# Patient Record
Sex: Female | Born: 1950
Health system: Southern US, Community
[De-identification: ages and names within clinical notes are randomized; demographics above are authoritative.]

## PROBLEM LIST (undated history)

## (undated) ENCOUNTER — Ambulatory Visit (HOSPITAL_COMMUNITY): Discharge status: Against Medical Advice | Payer: PPO

## (undated) DIAGNOSIS — K219 Gastro-esophageal reflux disease without esophagitis: Secondary | ICD-10-CM

## (undated) DIAGNOSIS — I639 Cerebral infarction, unspecified: Secondary | ICD-10-CM

## (undated) DIAGNOSIS — E119 Type 2 diabetes mellitus without complications: Secondary | ICD-10-CM

## (undated) DIAGNOSIS — E039 Hypothyroidism, unspecified: Secondary | ICD-10-CM

## (undated) DIAGNOSIS — I10 Essential (primary) hypertension: Secondary | ICD-10-CM

## (undated) DIAGNOSIS — F32A Depression, unspecified: Secondary | ICD-10-CM

## (undated) DIAGNOSIS — E109 Type 1 diabetes mellitus without complications: Secondary | ICD-10-CM

## (undated) DIAGNOSIS — G4733 Obstructive sleep apnea (adult) (pediatric): Secondary | ICD-10-CM

## (undated) DIAGNOSIS — F329 Major depressive disorder, single episode, unspecified: Secondary | ICD-10-CM

## (undated) HISTORY — DX: Gastro-esophageal reflux disease without esophagitis: K21.9

## (undated) HISTORY — DX: Depression, unspecified: F32.A

## (undated) HISTORY — PX: HAND SURGERY: SHX662

## (undated) HISTORY — PX: TIBIALIS TENDON TRANSFER / REPAIR: SHX6630

## (undated) HISTORY — DX: Obstructive sleep apnea (adult) (pediatric): G47.33

## (undated) HISTORY — PX: APPENDECTOMY: SHX54

## (undated) HISTORY — DX: Cerebral infarction, unspecified: I63.9

## (undated) HISTORY — DX: Major depressive disorder, single episode, unspecified: F32.9

## (undated) HISTORY — PX: JOINT REPLACEMENT: SHX530

## (undated) HISTORY — PX: TOTAL SHOULDER REPLACEMENT: SUR1217

---

## 1998-04-04 ENCOUNTER — Inpatient Hospital Stay (HOSPITAL_COMMUNITY): Admission: AD | Admit: 1998-04-04 | Discharge: 1998-04-14 | Payer: Self-pay | Admitting: *Deleted

## 1998-11-25 ENCOUNTER — Emergency Department (HOSPITAL_COMMUNITY): Admission: EM | Admit: 1998-11-25 | Discharge: 1998-11-25 | Payer: Self-pay | Admitting: Emergency Medicine

## 1998-11-27 ENCOUNTER — Encounter (HOSPITAL_COMMUNITY): Admission: RE | Admit: 1998-11-27 | Discharge: 1999-02-25 | Payer: Self-pay | Admitting: Specialist

## 1998-11-28 ENCOUNTER — Inpatient Hospital Stay (HOSPITAL_COMMUNITY): Admission: RE | Admit: 1998-11-28 | Discharge: 1998-12-02 | Payer: Self-pay | Admitting: Specialist

## 1999-10-06 ENCOUNTER — Inpatient Hospital Stay (HOSPITAL_COMMUNITY): Admission: EM | Admit: 1999-10-06 | Discharge: 1999-10-11 | Payer: Self-pay | Admitting: *Deleted

## 2011-05-21 ENCOUNTER — Inpatient Hospital Stay (HOSPITAL_COMMUNITY)
Admission: RE | Admit: 2011-05-21 | Discharge: 2011-05-23 | DRG: 219 | Disposition: A | Discharge status: Home / Self Care | Payer: BC Managed Care – PPO | Source: Ambulatory Visit | Attending: Orthopedic Surgery | Admitting: Orthopedic Surgery

## 2011-05-21 ENCOUNTER — Ambulatory Visit (HOSPITAL_COMMUNITY): Payer: BC Managed Care – PPO

## 2011-05-21 DIAGNOSIS — F341 Dysthymic disorder: Secondary | ICD-10-CM | POA: Diagnosis present

## 2011-05-21 DIAGNOSIS — S8253XA Displaced fracture of medial malleolus of unspecified tibia, initial encounter for closed fracture: Secondary | ICD-10-CM | POA: Diagnosis present

## 2011-05-21 DIAGNOSIS — E109 Type 1 diabetes mellitus without complications: Secondary | ICD-10-CM | POA: Diagnosis present

## 2011-05-21 DIAGNOSIS — E039 Hypothyroidism, unspecified: Secondary | ICD-10-CM | POA: Diagnosis present

## 2011-05-21 DIAGNOSIS — Z96659 Presence of unspecified artificial knee joint: Secondary | ICD-10-CM

## 2011-05-21 DIAGNOSIS — Y9301 Activity, walking, marching and hiking: Secondary | ICD-10-CM

## 2011-05-21 DIAGNOSIS — Z9641 Presence of insulin pump (external) (internal): Secondary | ICD-10-CM

## 2011-05-21 DIAGNOSIS — S82209A Unspecified fracture of shaft of unspecified tibia, initial encounter for closed fracture: Principal | ICD-10-CM | POA: Diagnosis present

## 2011-05-21 DIAGNOSIS — M810 Age-related osteoporosis without current pathological fracture: Secondary | ICD-10-CM | POA: Diagnosis present

## 2011-05-21 DIAGNOSIS — I10 Essential (primary) hypertension: Secondary | ICD-10-CM | POA: Diagnosis present

## 2011-05-21 DIAGNOSIS — Z794 Long term (current) use of insulin: Secondary | ICD-10-CM

## 2011-05-21 DIAGNOSIS — X58XXXA Exposure to other specified factors, initial encounter: Secondary | ICD-10-CM

## 2011-05-21 LAB — BASIC METABOLIC PANEL
BUN: 16 mg/dL (ref 6–23)
Chloride: 91 mEq/L — ABNORMAL LOW (ref 96–112)
GFR calc Af Amer: >60 mL/min (ref >60)
GFR calc non Af Amer: >60 mL/min (ref >60)
Glucose, Bld: 317 mg/dL — ABNORMAL HIGH (ref 70–99)
Potassium: 4.5 mEq/L (ref 3.5–5.1)
Sodium: 128 mEq/L — ABNORMAL LOW (ref 135–145)

## 2011-05-21 LAB — SURGICAL PCR SCREEN
MRSA, PCR: NEGATIVE
Staphylococcus aureus: POSITIVE — AB

## 2011-05-21 LAB — GLUCOSE, CAPILLARY
Glucose-Capillary: 340 mg/dL — ABNORMAL HIGH (ref 70–99)
Glucose-Capillary: 457 mg/dL — ABNORMAL HIGH (ref 70–99)
Glucose-Capillary: 319 mg/dL — ABNORMAL HIGH (ref 70–99)

## 2011-05-21 LAB — CBC
Hemoglobin: 12.9 g/dL (ref 12.0–15.0)
MCH: 31.9 pg (ref 26.0–34.0)
MCV: 89.1 fL (ref 78.0–100.0)
RBC: 4.04 MIL/uL (ref 3.87–5.11)
WBC: 8.8 10*3/uL (ref 4.0–10.5)

## 2011-05-22 LAB — GLUCOSE, CAPILLARY
Glucose-Capillary: 116 mg/dL — ABNORMAL HIGH (ref 70–99)
Glucose-Capillary: 211 mg/dL — ABNORMAL HIGH (ref 70–99)
Glucose-Capillary: 35 mg/dL — CL (ref 70–99)
Glucose-Capillary: 405 mg/dL — ABNORMAL HIGH (ref 70–99)
Glucose-Capillary: 407 mg/dL — ABNORMAL HIGH (ref 70–99)
Glucose-Capillary: 450 mg/dL — ABNORMAL HIGH (ref 70–99)
Glucose-Capillary: 456 mg/dL — ABNORMAL HIGH (ref 70–99)
Glucose-Capillary: 487 mg/dL — ABNORMAL HIGH (ref 70–99)

## 2011-05-22 LAB — CBC
Hemoglobin: 11.1 g/dL — ABNORMAL LOW (ref 12.0–15.0)
MCH: 30.7 pg (ref 26.0–34.0)
MCHC: 34 g/dL (ref 30.0–36.0)
Platelets: 349 10*3/uL (ref 150–400)
RDW: 11.9 % (ref 11.5–15.5)

## 2011-05-22 LAB — BASIC METABOLIC PANEL
BUN: 12 mg/dL (ref 6–23)
Calcium: 8.6 mg/dL (ref 8.4–10.5)
Chloride: 102 mEq/L (ref 96–112)
Creatinine, Ser: 0.76 mg/dL (ref 0.50–1.10)
GFR calc Af Amer: >60 mL/min (ref >60)
GFR calc non Af Amer: >60 mL/min (ref >60)

## 2011-05-22 LAB — MAGNESIUM: Magnesium: 2.1 mg/dL (ref 1.5–2.5)

## 2011-05-22 LAB — HEMOGLOBIN A1C: Mean Plasma Glucose: 220 mg/dL — ABNORMAL HIGH (ref <117)

## 2011-05-23 LAB — CBC
Hemoglobin: 11.2 g/dL — ABNORMAL LOW (ref 12.0–15.0)
MCH: 31 pg (ref 26.0–34.0)
MCHC: 34.3 g/dL (ref 30.0–36.0)

## 2011-05-23 LAB — BASIC METABOLIC PANEL
BUN: 6 mg/dL (ref 6–23)
Calcium: 9.1 mg/dL (ref 8.4–10.5)
GFR calc non Af Amer: >60 mL/min (ref >60)
Glucose, Bld: 129 mg/dL — ABNORMAL HIGH (ref 70–99)
Sodium: 137 mEq/L (ref 135–145)

## 2011-05-23 LAB — GLUCOSE, CAPILLARY: Glucose-Capillary: 131 mg/dL — ABNORMAL HIGH (ref 70–99)

## 2011-05-30 NOTE — Consult Note (Signed)
NAME:  [name], FISCUS NO.:  [id number]  MEDICAL RECORD NO.:  [id number]  LOCATION:  5022                         FACILITY:  [facility]  PHYSICIAN:  Jonny Ruiz, MD    DATE OF BIRTH:  [birth year]  DATE OF CONSULTATION: DATE OF DISCHARGE:                                CONSULTATION   REFERRING PHYSICIAN:  Toni Arthurs, MD  PRIMARY CARE PHYSICIAN:  Dr.  Brent Bulla in [place], [place].  REASON FOR CONSULTATION:  Management of diabetes.  HISTORY OF PRESENT ILLNESS:  The patient is a 60 year old white female with history of diabetes mellitus type 1, managed by an insulin pump at home, with last hemoglobin A1c of 9.1 a couple of weeks ago, who was admitted to the hospital for ORIF of left distal tibia.  Preoperatively, the patient was noted to have hyperglycemia with ranging between 300 and up to 499.  The patient feels well and voices no complaints.  She states that she received an intraarticular corticosteroid injection to the right knee which has made her glucose worse.  She contacted her general practitioner who asked her to double the dose of insulin coverage.  PAST MEDICAL HISTORY: 1. Diabetes mellitus type 1, on an insulin pump for the last 50 years. 2. Hypertension. 3. Hypothyroidism. 4. Depression with anxiety.  CURRENT MEDICATIONS: 1. Amlodipine 5 mg daily. 2. Cefazolin 1 g IV q.6. 3. Colace 100 mg p.o. b.i.d. 4. Dilaudid p.r.n. 5. Insulin pump. 6. Synthroid 125 mcg a day. 7. Lisinopril 20 mg b.i.d. 8. Senna 1 tablet b.i.d. 9. Effexor 75 mg daily. 10.Acetaminophen. 11.Ativan 0.5 mg p.o. b.i.d. p.r.n. 12.Reglan 5 mg p.o. t.i.d. p.r.n. 13.Oxycodone 10 mg p.o. q.4 p.r.n. 14.Compazine 5-10 mg p.o. q.6 p.r.n.  ALLERGIES:  She has no known drug allergies.  SURGICAL HISTORY:  Appendectomy, bilateral carpal tunnel, right knee arthroscopy, right knee replacement, bilateral cataract surgery.  FAMILY HISTORY:  Noncontributory.  SOCIAL  HISTORY:  The patient is divorced and her 62 year old son lives with her.  She drinks 1 glass of wine a day.  She denies tobacco.  Her last drink was May 19, 2011.  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  REVIEW OF SYSTEMS:  CONSTITUTIONAL:  She denied fever, chills, night sweats.  CARDIOVASCULAR:  She denies chest pain, shortness of breath, or palpitations.  RESPIRATORY:  She denies cough, wheezes, hemoptysis, or trouble breathing.  GASTROINTESTINAL:  Denies abdominal pain, nausea, vomiting, diarrhea, constipation, hematochezia, or melena. GENITOURINARY:  Denies dysuria, frequency, hematuria, or vaginal discharge.  PHYSICAL EXAMINATION:  VITAL SIGNS:  Temperature 98.4, pulse 90, respirations 18, BP 130/70, pulse oximetry 100% on room air. GENERAL APPEARANCE:  The patient is a thin Caucasian female who appears in no acute distress. HEENT:  Unremarkable. NECK:  Supple without JVD or lymphadenopathy. HEART:  Regular S1 and S2 without gallops, murmurs, or rubs. LUNGS:  Clear to auscultation. ABDOMEN:  Soft and nontender without organomegaly or masses palpable. NEUROLOGIC:  Nonfocal. EXTREMITIES:  No clubbing, cyanosis, or edema.  Left lower leg is on a cast.  LABORATORY DATA:  Sodium 128, potassium 4.5, chloride 91, CO2 26, glucose 317, BUN 16, creatinine 0.8.  WBC is 8.8, hemoglobin  12.9, hematocrit  36.0, MCV 89, platelet count 382.  EKG, normal sinus rhythm at 89 beats per minute with possible  left atrial enlargement.  There is a small Q-wave in lead III and aVF, possible inferior infarct, age undetermined.  No ST or T wave abnormalities.  Poor R-wave progression.  RECOMMENDATIONS:  This pleasant 60 year old white female with diabetes type 1 on an insulin pump, is exhibiting uncontrolled hyperglycemia due to recent corticosteroid intraarticular injection and the current surgical stress.  Given her slight hypernatremia, we will recommend to hydrate the patient with  normal saline, replace potassium as needed, and continue insulin pump at the current setting.  In addition, she will be covered by insulin sliding scale per protocol.  Thanks for letting me to participate in the care of your patient.          ______________________________ Jonny Ruiz, MD     GL/MEDQ  D:  05/21/2011  T:  05/21/2011  Job:  [id number]  Electronically Signed by Jonny Ruiz MD on 05/30/2011 04:49:27 PM

## 2011-05-31 NOTE — Op Note (Signed)
NAME:  [name], ALTHOUSE NO.:  [id number]  MEDICAL RECORD NO.:  [id number]  LOCATION:  5022                         FACILITY:  [facility]  PHYSICIAN:  Toni Arthurs, MD        DATE OF BIRTH:  1951-03-06  DATE OF PROCEDURE:  05/21/2011 DATE OF DISCHARGE:                              OPERATIVE REPORT   PREOPERATIVE DIAGNOSIS:  Left tibial pilon and left fibular fractures.  POSTOPERATIVE DIAGNOSIS:  Left tibial pilon and left fibular fractures.  PROCEDURES: 1. Open reduction and internal fixation of the left tibial pilon     fracture. 2. Open reduction and internal fixation of left fibular fracture. 3. Open reduction and internal fixation of left medial malleolus     fracture. 4. Intraoperative interpretation of fluoroscopic images greater than 1     hour. 5. Intraoperative stress examination of the left ankle.  SURGEON:  Toni Arthurs, MD  ANESTHESIA:  General.  IV FLUIDS:  See anesthesia record.  ESTIMATED BLOOD LOSS:  Minimal.  TOURNIQUET TIME:  1 hour and 49 minutes at 250 mmHg.  COMPLICATIONS:  None apparent.  DISPOSITION:  Extubated, awake and stable to recovery.  INDICATIONS FOR PROCEDURE:  The patient is a 60 year old female with a past medical history significant for type 1 diabetes.  She fractured her distal tibia and fibula while hiking approximately a week ago.  She has had a CT scan which shows an intra-articular fracture of the distal tibial plafond as well as a fracture of the fibula.  She presents now for open reduction and internal fixation of this tibial pilon fracture and the adjacent fibular fracture.  The CT scan also reveals a medial malleolus fracture that is separate from the other two fractures.  She presents now for open reduction and internal fixation of these three fractures.  She understands the risks and benefits of the procedures as well as the alternative treatment options.  She specifically understands risks of bleeding,  infection, nerve damage, blood clots, need for additional surgery, amputation, and death.  She would like to proceed.  After preoperative consent was obtained and the correct operative site was identified, the patient was brought to the operating room and placed supine on the operating table.  General anesthesia was induced. Preoperative antibiotics were administered.  A surgical time-out was taken.  The left lower extremity was prepped and draped in a standard sterile fashion with a tourniquet on the thigh.  The patient was positioned in a partially lateral decubitus position with all the bony prominences well padded.  A longitudinal incision was marked at the posterolateral aspect of the ankle at the interval between the peroneals and the Achilles.  The extremity was exsanguinated and tourniquet was inflated to 250 mmHg.  Previously marked incision was made and sharp dissection was carried down through the skin.  Blunt dissection was carried down through the subcutaneous tissue taking care to protect the sural nerve.  The peroneus longus tendon was identified.  The fascia overlying the tendon was incised.  The interval between the peroneus longus and flexor hallucis longus was identified.  Blunt dissection was carried down through this interval to the posterior aspect of the tibia. The  fracture site was identified.  Periosteum was incised over the fracture site.  A Freer elevator was inserted in the fracture site and used to mobilize the fracture.  A stab incision was made at the anterior aspect of the ankle just lateral from the tibialis anterior tendon.  A hemostat was used to spread down to the level of the bone.  The Weber tenaculum was then inserted through this incision anteriorly into the posterior incision after the fracture was reduced.  The clamp was applied securing the fracture.  AP and lateral views showed that the fracture was reduced appropriately.  A 1.6-mm threaded  K-wire from the DePuy 4-mm cannulated screw set was selected and was inserted from posterior to anterior across the fracture site.  This was measured, a partially threaded 4.0 cannulated screw was then inserted and was noted to have excellent purchase.  The tenaculum was removed.  A three-hole one-third tubular plate was then placed over the apex of the fracture site.  The proximal hole was drilled and filled with a bicortical 3.5-mm fully-threaded cortical screw.  The distal hole was drilled and filled with a 3.5-mm fully-threaded cortical screw in lag fashion.  Both screws were noted to have excellent purchase.  The center hole was drilled and filled with a 3.5-mm fully-threaded cortical screw as well.  Attention was then turned to the posterior aspect of the fibula.  Just anterior to the peroneals, submuscular dissection was carried down to the level of the fibula.  A full-thickness flap was then elevated off of the fibula laterally.  The fracture was identified.  It was mobilized and cleaned of all hematoma.  The fracture was reduced and held with a lobster claw, a 3.5-mm fully-threaded screw was inserted in lag fashion from posterior to anterior perpendicular to the fracture line.  The one- third tubular plate seven holes in length was then contoured to fit the lateral aspect of the fibula.  It was applied and secured proximally with three fully-threaded bicortical screws.  Distally, it was secured with three unicortical fully-threaded 3.5-mm cortical screws.  The distal-most two screws were placed through stab incisions and the third most distal screw was placed through the primary incision.  All three were noted to have appropriate purchase.  At this point, mortise view was obtained.  The fracture was noted to be reduced appropriately.  The screws were noted to be of the appropriate length.  A mortise view was obtained, and under live fluoroscopy, dorsiflexion with  external rotation stress was applied to the ankle.  There was no displacement noted at the syndesmosis or widening at the medial clear space.  At this point, attention was then turned to the medial malleolus fracture.  A 1.6-mm threaded K-wire was inserted through the tip of the medial malleolus and across the fracture site.  A stab incision was made over this pin.  The tip of the medial malleolus was drilled with a 2.9- mm cannulated drill bit.  A 44 mm x 4-mm partially threaded cannulated screw was then inserted through the tip of the medial malleolus and advanced until it achieved adequate purchase.  The guidewire was removed.  Final AP, mortise, and lateral views of the ankle showed appropriate reduction of all three fractures as well as appropriate position and length of all hardware.  The posterolateral wound was irrigated copiously.  Inverted simple sutures of 3-0 Monocryl were used to close the subcutaneous tissue.  A running 3-0 Prolene suture was used to close the skin  incision.  The remaining incisions were all closed with horizontal mattress sutures of 3-0 Prolene after irrigating the wounds.  Sterile dressings were applied followed by a well-padded short- leg splint.  The tourniquet was released at an hour and forty-nine minutes after application of the splint.  The patient was then awakened from anesthesia and transported to the recovery room in stable condition.  FOLLOWUP PLAN:  The patient will be nonweightbearing on the left lower extremity.  She will be seen by Dr. Jordan Hawks [facility] due to her persistent hyperglycemia intraoperatively.     Toni Arthurs, MD     JH/MEDQ  D:  05/21/2011  T:  05/22/2011  Job:  [id number]  Electronically Signed by Jonny Ruiz Kathleen Tamm  on 05/31/2011 08:11:21 AM

## 2011-05-31 NOTE — Discharge Summary (Signed)
  NAMEMarland Andrews  [name], [name] NO.:  [id number]  MEDICAL RECORD NO.:  [id number]  LOCATION:  5022                         FACILITY:  [facility]  PHYSICIAN:  Toni Arthurs, MD        DATE OF BIRTH:  March 15, 1951  DATE OF ADMISSION:  05/21/2011 DATE OF DISCHARGE:  05/23/2011                              DISCHARGE SUMMARY   ADMISSION DIAGNOSES: 1. Left tibial pilon and fibula displaced fractures. 2. Type 1 diabetes. 3. Osteoporosis. 4. Hypothyroidism. 5. Hypertension. 6. Anxiety. 7. Depression.  DISCHARGE DIAGNOSES: 1. Left distal tibial pilon and fibular fracture, status post open     reduction and internal fixation. 2. Type 1 diabetes. 3. Osteoporosis. 4. Hypothyroidism. 5. Hypertension. 6. Anxiety. 7. Depression.  BRIEF ADMISSION HISTORY:  The patient is a 60 year old female with past medical history significant for type 1 diabetes and hypothyroidism who injured her left ankle hiking approximately a week prior to admission. X-rays and CT scan revealed a left tibial pilon and fibular fracture. She presents on this admission for open reduction and internal fixation of this complex fracture pattern.  HOSPITAL COURSE:  The patient was admitted to hospital on May 21, 2011. She was taken to the operating room the same day where she underwent open reduction and internal fixation of her left tibial pilon fracture as well as her left fibula fracture.  She tolerated these procedures well with the exception of persistent hyperglycemia with blood sugar in the 400s throughout the case.  As a result of this hyperglycemia, a consultation was obtained from the [employer]  She continued having relatively high blood sugars until her insulin pump cannula site was changed.  After that time, her blood sugar normalized back to her baseline.  She remained stable for duration of her hospital stay.  She was discharged to home in stable condition on May 23, 2011.  DISCHARGE CONDITION:  Stable.  WEIGHTBEARING STATUS:  Nonweightbearing on the left lower extremity.  DISCHARGE DIET:  Diabetic diet.  FOLLOWUP PLAN:  The patient will be nonweightbearing on left lower extremity.  She will follow up with me in 2 weeks for suture removal and conversion to have a cast.  She will keep her foot elevated to the toes above the nose as much as possible for the next 3 days.  DISCHARGE MEDICATIONS:  See discharge medication reconciliation.     Toni Arthurs, MD     JH/MEDQ  D:  05/23/2011  T:  05/23/2011  Job:  [id number]  Electronically Signed by Jonny Ruiz Annali Lybrand  on 05/31/2011 08:11:18 AM

## 2014-04-15 DIAGNOSIS — I10 Essential (primary) hypertension: Secondary | ICD-10-CM

## 2014-04-15 DIAGNOSIS — E1159 Type 2 diabetes mellitus with other circulatory complications: Secondary | ICD-10-CM

## 2014-04-15 DIAGNOSIS — I152 Hypertension secondary to endocrine disorders: Secondary | ICD-10-CM | POA: Insufficient documentation

## 2014-04-15 DIAGNOSIS — E119 Type 2 diabetes mellitus without complications: Secondary | ICD-10-CM | POA: Insufficient documentation

## 2014-04-15 HISTORY — DX: Type 2 diabetes mellitus with other circulatory complications: E11.59

## 2014-04-15 HISTORY — DX: Essential (primary) hypertension: I10

## 2014-04-18 DIAGNOSIS — M1710 Unilateral primary osteoarthritis, unspecified knee: Secondary | ICD-10-CM

## 2014-04-18 HISTORY — DX: Unilateral primary osteoarthritis, unspecified knee: M17.10

## 2014-04-20 DIAGNOSIS — G9340 Encephalopathy, unspecified: Secondary | ICD-10-CM | POA: Insufficient documentation

## 2014-04-20 DIAGNOSIS — J9601 Acute respiratory failure with hypoxia: Secondary | ICD-10-CM | POA: Insufficient documentation

## 2014-04-20 HISTORY — DX: Acute respiratory failure with hypoxia: J96.01

## 2015-08-14 DIAGNOSIS — J45909 Unspecified asthma, uncomplicated: Secondary | ICD-10-CM | POA: Insufficient documentation

## 2015-08-14 DIAGNOSIS — J310 Chronic rhinitis: Secondary | ICD-10-CM | POA: Insufficient documentation

## 2015-08-14 DIAGNOSIS — K219 Gastro-esophageal reflux disease without esophagitis: Secondary | ICD-10-CM | POA: Insufficient documentation

## 2015-08-14 HISTORY — DX: Unspecified asthma, uncomplicated: J45.909

## 2015-08-16 ENCOUNTER — Encounter: Payer: Self-pay | Admitting: Allergy and Immunology

## 2015-08-16 ENCOUNTER — Ambulatory Visit (INDEPENDENT_AMBULATORY_CARE_PROVIDER_SITE_OTHER): Payer: BC Managed Care – PPO | Admitting: Allergy and Immunology

## 2015-08-16 VITALS — BP 134/72 | HR 80 | Resp 14

## 2015-08-16 DIAGNOSIS — J3089 Other allergic rhinitis: Secondary | ICD-10-CM | POA: Diagnosis not present

## 2015-08-16 DIAGNOSIS — J387 Other diseases of larynx: Secondary | ICD-10-CM

## 2015-08-16 DIAGNOSIS — J4540 Moderate persistent asthma, uncomplicated: Secondary | ICD-10-CM

## 2015-08-16 DIAGNOSIS — K219 Gastro-esophageal reflux disease without esophagitis: Secondary | ICD-10-CM

## 2015-08-16 MED ORDER — MOMETASONE FUROATE 100 MCG/ACT IN AERO: 2.0000 | INHALATION_SPRAY | Freq: Two times a day (BID) | RESPIRATORY_TRACT | Status: DC

## 2015-08-16 NOTE — Progress Notes (Signed)
FOLLOW UP NOTE  RE: [name] MRN: [id number] Date of Birth: 1951/09/04 [facility] and [place] is a 64 y.o. female with a history of Asthma, LPR, and Allergic rhinitis who was doing well until the past several weeks when she developed cough, especially at night, and also has had some throat clearing and post nasal drip. She has not had any fever or significant rhinitis or ugly nasal discharge or any chest tightness or sob. She continues to drink one coffee per day and one glass of wine per night. All of thee symptoms developed while using her Omeprazole and ranitidine and nasonex. She does not use her SABA  CURRENT MEDICAL TREATMENT  Current outpatient prescriptions:  .  Albuterol Sulfate (PROAIR RESPICLICK) 865 (90 BASE) MCG/ACT AEPB, Inhale 2 puffs into the lungs as needed (for cough or wheeze)., Disp: , Rfl:  .  ALISKIREN-AMLODIPINE PO, Take by mouth., Disp: , Rfl:  .  LEVOTHYROXINE SODIUM PO, Take by mouth., Disp: , Rfl:  .  omeprazole (PRILOSEC) 40 MG capsule, Take 40 mg by mouth daily., Disp: , Rfl:  .  Probiotic Product (PROBIOTIC PO), Take by mouth., Disp: , Rfl:  .  ranitidine (ZANTAC) 300 MG tablet, Take 300 mg by mouth at bedtime., Disp: , Rfl:  .  SIMVASTATIN PO, Take by mouth., Disp: , Rfl:  .  VENLAFAXINE HCL PO, Take by mouth., Disp: , Rfl:  .  mometasone (NASONEX) 50 MCG/ACT nasal spray, Place 1 spray into the nose daily., Disp: , Rfl:  .  Mometasone Furoate (ASMANEX HFA) 100 MCG/ACT AERO, Inhale 2 puffs into the lungs 2 (two) times daily. Rinse, gargle, and spit after use, Disp: 13 g, Rfl: 5  DRUG ALLERGY: Allergies as of 08/16/2015  . (No Known Allergies)    PHYSICAL EXAM: BP 134/72 mmHg  Pulse 80  Resp 14 Physical Exam  Constitutional: She appears well-developed. No distress.  HENT:  Head: Normocephalic.  Right Ear: External ear normal.  Left Ear: External ear normal.  Nose: Nose normal.  Mouth/Throat: Oropharynx is clear and  moist. No oropharyngeal exudate.  Eyes: Conjunctivae are normal. Right eye exhibits no discharge. Left eye exhibits no discharge.  Neck: No JVD present. No tracheal deviation present. No thyromegaly present.  Cardiovascular: Normal rate, regular rhythm and normal heart sounds.  Exam reveals no gallop and no friction rub.   No murmur heard. Pulmonary/Chest: No stridor. No respiratory distress. She has no wheezes. She has no rales. She exhibits no tenderness.  Musculoskeletal: She exhibits no edema.  Lymphadenopathy:    She has no cervical adenopathy.  Skin: She is not diaphoretic.    DIAGNOSTICS: Spirometry was performed and demonstrated a FEV of 1.89 @ 81%  ASSESSMENT AND PLAN:  Asthma LPR Allergic rhinitis   1. Start Asmanex 100 HFA 2 puffs two times per day 2. Start OTC Rhinocort one spray each nostril one time per day 3. Continue Omeprazole and Ranitidine 4. Use the following if needed:  A. OTC antihistamine  B. Proair HFA Respiclick 5. Get flu vaccine 6. Return in 6 weeks or earlier if problem.  I will start Dewana on steroids for both her upper and lower respiratory tract for the next 6 weeks to see if this helps her respiratory symptoms. We will regroup at that point or earlier if there is a problem.

## 2015-08-16 NOTE — Patient Instructions (Signed)
1. Start Asmanex 100 HFA 2 puffs two times per day  2. Start OTC Rhinocort one spray each nostril one time per day  3. Continue Omeprazole and Ranitidine  4. Use the following if needed:  A. OTC antihistamine  B. Proair HFA Respiclick  5. Get flu vaccine  6. Return in 6 weeks or earlier if problem.

## 2015-09-22 ENCOUNTER — Other Ambulatory Visit: Payer: Self-pay | Admitting: *Deleted

## 2015-09-22 MED ORDER — BECLOMETHASONE DIPROPIONATE 80 MCG/ACT IN AERS: INHALATION_SPRAY | RESPIRATORY_TRACT | Status: DC

## 2015-09-22 NOTE — Telephone Encounter (Signed)
Asmanex changed to Qvar 80 2-QD per Dr. Neldon Mc.

## 2015-10-04 ENCOUNTER — Ambulatory Visit: Payer: BC Managed Care – PPO | Admitting: Allergy and Immunology

## 2015-11-06 ENCOUNTER — Other Ambulatory Visit: Payer: Self-pay

## 2015-11-06 MED ORDER — RANITIDINE HCL 300 MG PO TABS: 300.0000 mg | ORAL_TABLET | Freq: Every day | ORAL | Status: DC

## 2015-11-06 MED ORDER — OMEPRAZOLE 40 MG PO CPDR: 40.0000 mg | DELAYED_RELEASE_CAPSULE | Freq: Every day | ORAL | Status: DC

## 2016-01-17 ENCOUNTER — Other Ambulatory Visit: Payer: Self-pay | Admitting: Gastroenterology

## 2016-01-17 DIAGNOSIS — R6881 Early satiety: Secondary | ICD-10-CM

## 2016-01-25 ENCOUNTER — Ambulatory Visit
Admission: RE | Admit: 2016-01-25 | Discharge: 2016-01-25 | Disposition: A | Discharge status: Home / Self Care | Payer: BC Managed Care – PPO | Source: Ambulatory Visit | Attending: Gastroenterology | Admitting: Gastroenterology

## 2016-01-25 DIAGNOSIS — R6881 Early satiety: Secondary | ICD-10-CM | POA: Insufficient documentation

## 2016-01-25 MED ORDER — TECHNETIUM TC 99M SULFUR COLLOID
2.0000 | Freq: Once | INTRAVENOUS | Status: AC | PRN
  Administered 2016-01-25: 2.03 via INTRAVENOUS

## 2016-02-01 ENCOUNTER — Encounter: Payer: Self-pay | Admitting: *Deleted

## 2016-02-02 ENCOUNTER — Encounter
Admission: RE | Disposition: A | Discharge status: Home / Self Care | Payer: Self-pay | Source: Ambulatory Visit | Attending: Gastroenterology

## 2016-02-02 ENCOUNTER — Ambulatory Visit
Admission: RE | Admit: 2016-02-02 | Discharge: 2016-02-02 | Disposition: A | Discharge status: Home / Self Care | Payer: BC Managed Care – PPO | Source: Ambulatory Visit | Attending: Gastroenterology | Admitting: Gastroenterology

## 2016-02-02 ENCOUNTER — Encounter: Payer: Self-pay | Admitting: Anesthesiology

## 2016-02-02 ENCOUNTER — Ambulatory Visit: Payer: BC Managed Care – PPO | Admitting: Anesthesiology

## 2016-02-02 DIAGNOSIS — K2950 Unspecified chronic gastritis without bleeding: Secondary | ICD-10-CM | POA: Diagnosis not present

## 2016-02-02 DIAGNOSIS — Z800 Family history of malignant neoplasm of digestive organs: Secondary | ICD-10-CM | POA: Diagnosis not present

## 2016-02-02 DIAGNOSIS — K449 Diaphragmatic hernia without obstruction or gangrene: Secondary | ICD-10-CM | POA: Insufficient documentation

## 2016-02-02 DIAGNOSIS — I10 Essential (primary) hypertension: Secondary | ICD-10-CM | POA: Diagnosis not present

## 2016-02-02 DIAGNOSIS — E039 Hypothyroidism, unspecified: Secondary | ICD-10-CM | POA: Insufficient documentation

## 2016-02-02 DIAGNOSIS — R140 Abdominal distension (gaseous): Secondary | ICD-10-CM | POA: Diagnosis not present

## 2016-02-02 DIAGNOSIS — Z8371 Family history of colonic polyps: Secondary | ICD-10-CM | POA: Insufficient documentation

## 2016-02-02 DIAGNOSIS — E119 Type 2 diabetes mellitus without complications: Secondary | ICD-10-CM | POA: Insufficient documentation

## 2016-02-02 DIAGNOSIS — Z96659 Presence of unspecified artificial knee joint: Secondary | ICD-10-CM | POA: Diagnosis not present

## 2016-02-02 DIAGNOSIS — R6881 Early satiety: Secondary | ICD-10-CM | POA: Insufficient documentation

## 2016-02-02 HISTORY — DX: Type 2 diabetes mellitus without complications: E11.9

## 2016-02-02 HISTORY — PX: ESOPHAGOGASTRODUODENOSCOPY (EGD) WITH PROPOFOL: SHX5813

## 2016-02-02 HISTORY — DX: Hypothyroidism, unspecified: E03.9

## 2016-02-02 HISTORY — DX: Essential (primary) hypertension: I10

## 2016-02-02 LAB — GLUCOSE, CAPILLARY: Glucose-Capillary: 110 mg/dL — ABNORMAL HIGH (ref 65–99)

## 2016-02-02 SURGERY — ESOPHAGOGASTRODUODENOSCOPY (EGD) WITH PROPOFOL
Anesthesia: General

## 2016-02-02 MED ORDER — PROPOFOL 500 MG/50ML IV EMUL
INTRAVENOUS | Status: DC | PRN
  Administered 2016-02-02: 100 ug/kg/min via INTRAVENOUS

## 2016-02-02 MED ORDER — IPRATROPIUM-ALBUTEROL 0.5-2.5 (3) MG/3ML IN SOLN
3.0000 mL | Freq: Once | RESPIRATORY_TRACT | Status: AC
  Administered 2016-02-02: 3 mL via RESPIRATORY_TRACT

## 2016-02-02 MED ORDER — IPRATROPIUM-ALBUTEROL 0.5-2.5 (3) MG/3ML IN SOLN
RESPIRATORY_TRACT | Status: AC
  Filled 2016-02-02: qty 3

## 2016-02-02 MED ORDER — SODIUM CHLORIDE 0.9 % IV SOLN
INTRAVENOUS | Status: DC
  Administered 2016-02-02: 1000 mL via INTRAVENOUS

## 2016-02-02 NOTE — Anesthesia Postprocedure Evaluation (Signed)
Anesthesia Post Note  Patient: [name]  Procedure(s) Performed: Procedure(s) (LRB): ESOPHAGOGASTRODUODENOSCOPY (EGD) WITH PROPOFOL (N/A)  Patient location during evaluation: Endoscopy Anesthesia Type: General Level of consciousness: awake and alert Pain management: pain level controlled Vital Signs Assessment: post-procedure vital signs reviewed and stable Respiratory status: spontaneous breathing, nonlabored ventilation, respiratory function stable and patient connected to nasal cannula oxygen Cardiovascular status: blood pressure returned to baseline and stable Postop Assessment: no signs of nausea or vomiting Anesthetic complications: no    Last Vitals:  Filed Vitals:   02/02/16 0945 02/02/16 0950  BP: 114/102 154/85  Pulse: 81 81  Temp:    Resp: 13 14    Last Pain: There were no vitals filed for this visit.               Precious Haws [place]

## 2016-02-02 NOTE — Anesthesia Preprocedure Evaluation (Addendum)
Anesthesia Evaluation  Patient identified by MRN, date of birth, ID band Patient awake    Reviewed: Allergy & Precautions, H&P , NPO status , Patient's Chart, lab work & pertinent test results  History of Anesthesia Complications Negative for: history of anesthetic complications  Airway Mallampati: III  TM Distance: >3 FB Neck ROM: limited    Dental  (+) Poor Dentition   Pulmonary neg shortness of breath, asthma ,    Pulmonary exam normal breath sounds clear to auscultation       Cardiovascular Exercise Tolerance: Good hypertension, (-) angina(-) Past MI and (-) DOE Normal cardiovascular exam Rhythm:regular Rate:Normal     Neuro/Psych TIAnegative psych ROS   GI/Hepatic Neg liver ROS, GERD  Controlled,  Endo/Other  diabetes, Type 2Hypothyroidism   Renal/GU negative Renal ROS  negative genitourinary   Musculoskeletal   Abdominal   Peds  Hematology negative hematology ROS (+)   Anesthesia Other Findings Past Medical History:   Diabetes mellitus without complication (HCC)                 Hypertension                                                 Hypothyroidism                                              Past Surgical History:   JOINT REPLACEMENT                                               Comment:Knee    APPENDECTOMY                                                  CESAREAN SECTION                                             BMI    Body Mass Index   24.87 kg/m 2      Reproductive/Obstetrics negative OB ROS                            Anesthesia Physical Anesthesia Plan  ASA: III  Anesthesia Plan: General   Post-op Pain Management:    Induction:   Airway Management Planned:   Additional Equipment:   Intra-op Plan:   Post-operative Plan:   Informed Consent: I have reviewed the patients History and Physical, chart, labs and discussed the procedure including the risks,  benefits and alternatives for the proposed anesthesia with the patient or authorized representative who has indicated his/her understanding and acceptance.   Dental Advisory Given  Plan Discussed with: Anesthesiologist, CRNA and Surgeon  Anesthesia Plan Comments:         Anesthesia Quick Evaluation

## 2016-02-02 NOTE — Transfer of Care (Signed)
Immediate Anesthesia Transfer of Care Note  Patient: [name]  Procedure(s) Performed: Procedure(s): ESOPHAGOGASTRODUODENOSCOPY (EGD) WITH PROPOFOL (N/A)  Patient Location: PACU  Anesthesia Type:General  Level of Consciousness: awake, alert  and oriented  Airway & Oxygen Therapy: Patient Spontanous Breathing and Patient connected to nasal cannula oxygen  Post-op Assessment: Report given to RN and Post -op Vital signs reviewed and stable  Post vital signs: Reviewed and stable  Last Vitals:  Filed Vitals:   02/02/16 0819 02/02/16 0931  BP: 134/76   Pulse: 81 79  Temp: 36 C 36 C  Resp: 16 15    Complications: No apparent anesthesia complications

## 2016-02-02 NOTE — H&P (Signed)
Primary Care Physician:  Rochel Brome, MD  Pre-Procedure History & Physical: HPI:  [name] is a 65 y.o. female is here for an endoscopy.   Past Medical History  Diagnosis Date  . Diabetes mellitus without complication ([place])   . Hypertension   . Hypothyroidism     Past Surgical History  Procedure Laterality Date  . Joint replacement      Knee   . Appendectomy    . Cesarean section      Prior to Admission medications   Medication Sig Start Date End Date Taking? Authorizing Provider  Albuterol Sulfate (PROAIR RESPICLICK) [id number] (90 BASE) MCG/ACT AEPB Inhale 2 puffs into the lungs as needed (for cough or wheeze).   Yes Historical Provider, MD  ALISKIREN-AMLODIPINE PO Take by mouth.   Yes Historical Provider, MD  aspirin EC 81 MG tablet Take 81 mg by mouth daily.   Yes Historical Provider, MD  beclomethasone (QVAR) 80 MCG/ACT inhaler INHALE TWO PUFFS ONCE DAILY TO PREVENT COUGH OR WHEEZE. RINSE, GARGLE, AND SPIT AFTER USE. 09/22/15  Yes Jiles Prows, MD  insulin aspart (NOVOLOG) 100 UNIT/ML injection Inject subcutaneously. SLIDING SCALE   Yes Historical Provider, MD  LEVOTHYROXINE SODIUM PO Take 25 mcg by mouth.    Yes Historical Provider, MD  lisinopril (PRINIVIL,ZESTRIL) 5 MG tablet Take 5 mg by mouth daily.   Yes Historical Provider, MD  LORazepam (ATIVAN) 0.5 MG tablet Take 0.5 mg by mouth. As needed for anxiety   Yes Historical Provider, MD  mometasone (NASONEX) 50 MCG/ACT nasal spray Place 1 spray into the nose daily.   Yes Historical Provider, MD  Mometasone Furoate [facility] HFA) 100 MCG/ACT AERO Inhale 2 puffs into the lungs 2 (two) times daily. Rinse, gargle, and spit after use 08/16/15  Yes Jiles Prows, MD  omeprazole (PRILOSEC) 40 MG capsule Take 1 capsule (40 mg total) by mouth daily. 11/06/15  Yes Jiles Prows, MD  Probiotic Product (PROBIOTIC PO) Take by mouth.   Yes Historical Provider, MD  ranitidine (ZANTAC) 300 MG tablet Take 1 tablet (300 mg total) by mouth at  bedtime. 11/06/15  Yes Jiles Prows, MD  SIMVASTATIN PO Take 5 mg by mouth.    Yes Historical Provider, MD  tolterodine (DETROL LA) 4 MG 24 hr capsule Take 4 mg by mouth daily.   Yes Historical Provider, MD  VENLAFAXINE HCL PO Take 25 mg by mouth.    Yes Historical Provider, MD  Vitamin D, Ergocalciferol, (DRISDOL) 50000 units CAPS capsule Take 50,000 Units by mouth every 7 (seven) days.   Yes Historical Provider, MD  insulin glargine (LANTUS) 100 UNIT/ML injection Inject 15 Units into the skin at bedtime. Reported on 02/02/2016    Historical Provider, MD    Allergies as of 01/18/2016  . (No Known Allergies)    History reviewed. No pertinent family history.  Social History   Social History  . Marital Status: Single    Spouse Name: N/A  . Number of Children: N/A  . Years of Education: N/A   Occupational History  . Not on file.   Social History Main Topics  . Smoking status: Never Smoker   . Smokeless tobacco: Never Used  . Alcohol Use: Not on file  . Drug Use: No  . Sexual Activity: Not on file   Other Topics Concern  . Not on file   Social History Narrative     Physical Exam: BP 134/76 mmHg  Pulse 81  Temp(Src) 96.8 F (36 C) (  Tympanic)  Resp 16  Ht 5\' 4"  (1.626 m)  Wt 65.772 kg (145 lb)  BMI 24.88 kg/m2  SpO2 98% General:   Alert,  pleasant and cooperative in NAD Head:  Normocephalic and atraumatic. Neck:  Supple; no masses or thyromegaly. Lungs:  Clear throughout to auscultation.    Heart:  Regular rate and rhythm. Abdomen:  Soft, nontender and nondistended. Normal bowel sounds, without guarding, and without rebound.   Neurologic:  Alert and  oriented x4;  grossly normal neurologically.  Impression/Plan: [name] is here for an endoscopy to be performed for early satiety, bloating  Risks, benefits, limitations, and alternatives regarding  endoscopy have been reviewed with the patient.  Questions have been answered.  All parties agreeable.   Josefine Class, MD  02/02/2016, 9:03 AM

## 2016-02-02 NOTE — Discharge Instructions (Signed)

## 2016-02-02 NOTE — Op Note (Signed)
[facility] Gastroenterology Patient Name: [name] Procedure Date: 02/02/2016 9:04 AM MRN: [id number] Account #: [id number] Date of Birth: Oct 07, 1951 Admit Type: Outpatient Age: 65 Room: [facility] ENDO ROOM 2 Gender: Female Note Status: Finalized Procedure:            Upper GI endoscopy Indications:          Abdominal bloating, Early satiety( normal gastric                        emptying study) Patient Profile:      This is a 65 year old female. Providers:            Gerrit Heck. Rayann Heman, MD Referring MD:         Franki Monte, MD (Referring MD) Medicines:            Propofol per Anesthesia Complications:        No immediate complications. Procedure:            Pre-Anesthesia Assessment:                       - Prior to the procedure, a History and Physical was                        performed, and patient medications, allergies and                        sensitivities were reviewed. The patient's tolerance of                        previous anesthesia was reviewed.                       After obtaining informed consent, the endoscope was                        passed under direct vision. Throughout the procedure,                        the patient's blood pressure, pulse, and oxygen                        saturations were monitored continuously. The Endoscope                        was introduced through the mouth, and advanced to the                        second part of duodenum. The upper GI endoscopy was                        accomplished without difficulty. The patient tolerated                        the procedure well. Findings:      A small hiatal hernia was present.      The stomach was normal.      The examined duodenum was normal.      Four biopsies were obtained with cold forceps for histology in the       duodenal bulb and in the second portion of the duodenum.  Four biopsies were obtained with cold forceps for histology randomly in       the  gastric body and in the gastric antrum. Impression:           - Small hiatal hernia.                       - Normal stomach.                       - Normal examined duodenum.                       - Four biopsies were obtained in the duodenal bulb and                        in the second portion of the duodenum.                       - Four biopsies were obtained in the gastric body and                        in the gastric antrum. Recommendation:       - Await pathology results.                       - Nutrition consult                       - Low particle and low fodmap diet                       - probiotic                       - The findings and recommendations were discussed with                        the patient.                       - The findings and recommendations were discussed with                        the patient's family.                       - Resume regular diet.                       - Continue present medications. Procedure Code(s):    --- Professional ---                       [phone], Esophagogastroduodenoscopy, flexible, transoral;                        with biopsy, single or multiple Diagnosis Code(s):    --- Professional ---                       K44.9, Diaphragmatic hernia without obstruction or                        gangrene  R14.0, Abdominal distension (gaseous)                       R68.81, Early satiety CPT copyright 2016 American Medical Association. All rights reserved. The codes documented in this report are preliminary and upon coder review may  be revised to meet current compliance requirements. Mellody Life, MD 02/02/2016 9:29:10 AM This report has been signed electronically. Number of Addenda: 0 Note Initiated On: 02/02/2016 9:04 AM      [facility]

## 2016-02-05 LAB — SURGICAL PATHOLOGY

## 2016-02-07 ENCOUNTER — Encounter: Payer: Self-pay | Admitting: Gastroenterology

## 2016-03-05 ENCOUNTER — Ambulatory Visit: Payer: BC Managed Care – PPO | Admitting: Dietician

## 2016-06-20 ENCOUNTER — Ambulatory Visit: Payer: BC Managed Care – PPO | Admitting: Neurology

## 2016-07-03 ENCOUNTER — Ambulatory Visit (INDEPENDENT_AMBULATORY_CARE_PROVIDER_SITE_OTHER): Payer: BC Managed Care – PPO | Admitting: Neurology

## 2016-07-03 ENCOUNTER — Encounter: Payer: Self-pay | Admitting: Neurology

## 2016-07-03 VITALS — BP 112/60 | HR 88 | Temp 98.4°F | Ht 64.0 in | Wt 155.6 lb

## 2016-07-03 DIAGNOSIS — R413 Other amnesia: Secondary | ICD-10-CM

## 2016-07-03 NOTE — Patient Instructions (Addendum)
You look great. Continue with control of diabetes, blood pressure. Physical exercise and brain stimulation exercises (crossword puzzles, word search, etc) are important for brain health. Follow-up in 1 year, call for any changes

## 2016-07-03 NOTE — Progress Notes (Signed)
NEUROLOGY CONSULTATION NOTE  [name] MRN: [id number] DOB: [birth year]  Referring provider: Dr. Rochel Brome Primary care provider: Dr. Rochel Brome  Reason for consult:  Memory loss  Dear Dr Tobie Poet:  Thank you for your kind referral of [name] for consultation of the above symptoms. Although her history is well known to you, please allow me to reiterate it for the purpose of our medical record. Records and images were personally reviewed where available.  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 She started noticing changes last school year, she was told by co-workers that she was asking the same questions. Her son has anxiety and has been concerned because she comes down to the kitchen and does not see him to her side. He thinks there is something wrong. She has occasional word-finding difficulties. She denies getting lost driving, no missed bills or medications. She denies any difficulties with ADLs. She drinks 1-2 glasses of wine every night for the past 8-10 years. She denies any head injuries. She reports they were told her father has dementia, but they are unsure if true. Her sister had strokes with some cognitive changes. She has a history of anxiety and depression, reporting rare anxiety. She denies any worsening stress, but that she had to learn a lot of new things for the new school year. She denies any headaches, dizziness, diplopia, dysarthria, dysphagia, neck/back pain, focal numbness/tingling/weakness, bowel/bladder dysfunction. No anosmia, tremors, no falls.   TSH and B12 done at PCP office were normal. She had an MRI brain with and without contrast done 04/05/16 which was reported as unremarkable, mild chronic small vessel disease and atrophy, no acute changes. Images unavailable for review.  PAST MEDICAL HISTORY: Past Medical History:    Diagnosis Date  . Diabetes mellitus without complication ([place])   . Hypertension   . Hypothyroidism     PAST SURGICAL HISTORY: Past Surgical History:  Procedure Laterality Date  . APPENDECTOMY    . CESAREAN SECTION    . ESOPHAGOGASTRODUODENOSCOPY (EGD) WITH PROPOFOL N/A 02/02/2016   Procedure: ESOPHAGOGASTRODUODENOSCOPY (EGD) WITH PROPOFOL;  Surgeon: Josefine Class, MD;  Location: [facility] ENDOSCOPY;  Service: Endoscopy;  Laterality: N/A;  . JOINT REPLACEMENT     Knee     MEDICATIONS: Current Outpatient Prescriptions on File Prior to Visit  Medication Sig Dispense Refill  . ALISKIREN-AMLODIPINE PO Take by mouth.    [name] aspirin EC 81 MG tablet Take 81 mg by mouth daily.    . insulin aspart (NOVOLOG) 100 UNIT/ML injection Inject subcutaneously. SLIDING SCALE    . insulin glargine (LANTUS) 100 UNIT/ML injection Inject 15 Units into the skin at bedtime. Reported on 02/02/2016    . mometasone (NASONEX) 50 MCG/ACT nasal spray Place 1 spray into the nose daily.    [name] omeprazole (PRILOSEC) 40 MG capsule Take 1 capsule (40 mg total) by mouth daily. 30 capsule 5  . Probiotic Product (PROBIOTIC PO) Take by mouth.    . ranitidine (ZANTAC) 300 MG tablet Take 1 tablet (300 mg total) by mouth at bedtime. 30 tablet 5  . SIMVASTATIN PO Take 5 mg by mouth.     . tolterodine (DETROL LA) 4 MG 24 hr capsule Take 4 mg by mouth daily.    . VENLAFAXINE HCL PO Take 25 mg by mouth.     . Vitamin D, Ergocalciferol, (DRISDOL) 50000 units CAPS capsule Take 50,000 Units by  mouth every 7 (seven) days.     No current facility-administered medications on file prior to visit.     ALLERGIES: No Known Allergies  FAMILY HISTORY: No family history on file.  SOCIAL HISTORY: Social History   Social History  . Marital status: Single    Spouse name: N/A  . Number of children: N/A  . Years of education: N/A   Occupational History  . Not on file.   Social History Main Topics  . Smoking status: Never Smoker   . Smokeless tobacco: Never Used  . Alcohol use Not on file  . Drug use: No  . Sexual activity: Not on file   Other Topics Concern  . Not on file   Social History Narrative  . No narrative on file    REVIEW OF SYSTEMS: Constitutional: No fevers, chills, or sweats, no generalized fatigue, change in appetite Eyes: No visual changes, double vision, eye pain Ear, nose and throat: No hearing loss, ear pain, nasal congestion, sore throat Cardiovascular: No chest pain, palpitations Respiratory:  No shortness of breath at rest or with exertion, wheezes GastrointestinaI: No nausea, vomiting, diarrhea, abdominal pain, fecal incontinence Genitourinary:  No dysuria, urinary retention or frequency Musculoskeletal:  No neck pain, back pain Integumentary: No rash, pruritus, skin lesions Neurological: as above Psychiatric: No depression, insomnia, anxiety Endocrine: No palpitations, fatigue, diaphoresis, mood swings, change in appetite, change in weight, increased thirst Hematologic/Lymphatic:  No anemia, purpura, petechiae. Allergic/Immunologic: no itchy/runny eyes, nasal congestion, recent allergic reactions, rashes  PHYSICAL EXAM: Vitals:   07/03/16 1432  BP: 112/60  Pulse: 88  Temp: 98.4 F (36.9 C)   General: No acute distress Head:  Normocephalic/atraumatic Eyes: Fundoscopic exam shows bilateral sharp discs, no vessel changes, exudates, or hemorrhages Neck: supple, no paraspinal tenderness, full range of motion Back: No paraspinal tenderness Heart: regular rate and rhythm Lungs: Clear to auscultation bilaterally. Vascular: No carotid bruits. Skin/Extremities: No rash, no edema Neurological Exam: Mental status: alert and oriented to person, place, and time, no dysarthria or aphasia, Fund of knowledge is appropriate.  Recent and remote memory are intact.  Attention and concentration are normal.    Able to name objects and repeat phrases.  Montreal Cognitive Assessment  07/04/2016   Visuospatial/ Executive (0/5) 5  Naming (0/3) 3  Attention: Read list of digits (0/2) 2  Attention: Read list of letters (0/1) 1  Attention: Serial 7 subtraction starting at 100 (0/3) 3  Language: Repeat phrase (0/2) 2  Language : Fluency (0/1) 1  Abstraction (0/2) 2  Delayed Recall (0/5) 5  Orientation (0/6) 6  Total 30   Cranial nerves: CN I: not tested CN II: pupils equal, round and reactive to light, visual fields intact, fundi unremarkable. CN III, IV, VI:  full range of motion, no nystagmus, no ptosis CN V: facial sensation intact CN VII: upper and lower face symmetric CN VIII: hearing intact to finger rub CN IX, X: gag intact, uvula midline CN XI: sternocleidomastoid and trapezius muscles intact CN XII: tongue midline Bulk & Tone: normal, no fasciculations. Motor: 5/5 throughout with no pronator drift. Sensation: intact to light touch, cold, pin, vibration and joint position sense.  No extinction to double simultaneous stimulation.  Romberg test negative Deep Tendon Reflexes: +2 throughout, no ankle clonus Plantar responses: downgoing bilaterally Cerebellar: no incoordination on finger to nose, heel to shin. No dysdiadochokinesia Gait: narrow-based and steady, able to tandem walk adequately. Tremor: none  IMPRESSION: This is a 65 year old right-handed woman  with a history of hypertension, hyperlipidemia, diabetes, presenting for evaluation of worsening memory. Her neurological exam is normal, MOCA score normal. MRI brain unremarkable, TSH and B12 normal. We discussed different causes of memory changes, symptoms may relate to age-related memory changes magnified by stress of learning new material for school year. She also reports her son is very anxious about her symptoms. No indication to start cholinesterase inhibitors at this time. We discussed the importance of control of vascular risk factors, physical exercise, and brain stimulation exercises for brain health. If memory  continues to worsen, neuropsychological evaluation may be considered. She will follow-up in 1 year and knows to call for any changes.  Thank you for allowing me to participate in the care of this patient. Please do not hesitate to call for any questions or concerns.   Ellouise Newer, M.D.  CC: Dr. Tobie Poet

## 2016-07-04 ENCOUNTER — Encounter: Payer: Self-pay | Admitting: Neurology

## 2016-07-04 DIAGNOSIS — R413 Other amnesia: Secondary | ICD-10-CM | POA: Insufficient documentation

## 2016-07-04 NOTE — Progress Notes (Signed)
OV note sent to PCP

## 2017-07-04 ENCOUNTER — Ambulatory Visit: Payer: BC Managed Care – PPO | Admitting: Neurology

## 2017-11-10 ENCOUNTER — Ambulatory Visit: Payer: BC Managed Care – PPO | Admitting: Neurology

## 2017-12-23 DIAGNOSIS — L821 Other seborrheic keratosis: Secondary | ICD-10-CM | POA: Diagnosis not present

## 2017-12-23 DIAGNOSIS — L578 Other skin changes due to chronic exposure to nonionizing radiation: Secondary | ICD-10-CM | POA: Diagnosis not present

## 2017-12-23 DIAGNOSIS — L700 Acne vulgaris: Secondary | ICD-10-CM | POA: Diagnosis not present

## 2017-12-30 DIAGNOSIS — R928 Other abnormal and inconclusive findings on diagnostic imaging of breast: Secondary | ICD-10-CM | POA: Diagnosis not present

## 2017-12-30 DIAGNOSIS — I10 Essential (primary) hypertension: Secondary | ICD-10-CM | POA: Diagnosis not present

## 2017-12-30 DIAGNOSIS — E109 Type 1 diabetes mellitus without complications: Secondary | ICD-10-CM | POA: Diagnosis not present

## 2017-12-30 DIAGNOSIS — E782 Mixed hyperlipidemia: Secondary | ICD-10-CM | POA: Diagnosis not present

## 2018-01-08 DIAGNOSIS — R509 Fever, unspecified: Secondary | ICD-10-CM | POA: Diagnosis not present

## 2018-01-08 DIAGNOSIS — J00 Acute nasopharyngitis [common cold]: Secondary | ICD-10-CM | POA: Diagnosis not present

## 2018-01-15 DIAGNOSIS — E039 Hypothyroidism, unspecified: Secondary | ICD-10-CM | POA: Diagnosis not present

## 2018-01-15 DIAGNOSIS — E1065 Type 1 diabetes mellitus with hyperglycemia: Secondary | ICD-10-CM | POA: Diagnosis not present

## 2018-01-20 DIAGNOSIS — Z794 Long term (current) use of insulin: Secondary | ICD-10-CM | POA: Diagnosis not present

## 2018-01-20 DIAGNOSIS — E10319 Type 1 diabetes mellitus with unspecified diabetic retinopathy without macular edema: Secondary | ICD-10-CM | POA: Diagnosis not present

## 2018-01-20 DIAGNOSIS — E039 Hypothyroidism, unspecified: Secondary | ICD-10-CM | POA: Diagnosis not present

## 2018-01-22 DIAGNOSIS — M7752 Other enthesopathy of left foot: Secondary | ICD-10-CM | POA: Insufficient documentation

## 2018-01-22 DIAGNOSIS — E119 Type 2 diabetes mellitus without complications: Secondary | ICD-10-CM | POA: Diagnosis not present

## 2018-01-22 HISTORY — DX: Other enthesopathy of left foot and ankle: M77.52

## 2018-01-24 DIAGNOSIS — R69 Illness, unspecified: Secondary | ICD-10-CM | POA: Diagnosis not present

## 2018-01-28 DIAGNOSIS — R928 Other abnormal and inconclusive findings on diagnostic imaging of breast: Secondary | ICD-10-CM | POA: Diagnosis not present

## 2018-01-28 DIAGNOSIS — R922 Inconclusive mammogram: Secondary | ICD-10-CM | POA: Diagnosis not present

## 2018-03-05 ENCOUNTER — Telehealth: Payer: Self-pay | Admitting: Gastroenterology

## 2018-03-05 NOTE — Telephone Encounter (Signed)
Patient states she needs medication reglan refilled at [facility] in [place]. Pt states she sees Dr.Gupta and takes medication once a day.

## 2018-03-05 NOTE — Telephone Encounter (Signed)
Would you like to give her refills, if so what mg and for how long?

## 2018-03-09 MED ORDER — METOCLOPRAMIDE HCL 10 MG PO TABS: 10.0000 mg | ORAL_TABLET | Freq: Every day | ORAL | 0 refills | Status: DC

## 2018-03-09 NOTE — Telephone Encounter (Signed)
Sent medication to patients pharmacy.  

## 2018-03-09 NOTE — Telephone Encounter (Signed)
reglan 10mg  po once a day (30), no refils

## 2018-04-22 DIAGNOSIS — I10 Essential (primary) hypertension: Secondary | ICD-10-CM | POA: Diagnosis not present

## 2018-04-22 DIAGNOSIS — E782 Mixed hyperlipidemia: Secondary | ICD-10-CM | POA: Diagnosis not present

## 2018-04-22 DIAGNOSIS — H6122 Impacted cerumen, left ear: Secondary | ICD-10-CM | POA: Diagnosis not present

## 2018-04-22 DIAGNOSIS — R928 Other abnormal and inconclusive findings on diagnostic imaging of breast: Secondary | ICD-10-CM | POA: Diagnosis not present

## 2018-04-22 DIAGNOSIS — E1043 Type 1 diabetes mellitus with diabetic autonomic (poly)neuropathy: Secondary | ICD-10-CM | POA: Diagnosis not present

## 2018-05-13 DIAGNOSIS — M76822 Posterior tibial tendinitis, left leg: Secondary | ICD-10-CM | POA: Diagnosis not present

## 2018-05-13 DIAGNOSIS — M7742 Metatarsalgia, left foot: Secondary | ICD-10-CM | POA: Diagnosis not present

## 2018-06-06 DIAGNOSIS — R69 Illness, unspecified: Secondary | ICD-10-CM | POA: Diagnosis not present

## 2018-07-13 ENCOUNTER — Ambulatory Visit: Payer: BC Managed Care – PPO | Admitting: Neurology

## 2018-07-15 ENCOUNTER — Ambulatory Visit: Payer: BC Managed Care – PPO | Admitting: Neurology

## 2018-07-23 DIAGNOSIS — G2581 Restless legs syndrome: Secondary | ICD-10-CM | POA: Diagnosis not present

## 2018-07-23 DIAGNOSIS — R69 Illness, unspecified: Secondary | ICD-10-CM | POA: Diagnosis not present

## 2018-07-23 DIAGNOSIS — Z23 Encounter for immunization: Secondary | ICD-10-CM | POA: Diagnosis not present

## 2018-07-23 DIAGNOSIS — E1043 Type 1 diabetes mellitus with diabetic autonomic (poly)neuropathy: Secondary | ICD-10-CM | POA: Diagnosis not present

## 2018-07-23 DIAGNOSIS — E782 Mixed hyperlipidemia: Secondary | ICD-10-CM | POA: Diagnosis not present

## 2018-07-23 DIAGNOSIS — R5383 Other fatigue: Secondary | ICD-10-CM | POA: Diagnosis not present

## 2018-07-23 DIAGNOSIS — I10 Essential (primary) hypertension: Secondary | ICD-10-CM | POA: Diagnosis not present

## 2018-07-23 DIAGNOSIS — E109 Type 1 diabetes mellitus without complications: Secondary | ICD-10-CM | POA: Diagnosis not present

## 2018-07-29 ENCOUNTER — Encounter

## 2018-07-29 ENCOUNTER — Ambulatory Visit: Payer: BC Managed Care – PPO | Admitting: Neurology

## 2018-07-30 DIAGNOSIS — R69 Illness, unspecified: Secondary | ICD-10-CM | POA: Diagnosis not present

## 2018-08-04 DIAGNOSIS — E039 Hypothyroidism, unspecified: Secondary | ICD-10-CM | POA: Diagnosis not present

## 2018-08-04 DIAGNOSIS — E10319 Type 1 diabetes mellitus with unspecified diabetic retinopathy without macular edema: Secondary | ICD-10-CM | POA: Diagnosis not present

## 2018-08-04 DIAGNOSIS — E782 Mixed hyperlipidemia: Secondary | ICD-10-CM | POA: Diagnosis not present

## 2018-08-07 DIAGNOSIS — R69 Illness, unspecified: Secondary | ICD-10-CM | POA: Diagnosis not present

## 2018-08-12 DIAGNOSIS — Z6827 Body mass index (BMI) 27.0-27.9, adult: Secondary | ICD-10-CM | POA: Diagnosis not present

## 2018-08-12 DIAGNOSIS — R0789 Other chest pain: Secondary | ICD-10-CM | POA: Diagnosis not present

## 2018-08-12 DIAGNOSIS — E663 Overweight: Secondary | ICD-10-CM | POA: Diagnosis not present

## 2018-08-12 DIAGNOSIS — Z0001 Encounter for general adult medical examination with abnormal findings: Secondary | ICD-10-CM | POA: Diagnosis not present

## 2018-08-12 DIAGNOSIS — Z201 Contact with and (suspected) exposure to tuberculosis: Secondary | ICD-10-CM | POA: Diagnosis not present

## 2018-08-14 DIAGNOSIS — Z111 Encounter for screening for respiratory tuberculosis: Secondary | ICD-10-CM | POA: Diagnosis not present

## 2018-08-25 DIAGNOSIS — R079 Chest pain, unspecified: Secondary | ICD-10-CM | POA: Diagnosis not present

## 2018-08-25 DIAGNOSIS — R0789 Other chest pain: Secondary | ICD-10-CM | POA: Diagnosis not present

## 2018-08-25 DIAGNOSIS — E108 Type 1 diabetes mellitus with unspecified complications: Secondary | ICD-10-CM | POA: Diagnosis not present

## 2018-08-25 DIAGNOSIS — R072 Precordial pain: Secondary | ICD-10-CM | POA: Diagnosis not present

## 2018-08-25 DIAGNOSIS — I10 Essential (primary) hypertension: Secondary | ICD-10-CM | POA: Diagnosis not present

## 2018-08-25 DIAGNOSIS — E785 Hyperlipidemia, unspecified: Secondary | ICD-10-CM | POA: Diagnosis not present

## 2018-08-26 ENCOUNTER — Ambulatory Visit: Payer: BC Managed Care – PPO | Admitting: Neurology

## 2018-09-02 DIAGNOSIS — R0789 Other chest pain: Secondary | ICD-10-CM | POA: Diagnosis not present

## 2018-09-09 DIAGNOSIS — R69 Illness, unspecified: Secondary | ICD-10-CM | POA: Diagnosis not present

## 2018-09-21 DIAGNOSIS — R69 Illness, unspecified: Secondary | ICD-10-CM | POA: Diagnosis not present

## 2018-09-28 DIAGNOSIS — E103553 Type 1 diabetes mellitus with stable proliferative diabetic retinopathy, bilateral: Secondary | ICD-10-CM | POA: Diagnosis not present

## 2018-09-28 DIAGNOSIS — R0789 Other chest pain: Secondary | ICD-10-CM

## 2018-10-20 DIAGNOSIS — H26491 Other secondary cataract, right eye: Secondary | ICD-10-CM | POA: Diagnosis not present

## 2018-10-24 DIAGNOSIS — H1032 Unspecified acute conjunctivitis, left eye: Secondary | ICD-10-CM | POA: Diagnosis not present

## 2018-11-09 DIAGNOSIS — J208 Acute bronchitis due to other specified organisms: Secondary | ICD-10-CM | POA: Diagnosis not present

## 2018-11-16 DIAGNOSIS — R69 Illness, unspecified: Secondary | ICD-10-CM | POA: Diagnosis not present

## 2018-12-25 DIAGNOSIS — K580 Irritable bowel syndrome with diarrhea: Secondary | ICD-10-CM | POA: Diagnosis not present

## 2018-12-25 DIAGNOSIS — Z6826 Body mass index (BMI) 26.0-26.9, adult: Secondary | ICD-10-CM | POA: Diagnosis not present

## 2018-12-25 DIAGNOSIS — Z23 Encounter for immunization: Secondary | ICD-10-CM | POA: Diagnosis not present

## 2018-12-25 DIAGNOSIS — I10 Essential (primary) hypertension: Secondary | ICD-10-CM | POA: Diagnosis not present

## 2018-12-25 DIAGNOSIS — E782 Mixed hyperlipidemia: Secondary | ICD-10-CM | POA: Diagnosis not present

## 2018-12-25 DIAGNOSIS — E1043 Type 1 diabetes mellitus with diabetic autonomic (poly)neuropathy: Secondary | ICD-10-CM | POA: Diagnosis not present

## 2018-12-25 DIAGNOSIS — E038 Other specified hypothyroidism: Secondary | ICD-10-CM | POA: Diagnosis not present

## 2019-01-05 DIAGNOSIS — H903 Sensorineural hearing loss, bilateral: Secondary | ICD-10-CM | POA: Diagnosis not present

## 2019-01-25 DIAGNOSIS — H903 Sensorineural hearing loss, bilateral: Secondary | ICD-10-CM | POA: Diagnosis not present

## 2019-02-02 DIAGNOSIS — E039 Hypothyroidism, unspecified: Secondary | ICD-10-CM | POA: Diagnosis not present

## 2019-02-02 DIAGNOSIS — E109 Type 1 diabetes mellitus without complications: Secondary | ICD-10-CM | POA: Diagnosis not present

## 2019-04-05 DIAGNOSIS — E10319 Type 1 diabetes mellitus with unspecified diabetic retinopathy without macular edema: Secondary | ICD-10-CM | POA: Diagnosis not present

## 2019-04-05 DIAGNOSIS — Z794 Long term (current) use of insulin: Secondary | ICD-10-CM | POA: Diagnosis not present

## 2019-04-05 DIAGNOSIS — E782 Mixed hyperlipidemia: Secondary | ICD-10-CM | POA: Diagnosis not present

## 2019-04-05 DIAGNOSIS — E039 Hypothyroidism, unspecified: Secondary | ICD-10-CM | POA: Diagnosis not present

## 2019-04-08 ENCOUNTER — Other Ambulatory Visit: Payer: Self-pay

## 2019-04-09 ENCOUNTER — Telehealth (INDEPENDENT_AMBULATORY_CARE_PROVIDER_SITE_OTHER): Payer: PPO | Admitting: Neurology

## 2019-04-09 VITALS — Ht 64.0 in | Wt 150.0 lb

## 2019-04-09 DIAGNOSIS — R413 Other amnesia: Secondary | ICD-10-CM | POA: Diagnosis not present

## 2019-04-09 NOTE — Progress Notes (Signed)
Virtual Visit via Video Note The purpose of this virtual visit is to provide medical care while limiting exposure to the novel coronavirus.    Consent was obtained for video visit:  Yes.   Answered questions that patient had about telehealth interaction:  Yes.   I discussed the limitations, risks, security and privacy concerns of performing an evaluation and management service by telemedicine. I also discussed with the patient that there may be a patient responsible charge related to this service. The patient expressed understanding and agreed to proceed.  Pt location: Home Physician Location: office Name of referring provider:  Rochel Brome, MD I connected with [name] at patients initiation/request on 04/09/2019 at 10:00 AM EDT by video enabled telemedicine application and verified that I am speaking with the correct person using two identifiers. Pt MRN:  [id number] Pt DOB:  1951/07/25 Video Participants:  [name]   History of Present Illness:  The patient was last seen in August 2017 for memory changes. MOCA score in August 2017 was 30/30. MRI brain showed mild chronic microvascular disease, no acute changes. At that time, TSH and B12 were normal. Since her last visit, she feels her memory is similar, short term memory can be an issue where sometimes she has lapses and her mind has to go through a Rolodex, taking longer to pull information up. She eventually comes up with it. Long-term memory is pretty good. She lives alone and denies getting lost driving, denies missing bills or medications. She denies leaving the stove or faucet on or misplacing things. She is independent with dressing and bathing. 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 She exercises regularly. Her last HbA1c in March 2020 was 8.2, medications have been adjusted. BP and cholesterol okay per patient. She denies any headaches, dizziness, vision  changes, no falls. Her balance is not good. She had some tingling in her left hand for a time, this has not bothered her in a while.   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 She started noticing changes last school year, she was told by co-workers that she was asking the same questions. Her son has anxiety and has been concerned because she comes down to the kitchen and does not see him to her side. He thinks there is something wrong. She has occasional word-finding difficulties. She denies getting lost driving, no missed bills or medications. She denies any difficulties with ADLs. She drinks 1-2 glasses of wine every night for the past 8-10 years. She denies any head injuries. She reports they were told her father has dementia, but they are unsure if true. Her sister had strokes with some cognitive changes. She has a history of anxiety and depression, reporting rare anxiety. She denies any worsening stress, but that she had to learn a lot of new things for the new school year. She denies any headaches, dizziness, diplopia, dysarthria, dysphagia, neck/back pain, focal numbness/tingling/weakness, bowel/bladder dysfunction. No anosmia, tremors, no falls.   TSH and B12 done at PCP office were normal. She had an MRI brain with and without contrast done 04/05/16 which was reported as unremarkable, mild chronic small vessel disease and atrophy, no acute changes. Images unavailable for review.    Current Outpatient Medications on File Prior to Visit  Medication Sig Dispense Refill  . ALISKIREN-AMLODIPINE PO Take by  mouth.    . aspirin EC 81 MG tablet Take 81 mg by mouth 2 (two) times a week.     . dicyclomine (BENTYL) 20 MG tablet Take by mouth.    . insulin aspart (NOVOLOG) 100 UNIT/ML injection Inject subcutaneously. SLIDING SCALE    . insulin glargine (LANTUS) 100  UNIT/ML injection Inject 15 Units into the skin at bedtime. Reported on 02/02/2016    . levothyroxine (SYNTHROID, LEVOTHROID) 125 MCG tablet Take 125 mcg by mouth daily before breakfast.    . lisinopril-hydrochlorothiazide (PRINZIDE,ZESTORETIC) 10-12.5 MG tablet Take 1 tablet by mouth daily.    . metoCLOPramide (REGLAN) 10 MG tablet Take 1 tablet (10 mg total) by mouth daily. 30 tablet 0  . omeprazole (PRILOSEC) 40 MG capsule Take 1 capsule (40 mg total) by mouth daily. 30 capsule 5  . Probiotic Product (PROBIOTIC PO) Take by mouth.    [name] SIMVASTATIN PO Take 5 mg by mouth.     . VENLAFAXINE HCL PO Take 150 mg by mouth.     . Vitamin D, Ergocalciferol, (DRISDOL) 50000 units CAPS capsule Take 50,000 Units by mouth every 7 (seven) days.    . Ibandronate Sodium (BONIVA PO) Take by mouth.     No current facility-administered medications on file prior to visit.      Observations/Objective:   Vitals:   04/09/19 0903  Weight: 150 lb (68 kg)  Height: 5\' 4"  (1.626 m)   GEN:  The patient appears stated age and is in NAD.  Neurological examination: Patient is awake, alert, oriented x 3. No aphasia or dysarthria. Intact fluency and comprehension. Remote and recent memory intact. Able to name and repeat. Cranial nerves: Extraocular movements intact with no nystagmus. No facial asymmetry. Motor: moves all extremities symmetrically, at least anti-gravity x 4. No incoordination on finger to nose testing. Gait: narrow-based and steady, mild difficulty with tandem walk.  Montreal Cognitive Assessment  04/09/2019 (MOCA-Blind done over phone) 07/04/2016  Visuospatial/ Executive (0/5) - 5  Naming (0/3) - 3  Attention: Read list of digits (0/2) 2 2  Attention: Read list of letters (0/1) 1 1  Attention: Serial 7 subtraction starting at 100 (0/3) 3 3  Language: Repeat phrase (0/2) 2 2  Language : Fluency (0/1) 1 1  Abstraction (0/2) 2 2  Delayed Recall (0/5) 3 5  Orientation (0/6) 6 6  Total 20/22 (nl >18) 30     Assessment and Plan:   This is a 68 yo RH woman with a history of hypertension, hyperlipidemia, diabetes, who presented in 2017 for worsening memory. She returns almost 3 years later for follow-up, no significant changes per patient, no difficulties with complex tasks, although there is no family to corroborate history. Her MOCA-Blind score (done over phone) today is 20/22 (nl >18/22).  MRI brain unremarkable. We again discussed age-related memory changes, as well as memory changes that can occur with hyperglycemia, vascular risk factors. We discussed the importance of control of vascular risk factors, physical exercise, and brain stimulation exercises for brain health. If memory continues to worsen, neuropsychological evaluation may be considered. She was advised to have family present on next visit and will follow-up in 6-8 months. She knows to call for any changes.   Follow Up Instructions:   -I discussed the assessment and treatment plan with the patient. The patient was provided an opportunity to ask questions and all were answered. The patient agreed with the plan and demonstrated an understanding of the instructions.   The patient  was advised to call back or seek an in-person evaluation if the symptoms worsen or if the condition fails to improve as anticipated.     Cameron Sprang, MD

## 2019-04-15 ENCOUNTER — Ambulatory Visit: Payer: BC Managed Care – PPO | Admitting: Neurology

## 2019-07-06 DIAGNOSIS — S82232A Displaced oblique fracture of shaft of left tibia, initial encounter for closed fracture: Secondary | ICD-10-CM | POA: Diagnosis not present

## 2019-07-06 DIAGNOSIS — S82202A Unspecified fracture of shaft of left tibia, initial encounter for closed fracture: Secondary | ICD-10-CM | POA: Diagnosis not present

## 2019-07-06 DIAGNOSIS — S82302A Unspecified fracture of lower end of left tibia, initial encounter for closed fracture: Secondary | ICD-10-CM | POA: Diagnosis not present

## 2019-07-06 DIAGNOSIS — W19XXXA Unspecified fall, initial encounter: Secondary | ICD-10-CM | POA: Diagnosis not present

## 2019-07-06 DIAGNOSIS — S89192A Other physeal fracture of lower end of left tibia, initial encounter for closed fracture: Secondary | ICD-10-CM | POA: Diagnosis not present

## 2019-07-06 DIAGNOSIS — R609 Edema, unspecified: Secondary | ICD-10-CM | POA: Diagnosis not present

## 2019-07-06 DIAGNOSIS — R61 Generalized hyperhidrosis: Secondary | ICD-10-CM | POA: Diagnosis not present

## 2019-07-08 DIAGNOSIS — S82302A Unspecified fracture of lower end of left tibia, initial encounter for closed fracture: Secondary | ICD-10-CM | POA: Diagnosis not present

## 2019-07-15 DIAGNOSIS — S82302A Unspecified fracture of lower end of left tibia, initial encounter for closed fracture: Secondary | ICD-10-CM | POA: Diagnosis not present

## 2019-07-16 ENCOUNTER — Other Ambulatory Visit: Payer: Self-pay

## 2019-07-16 NOTE — Patient Outreach (Signed)
[facility]) Care Management  07/16/2019  [name] 1951-09-19 [id number]   Social work referral received from HTA for transportation assistance.  Successful outreach to patient today.  Patient reports that she recently broke her leg and is unable to drive and has difficulty getting down steps in front of her home.  She contacted [facility] about transportation but was informed that, for liability reasons, they are not able to provide hands on assistance. Patient was informed that this will be the case for any transportation service.  Discussed programs that assist with ramp construction, [facility] and [facility], however this process typically takes at least six months.  Patient was appreciative of call but stated that she has friends that she feels will be able to assist. Closing case.  Ronn Melena, BSW Social Worker [phone]

## 2019-07-21 ENCOUNTER — Other Ambulatory Visit: Payer: Self-pay | Admitting: Pharmacist

## 2019-07-21 DIAGNOSIS — S82302A Unspecified fracture of lower end of left tibia, initial encounter for closed fracture: Secondary | ICD-10-CM | POA: Diagnosis not present

## 2019-07-21 NOTE — Patient Outreach (Signed)
[facility]) Care Management  07/21/2019  [name] [birth year] [id number]  Patient's chart was opened because I received a phone call from [place], [place]. Patient appeared on the HTA adherence list for statins. After review of the patient's pharmacy benefits, it became clear the patient has two drug plans:  [employer] and [employer].  Apparently, the last couple of fills at the patient's local pharmacy have been through [employer].  The confirmation of dual insurance was shared with Tiffany.  Elayne Guerin, PharmD, [place] Clinical Pharmacist [phone]

## 2019-07-28 DIAGNOSIS — S82302D Unspecified fracture of lower end of left tibia, subsequent encounter for closed fracture with routine healing: Secondary | ICD-10-CM | POA: Diagnosis not present

## 2019-08-02 ENCOUNTER — Encounter (HOSPITAL_COMMUNITY): Payer: Self-pay | Admitting: *Deleted

## 2019-08-02 ENCOUNTER — Other Ambulatory Visit (HOSPITAL_COMMUNITY)
Admission: RE | Admit: 2019-08-02 | Discharge: 2019-08-02 | Disposition: A | Discharge status: Home / Self Care | Payer: PPO | Source: Ambulatory Visit | Attending: Orthopedic Surgery | Admitting: Orthopedic Surgery

## 2019-08-02 DIAGNOSIS — Z79890 Hormone replacement therapy (postmenopausal): Secondary | ICD-10-CM | POA: Diagnosis not present

## 2019-08-02 DIAGNOSIS — G8918 Other acute postprocedural pain: Secondary | ICD-10-CM | POA: Diagnosis not present

## 2019-08-02 DIAGNOSIS — M80062A Age-related osteoporosis with current pathological fracture, left lower leg, initial encounter for fracture: Secondary | ICD-10-CM | POA: Diagnosis present

## 2019-08-02 DIAGNOSIS — Z800 Family history of malignant neoplasm of digestive organs: Secondary | ICD-10-CM | POA: Diagnosis not present

## 2019-08-02 DIAGNOSIS — Z794 Long term (current) use of insulin: Secondary | ICD-10-CM | POA: Diagnosis not present

## 2019-08-02 DIAGNOSIS — I10 Essential (primary) hypertension: Secondary | ICD-10-CM | POA: Diagnosis present

## 2019-08-02 DIAGNOSIS — S82872A Displaced pilon fracture of left tibia, initial encounter for closed fracture: Secondary | ICD-10-CM | POA: Diagnosis present

## 2019-08-02 DIAGNOSIS — E039 Hypothyroidism, unspecified: Secondary | ICD-10-CM | POA: Diagnosis present

## 2019-08-02 DIAGNOSIS — S82892A Other fracture of left lower leg, initial encounter for closed fracture: Secondary | ICD-10-CM | POA: Diagnosis not present

## 2019-08-02 DIAGNOSIS — Z20828 Contact with and (suspected) exposure to other viral communicable diseases: Secondary | ICD-10-CM | POA: Diagnosis present

## 2019-08-02 DIAGNOSIS — K219 Gastro-esophageal reflux disease without esophagitis: Secondary | ICD-10-CM | POA: Diagnosis present

## 2019-08-02 DIAGNOSIS — D62 Acute posthemorrhagic anemia: Secondary | ICD-10-CM | POA: Diagnosis not present

## 2019-08-02 DIAGNOSIS — Z96653 Presence of artificial knee joint, bilateral: Secondary | ICD-10-CM | POA: Diagnosis present

## 2019-08-02 DIAGNOSIS — E109 Type 1 diabetes mellitus without complications: Secondary | ICD-10-CM | POA: Diagnosis present

## 2019-08-02 DIAGNOSIS — S82302D Unspecified fracture of lower end of left tibia, subsequent encounter for closed fracture with routine healing: Secondary | ICD-10-CM | POA: Diagnosis not present

## 2019-08-02 DIAGNOSIS — Z7982 Long term (current) use of aspirin: Secondary | ICD-10-CM | POA: Diagnosis not present

## 2019-08-02 DIAGNOSIS — E119 Type 2 diabetes mellitus without complications: Secondary | ICD-10-CM | POA: Diagnosis not present

## 2019-08-02 DIAGNOSIS — M898X9 Other specified disorders of bone, unspecified site: Secondary | ICD-10-CM | POA: Diagnosis present

## 2019-08-02 DIAGNOSIS — J45909 Unspecified asthma, uncomplicated: Secondary | ICD-10-CM | POA: Diagnosis not present

## 2019-08-02 LAB — SARS CORONAVIRUS 2 (TAT 6-24 HRS): SARS Coronavirus 2: NEGATIVE

## 2019-08-02 NOTE — Progress Notes (Signed)
Anesthesia Chart Review:   Case: [id number] Date/Time: 08/03/19 0745   Procedure: OPEN REDUCTION INTERNAL FIXATION (ORIF) ANKLE FRACTURE (Left Ankle)   Anesthesia type: General   Pre-op diagnosis: LEFT ANKLE FRACTURE   Location: [facility] OR ROOM 03 / [place] OR   Surgeon: Altamese Collins, MD      DISCUSSION:  - Pt is a 68 year old female with hx HTN, type 1 DM  - Pt experienced chest pain last fall, went to ED 08/25/18- was sent home after evaluation. Subsequently had negative stress echo 09/28/18.    PROVIDERS: - PCP is Cox, Kirsten, MD   LABS: Will be obtained day of surgery    IMAGES: n/a   EKG 08/25/18 [facility]):  NSR. Possible LA enlargement. Cannot rule out anterior infarct, age undetermined.    CV:  Stress echo 09/28/18 [facility]):  - Echo images negative for ischemia - Negative stress echo by EKG, pain, and imaging criteria.     Past Medical History:  Diagnosis Date  . Depression   . Diabetes mellitus without complication ([place])   . GERD (gastroesophageal reflux disease)   . Hypertension   . Hypothyroidism   . Type 1 diabetes [facility])     Past Surgical History:  Procedure Laterality Date  . APPENDECTOMY    . CESAREAN SECTION    . ESOPHAGOGASTRODUODENOSCOPY (EGD) WITH PROPOFOL N/A 02/02/2016   Procedure: ESOPHAGOGASTRODUODENOSCOPY (EGD) WITH PROPOFOL;  Surgeon: Josefine Class, MD;  Location: [facility] ENDOSCOPY;  Service: Endoscopy;  Laterality: N/A;  . HAND SURGERY    . JOINT REPLACEMENT Bilateral    Knee   . TIBIALIS TENDON TRANSFER / REPAIR      MEDICATIONS: No current facility-administered medications for this encounter.    [name] amLODipine (NORVASC) 10 MG tablet  . ibuprofen (ADVIL) 200 MG tablet  . insulin aspart (NOVOLOG) 100 UNIT/ML injection  . insulin glargine (LANTUS) 100 UNIT/ML injection  . levothyroxine (SYNTHROID, LEVOTHROID) 125 MCG tablet  . lisinopril-hydrochlorothiazide (PRINZIDE,ZESTORETIC) 10-12.5 MG tablet  . LORazepam (ATIVAN)  0.5 MG tablet  . metoCLOPramide (REGLAN) 10 MG tablet  . Omega-3 Fatty Acids (FISH OIL PO)  . omeprazole (PRILOSEC) 40 MG capsule  . oxyCODONE-acetaminophen (PERCOCET/ROXICET) 5-325 MG tablet  . Probiotic Product (PROBIOTIC PO)  . rosuvastatin (CRESTOR) 10 MG tablet  . venlafaxine XR (EFFEXOR-XR) 150 MG 24 hr capsule  . Vitamin D, Ergocalciferol, (DRISDOL) 50000 units CAPS capsule  . aspirin EC 81 MG tablet  . dicyclomine (BENTYL) 20 MG tablet    If labs acceptable day of surgery, I anticipate pt can proceed with surgery as scheduled.  Willeen Cass, FNP-BC [facility] Short Stay Surgical Center/Anesthesiology Phone: [phone] 08/02/2019 3:46 PM

## 2019-08-02 NOTE — Anesthesia Preprocedure Evaluation (Addendum)
Anesthesia Evaluation  Patient identified by MRN, date of birth, ID band Patient awake    Reviewed: Allergy & Precautions, NPO status , Patient's Chart, lab work & pertinent test results  Airway Mallampati: II  TM Distance: >3 FB Neck ROM: Full    Dental no notable dental hx.    Pulmonary asthma ,    Pulmonary exam normal breath sounds clear to auscultation       Cardiovascular hypertension, Pt. on medications Normal cardiovascular exam Rhythm:Regular Rate:Normal     Neuro/Psych PSYCHIATRIC DISORDERS Depression Memory changesnegative neurological ROS     GI/Hepatic Neg liver ROS, GERD  Medicated,  Endo/Other  diabetes, Type 2, Insulin DependentHypothyroidism   Renal/GU negative Renal ROS     Musculoskeletal  (+) Arthritis , Osteoarthritis,    Abdominal   Peds  Hematology negative hematology ROS (+)   Anesthesia Other Findings   Reproductive/Obstetrics negative OB ROS                            Anesthesia Physical Anesthesia Plan  ASA: II  Anesthesia Plan: Regional and General   Post-op Pain Management: GA combined w/ Regional for post-op pain   Induction: Intravenous  PONV Risk Score and Plan: 2 and Ondansetron, Dexamethasone and Treatment may vary due to age or medical condition  Airway Management Planned: Oral ETT  Additional Equipment: None  Intra-op Plan:   Post-operative Plan: Extubation in OR  Informed Consent: I have reviewed the patients History and Physical, chart, labs and discussed the procedure including the risks, benefits and alternatives for the proposed anesthesia with the patient or authorized representative who has indicated his/her understanding and acceptance.     Dental advisory given  Plan Discussed with: CRNA  Anesthesia Plan Comments: (GA with adductor/popliteal block for postoperative pain. Surgeon requesting GA with muscle relaxation.)       Anesthesia Quick Evaluation

## 2019-08-02 NOTE — Progress Notes (Signed)
Patient denies chest pain or shob. Reports that in October of 2019 she had CP and was evaluated at [facility] ER. Cause was d/t bronchitis. Reports having several cardiac test. Records requested. Patient notified of visitation policy. Below instruction given regarding DM management.   How do I manage my blood sugar before surgery? . If your blood sugar is less than 70 mg/dL, you will need to treat for low blood sugar: o Do not take insulin. o Treat a low blood sugar (less than 70 mg/dL) with  cup of clear juice (cranberry or apple), 4 glucose tablets, OR glucose gel. Recheck blood sugar in 15 minutes after treatment (to make sure it is greater than 70 mg/dL). If your blood sugar is not greater than 70 mg/dL on recheck, call [phone] o  for further instructions. . Report your blood sugar to the short stay nurse when you get to Short Stay.  . If you are admitted to the hospital after surgery: o Your blood sugar will be checked by the staff and you will probably be given insulin after surgery (instead of oral diabetes medicines) to make sure you have good blood sugar levels. o The goal for blood sugar control after surgery is 80-180 mg/dL.    WHAT DO I DO ABOUT MY DIABETES MEDICATION?    . THE NIGHT BEFORE SURGERY, take __11_________ units of _Lantus__________insulin.       . If your CBG is greater than 220 mg/dL, you may take  of your sliding scale (correction) dose of insulin.

## 2019-08-02 NOTE — Progress Notes (Signed)
Spoke to [place] at Dr. Carlean Jews office and requested orders be placed for surgery.

## 2019-08-02 NOTE — H&P (Signed)
Orthopaedic Trauma Service (OTS) Consult   Patient ID: [name] MRN: [id number] DOB/AGE: June 26, 1951 68 y.o.   HPI: [name] is an 68 y.o. female who sustained a left distal tibia fracture while hiking on 07/03/2019.  She was initially seen and evaluated in [place] but referred to the orthopedic trauma specialist for definitive management.  Patient soft tissue swelling was too severe to intervene in early window.  She was followed serially to monitor resolution of her soft tissue swelling.  Patient presents today for repair of her left distal tibia fracture.  She does have history of previous ORIF of left ankle.  She has retained hardware in place.  Fracture is proximal to her tibial hardware.  Patient does have a history of diabetes which is fairly poorly controlled.  States that her last A1c was about 4 months ago and was 7.9%. History of hypothyroidism, no changes in dosage recently History of Boniva use x7 years and regularly obtains a DEXA  Patient has history of bilateral total knee arthroplasties  Past Medical History:  Diagnosis Date   Depression    Diabetes mellitus without complication ([place])    GERD (gastroesophageal reflux disease)    Hypertension    Hypothyroidism    Type 1 diabetes ([place])     Past Surgical History:  Procedure Laterality Date   APPENDECTOMY     CESAREAN SECTION     ESOPHAGOGASTRODUODENOSCOPY (EGD) WITH PROPOFOL N/A 02/02/2016   Procedure: ESOPHAGOGASTRODUODENOSCOPY (EGD) WITH PROPOFOL;  Surgeon: Josefine Class, MD;  Location: [facility] ENDOSCOPY;  Service: Endoscopy;  Laterality: N/A;   HAND SURGERY     JOINT REPLACEMENT Bilateral    Knee    TIBIALIS TENDON TRANSFER / REPAIR      Family History  Problem Relation Age of Onset   Cancer Mother        Colon cancer    Dementia Father    Neuropathy Father     Social History:  reports that she has never smoked. She has never used smokeless tobacco. She reports  current alcohol use of about 7.0 - 14.0 standard drinks of alcohol per week. She reports that she does not use drugs.  Allergies: No Known Allergies  Medications: I have reviewed the patient's current medications. Current Meds  Medication Sig   amLODipine (NORVASC) 10 MG tablet Take 10 mg by mouth daily.   ibuprofen (ADVIL) 200 MG tablet Take 600 mg by mouth 2 (two) times daily as needed (pain.).   insulin aspart (NOVOLOG) 100 UNIT/ML injection Inject 2-3 Units into the skin 3 (three) times daily before meals. Sliding scale   insulin glargine (LANTUS) 100 UNIT/ML injection Inject 14 Units into the skin every evening. Reported on 02/02/2016   levothyroxine (SYNTHROID, LEVOTHROID) 125 MCG tablet Take 125 mcg by mouth daily before breakfast.   lisinopril-hydrochlorothiazide (PRINZIDE,ZESTORETIC) 10-12.5 MG tablet Take 1 tablet by mouth daily.   LORazepam (ATIVAN) 0.5 MG tablet Take 0.5 mg by mouth 2 (two) times daily as needed for anxiety.   metoCLOPramide (REGLAN) 10 MG tablet Take 1 tablet (10 mg total) by mouth daily.   Omega-3 Fatty Acids (FISH OIL PO) Take 1 capsule by mouth daily.   omeprazole (PRILOSEC) 40 MG capsule Take 1 capsule (40 mg total) by mouth daily.   oxyCODONE-acetaminophen (PERCOCET/ROXICET) 5-325 MG tablet Take 1 tablet by mouth at bedtime as needed for severe pain (pain.).   Probiotic Product (PROBIOTIC PO) Take 1 capsule by mouth daily.  rosuvastatin (CRESTOR) 10 MG tablet Take 10 mg by mouth daily.   venlafaxine XR (EFFEXOR-XR) 150 MG 24 hr capsule Take 150 mg by mouth daily with breakfast.   Vitamin D, Ergocalciferol, (DRISDOL) 50000 units CAPS capsule Take 50,000 Units by mouth every Wednesday.     Results for orders placed or performed during the hospital encounter of 08/02/19 (from the past 48 hour(s))  SARS CORONAVIRUS 2 (TAT 6-24 HRS) Nasopharyngeal Nasopharyngeal Swab     Status: None   Collection Time: 08/02/19 10:47 AM   Specimen:  Nasopharyngeal Swab  Result Value Ref Range   SARS Coronavirus 2 NEGATIVE NEGATIVE    Comment: (NOTE) SARS-CoV-2 target nucleic acids are NOT DETECTED. The SARS-CoV-2 RNA is generally detectable in upper and lower respiratory specimens during the acute phase of infection. Negative results do not preclude SARS-CoV-2 infection, do not rule out co-infections with other pathogens, and should not be used as the sole basis for treatment or other patient management decisions. Negative results must be combined with clinical observations, patient history, and epidemiological information. The expected result is Negative. Fact Sheet for Patients: SugarRoll.be Fact Sheet for Healthcare Providers: https://www.woods-mathews.com/ This test is not yet approved or cleared by the [place] FDA and  has been authorized for detection and/or diagnosis of SARS-CoV-2 by FDA under an Emergency Use Authorization (EUA). This EUA will remain  in effect (meaning this test can be used) for the duration of the COVID-19 declaration under Section 56 4(b)(1) of the Act, 21 U.S.C. section 360bbb-3(b)(1), unless the authorization is terminated or revoked sooner. Performed at [facility], [place] [address], [place], [place] [address]     No results found.  Review of Systems  Constitutional: Negative for chills and fever.  Respiratory: Negative for shortness of breath and wheezing.   Cardiovascular: Negative for chest pain, palpitations and orthopnea.  Gastrointestinal: Negative for abdominal pain, nausea and vomiting.  Neurological: Negative for tingling and sensory change.   Height 5\' 4"  (1.626 m), weight 65.8 kg. Physical Exam Constitutional:      Appearance: Normal appearance. She is well-developed and well-groomed.  Cardiovascular:     Rate and Rhythm: Normal rate and regular rhythm.     Heart sounds: S1 normal and S2 normal.  Pulmonary:     Effort:  Pulmonary effort is normal. No respiratory distress.     Comments: CTA B  Abdominal:     Comments: NTND, +BS   Musculoskeletal:     Comments: Left lower extremity Short leg splint is in place Improved swelling Skin wrinkles with gentle compression over her ankle Distal motor and sensory functions are grossly intact Extremity is warm + DP pulse Compartments are soft No pain with passive stretching No knee pain Well-healed total knee incision  Skin:    General: Skin is warm.     Capillary Refill: Capillary refill takes less than 2 seconds.  Neurological:     Mental Status: She is alert and oriented to person, place, and time.     Comments: Unable to assess coordination and gait  Psychiatric:        Attention and Perception: Attention and perception normal.        Mood and Affect: Mood and affect normal.        Behavior: Behavior is cooperative.      Assessment/Plan:  68 y/o female s/p fall with L distal tibia fracture with retained hardware   -fall  - L distal tibia fracture with retained hardware  OR  for ORIF L distal tibia   NWB x 8 weeks  Admit for pain control, observation and therapy   Risks and benefits reviewed pt wishes to proceed  - Pain management:  Titrate post op  - ABL anemia/Hemodynamics  Monitor  - Medical issues   Tight cbg control to decrease chances of infection and/or nonunion   - DVT/PE prophylaxis:  lovenox post op   - ID:   periop abx  - Metabolic Bone Disease:  Check vitamin d levels  - Activity:  NWB L leg   - FEN/GI prophylaxis/Foley/Lines:  NPO  Advance diet post op    - Impediments to fracture healing:  Osteoporosis  Diabetes  Low energy fracture   - Dispo:  OR for ORIF L distal tibia   Admit post op for pain control, therapies, observation   [name], PA-C [phone] (C) 08/02/2019, 8:05 PM  [facility] [place] [id number] [phone] Domingo Sep (F)

## 2019-08-03 ENCOUNTER — Inpatient Hospital Stay (HOSPITAL_COMMUNITY)
Admission: RE | Disposition: A | Discharge status: Home / Self Care | Payer: Self-pay | Source: Ambulatory Visit | Attending: Orthopedic Surgery

## 2019-08-03 ENCOUNTER — Inpatient Hospital Stay (HOSPITAL_COMMUNITY): Payer: PPO | Admitting: Emergency Medicine

## 2019-08-03 ENCOUNTER — Inpatient Hospital Stay (HOSPITAL_COMMUNITY)
Admission: RE | Admit: 2019-08-03 | Discharge: 2019-08-05 | DRG: 493 | Disposition: A | Discharge status: Home Health | Payer: PPO | Source: Ambulatory Visit | Attending: Orthopedic Surgery | Admitting: Orthopedic Surgery

## 2019-08-03 ENCOUNTER — Encounter (HOSPITAL_COMMUNITY): Payer: Self-pay

## 2019-08-03 ENCOUNTER — Inpatient Hospital Stay (HOSPITAL_COMMUNITY): Payer: PPO

## 2019-08-03 ENCOUNTER — Other Ambulatory Visit: Payer: Self-pay

## 2019-08-03 DIAGNOSIS — J45909 Unspecified asthma, uncomplicated: Secondary | ICD-10-CM | POA: Diagnosis present

## 2019-08-03 DIAGNOSIS — I10 Essential (primary) hypertension: Secondary | ICD-10-CM | POA: Diagnosis present

## 2019-08-03 DIAGNOSIS — Z96653 Presence of artificial knee joint, bilateral: Secondary | ICD-10-CM | POA: Diagnosis present

## 2019-08-03 DIAGNOSIS — Z20828 Contact with and (suspected) exposure to other viral communicable diseases: Secondary | ICD-10-CM | POA: Diagnosis present

## 2019-08-03 DIAGNOSIS — M898X9 Other specified disorders of bone, unspecified site: Secondary | ICD-10-CM | POA: Diagnosis present

## 2019-08-03 DIAGNOSIS — Z79890 Hormone replacement therapy (postmenopausal): Secondary | ICD-10-CM | POA: Diagnosis not present

## 2019-08-03 DIAGNOSIS — D62 Acute posthemorrhagic anemia: Secondary | ICD-10-CM | POA: Diagnosis not present

## 2019-08-03 DIAGNOSIS — S82202A Unspecified fracture of shaft of left tibia, initial encounter for closed fracture: Secondary | ICD-10-CM

## 2019-08-03 DIAGNOSIS — Z7982 Long term (current) use of aspirin: Secondary | ICD-10-CM | POA: Diagnosis not present

## 2019-08-03 DIAGNOSIS — K219 Gastro-esophageal reflux disease without esophagitis: Secondary | ICD-10-CM | POA: Diagnosis present

## 2019-08-03 DIAGNOSIS — Z794 Long term (current) use of insulin: Secondary | ICD-10-CM | POA: Diagnosis not present

## 2019-08-03 DIAGNOSIS — E109 Type 1 diabetes mellitus without complications: Secondary | ICD-10-CM | POA: Diagnosis present

## 2019-08-03 DIAGNOSIS — E039 Hypothyroidism, unspecified: Secondary | ICD-10-CM | POA: Diagnosis present

## 2019-08-03 DIAGNOSIS — E119 Type 2 diabetes mellitus without complications: Secondary | ICD-10-CM

## 2019-08-03 DIAGNOSIS — S82302D Unspecified fracture of lower end of left tibia, subsequent encounter for closed fracture with routine healing: Secondary | ICD-10-CM | POA: Diagnosis not present

## 2019-08-03 DIAGNOSIS — M80062A Age-related osteoporosis with current pathological fracture, left lower leg, initial encounter for fracture: Principal | ICD-10-CM | POA: Diagnosis present

## 2019-08-03 DIAGNOSIS — Z800 Family history of malignant neoplasm of digestive organs: Secondary | ICD-10-CM

## 2019-08-03 DIAGNOSIS — T148XXA Other injury of unspecified body region, initial encounter: Secondary | ICD-10-CM

## 2019-08-03 DIAGNOSIS — Z419 Encounter for procedure for purposes other than remedying health state, unspecified: Secondary | ICD-10-CM

## 2019-08-03 DIAGNOSIS — S82872A Displaced pilon fracture of left tibia, initial encounter for closed fracture: Secondary | ICD-10-CM | POA: Diagnosis present

## 2019-08-03 DIAGNOSIS — I152 Hypertension secondary to endocrine disorders: Secondary | ICD-10-CM | POA: Diagnosis present

## 2019-08-03 DIAGNOSIS — E1159 Type 2 diabetes mellitus with other circulatory complications: Secondary | ICD-10-CM | POA: Diagnosis present

## 2019-08-03 HISTORY — PX: APPLICATION OF WOUND VAC: SHX5189

## 2019-08-03 HISTORY — DX: Unspecified fracture of shaft of left tibia, initial encounter for closed fracture: S82.202A

## 2019-08-03 HISTORY — DX: Type 1 diabetes mellitus without complications: E10.9

## 2019-08-03 HISTORY — PX: ORIF ANKLE FRACTURE: SHX5408

## 2019-08-03 LAB — CBC WITH DIFFERENTIAL/PLATELET
Abs Immature Granulocytes: 0.04 10*3/uL (ref 0.00–0.07)
Basophils Absolute: 0.1 10*3/uL (ref 0.0–0.1)
Basophils Relative: 2 %
Eosinophils Absolute: 0.4 10*3/uL (ref 0.0–0.5)
Eosinophils Relative: 7 %
HCT: 36.1 % (ref 36.0–46.0)
Hemoglobin: 12.2 g/dL (ref 12.0–15.0)
Immature Granulocytes: 1 %
Lymphocytes Relative: 16 %
Lymphs Abs: 1 10*3/uL (ref 0.7–4.0)
MCH: 31.1 pg (ref 26.0–34.0)
MCHC: 33.8 g/dL (ref 30.0–36.0)
MCV: 92.1 fL (ref 80.0–100.0)
Monocytes Absolute: 0.5 10*3/uL (ref 0.1–1.0)
Monocytes Relative: 8 %
Neutro Abs: 4.2 10*3/uL (ref 1.7–7.7)
Neutrophils Relative %: 66 %
Platelets: 345 10*3/uL (ref 150–400)
RBC: 3.92 MIL/uL (ref 3.87–5.11)
RDW: 12.8 % (ref 11.5–15.5)
WBC: 6.2 10*3/uL (ref 4.0–10.5)
nRBC: 0 % (ref 0.0–0.2)

## 2019-08-03 LAB — TSH: TSH: 3.571 u[IU]/mL (ref 0.350–4.500)

## 2019-08-03 LAB — PROTIME-INR
INR: 0.9 (ref 0.8–1.2)
Prothrombin Time: 11.7 seconds (ref 11.4–15.2)

## 2019-08-03 LAB — TYPE AND SCREEN
ABO/RH(D): O POS
Antibody Screen: POSITIVE

## 2019-08-03 LAB — COMPREHENSIVE METABOLIC PANEL
ALT: 18 U/L (ref 0–44)
AST: 19 U/L (ref 15–41)
Albumin: 3.8 g/dL (ref 3.5–5.0)
Alkaline Phosphatase: 153 U/L — ABNORMAL HIGH (ref 38–126)
Anion gap: 11 (ref 5–15)
BUN: 17 mg/dL (ref 8–23)
CO2: 24 mmol/L (ref 22–32)
Calcium: 9.1 mg/dL (ref 8.9–10.3)
Chloride: 99 mmol/L (ref 98–111)
Creatinine, Ser: 0.85 mg/dL (ref 0.44–1.00)
GFR calc Af Amer: >60 mL/min (ref >60)
GFR calc non Af Amer: >60 mL/min (ref >60)
Glucose, Bld: 225 mg/dL — ABNORMAL HIGH (ref 70–99)
Potassium: 4 mmol/L (ref 3.5–5.1)
Sodium: 134 mmol/L — ABNORMAL LOW (ref 135–145)
Total Bilirubin: 0.3 mg/dL (ref 0.3–1.2)
Total Protein: 6.5 g/dL (ref 6.5–8.1)

## 2019-08-03 LAB — GLUCOSE, CAPILLARY
Glucose-Capillary: 326 mg/dL — ABNORMAL HIGH (ref 70–99)
Glucose-Capillary: 378 mg/dL — ABNORMAL HIGH (ref 70–99)
Glucose-Capillary: 339 mg/dL — ABNORMAL HIGH (ref 70–99)
Glucose-Capillary: 309 mg/dL — ABNORMAL HIGH (ref 70–99)

## 2019-08-03 LAB — APTT: aPTT: 27 seconds (ref 24–36)

## 2019-08-03 LAB — PREALBUMIN: Prealbumin: 23.2 mg/dL (ref 18–38)

## 2019-08-03 SURGERY — OPEN REDUCTION INTERNAL FIXATION (ORIF) ANKLE FRACTURE
Anesthesia: Monitor Anesthesia Care | Site: Ankle | Laterality: Left

## 2019-08-03 MED ORDER — VENLAFAXINE HCL ER 150 MG PO CP24
150.0000 mg | ORAL_CAPSULE | Freq: Every day | ORAL | Status: DC
  Administered 2019-08-04 – 2019-08-05 (×2): 150 mg via ORAL
  Filled 2019-08-03 (×2): qty 1

## 2019-08-03 MED ORDER — INSULIN GLARGINE 100 UNIT/ML ~~LOC~~ SOLN
14.0000 [IU] | Freq: Every day | SUBCUTANEOUS | Status: DC
  Administered 2019-08-03 – 2019-08-04 (×2): 14 [IU] via SUBCUTANEOUS
  Filled 2019-08-03 (×3): qty 0.14

## 2019-08-03 MED ORDER — BISACODYL 5 MG PO TBEC: 5.0000 mg | DELAYED_RELEASE_TABLET | Freq: Every day | ORAL | Status: DC | PRN

## 2019-08-03 MED ORDER — INSULIN ASPART 100 UNIT/ML ~~LOC~~ SOLN
SUBCUTANEOUS | Status: AC
  Administered 2019-08-03: 11 [IU] via SUBCUTANEOUS
  Filled 2019-08-03: qty 1

## 2019-08-03 MED ORDER — VITAMIN C 500 MG PO TABS
500.0000 mg | ORAL_TABLET | Freq: Every day | ORAL | Status: DC
  Administered 2019-08-03 – 2019-08-05 (×3): 500 mg via ORAL
  Filled 2019-08-03 (×3): qty 1

## 2019-08-03 MED ORDER — SUGAMMADEX SODIUM 200 MG/2ML IV SOLN
INTRAVENOUS | Status: DC | PRN
  Administered 2019-08-03: 150 mg via INTRAVENOUS

## 2019-08-03 MED ORDER — HYDROMORPHONE HCL 1 MG/ML IJ SOLN
0.5000 mg | INTRAMUSCULAR | Status: DC | PRN
  Administered 2019-08-04 – 2019-08-05 (×7): 1 mg via INTRAVENOUS
  Filled 2019-08-03 (×7): qty 1

## 2019-08-03 MED ORDER — OXYCODONE HCL 5 MG/5ML PO SOLN: 5.0000 mg | Freq: Once | ORAL | Status: DC | PRN

## 2019-08-03 MED ORDER — GABAPENTIN 300 MG PO CAPS
300.0000 mg | ORAL_CAPSULE | Freq: Once | ORAL | Status: AC
  Administered 2019-08-03: 300 mg via ORAL
  Filled 2019-08-03: qty 1

## 2019-08-03 MED ORDER — SODIUM CHLORIDE 0.9 % IV SOLN
INTRAVENOUS | Status: DC | PRN
  Administered 2019-08-03: 09:00:00 15 ug/min via INTRAVENOUS

## 2019-08-03 MED ORDER — MIDAZOLAM HCL 2 MG/2ML IJ SOLN
INTRAMUSCULAR | Status: AC
  Administered 2019-08-03: 2 mg via INTRAVENOUS
  Filled 2019-08-03: qty 2

## 2019-08-03 MED ORDER — KETOROLAC TROMETHAMINE 30 MG/ML IJ SOLN: 30.0000 mg | Freq: Once | INTRAMUSCULAR | Status: DC | PRN

## 2019-08-03 MED ORDER — LIDOCAINE 2% (20 MG/ML) 5 ML SYRINGE
INTRAMUSCULAR | Status: DC | PRN
  Administered 2019-08-03: 60 mg via INTRAVENOUS

## 2019-08-03 MED ORDER — ENOXAPARIN SODIUM 40 MG/0.4ML ~~LOC~~ SOLN
40.0000 mg | SUBCUTANEOUS | Status: DC
  Administered 2019-08-04 – 2019-08-05 (×2): 40 mg via SUBCUTANEOUS
  Filled 2019-08-03 (×2): qty 0.4

## 2019-08-03 MED ORDER — ACETAMINOPHEN 325 MG PO TABS
650.0000 mg | ORAL_TABLET | Freq: Three times a day (TID) | ORAL | Status: AC
  Administered 2019-08-03 – 2019-08-04 (×4): 650 mg via ORAL
  Filled 2019-08-03 (×4): qty 2

## 2019-08-03 MED ORDER — CEFAZOLIN SODIUM 1 G IJ SOLR
INTRAMUSCULAR | Status: AC
  Filled 2019-08-03: qty 20

## 2019-08-03 MED ORDER — MIDAZOLAM HCL 2 MG/2ML IJ SOLN
INTRAMUSCULAR | Status: AC
  Filled 2019-08-03: qty 2

## 2019-08-03 MED ORDER — THROMBIN 20000 UNITS EX KIT
PACK | CUTANEOUS | Status: AC
  Filled 2019-08-03: qty 1

## 2019-08-03 MED ORDER — DOCUSATE SODIUM 100 MG PO CAPS
100.0000 mg | ORAL_CAPSULE | Freq: Two times a day (BID) | ORAL | Status: DC
  Administered 2019-08-03 – 2019-08-05 (×5): 100 mg via ORAL
  Filled 2019-08-03 (×5): qty 1

## 2019-08-03 MED ORDER — VITAMIN D 25 MCG (1000 UNIT) PO TABS
2000.0000 [IU] | ORAL_TABLET | Freq: Two times a day (BID) | ORAL | Status: DC
  Administered 2019-08-03 – 2019-08-05 (×5): 2000 [IU] via ORAL
  Filled 2019-08-03 (×5): qty 2

## 2019-08-03 MED ORDER — ROCURONIUM BROMIDE 100 MG/10ML IV SOLN
INTRAVENOUS | Status: DC | PRN
  Administered 2019-08-03: 20 mg via INTRAVENOUS
  Administered 2019-08-03: 50 mg via INTRAVENOUS
  Administered 2019-08-03: 30 mg via INTRAVENOUS
  Administered 2019-08-03: 10 mg via INTRAVENOUS

## 2019-08-03 MED ORDER — CEFAZOLIN SODIUM-DEXTROSE 1-4 GM/50ML-% IV SOLN
1.0000 g | Freq: Four times a day (QID) | INTRAVENOUS | Status: AC
  Administered 2019-08-03 – 2019-08-04 (×3): 1 g via INTRAVENOUS
  Filled 2019-08-03 (×3): qty 50

## 2019-08-03 MED ORDER — MIDAZOLAM HCL 2 MG/2ML IJ SOLN
2.0000 mg | Freq: Once | INTRAMUSCULAR | Status: AC
  Administered 2019-08-03: 07:00:00 2 mg via INTRAVENOUS

## 2019-08-03 MED ORDER — METOCLOPRAMIDE HCL 5 MG/ML IJ SOLN: 5.0000 mg | Freq: Three times a day (TID) | INTRAMUSCULAR | Status: DC | PRN

## 2019-08-03 MED ORDER — LACTATED RINGERS IV SOLN: INTRAVENOUS | Status: DC

## 2019-08-03 MED ORDER — PROPOFOL 1000 MG/100ML IV EMUL
INTRAVENOUS | Status: AC
  Filled 2019-08-03: qty 100

## 2019-08-03 MED ORDER — OXYCODONE HCL 5 MG PO TABS
5.0000 mg | ORAL_TABLET | ORAL | Status: DC | PRN
  Administered 2019-08-04: 10 mg via ORAL
  Filled 2019-08-03 (×3): qty 2

## 2019-08-03 MED ORDER — PROPOFOL 10 MG/ML IV BOLUS
INTRAVENOUS | Status: DC | PRN
  Administered 2019-08-03: 30 mg via INTRAVENOUS
  Administered 2019-08-03: 120 mg via INTRAVENOUS

## 2019-08-03 MED ORDER — POTASSIUM CHLORIDE IN NACL 20-0.9 MEQ/L-% IV SOLN
INTRAVENOUS | Status: DC
  Administered 2019-08-03: 16:00:00 via INTRAVENOUS
  Filled 2019-08-03: qty 1000

## 2019-08-03 MED ORDER — LIDOCAINE HCL (PF) 1 % IJ SOLN
INTRAMUSCULAR | Status: DC | PRN
  Administered 2019-08-03: 5 mL

## 2019-08-03 MED ORDER — ACETAMINOPHEN 325 MG PO TABS: 325.0000 mg | ORAL_TABLET | Freq: Four times a day (QID) | ORAL | Status: DC | PRN

## 2019-08-03 MED ORDER — METOCLOPRAMIDE HCL 5 MG PO TABS: 5.0000 mg | ORAL_TABLET | Freq: Three times a day (TID) | ORAL | Status: DC | PRN

## 2019-08-03 MED ORDER — FENTANYL CITRATE (PF) 100 MCG/2ML IJ SOLN: 25.0000 ug | INTRAMUSCULAR | Status: DC | PRN

## 2019-08-03 MED ORDER — FENTANYL CITRATE (PF) 250 MCG/5ML IJ SOLN
INTRAMUSCULAR | Status: AC
  Filled 2019-08-03: qty 5

## 2019-08-03 MED ORDER — OXYCODONE HCL 5 MG PO TABS: 5.0000 mg | ORAL_TABLET | Freq: Once | ORAL | Status: DC | PRN

## 2019-08-03 MED ORDER — FENTANYL CITRATE (PF) 100 MCG/2ML IJ SOLN
50.0000 ug | Freq: Once | INTRAMUSCULAR | Status: AC
  Administered 2019-08-03: 07:00:00 50 ug via INTRAVENOUS

## 2019-08-03 MED ORDER — INSULIN ASPART 100 UNIT/ML ~~LOC~~ SOLN
0.0000 [IU] | Freq: Every day | SUBCUTANEOUS | Status: DC
  Administered 2019-08-03: 4 [IU] via SUBCUTANEOUS

## 2019-08-03 MED ORDER — POLYETHYLENE GLYCOL 3350 17 G PO PACK
17.0000 g | PACK | Freq: Every day | ORAL | Status: DC
  Administered 2019-08-03 – 2019-08-05 (×2): 17 g via ORAL
  Filled 2019-08-03 (×3): qty 1

## 2019-08-03 MED ORDER — CEFAZOLIN SODIUM-DEXTROSE 2-4 GM/100ML-% IV SOLN
2.0000 g | INTRAVENOUS | Status: AC
  Administered 2019-08-03: 09:00:00 2 g via INTRAVENOUS
  Filled 2019-08-03: qty 100

## 2019-08-03 MED ORDER — PHENYLEPHRINE 40 MCG/ML (10ML) SYRINGE FOR IV PUSH (FOR BLOOD PRESSURE SUPPORT)
PREFILLED_SYRINGE | INTRAVENOUS | Status: DC | PRN
  Administered 2019-08-03: 80 ug via INTRAVENOUS

## 2019-08-03 MED ORDER — ROCURONIUM BROMIDE 10 MG/ML (PF) SYRINGE
PREFILLED_SYRINGE | INTRAVENOUS | Status: AC
  Filled 2019-08-03: qty 30

## 2019-08-03 MED ORDER — ONDANSETRON HCL 4 MG PO TABS: 4.0000 mg | ORAL_TABLET | Freq: Four times a day (QID) | ORAL | Status: DC | PRN

## 2019-08-03 MED ORDER — ONDANSETRON HCL 4 MG/2ML IJ SOLN: 4.0000 mg | Freq: Four times a day (QID) | INTRAMUSCULAR | Status: DC | PRN

## 2019-08-03 MED ORDER — DEXAMETHASONE SODIUM PHOSPHATE 10 MG/ML IJ SOLN
INTRAMUSCULAR | Status: DC | PRN
  Administered 2019-08-03: 10 mg via INTRAVENOUS

## 2019-08-03 MED ORDER — ACETAMINOPHEN 500 MG PO TABS
1000.0000 mg | ORAL_TABLET | Freq: Once | ORAL | Status: AC
  Administered 2019-08-03: 1000 mg via ORAL
  Filled 2019-08-03: qty 2

## 2019-08-03 MED ORDER — LACTATED RINGERS IV SOLN
INTRAVENOUS | Status: DC | PRN
  Administered 2019-08-03 (×2): via INTRAVENOUS

## 2019-08-03 MED ORDER — HEMOSTATIC AGENTS (NO CHARGE) OPTIME
TOPICAL | Status: DC | PRN
  Administered 2019-08-03: 1 via TOPICAL

## 2019-08-03 MED ORDER — METHOCARBAMOL 500 MG PO TABS
500.0000 mg | ORAL_TABLET | Freq: Four times a day (QID) | ORAL | Status: DC | PRN
  Administered 2019-08-04 – 2019-08-05 (×5): 500 mg via ORAL
  Filled 2019-08-03 (×5): qty 1

## 2019-08-03 MED ORDER — LEVOTHYROXINE SODIUM 125 MCG PO TABS
125.0000 ug | ORAL_TABLET | Freq: Every day | ORAL | Status: DC
  Administered 2019-08-04 – 2019-08-05 (×2): 125 ug via ORAL
  Filled 2019-08-03 (×3): qty 1

## 2019-08-03 MED ORDER — ROPIVACAINE HCL 5 MG/ML IJ SOLN
INTRAMUSCULAR | Status: DC | PRN
  Administered 2019-08-03: 40 mL via PERINEURAL

## 2019-08-03 MED ORDER — FENTANYL CITRATE (PF) 250 MCG/5ML IJ SOLN
INTRAMUSCULAR | Status: DC | PRN
  Administered 2019-08-03: 50 ug via INTRAVENOUS
  Administered 2019-08-03: 25 ug via INTRAVENOUS
  Administered 2019-08-03: 50 ug via INTRAVENOUS
  Administered 2019-08-03: 25 ug via INTRAVENOUS
  Administered 2019-08-03: 50 ug via INTRAVENOUS

## 2019-08-03 MED ORDER — PANTOPRAZOLE SODIUM 40 MG PO TBEC
40.0000 mg | DELAYED_RELEASE_TABLET | Freq: Every day | ORAL | Status: DC
  Administered 2019-08-03 – 2019-08-05 (×3): 40 mg via ORAL
  Filled 2019-08-03 (×3): qty 1

## 2019-08-03 MED ORDER — ONDANSETRON HCL 4 MG/2ML IJ SOLN
INTRAMUSCULAR | Status: DC | PRN
  Administered 2019-08-03: 4 mg via INTRAVENOUS

## 2019-08-03 MED ORDER — PROMETHAZINE HCL 25 MG/ML IJ SOLN: 6.2500 mg | INTRAMUSCULAR | Status: DC | PRN

## 2019-08-03 MED ORDER — CHLORHEXIDINE GLUCONATE 4 % EX LIQD: 60.0000 mL | Freq: Once | CUTANEOUS | Status: DC

## 2019-08-03 MED ORDER — METHOCARBAMOL 1000 MG/10ML IJ SOLN
500.0000 mg | Freq: Four times a day (QID) | INTRAVENOUS | Status: DC | PRN
  Filled 2019-08-03: qty 5

## 2019-08-03 MED ORDER — OXYCODONE HCL 5 MG PO TABS
10.0000 mg | ORAL_TABLET | ORAL | Status: DC | PRN
  Administered 2019-08-04: 15 mg via ORAL
  Administered 2019-08-04 (×2): 10 mg via ORAL
  Administered 2019-08-04 – 2019-08-05 (×3): 15 mg via ORAL
  Filled 2019-08-03 (×2): qty 3
  Filled 2019-08-03: qty 2
  Filled 2019-08-03 (×2): qty 3

## 2019-08-03 MED ORDER — INSULIN ASPART 100 UNIT/ML ~~LOC~~ SOLN
0.0000 [IU] | Freq: Three times a day (TID) | SUBCUTANEOUS | Status: DC
  Administered 2019-08-03: 8 [IU] via SUBCUTANEOUS
  Administered 2019-08-03: 13:00:00 11 [IU] via SUBCUTANEOUS
  Administered 2019-08-04: 4 [IU] via SUBCUTANEOUS
  Administered 2019-08-04: 2 [IU] via SUBCUTANEOUS
  Administered 2019-08-04: 3 [IU] via SUBCUTANEOUS
  Administered 2019-08-05: 11 [IU] via SUBCUTANEOUS

## 2019-08-03 MED ORDER — PROPOFOL 10 MG/ML IV BOLUS
INTRAVENOUS | Status: AC
  Filled 2019-08-03: qty 20

## 2019-08-03 MED ORDER — 0.9 % SODIUM CHLORIDE (POUR BTL) OPTIME
TOPICAL | Status: DC | PRN
  Administered 2019-08-03: 1000 mL

## 2019-08-03 MED ORDER — FENTANYL CITRATE (PF) 100 MCG/2ML IJ SOLN
INTRAMUSCULAR | Status: AC
  Administered 2019-08-03: 50 ug via INTRAVENOUS
  Filled 2019-08-03: qty 2

## 2019-08-03 SURGICAL SUPPLY — 91 items
BANDAGE ESMARK 6X9 LF (GAUZE/BANDAGES/DRESSINGS) ×2 IMPLANT
BIT DRILL 2.5X110 QC LCP DISP (BIT) ×1 IMPLANT
BIT DRILL 2.5X2.75 QC CALB (BIT) ×1 IMPLANT
BIT DRILL 3.5X5.5 QC CALB (BIT) ×1 IMPLANT
BIT DRILL CALIBRATED 2.7 (BIT) ×1 IMPLANT
BNDG CMPR 9X6 STRL LF SNTH (GAUZE/BANDAGES/DRESSINGS) ×2
BNDG CMPR MED 10X6 ELC LF (GAUZE/BANDAGES/DRESSINGS) ×2
BNDG ELASTIC 4X5.8 VLCR STR LF (GAUZE/BANDAGES/DRESSINGS) ×1 IMPLANT
BNDG ELASTIC 6X10 VLCR STRL LF (GAUZE/BANDAGES/DRESSINGS) ×1 IMPLANT
BNDG ESMARK 6X9 LF (GAUZE/BANDAGES/DRESSINGS) ×3
BNDG GAUZE ELAST 4 BULKY (GAUZE/BANDAGES/DRESSINGS) ×5 IMPLANT
BRUSH SCRUB EZ PLAIN DRY (MISCELLANEOUS) ×6 IMPLANT
CANISTER WOUND CARE 500ML ATS (WOUND CARE) ×1 IMPLANT
COVER MAYO STAND STRL (DRAPES) ×3 IMPLANT
COVER SURGICAL LIGHT HANDLE (MISCELLANEOUS) ×3 IMPLANT
CUFF TOURN SGL QUICK 34 (TOURNIQUET CUFF) ×3
CUFF TRNQT CYL 34X4.125X (TOURNIQUET CUFF) IMPLANT
DRAPE C-ARM 42X72 X-RAY (DRAPES) ×3 IMPLANT
DRAPE C-ARMOR (DRAPES) ×3 IMPLANT
DRAPE HALF SHEET 40X57 (DRAPES) ×3 IMPLANT
DRAPE INCISE IOBAN 66X45 STRL (DRAPES) ×1 IMPLANT
DRAPE U-SHAPE 47X51 STRL (DRAPES) ×3 IMPLANT
DRSG ADAPTIC 3X8 NADH LF (GAUZE/BANDAGES/DRESSINGS) ×1 IMPLANT
DRSG PAD ABDOMINAL 8X10 ST (GAUZE/BANDAGES/DRESSINGS) ×1 IMPLANT
ELECT REM PT RETURN 9FT ADLT (ELECTROSURGICAL) ×3
ELECTRODE REM PT RTRN 9FT ADLT (ELECTROSURGICAL) ×2 IMPLANT
GAUZE SPONGE 4X4 12PLY STRL (GAUZE/BANDAGES/DRESSINGS) ×5 IMPLANT
GLOVE BIO SURGEON STRL SZ7.5 (GLOVE) ×3 IMPLANT
GLOVE BIO SURGEON STRL SZ8 (GLOVE) ×3 IMPLANT
GLOVE BIOGEL PI IND STRL 7.5 (GLOVE) ×2 IMPLANT
GLOVE BIOGEL PI IND STRL 8 (GLOVE) ×2 IMPLANT
GLOVE BIOGEL PI INDICATOR 7.5 (GLOVE) ×1
GLOVE BIOGEL PI INDICATOR 8 (GLOVE) ×1
GOWN STRL REUS W/ TWL LRG LVL3 (GOWN DISPOSABLE) ×4 IMPLANT
GOWN STRL REUS W/ TWL XL LVL3 (GOWN DISPOSABLE) ×2 IMPLANT
GOWN STRL REUS W/TWL LRG LVL3 (GOWN DISPOSABLE) ×9
GOWN STRL REUS W/TWL XL LVL3 (GOWN DISPOSABLE) ×3
K-WIRE ACE 1.6X6 (WIRE) ×3
KIT BASIN OR (CUSTOM PROCEDURE TRAY) ×3 IMPLANT
KIT INFUSE MEDIUM (Orthopedic Implant) ×1 IMPLANT
KIT TURNOVER KIT B (KITS) ×3 IMPLANT
KWIRE ACE 1.6X6 (WIRE) IMPLANT
MANIFOLD NEPTUNE II (INSTRUMENTS) ×2 IMPLANT
NDL HYPO 21X1.5 SAFETY (NEEDLE) IMPLANT
NEEDLE HYPO 21X1.5 SAFETY (NEEDLE) IMPLANT
NS IRRIG 1000ML POUR BTL (IV SOLUTION) ×3 IMPLANT
PACK GENERAL/GYN (CUSTOM PROCEDURE TRAY) ×3 IMPLANT
PACK ORTHO EXTREMITY (CUSTOM PROCEDURE TRAY) ×3 IMPLANT
PAD ABD 8X10 STRL (GAUZE/BANDAGES/DRESSINGS) ×1 IMPLANT
PAD ARMBOARD 7.5X6 YLW CONV (MISCELLANEOUS) ×6 IMPLANT
PAD CAST 4YDX4 CTTN HI CHSV (CAST SUPPLIES) IMPLANT
PADDING CAST COTTON 4X4 STRL (CAST SUPPLIES) ×6
PADDING CAST COTTON 6X4 STRL (CAST SUPPLIES) ×1 IMPLANT
PLATE 9H LT DIST ANTLAT TIB (Plate) ×3 IMPLANT
PLATE ANTLAT CNTR 156X9 (Plate) IMPLANT
PLATE LCP RECON 3.5 5H/70 (Plate) ×1 IMPLANT
PREVENA RESTOR AXIOFORM 29X28 (GAUZE/BANDAGES/DRESSINGS) ×1 IMPLANT
SCREW CORT 3.5X40 (Screw) ×1 IMPLANT
SCREW CORT FT 32X3.5XNONLOCK (Screw) IMPLANT
SCREW CORTEX 3.5 30MM (Screw) ×1 IMPLANT
SCREW CORTEX 3.5 36MM (Screw) ×2 IMPLANT
SCREW CORTICAL 3.5MM  28MM (Screw) ×4 IMPLANT
SCREW CORTICAL 3.5MM  32MM (Screw) ×1 IMPLANT
SCREW CORTICAL 3.5MM  34MM (Screw) ×1 IMPLANT
SCREW CORTICAL 3.5MM 24MM (Screw) ×1 IMPLANT
SCREW CORTICAL 3.5MM 26MM (Screw) ×1 IMPLANT
SCREW CORTICAL 3.5MM 28MM (Screw) IMPLANT
SCREW CORTICAL 3.5MM 32MM (Screw) ×2 IMPLANT
SCREW CORTICAL 3.5MM 34MM (Screw) IMPLANT
SCREW LOCK 3.5X34 DIST TIB (Screw) ×2 IMPLANT
SCREW LOCK 3.5X38 DIST TIB (Screw) ×1 IMPLANT
SCREW LOCK 3.5X42 DIST TIB (Screw) ×1 IMPLANT
SCREW LOCK CORT ST 3.5X30 (Screw) IMPLANT
SCREW LOCK CORT ST 3.5X36 (Screw) IMPLANT
SCREW LOCK CORT STAR 3.5X38 (Screw) ×1 IMPLANT
SCREW LOCK CORT STAR 3.5X40 (Screw) ×1 IMPLANT
SCREW LOCK T15 FT 32X3.5X2.9X (Screw) IMPLANT
SCREW LOCKING 3.5X32 (Screw) ×3 IMPLANT
SPONGE LAP 18X18 RF (DISPOSABLE) ×4 IMPLANT
STAPLER VISISTAT 35W (STAPLE) ×1 IMPLANT
SUCTION FRAZIER HANDLE 10FR (MISCELLANEOUS) ×1
SUCTION TUBE FRAZIER 10FR DISP (MISCELLANEOUS) ×2 IMPLANT
SUT ETHILON 2 0 PSLX (SUTURE) ×2 IMPLANT
SUT ETHILON 3 0 PS 1 (SUTURE) ×6 IMPLANT
SUT PDS AB 2-0 CT1 27 (SUTURE) IMPLANT
SUT VIC AB 2-0 CT1 27 (SUTURE) ×6
SUT VIC AB 2-0 CT1 TAPERPNT 27 (SUTURE) ×4 IMPLANT
TOWEL GREEN STERILE (TOWEL DISPOSABLE) ×6 IMPLANT
TOWEL GREEN STERILE FF (TOWEL DISPOSABLE) ×3 IMPLANT
TUBE CONNECTING 12X1/4 (SUCTIONS) ×3 IMPLANT
UNDERPAD 30X30 (UNDERPADS AND DIAPERS) ×3 IMPLANT

## 2019-08-03 NOTE — Progress Notes (Signed)
Orthopedic Tech Progress Note Patient Details:  [name] 1951/04/14 [id number]  Ortho Devices Ortho Device/Splint Location: Trapeze bar Ortho Device/Splint Interventions: Application   Post Interventions Patient Tolerated: Well Instructions Provided: Care of device   [name] 08/03/2019, 5:42 PM

## 2019-08-03 NOTE — Anesthesia Procedure Notes (Signed)
Procedure Name: Intubation Date/Time: 08/03/2019 8:32 AM Performed by: Clearnce Sorrel, CRNA Pre-anesthesia Checklist: Patient identified, Emergency Drugs available, Suction available, Patient being monitored and Timeout performed Patient Re-evaluated:Patient Re-evaluated prior to induction Oxygen Delivery Method: Circle system utilized Preoxygenation: Pre-oxygenation with 100% oxygen Induction Type: IV induction Ventilation: Mask ventilation without difficulty and Oral airway inserted - appropriate to patient size Laryngoscope Size: Mac and 3 Grade View: Grade II Tube type: Oral Tube size: 7.0 mm Number of attempts: 1 Airway Equipment and Method: Stylet Placement Confirmation: positive ETCO2 and breath sounds checked- equal and bilateral Secured at: 22 cm Tube secured with: Tape Dental Injury: Teeth and Oropharynx as per pre-operative assessment

## 2019-08-03 NOTE — Transfer of Care (Signed)
Immediate Anesthesia Transfer of Care Note  Patient: [name]  Procedure(s) Performed: OPEN REDUCTION INTERNAL FIXATION (ORIF) ANKLE FRACTURE (Left Ankle) Application Of Wound Vac (Left )  Patient Location: PACU  Anesthesia Type:General and Regional  Level of Consciousness: awake and patient cooperative  Airway & Oxygen Therapy: Patient Spontanous Breathing and Patient connected to nasal cannula oxygen  Post-op Assessment: Report given to RN and Post -op Vital signs reviewed and stable  Post vital signs: Reviewed and stable  Last Vitals:  Vitals Value Taken Time  BP 151/89 08/03/19 1235  Temp    Pulse 93 08/03/19 1237  Resp 16 08/03/19 1237  SpO2 100 % 08/03/19 1237  Vitals shown include unvalidated device data.  Last Pain:  Vitals:   08/03/19 0750  TempSrc:   PainSc: 0-No pain         Complications: No apparent anesthesia complications

## 2019-08-03 NOTE — Op Note (Signed)
[name], [name] MEDICAL RECORD [id number] ACCOUNT [id number] DATE OF BIRTH:[birth year] FACILITY: [facility] LOCATION: [facility] PHYSICIAN:Brisa Auth H. Nyimah Shadduck, MD  OPERATIVE REPORT  DATE OF PROCEDURE:  08/03/2019  PREOPERATIVE DIAGNOSES:  Left extraarticular pilon fracture, subacute.  POSTOPERATIVE DIAGNOSES:  Left extraarticular pilon fracture, subacute.  PROCEDURE:  Open reduction internal fixation of a left extraarticular tibial pilon fracture, tibia only.  Second application of wound VAC.  SURGEON:  Altamese Daphnedale Park, MD  ASSISTANT:  Ainsley Spinner, PA-C.  ANESTHESIA:  General.  COMPLICATIONS:  None.  ESTIMATED BLOOD LOSS:  120 mL.    TOURNIQUET TIME:  Total tourniquet time 124 minutes.  DISPOSITION:  To PACU.  CONDITION:  Stable.  BRIEF SUMMARY FOR PROCEDURE:  This is a 68 year old female with bilateral knee replacements and diabetes that is often not well controlled, who sustained a distal tibial fracture around some prior hardware nearly four weeks ago.  We have followed her in  the office for soft tissue swelling resolution and now she presents for definitive repair.  We did discuss with her the risks and benefits of surgery including the possibility of infection, nerve injury, vessel injury, DVT, failure to obtain a complete  reduction of the fracture, symptomatic hardware, possibility of deep infection that could result in limb loss and multiple others including heart attack and stroke.  After full discussion of these, she did provide consent to proceed.  BRIEF SUMMARY OF PROCEDURE:  The patient was taken to the operating room where general anesthesia was induced.  She did receive preoperative antibiotics.  Her left lower extremity was prepped and draped in the usual sterile fashion with chlorhexidine  wash, Betadine scrub and paint.  A C-arm was brought in to identify the correct starting point for the incision and an anterior slightly lateral incision was selected given the  previous instrumentation from medial, lateral and posterolateral.  I  carefully mobilized the dorsalis pedis and deep peroneal nerve as we incised the retinaculum and mobilized the tendons.  There was an extensive amount of fibrous tissue overlying the periosteum and the fracture site, which was identified with a 10 blade.   There was no bridging bone whatsoever.  Careful and meticulous removal of all of the fibrous material was undertaken exposing the bone edges.  Shortening was considerable.  It was difficult to restore rotation by my PA did do so.  We did have to free  up the neurovascular bundle where it was completely tethered within the fracture callus and pseudocapsule.  We did elevate the leg and exsanguinate it with an Esmarch bandage and inflated the tourniquet at 300 mmHg to reduce bleeding during the case and  exposure of the metaphysis.  The neurovascular bundle was mobilized laterally at the distal edge.  We had to apply a plate to the distal segment using the Synthes set and then a distraction compression device and lamina spreader.  With this series of  techniques, we were ultimately able to mobilize it distally and obtain an anatomic reduction, but it was again considerably difficult given the long period of time since her initial injury.  This was checked with fluoroscopy and then secured with 2 lag  screws from medial to lateral, one of which would later be changed for a shorter screw.  We then removed the stainless hardware and applied the anterolateral pilon plate from [employer].  We first secured a standard fixation proximally and distally and then  locked fixation in the subchondral segment distally.  Final images showed appropriate reduction, hardware trajectory  and length on AP, mortise and lateral.  Wounds were copiously irrigated, tourniquet deflated, hemostasis obtained with electrocautery.   Additional fibrin spray used.  Infuse allograft sponge was placed within this fracture site  where there was a small area of bone loss anteriorly and then along the fracture site posterolaterally as well given the subacute nature and the lack of any bone  growth activity, which was not completely unanticipated given the patient's underlying medical comorbidities.  Standard layer closure was performed with a 2-0 Vicryl and 2-0 nylon.  A sterile gently compressive dressing was applied and then given again  her comorbidities, a wound VAC along with a posterior stirrup splint.  Ainsley Spinner, PA-C, was present and assisting throughout.  PROGNOSIS:  The patient will be nonweightbearing with ice, elevation and strict sugar control.  We remain concerned about her ability to be compliant with all of these instructions and have no doubt her prognosis will be closer correlated with her  ability to do so.  She will be on pharmacologic DVT prophylaxis.  A 1 or 2 day stay in the hospital is anticipated.  TN/NUANCE  D:08/03/2019 T:08/03/2019 JOB:[id number]

## 2019-08-03 NOTE — Anesthesia Procedure Notes (Signed)
Anesthesia Regional Block: Adductor canal block   Pre-Anesthetic Checklist: ,, timeout performed, Correct Patient, Correct Site, Correct Laterality, Correct Procedure, Correct Position, site marked, Risks and benefits discussed,  Surgical consent,  Pre-op evaluation,  At surgeon's request and post-op pain management  Laterality: Left  Prep: Maximum Sterile Barrier Precautions used, chloraprep       Needles:  Injection technique: Single-shot  Needle Type: Echogenic Stimulator Needle     Needle Length: 9cm  Needle Gauge: 22     Additional Needles:   Procedures:,,,, ultrasound used (permanent image in chart),,,,  Narrative:  Start time: 08/03/2019 8:10 AM End time: 08/03/2019 8:20 AM Injection made incrementally with aspirations every 5 mL.  Performed by: Personally  Anesthesiologist: Pervis Hocking, DO  Additional Notes: Monitors applied. No increased pain on injection. No increased resistance to injection. Injection made in 5cc increments. Good needle visualization. Patient tolerated procedure well.

## 2019-08-03 NOTE — Anesthesia Postprocedure Evaluation (Signed)
Anesthesia Post Note  Patient: [name]  Procedure(s) Performed: OPEN REDUCTION INTERNAL FIXATION (ORIF) ANKLE FRACTURE (Left Ankle) Application Of Wound Vac (Left )     Patient location during evaluation: PACU Anesthesia Type: Regional and General Level of consciousness: awake and alert, oriented and patient cooperative Pain management: pain level controlled Vital Signs Assessment: post-procedure vital signs reviewed and stable Respiratory status: spontaneous breathing, nonlabored ventilation and respiratory function stable Cardiovascular status: blood pressure returned to baseline and stable Postop Assessment: no apparent nausea or vomiting Anesthetic complications: no    Last Vitals:  Vitals:   08/03/19 1245 08/03/19 1251  BP: (!) 151/89 116/61  Pulse: 97   Resp: 20   Temp: (!) 36.1 C   SpO2: 100%     Last Pain:  Vitals:   08/03/19 1245  TempSrc:   PainSc: [place]

## 2019-08-03 NOTE — Care Management (Signed)
CM consult acknowledged to assist with any HH/DME needs. Awaiting PT/OT eval for DCP recommendations and will continue to follow.  Fishel Wamble RN, BSN, NCM-BC, ACM-RN [phone] 

## 2019-08-03 NOTE — Plan of Care (Signed)

## 2019-08-03 NOTE — Anesthesia Procedure Notes (Addendum)
Anesthesia Regional Block: Popliteal block   Pre-Anesthetic Checklist: ,, timeout performed, Correct Patient, Correct Site, Correct Laterality, Correct Procedure, Correct Position, site marked, Risks and benefits discussed,  Surgical consent,  Pre-op evaluation,  At surgeon's request and post-op pain management  Laterality: Left  Prep: Maximum Sterile Barrier Precautions used, chloraprep       Needles:  Injection technique: Single-shot  Needle Type: Echogenic Stimulator Needle     Needle Length: 9cm  Needle Gauge: 22     Additional Needles:   Procedures:,,,, ultrasound used (permanent image in chart),,,,  Narrative:  Start time: 08/03/2019 8:20 AM End time: 08/03/2019 8:30 AM Injection made incrementally with aspirations every 5 mL.  Performed by: Personally  Anesthesiologist: Pervis Hocking, DO  Additional Notes: Monitors applied. No increased pain on injection. No increased resistance to injection. Injection made in 5cc increments. Good needle visualization. Patient tolerated procedure well.

## 2019-08-03 NOTE — Brief Op Note (Signed)
08/03/2019  6:51 PM  PATIENT:  [name]  68 y.o. female  PRE-OPERATIVE DIAGNOSIS:  LEFT ANKLE PILON FRACTURE, SUBACUTE  POST-OPERATIVE DIAGNOSIS:  LEFT ANKLE PILON FRACTURE, SUBACUTE  PROCEDURE:  Procedure(s): 1. OPEN REDUCTION INTERNAL FIXATION (ORIF) LEFT EXTRA-ARTICULAR PILON, TIBIA ONLY 2. Application Of Wound Vac (Left)  SURGEON:  Surgeon(s) and Role:    Altamese Aquebogue, MD - Primary  PHYSICIAN ASSISTANT: Ainsley Spinner, PA-C  ANESTHESIA:   general  EBL:  100 mL   BLOOD ADMINISTERED:none  DRAINS: none   LOCAL MEDICATIONS USED:  NONE  SPECIMEN:  No Specimen  DISPOSITION OF SPECIMEN:  N/A  COUNTS:  YES  TOURNIQUET:   Total Tourniquet Time Documented: Thigh (Left) - 124 minutes Total: Thigh (Left) - 124 minutes   DICTATION: .Other Dictation: Dictation Number [id number]  PLAN OF CARE: Admit to inpatient   PATIENT DISPOSITION:  PACU - hemodynamically stable.   Delay start of Pharmacological VTE agent (>24hrs) due to surgical blood loss or risk of bleeding: no

## 2019-08-03 NOTE — Progress Notes (Signed)
cbg 378 after prior insulin, dr Doroteo Glassman notified + has ssi for floor, ok to transport, no further orders

## 2019-08-04 ENCOUNTER — Encounter (HOSPITAL_COMMUNITY): Payer: Self-pay | Admitting: General Practice

## 2019-08-04 LAB — CALCITRIOL (1,25 DI-OH VIT D): Vit D, 1,25-Dihydroxy: 53.4 pg/mL (ref 19.9–79.3)

## 2019-08-04 LAB — BASIC METABOLIC PANEL
Anion gap: 10 (ref 5–15)
BUN: 15 mg/dL (ref 8–23)
CO2: 23 mmol/L (ref 22–32)
Calcium: 8.3 mg/dL — ABNORMAL LOW (ref 8.9–10.3)
Chloride: 99 mmol/L (ref 98–111)
Creatinine, Ser: 0.8 mg/dL (ref 0.44–1.00)
GFR calc Af Amer: >60 mL/min (ref >60)
GFR calc non Af Amer: >60 mL/min (ref >60)
Glucose, Bld: 225 mg/dL — ABNORMAL HIGH (ref 70–99)
Potassium: 3.9 mmol/L (ref 3.5–5.1)
Sodium: 132 mmol/L — ABNORMAL LOW (ref 135–145)

## 2019-08-04 LAB — GLUCOSE, CAPILLARY
Glucose-Capillary: 183 mg/dL — ABNORMAL HIGH (ref 70–99)
Glucose-Capillary: 156 mg/dL — ABNORMAL HIGH (ref 70–99)
Glucose-Capillary: 161 mg/dL — ABNORMAL HIGH (ref 70–99)
Glucose-Capillary: 171 mg/dL — ABNORMAL HIGH (ref 70–99)
Glucose-Capillary: 180 mg/dL — ABNORMAL HIGH (ref 70–99)

## 2019-08-04 LAB — CBC
HCT: 28.1 % — ABNORMAL LOW (ref 36.0–46.0)
Hemoglobin: 9.7 g/dL — ABNORMAL LOW (ref 12.0–15.0)
MCH: 31.3 pg (ref 26.0–34.0)
MCHC: 34.5 g/dL (ref 30.0–36.0)
MCV: 90.6 fL (ref 80.0–100.0)
Platelets: 295 10*3/uL (ref 150–400)
RBC: 3.1 MIL/uL — ABNORMAL LOW (ref 3.87–5.11)
RDW: 12.8 % (ref 11.5–15.5)
WBC: 11 10*3/uL — ABNORMAL HIGH (ref 4.0–10.5)
nRBC: 0 % (ref 0.0–0.2)

## 2019-08-04 LAB — HEMOGLOBIN A1C
Hgb A1c MFr Bld: 7.6 % — ABNORMAL HIGH (ref 4.8–5.6)
Hgb A1c MFr Bld: 7.6 % — ABNORMAL HIGH (ref 4.8–5.6)
Mean Plasma Glucose: 171 mg/dL
Mean Plasma Glucose: 171 mg/dL

## 2019-08-04 LAB — PTH, INTACT AND CALCIUM
Calcium, Total (PTH): 9.1 mg/dL (ref 8.7–10.3)
PTH: 42 pg/mL (ref 15–65)

## 2019-08-04 LAB — VITAMIN D 25 HYDROXY (VIT D DEFICIENCY, FRACTURES): Vit D, 25-Hydroxy: 49.7 ng/mL (ref 30.0–100.0)

## 2019-08-04 MED FILL — Thrombin For Soln Kit 20000 Unit: CUTANEOUS | Qty: 1 | Status: AC

## 2019-08-04 NOTE — Evaluation (Signed)
Physical Therapy Evaluation Patient Details Name: [name] MRN: [id number] DOB: Aug 09, 1951 Today's Date: 08/04/2019   History of Present Illness  Pt is a 68 year old woman admitted for ORIF of her L ankle pilon fx sustained on 07/03/19 while she was hiking. PMH: DM, HTN, depression, B TKA.  Clinical Impression  Pt is nervous both about pain and about mobilizing with pain, but did better the more we moved, needing min to min guard assist for mobility with knee walker.  She is very anxious about the stairs and wants to find a way to do them with only one person assist as she has been putting her arms around two men and they are lifting her up and down the stairs (she doesn't always have two people available).   PT to follow acutely for deficits listed below.    Follow Up Recommendations Home health PT    Equipment Recommendations  None recommended by PT    Recommendations for Other Services   NA    Precautions / Restrictions Precautions Precautions: Fall Required Braces or Orthoses: Other Brace Other Brace: wound vac Restrictions LLE Weight Bearing: Non weight bearing      Mobility  Bed Mobility Overal bed mobility: Modified Independent                Transfers Overall transfer level: Needs assistance Equipment used: (knee scooter) Transfers: Sit to/from Stand Sit to Stand: Min guard         General transfer comment: Min guard assist for safety  Ambulation/Gait Ambulation/Gait assistance: Min guard Gait Distance (Feet): 25 Feet Assistive device: (knee scooter) Gait Pattern/deviations: Step-to pattern(hop to)     General Gait Details: Pt a bit shakey on her foot initially with knee scooter, but was able to hop to the hallway and back with increased confidence on the return trip.           Balance Overall balance assessment: Needs assistance Sitting-balance support: Feet supported;No upper extremity supported Sitting balance-Leahy Scale: Normal      Standing balance support: Bilateral upper extremity supported Standing balance-Leahy Scale: Poor Standing balance comment: needs external support from AD for balance.                              Pertinent Vitals/Pain Pain Assessment: Faces Faces Pain Scale: Hurts whole lot Pain Location: L LE Pain Descriptors / Indicators: Sore;Grimacing;Guarding Pain Intervention(s): Limited activity within patient's tolerance;Monitored during session;Repositioned    Home Living Family/patient expects to be discharged to:: Private residence Living Arrangements: Alone Available Help at Discharge: Friend(s);Available PRN/intermittently Type of Home: House Home Access: Stairs to enter Entrance Stairs-Rails: None Entrance Stairs-Number of Steps: 3 Home Layout: Two level;1/2 bath on main level Home Equipment: Walker - 2 wheels(knee walker) Additional Comments: pt has been sleeping on her couch and sponge bathing in her half bath since her fall    Prior Function Level of Independence: Independent with assistive device(s)         Comments: RW hurt her arms, so she got a knee scooter, two people help her under her arms to get in and out of her house.      Hand Dominance   Dominant Hand: Right    Extremity/Trunk Assessment   Upper Extremity Assessment Upper Extremity Assessment: Defer to OT evaluation    Lower Extremity Assessment Lower Extremity Assessment: LLE deficits/detail LLE Deficits / Details: left leg with normal post op pain and weakness,  sensation in toes intact and pt able to wiggle them, knee and hip ROM and strength WNL, ankle immobilized in plaster splint LLE Sensation: WNL    Cervical / Trunk Assessment Cervical / Trunk Assessment: Normal  Communication   Communication: No difficulties  Cognition Arousal/Alertness: Awake/alert Behavior During Therapy: WFL for tasks assessed/performed Overall Cognitive Status: Within Functional Limits for tasks assessed                                  General Comments: Pt is understandably anxioous on her feet.       General Comments General comments (skin integrity, edema, etc.): We spoke at length re: strategy for stair training (reverse with RW and one person assist.  She is very anxious about this plan.         Assessment/Plan    PT Assessment Patient needs continued PT services  PT Problem List Decreased strength;Decreased range of motion;Decreased activity tolerance;Decreased balance;Decreased mobility;Decreased knowledge of use of DME;Pain       PT Treatment Interventions DME instruction;Gait training;Stair training;Functional mobility training;Therapeutic activities;Therapeutic exercise;Balance training;Patient/family education;Modalities;Wheelchair mobility training    PT Goals (Current goals can be found in the Care Plan section)  Acute Rehab PT Goals Patient Stated Goal: to be safe and start the healing process PT Goal Formulation: With patient Time For Goal Achievement: 08/18/19 Potential to Achieve Goals: Good    Frequency Min 5X/week   Barriers to discharge Inaccessible home environment Pt has significant difficulty with steps right now.       AM-PAC PT "6 Clicks" Mobility  Outcome Measure Help needed turning from your back to your side while in a flat bed without using bedrails?: None Help needed moving from lying on your back to sitting on the side of a flat bed without using bedrails?: None Help needed moving to and from a bed to a chair (including a wheelchair)?: A Little Help needed standing up from a chair using your arms (e.g., wheelchair or bedside chair)?: A Little Help needed to walk in hospital room?: A Little Help needed climbing 3-5 steps with a railing? : A Lot 6 Click Score: 19    End of Session Equipment Utilized During Treatment: Gait belt Activity Tolerance: Patient limited by fatigue;Patient limited by pain Patient left: in bed;with call  bell/phone within reach   PT Visit Diagnosis: Muscle weakness (generalized) (M62.81);Difficulty in walking, not elsewhere classified (R26.2);Pain Pain - Right/Left: Left Pain - part of body: Leg    Time: [id number] PT Time Calculation (min) (ACUTE ONLY): 20 min   Charges:       Wells Guiles B. Zita Ozimek, PT, DPT  Acute Rehabilitation [phone] pager [phone]) [phone] office  @ [facility]: [phone]   PT Evaluation $PT Eval Moderate Complexity: 1 Mod         08/04/2019, 7:17 PM

## 2019-08-04 NOTE — Evaluation (Signed)
Occupational Therapy Evaluation Patient Details Name: [name] MRN: [id number] DOB: 11/21/1950 Today's Date: 08/04/2019    History of Present Illness Pt is a 68 year old woman admitted for ORIF of her L ankle pilon fx sustained on 07/03/19 while she was hiking. PMH: DM, HTN, depression, B TKA.   Clinical Impression   Pt supervised for OOB mobility and ADL due to lines and report of feeling mildly "woozy." No further OT needs. Pt has figured out how to manage at home using a knee scooter as she has been NWB since her fall one month ago.     Follow Up Recommendations  No OT follow up    Equipment Recommendations  None recommended by OT    Recommendations for Other Services       Precautions / Restrictions Precautions Precautions: Fall Restrictions Weight Bearing Restrictions: Yes LLE Weight Bearing: Non weight bearing      Mobility Bed Mobility Overal bed mobility: Modified Independent                Transfers Overall transfer level: Needs assistance Equipment used: Rolling walker (2 wheeled) Transfers: Sit to/from Stand Sit to Stand: Supervision         General transfer comment: supervision for safety and lines    Balance Overall balance assessment: Needs assistance   Sitting balance-Leahy Scale: Normal     Standing balance support: Bilateral upper extremity supported Standing balance-Leahy Scale: Poor                             ADL either performed or assessed with clinical judgement   ADL                                         General ADL Comments: Pt is functioning at set up to supervision level for safety and lines.     Vision Baseline Vision/History: Wears glasses Wears Glasses: Reading only       Perception     Praxis      Pertinent Vitals/Pain Pain Assessment: Faces Faces Pain Scale: Hurts even more Pain Location: L LE Pain Descriptors / Indicators: Sore;Grimacing;Guarding Pain  Intervention(s): Monitored during session;Repositioned;Patient requesting pain meds-RN notified;RN gave pain meds during session     Hand Dominance Right   Extremity/Trunk Assessment Upper Extremity Assessment Upper Extremity Assessment: Overall WFL for tasks assessed   Lower Extremity Assessment Lower Extremity Assessment: Defer to PT evaluation   Cervical / Trunk Assessment Cervical / Trunk Assessment: Normal   Communication Communication Communication: No difficulties   Cognition Arousal/Alertness: Awake/alert Behavior During Therapy: WFL for tasks assessed/performed Overall Cognitive Status: Within Functional Limits for tasks assessed                                     General Comments       Exercises     Shoulder Instructions      Home Living Family/patient expects to be discharged to:: Private residence Living Arrangements: Alone Available Help at Discharge: Friend(s);Available PRN/intermittently Type of Home: House Home Access: Stairs to enter [employer] of Steps: 3 Entrance Stairs-Rails: None Home Layout: Two level;1/2 bath on main level Alternate Level Stairs-Number of Steps: Building control surveyor: Standard     Home  Equipment: Gilford Rile - 2 wheels;Other (comment)(knee scooter)   Additional Comments: pt has been sleeping on her couch and sponge bathing in her half bath since her fall      Prior Functioning/Environment Level of Independence: Independent with assistive device(s)        Comments: RW hurt her arms, so she got a knee scooter        OT Problem List:        OT Treatment/Interventions:      OT Goals(Current goals can be found in the care plan section) Acute Rehab OT Goals Patient Stated Goal: to be able to see her 52 month old grandson near [place]  OT Frequency:     Barriers to D/C:            Co-evaluation              AM-PAC OT "6 Clicks" Daily Activity     Outcome Measure Help from  another person eating meals?: None Help from another person taking care of personal grooming?: A Little Help from another person toileting, which includes using toliet, bedpan, or urinal?: A Little Help from another person bathing (including washing, rinsing, drying)?: A Little Help from another person to put on and taking off regular upper body clothing?: None Help from another person to put on and taking off regular lower body clothing?: A Little 6 Click Score: 20   End of Session Equipment Utilized During Treatment: Gait belt;Rolling walker Nurse Communication: Patient requests pain meds  Activity Tolerance: Patient tolerated treatment well Patient left: in chair;with call bell/phone within reach;with nursing/sitter in room  OT Visit Diagnosis: Unsteadiness on feet (R26.81);Other abnormalities of gait and mobility (R26.89);Pain;History of falling (Z91.81)                Time: [id number] OT Time Calculation (min): 24 min Charges:  OT General Charges $OT Visit: 1 Visit OT Evaluation $OT Eval Moderate Complexity: 1 Mod OT Treatments $Self Care/Home Management : 8-22 mins  Nestor Lewandowsky, OTR/L Acute Rehabilitation Services Pager: [phone] Office: [phone]  Malka So 08/04/2019, 8:45 AM

## 2019-08-04 NOTE — Progress Notes (Addendum)
Orthopedic Trauma Service Progress Note  Patient ID: [name] MRN: [id number] DOB/AGE: 1951-09-04 68 y.o.  Subjective:  Doing ok Pain moderate Block wore off aroun 0300 No additional issues  Has been up in chair for several hours already    Review of Systems  Constitutional: Negative for chills and fever.  Respiratory: Negative for shortness of breath and wheezing.   Cardiovascular: Negative for chest pain and palpitations.  Gastrointestinal: Negative for abdominal pain, nausea and vomiting.  Neurological: Negative for tingling and sensory change.    Objective:   VITALS:   Vitals:   08/03/19 2026 08/04/19 0026 08/04/19 0531 08/04/19 0754  BP: 116/61 114/65 138/76 116/68  Pulse: 86 88 92 84  Resp: 18 17  16   Temp: 98.4 F (36.9 C) 98.1 F (36.7 C) 98.4 F (36.9 C) 98.4 F (36.9 C)  TempSrc: Oral Oral Oral Oral  SpO2: 97% 97% 100% 94%  Weight:      Height:        Estimated body mass index is 25.75 kg/m as calculated from the following:   Height as of this encounter: 5\' 4"  (1.626 m).   Weight as of this encounter: 68 kg.   Intake/Output      09/15 0701 - 09/16 0700 09/16 0701 - 09/17 0700   I.V. (mL/kg) 2034.4 (29.9)    IV Piggyback 150    Total Intake(mL/kg) 2184.4 (32.1)    Urine (mL/kg/hr) 0 (0)    Drains 0    Stool 0    Blood 100    Total Output 100    Net +2084.4         Urine Occurrence 600 x    Stool Occurrence 0 x      LABS  Results for orders placed or performed during the hospital encounter of 08/03/19 (from the past 24 hour(s))  Glucose, capillary     Status: Abnormal   Collection Time: 08/03/19 12:40 PM  Result Value Ref Range   Glucose-Capillary 326 (H) 70 - 99 mg/dL  Glucose, capillary     Status: Abnormal   Collection Time: 08/03/19  1:36 PM  Result Value Ref Range   Glucose-Capillary 378 (H) 70 - 99 mg/dL   Comment 1 Notify RN    Comment 2 Document  in Chart   Hemoglobin A1c     Status: Abnormal   Collection Time: 08/03/19  4:04 PM  Result Value Ref Range   Hgb A1c MFr Bld 7.6 (H) 4.8 - 5.6 %   Mean Plasma Glucose 171 mg/dL  Glucose, capillary     Status: Abnormal   Collection Time: 08/03/19  4:54 PM  Result Value Ref Range   Glucose-Capillary 339 (H) 70 - 99 mg/dL  Glucose, capillary     Status: Abnormal   Collection Time: 08/03/19  9:35 PM  Result Value Ref Range   Glucose-Capillary 309 (H) 70 - 99 mg/dL  Basic metabolic panel     Status: Abnormal   Collection Time: 08/04/19  2:51 AM  Result Value Ref Range   Sodium 132 (L) 135 - 145 mmol/L   Potassium 3.9 3.5 - 5.1 mmol/L   Chloride 99 98 - 111 mmol/L   CO2 23 22 - 32 mmol/L   Glucose, Bld 225 (H) 70 - 99 mg/dL   BUN 15 8 -  23 mg/dL   Creatinine, Ser 0.80 0.44 - 1.00 mg/dL   Calcium 8.3 (L) 8.9 - 10.3 mg/dL   GFR calc non Af Amer >60 >60 mL/min   GFR calc Af Amer >60 >60 mL/min   Anion gap 10 5 - 15  CBC     Status: Abnormal   Collection Time: 08/04/19  2:51 AM  Result Value Ref Range   WBC 11.0 (H) 4.0 - 10.5 K/uL   RBC 3.10 (L) 3.87 - 5.11 MIL/uL   Hemoglobin 9.7 (L) 12.0 - 15.0 g/dL   HCT 28.1 (L) 36.0 - 46.0 %   MCV 90.6 80.0 - 100.0 fL   MCH 31.3 26.0 - 34.0 pg   MCHC 34.5 30.0 - 36.0 g/dL   RDW 12.8 11.5 - 15.5 %   Platelets 295 150 - 400 K/uL   nRBC 0.0 0.0 - 0.2 %  Glucose, capillary     Status: Abnormal   Collection Time: 08/04/19  6:51 AM  Result Value Ref Range   Glucose-Capillary 183 (H) 70 - 99 mg/dL  Glucose, capillary     Status: Abnormal   Collection Time: 08/04/19  9:04 AM  Result Value Ref Range   Glucose-Capillary 156 (H) 70 - 99 mg/dL    CBG (last 3)  Recent Labs    08/03/19 2135 08/04/19 0651 08/04/19 0904  GLUCAP 309* 183* 156*    PHYSICAL EXAM:   Gen: sitting up in chair, NAD, appears well Lungs: unlabored, clear anterior fields Cardiac: regular  Abd: NTND, + BS  Ext:       Left Lower Extremity   Splint fitting well   Splint c/d/i  Ext warm   + DP pulse  EHL, FHL, lesser toe motor grossly intact  DPN, SPN, TN sensation grossly intact   No pain with passive stretching   Assessment/Plan: 1 Day Post-Op   Principal Problem:   Left tibial fracture Active Problems:   Asthma   Essential hypertension   Type 2 diabetes mellitus without complication (HCC)   Anti-infectives (From admission, onward)   Start     Dose/Rate Route Frequency Ordered Stop   08/03/19 1400  ceFAZolin (ANCEF) IVPB 1 g/50 mL premix     1 g 100 mL/hr over 30 Minutes Intravenous Every 6 hours 08/03/19 1349 08/04/19 0202   08/03/19 0700  ceFAZolin (ANCEF) IVPB 2g/100 mL premix     2 g 200 mL/hr over 30 Minutes Intravenous On call to O.R. 08/03/19 [id number] 08/03/19 0834    .  POD/HD#: 1  68 y/o female with subacute extra-articular left pilon fracture   -subacute extra-articular L pilon fracture s/p ORIF  NWB x 8 weeks  Ice and elevate for swelling and pain control   Elevate ankle above heart as much as possible  Ok to mobilize with walker or rolling knee scooter  PT/OT  Splint x 2 weeks then transition to CAM   Prevena to remain on until Splint removed  - Pain management:  Dc oxy   Try norco   Robaxin    - ABL anemia/Hemodynamics  Stable   Monitor   - Medical issues   DM    a1c is 7.6%   Continue with SSI for now   Lantus 14 units at bedtime (PTA dosage)   Diabetes coordinator eval    Imperative to keep cbgs <200 to minimize changes of infection and nonunion    HTN   Pressures look good   Continue to hold BP meds for now    -  DVT/PE prophylaxis:  lovenox x 21 days   - ID:   perio op abx  - Metabolic Bone Disease:  Labs look good  Continue with home supplementation regimen   - Activity:  NWB L leg  - FEN/GI prophylaxis/Foley/Lines:  Carb mod diet  KVO IVF  -Ex-fix/Splint care:  Keep splint clean and dry     - Impediments to fracture healing:  DM  Subacute fracture  - Dispo:  Pain control   Home tomorrow     Stephanie Pigg, PA-C [phone] (C) 08/04/2019, 10:28 AM  [facility] [place] [address] [phone] Domingo Sep (F)

## 2019-08-05 LAB — GLUCOSE, CAPILLARY
Glucose-Capillary: 335 mg/dL — ABNORMAL HIGH (ref 70–99)
Glucose-Capillary: 65 mg/dL — ABNORMAL LOW (ref 70–99)
Glucose-Capillary: 211 mg/dL — ABNORMAL HIGH (ref 70–99)

## 2019-08-05 LAB — BASIC METABOLIC PANEL
Anion gap: 9 (ref 5–15)
BUN: 12 mg/dL (ref 8–23)
CO2: 25 mmol/L (ref 22–32)
Calcium: 8.6 mg/dL — ABNORMAL LOW (ref 8.9–10.3)
Chloride: 97 mmol/L — ABNORMAL LOW (ref 98–111)
Creatinine, Ser: 0.85 mg/dL (ref 0.44–1.00)
GFR calc Af Amer: >60 mL/min (ref >60)
GFR calc non Af Amer: >60 mL/min (ref >60)
Glucose, Bld: 336 mg/dL — ABNORMAL HIGH (ref 70–99)
Potassium: 5.3 mmol/L — ABNORMAL HIGH (ref 3.5–5.1)
Sodium: 131 mmol/L — ABNORMAL LOW (ref 135–145)

## 2019-08-05 LAB — CBC
HCT: 30.8 % — ABNORMAL LOW (ref 36.0–46.0)
Hemoglobin: 9.9 g/dL — ABNORMAL LOW (ref 12.0–15.0)
MCH: 30.3 pg (ref 26.0–34.0)
MCHC: 32.1 g/dL (ref 30.0–36.0)
MCV: 94.2 fL (ref 80.0–100.0)
Platelets: 300 10*3/uL (ref 150–400)
RBC: 3.27 MIL/uL — ABNORMAL LOW (ref 3.87–5.11)
RDW: 13 % (ref 11.5–15.5)
WBC: 8.7 10*3/uL (ref 4.0–10.5)
nRBC: 0 % (ref 0.0–0.2)

## 2019-08-05 MED ORDER — HYDROCODONE-ACETAMINOPHEN 5-325 MG PO TABS: 1.0000 | ORAL_TABLET | Freq: Four times a day (QID) | ORAL | 0 refills | Status: DC | PRN

## 2019-08-05 MED ORDER — ENOXAPARIN (LOVENOX) PATIENT EDUCATION KIT: 1.0000 | PACK | Freq: Once | 0 refills | Status: AC

## 2019-08-05 MED ORDER — ASCORBIC ACID 500 MG PO TABS: 500.0000 mg | ORAL_TABLET | Freq: Every day | ORAL | 1 refills | Status: DC

## 2019-08-05 MED ORDER — ENOXAPARIN (LOVENOX) PATIENT EDUCATION KIT
PACK | Freq: Once | Status: AC
  Administered 2019-08-05: 16:00:00
  Filled 2019-08-05: qty 1

## 2019-08-05 MED ORDER — METHOCARBAMOL 500 MG PO TABS: 500.0000 mg | ORAL_TABLET | Freq: Four times a day (QID) | ORAL | 1 refills | Status: DC

## 2019-08-05 MED ORDER — METHOCARBAMOL 500 MG PO TABS: 500.0000 mg | ORAL_TABLET | Freq: Four times a day (QID) | ORAL | Status: DC

## 2019-08-05 MED ORDER — DOCUSATE SODIUM 100 MG PO CAPS: 100.0000 mg | ORAL_CAPSULE | Freq: Two times a day (BID) | ORAL | 1 refills | Status: DC

## 2019-08-05 MED ORDER — ACETAMINOPHEN 500 MG PO TABS
500.0000 mg | ORAL_TABLET | Freq: Three times a day (TID) | ORAL | Status: DC
  Administered 2019-08-05: 500 mg via ORAL

## 2019-08-05 MED ORDER — HYDROCODONE-ACETAMINOPHEN 7.5-325 MG PO TABS
1.0000 | ORAL_TABLET | Freq: Four times a day (QID) | ORAL | Status: DC | PRN
  Administered 2019-08-05: 2 via ORAL
  Filled 2019-08-05: qty 2

## 2019-08-05 MED ORDER — ACETAMINOPHEN 500 MG PO TABS: 500.0000 mg | ORAL_TABLET | Freq: Two times a day (BID) | ORAL | 0 refills | Status: DC

## 2019-08-05 MED ORDER — ENOXAPARIN SODIUM 40 MG/0.4ML ~~LOC~~ SOLN: 40.0000 mg | SUBCUTANEOUS | 0 refills | Status: DC

## 2019-08-05 MED ORDER — METHOCARBAMOL 1000 MG/10ML IJ SOLN
500.0000 mg | Freq: Four times a day (QID) | INTRAVENOUS | Status: DC
  Filled 2019-08-05 (×2): qty 5

## 2019-08-05 NOTE — Progress Notes (Signed)
Orthopedic Trauma Service Progress Note  Patient ID: Finna Mathiasen MRN: [id number] DOB/AGE: January 29, 1951 68 y.o.  Subjective: Doing ok Still some issues with pain control but overall improved   Ready to go home today   Review of Systems  Constitutional: Negative for chills and fever.  Respiratory: Negative for shortness of breath and wheezing.   Cardiovascular: Negative for chest pain and palpitations.  Gastrointestinal: Negative for abdominal pain, nausea and vomiting.    Objective:   VITALS:   Vitals:   08/05/19 0326 08/05/19 0328 08/05/19 0415 08/05/19 0828  BP: (!) 149/77 (!) 148/74  139/64  Pulse: (!) 114 (!) 115 95 (!) 103  Resp:  18  16  Temp: 98.3 F (36.8 C)   98.4 F (36.9 C)  TempSrc: Oral   Oral  SpO2: (!) 89% 90%  94%  Weight:      Height:        Estimated body mass index is 25.75 kg/m as calculated from the following:   Height as of this encounter: 5\' 4"  (1.626 m).   Weight as of this encounter: 68 kg.   Intake/Output      09/16 0701 - 09/17 0700 09/17 0701 - 09/18 0700   P.O. 480 240   I.V. (mL/kg)     IV Piggyback     Total Intake(mL/kg) 480 (7.1) 240 (3.5)   Urine (mL/kg/hr)     Drains     Stool     Blood     Total Output     Net +480 +240        Urine Occurrence 1300 x      LABS  Results for orders placed or performed during the hospital encounter of 08/03/19 (from the past 24 hour(s))  Glucose, capillary     Status: Abnormal   Collection Time: 08/04/19  4:19 PM  Result Value Ref Range   Glucose-Capillary 171 (H) 70 - 99 mg/dL  Glucose, capillary     Status: Abnormal   Collection Time: 08/04/19  9:32 PM  Result Value Ref Range   Glucose-Capillary 180 (H) 70 - 99 mg/dL  Basic metabolic panel     Status: Abnormal   Collection Time: 08/05/19  4:01 AM  Result Value Ref Range   Sodium 131 (L) 135 - 145 mmol/L   Potassium 5.3 (H) 3.5 - 5.1 mmol/L   Chloride 97  (L) 98 - 111 mmol/L   CO2 25 22 - 32 mmol/L   Glucose, Bld 336 (H) 70 - 99 mg/dL   BUN 12 8 - 23 mg/dL   Creatinine, Ser 0.85 0.44 - 1.00 mg/dL   Calcium 8.6 (L) 8.9 - 10.3 mg/dL   GFR calc non Af Amer >60 >60 mL/min   GFR calc Af Amer >60 >60 mL/min   Anion gap 9 5 - 15  CBC     Status: Abnormal   Collection Time: 08/05/19  4:01 AM  Result Value Ref Range   WBC 8.7 4.0 - 10.5 K/uL   RBC 3.27 (L) 3.87 - 5.11 MIL/uL   Hemoglobin 9.9 (L) 12.0 - 15.0 g/dL   HCT 30.8 (L) 36.0 - 46.0 %   MCV 94.2 80.0 - 100.0 fL   MCH 30.3 26.0 - 34.0 pg   MCHC 32.1 30.0 - 36.0 g/dL   RDW 13.0 11.5 -  15.5 %   Platelets 300 150 - 400 K/uL   nRBC 0.0 0.0 - 0.2 %  Glucose, capillary     Status: Abnormal   Collection Time: 08/05/19  6:48 AM  Result Value Ref Range   Glucose-Capillary 335 (H) 70 - 99 mg/dL  Glucose, capillary     Status: Abnormal   Collection Time: 08/05/19 12:01 PM  Result Value Ref Range   Glucose-Capillary 65 (L) 70 - 99 mg/dL     PHYSICAL EXAM:     Gen: sitting up in chair, NAD, appears well Ext:       Left Lower Extremity              Splint fitting well             Splint c/d/i             Ext warm              + DP pulse             EHL, FHL, lesser toe motor grossly intact             DPN, SPN, TN sensation grossly intact              No pain with passive stretching   Changed over to prevena vac canister     Assessment/Plan: 2 Days Post-Op   Principal Problem:   Left tibial fracture Active Problems:   Asthma   Essential hypertension   Type 2 diabetes mellitus without complication (HCC)   Anti-infectives (From admission, onward)   Start     Dose/Rate Route Frequency Ordered Stop   08/03/19 1400  ceFAZolin (ANCEF) IVPB 1 g/50 mL premix     1 g 100 mL/hr over 30 Minutes Intravenous Every 6 hours 08/03/19 1349 08/04/19 0202   08/03/19 0700  ceFAZolin (ANCEF) IVPB 2g/100 mL premix     2 g 200 mL/hr over 30 Minutes Intravenous On call to O.R. 08/03/19 [id number]  08/03/19 0834    .  POD/HD#: 2  68 y/o female with subacute extra-articular left pilon fracture    -subacute extra-articular L pilon fracture s/p ORIF             NWB x 8 weeks             Ice and elevate for swelling and pain control                         Elevate ankle above heart as much as possible             Ok to mobilize with walker or rolling knee scooter             PT/OT             Splint x 2 weeks then transition to CAM              Prevena to remain on until Splint removed   - Pain management:             norco   Robaxin               - ABL anemia/Hemodynamics             Stable              Monitor    - Medical issues              DM  stable                HTN                         Pressures look good                       resume home meds               - DVT/PE prophylaxis:             lovenox x 14 days   - ID:              perio op abx   - Metabolic Bone Disease:             Labs look good             Continue with home supplementation regimen    - Activity:             NWB L leg   - FEN/GI prophylaxis/Foley/Lines:             Carb mod diet             dc IV and IVF    -Ex-fix/Splint care:             Keep splint clean and dry                     - Impediments to fracture healing:             DM             Subacute fracture   - Dispo:            dc home today   Follow up 08/16/2019   [name], PA-C [phone] (C) 08/05/2019, 3:01 PM  [facility] [place] [address] [phone] Domingo Sep (F)

## 2019-08-05 NOTE — Discharge Instructions (Signed)
Orthopaedic Trauma Service Discharge Instructions   General Discharge Instructions  WEIGHT BEARING STATUS: Nonweightbearing left leg  RANGE OF MOTION/ACTIVITY: Unrestricted range of motion left knee and toes.  Activity as tolerated while maintaining weightbearing restrictions  Bone health: Vitamin D labs look really good continue to take vitamin D supplementation as you have been doing.  Please also take vitamin C 500 mg daily to help with soft tissue healing  Wound Care: Keep splint clean and dry.  Do not remove splint.  Please make sure that the Prevena wound VAC is charged as much as possible.  Recommend plugging at night.  Okay to disconnect from a power source when moving around.  We will remove the Prevena and splint at your office visit  DVT/PE prophylaxis: Lovenox 40 mg subcutaneous injection daily x 14 days   Diet: as you were eating previously.  Can use over the counter stool softeners and bowel preparations, such as Miralax, to help with bowel movements.  Narcotics can be constipating.  Be sure to drink plenty of fluids  PAIN MEDICATION USE AND EXPECTATIONS  You have likely been given narcotic medications to help control your pain.  After a traumatic event that results in an fracture (broken bone) with or without surgery, it is ok to use narcotic pain medications to help control one's pain.  We understand that everyone responds to pain differently and each individual patient will be evaluated on a regular basis for the continued need for narcotic medications. Ideally, narcotic medication use should last no more than 6-8 weeks (coinciding with fracture healing).   As a patient it is your responsibility as well to monitor narcotic medication use and report the amount and frequency you use these medications when you come to your office visit.   We would also advise that if you are using narcotic medications, you should take a dose prior to therapy to maximize you participation.  IF  YOU ARE ON NARCOTIC MEDICATIONS IT IS NOT PERMISSIBLE TO OPERATE A MOTOR VEHICLE (MOTORCYCLE/CAR/TRUCK/MOPED) OR HEAVY MACHINERY DO NOT MIX NARCOTICS WITH OTHER CNS (CENTRAL NERVOUS SYSTEM) DEPRESSANTS SUCH AS ALCOHOL   STOP SMOKING OR USING NICOTINE PRODUCTS!!!!  As discussed nicotine severely impairs your body's ability to heal surgical and traumatic wounds but also impairs bone healing.  Wounds and bone heal by forming microscopic blood vessels (angiogenesis) and nicotine is a vasoconstrictor (essentially, shrinks blood vessels).  Therefore, if vasoconstriction occurs to these microscopic blood vessels they essentially disappear and are unable to deliver necessary nutrients to the healing tissue.  This is one modifiable factor that you can do to dramatically increase your chances of healing your injury.    (This means no smoking, no nicotine gum, patches, etc)  DO NOT USE NONSTEROIDAL ANTI-INFLAMMATORY DRUGS (NSAID'S)  Using products such as Advil (ibuprofen), Aleve (naproxen), Motrin (ibuprofen) for additional pain control during fracture healing can delay and/or prevent the healing response.  If you would like to take over the counter (OTC) medication, Tylenol (acetaminophen) is ok.  However, some narcotic medications that are given for pain control contain acetaminophen as well. Therefore, you should not exceed more than 4000 mg of tylenol in a day if you do not have liver disease.  Also note that there are may OTC medicines, such as cold medicines and allergy medicines that my contain tylenol as well.  If you have any questions about medications and/or interactions please ask your doctor/PA or your pharmacist.      ICE AND ELEVATE INJURED/OPERATIVE  EXTREMITY  Using ice and elevating the injured extremity above your heart can help with swelling and pain control.  Icing in a pulsatile fashion, such as 20 minutes on and 20 minutes off, can be followed.    Do not place ice directly on skin. Make  sure there is a barrier between to skin and the ice pack.    Using frozen items such as frozen peas works well as the conform nicely to the are that needs to be iced.  USE AN ACE WRAP OR TED HOSE FOR SWELLING CONTROL  In addition to icing and elevation, Ace wraps or TED hose are used to help limit and resolve swelling.  It is recommended to use Ace wraps or TED hose until you are informed to stop.    When using Ace Wraps start the wrapping distally (farthest away from the body) and wrap proximally (closer to the body)   Example: If you had surgery on your leg or thing and you do not have a splint on, start the ace wrap at the toes and work your way up to the thigh        If you had surgery on your upper extremity and do not have a splint on, start the ace wrap at your fingers and work your way up to the upper arm  IF YOU ARE IN A SPLINT OR CAST DO NOT [place]   If your splint gets wet for any reason please contact the office immediately. You may shower in your splint or cast as long as you keep it dry.  This can be done by wrapping in a cast cover or garbage back (or similar)  Do Not stick any thing down your splint or cast such as pencils, money, or hangers to try and scratch yourself with.  If you feel itchy take benadryl as prescribed on the bottle for itching  IF YOU ARE IN A CAM BOOT (BLACK BOOT)  You may remove boot periodically. Perform daily dressing changes as noted below.  Wash the liner of the boot regularly and wear a sock when wearing the boot. It is recommended that you sleep in the boot until told otherwise    Call office for the following:  Temperature greater than 101F  Persistent nausea and vomiting  Severe uncontrolled pain  Redness, tenderness, or signs of infection (pain, swelling, redness, odor or green/yellow discharge around the site)  Difficulty breathing, headache or visual disturbances  Hives  Persistent dizziness or  light-headedness  Extreme fatigue  Any other questions or concerns you may have after discharge  In an emergency, call 911 or go to an Emergency Department at a nearby hospital    [place]: [phone]   VISIT OUR WEBSITE FOR ADDITIONAL INFORMATION: [email or web address]

## 2019-08-05 NOTE — Progress Notes (Addendum)
Spoke with patient at the bedside about her diabetes. States that she was diagnosed 33 years ago at the age of 67. Started on NPH once a day at that time. HgbA1C is 7.6% which is very good especially for her age at this time. She states that she fractured her tibia when she was hiking; very active person. States that she lives alone, but cooks with a friend frequently. Currently taking Lantus 14 units every HS, and Novolog 2-3 units sliding scale with each meal. Sees Dr. Purnell Shoemaker for her blood glucose control. Seems that she knows and cares about her diabetes and is doing well with her control as indicated by her hgbA1C.   Harvel Ricks RN BSN CDE Diabetes Coordinator Pager: [phone]  8am-5pm

## 2019-08-05 NOTE — TOC Transition Note (Signed)
Transition of Care [facility]) - CM/SW Discharge Note   Patient Details  Name: [name] MRN: [id number] Date of Birth: 22-Jul-1951  [facility] CM/SW Contact:  Midge Minium RN, BSN, NCM-BC, ACM-RN [phone] Phone Number: 08/05/2019, 3:31 PM   Clinical Narrative:    CM following for TOC needs. Patient is s/p ORIF L ankle pilon fx sustained on 07/03/19. Patient states living at home alone and being independent prior to her injury. PCP/Demo verified. PT eval completed with HHPT recommended, with patient agreeable. CMS HH Compare list provided with [facility] selected. [place] referral given to Adela Lank [facility] liaison); AVS updated. Patient stated her friends will provide assistance and she feels able to manage her own needs post-discharge. No further needs from CM.    Final next level of care: [place] Barriers to Discharge: No Barriers Identified   Patient Goals and CMS Choice Patient states their goals for this hospitalization and ongoing recovery are:: "to be safe at home" CMS Medicare.gov Compare Post Acute Care list provided to:: Patient Choice offered to / list presented to : Patient   Discharge Plan and Services         DME Arranged: N/A DME Agency: NA       HH Arranged: PT HH Agency: [place] Date [place]: 08/05/19 Time [place]: [id number] Representative spoke with at [place]: Adela Lank RN [facility] liaison)  Social Determinants of Health (SDOH) Interventions     Readmission Risk Interventions No flowsheet data found.

## 2019-08-05 NOTE — Progress Notes (Signed)
Physical Therapy Treatment Patient Details Name: [name] MRN: [id number] DOB: 27-Mar-1951 Today's Date: 08/05/2019    History of Present Illness Pt is a 68 year old woman admitted for ORIF of her L ankle pilon fx sustained on 07/03/19 while she was hiking. PMH: DM, HTN, depression, B TKA.    PT Comments    Pt responding well to PT treatment, tolerating increased ambulation distances with use of knee scooter. Pt also progressing to stair negotiation with minA of 2 PTs. Pt will benefit from continued acute PT services to improve stability during ambulation and stair negotiation, as well as tolerance for OOB activity. PT recommendations: Home with Home Health PT and assistance from friends for stair negotiation, no current DME needs.   Follow Up Recommendations  Home health PT     Equipment Recommendations  None recommended by PT(pt declining bedside commode)    Recommendations for Other Services       Precautions / Restrictions Precautions Precautions: Fall Required Braces or Orthoses: Other Brace Restrictions Weight Bearing Restrictions: Yes LLE Weight Bearing: Non weight bearing    Mobility  Bed Mobility Overal bed mobility: Modified Independent                Transfers Overall transfer level: Needs assistance Equipment used: Rolling walker (2 wheeled) Transfers: Sit to/from [facility] Sit to Stand: Supervision Stand pivot transfers: Supervision(with use of armchair, no RW needed)          Ambulation/Gait Ambulation/Gait assistance: Min guard Gait Distance (Feet): 100 Feet Assistive device: (knee scooter) Gait Pattern/deviations: Step-to pattern(with use of knee scooter) Gait velocity: functional   General Gait Details: Pt with increased lateral sway, increasing gait speed at times, PT provides verbal cues and demonstration for use of hand brakes to reduce gait speed when utilizing knee scooter   Stairs Stairs: Yes Stairs  assistance: Min assist Stair Management: No rails;Backwards;With walker Number of Stairs: 3 General stair comments: 2 trials of 3 steps, backwards to ascend stairs and forward to descend. 1 PT providing stabilization of RW, 2nd PT guarding patient. Hop to step pattern   Wheelchair Mobility    Modified Rankin (Stroke Patients Only)       Balance Overall balance assessment: Needs assistance Sitting-balance support: No upper extremity supported;Feet supported Sitting balance-Leahy Scale: Good     Standing balance support: Bilateral upper extremity supported Standing balance-Leahy Scale: Poor                              Cognition Arousal/Alertness: Awake/alert Behavior During Therapy: WFL for tasks assessed/performed Overall Cognitive Status: Within Functional Limits for tasks assessed                                        Exercises      General Comments        Pertinent Vitals/Pain Pain Assessment: Faces Faces Pain Scale: Hurts even more Pain Location: L LE Pain Descriptors / Indicators: Sore;Grimacing;Guarding Pain Intervention(s): Limited activity within patient's tolerance    Home Living                      Prior Function            PT Goals (current goals can now be found in the care plan section) Progress towards PT goals: Progressing toward goals  Frequency    Min 5X/week      PT Plan Current plan remains appropriate    Co-evaluation              AM-PAC PT "6 Clicks" Mobility   Outcome Measure  Help needed turning from your back to your side while in a flat bed without using bedrails?: None Help needed moving from lying on your back to sitting on the side of a flat bed without using bedrails?: None Help needed moving to and from a bed to a chair (including a wheelchair)?: None Help needed standing up from a chair using your arms (e.g., wheelchair or bedside chair)?: None Help needed to walk in  hospital room?: A Little Help needed climbing 3-5 steps with a railing? : A Little 6 Click Score: 22    End of Session Equipment Utilized During Treatment: Gait belt Activity Tolerance: Patient limited by fatigue;Patient limited by pain Patient left: in chair;with call bell/phone within reach   PT Visit Diagnosis: Muscle weakness (generalized) (M62.81);Difficulty in walking, not elsewhere classified (R26.2);Pain Pain - Right/Left: Left Pain - part of body: Leg     Time: 0919-1003 PT Time Calculation (min) (ACUTE ONLY): 44 min  Charges:  $Gait Training: 23-37 mins $Therapeutic Activity: 8-22 mins                    Zenaida Niece, PT, DPT Pager: [phone]  Zenaida Niece 08/05/2019, 12:34 PM

## 2019-08-05 NOTE — Progress Notes (Signed)
In with night shift nurse to receive report. Pt c/o pain 9/10. Medicated with prns as ordered. Pt noted to be jittery, seemed upset, but stated that it was just because of pain. Continue to monitor.

## 2019-08-05 NOTE — Discharge Summary (Signed)
Orthopaedic Trauma Service (OTS) Discharge Summary   Patient ID: [name] MRN: [id number] DOB/AGE: May 07, 1951 68 y.o.  Admit date: 08/03/2019 Discharge date: 08/05/2019  Admission Diagnoses: Closed left extra-articular tibial plafond fracture, subacute Hypertension Diabetes  Discharge Diagnoses:  Principal Problem:   Left tibial fracture Active Problems:   Asthma   Essential hypertension   Type 2 diabetes mellitus without complication (HCC)   Past Medical History:  Diagnosis Date   Depression    Diabetes mellitus without complication (HCC)    GERD (gastroesophageal reflux disease)    Hypertension    Hypothyroidism    Type 1 diabetes ([place])      Procedures Performed: 08/03/2019-Dr. Marcelino Scot  Open reduction internal fixation of a left extraarticular tibial pilon fracture, tibia only.  Second application of wound VAC.  Discharged Condition: good  Hospital Course:   Patient is a 68 year old female well-known to the orthopedic trauma service.  She sustained a closed left distal tibia fracture approximately 4 weeks ago.  She was ultimately referred to our office from an outside orthopedic facility due to the complexity of the injury.  Unfortunately her soft tissue swelling precluded early intervention.  She is followed very closely in the outpatient setting with serial exams to monitor soft tissue swelling.  Ultimately her soft tissue swelling subsided enough to allow for surgical intervention.  Patient was taken to the OR on 08/03/2019 for the procedure noted above.  Patient tolerated procedure well.  She did have a preoperative regional block.  This was effective for pain control.  Patient tolerated surgery very well after surgery she was transferred to the PACU for recovery from anesthesia and then transferred to the orthopedic floor for observation, pain control and therapies.  Patient's hospital stay was uncomplicated.  There were no major issues of note.  We  did wean her from IV pain medication to oral pain medications.  She was started on Lovenox for DVT and PE prophylaxis on postoperative day #1 and was covered with Ancef for perioperative antibiotic coverage.  On postoperative day #2 patient was mobilizing well, her pain was well controlled and tolerating a diet.  She was stable for discharge on postoperative day #2, 08/05/2019.  Prior to discharge her wound VAC was transition from the hospital unit to the portable home unit.  Again this is an incisional VAC.  This will be removed at her follow-up in approximately 10days.  We will remove her prevena dressing as well as a splint and then convert her to a cam boot so she can begin range of motion exercises of her ankle.  She will be nonweightbearing for approximately 6 weeks.  We will likely then allow her to begin some graduated weightbearing in a protected fashion.  Patient will remain on Lovenox for DVT and PE prophylaxis for 2 weeks.  We did stress on numerous occasions the importance of maintaining tight sugar control to minimize her risk of infection and nonunion.  We did check some basic metabolic bone labs including a vitamin D.  Her 25-hydroxy vitamin D levels looked excellent.  She will continue her home supplementation for this.  She also be started on vitamin C to help with wound healing as well.  Consults: Diabetes coordinator  Significant Diagnostic Studies: labs:   Results for KIRSTY, MONJARAZ (MRN [id number]) as of 08/05/2019 15:41  Ref. Range 08/03/2019 06:47  Vit D, 1,25-Dihydroxy Latest Ref Range: 19.9 - 79.3 pg/mL 53.4  Vitamin D, 25-Hydroxy Latest Ref Range: 30.0 - 100.0 ng/mL 49.7  Treatments: IV hydration, antibiotics: Ancef, analgesia: Tylenol, Dilaudid, Norco, anticoagulation: LMW heparin, therapies: PT, OT and RN and surgery: As above  Discharge Exam:                                Orthopedic Trauma Service Progress Note   Patient ID: [name] MRN:  [id number] DOB/AGE: 12-Dec-1950 68 y.o.   Subjective: Doing ok Still some issues with pain control but overall improved    Ready to go home today    Review of Systems  Constitutional: Negative for chills and fever.  Respiratory: Negative for shortness of breath and wheezing.   Cardiovascular: Negative for chest pain and palpitations.  Gastrointestinal: Negative for abdominal pain, nausea and vomiting.      Objective:    VITALS:         Vitals:    08/05/19 0326 08/05/19 0328 08/05/19 0415 08/05/19 0828  BP: (!) 149/77 (!) 148/74   139/64  Pulse: (!) 114 (!) 115 95 (!) 103  Resp:   18   16  Temp: 98.3 F (36.8 C)     98.4 F (36.9 C)  TempSrc: Oral     Oral  SpO2: (!) 89% 90%   94%  Weight:          Height:             Estimated body mass index is 25.75 kg/m as calculated from the following:   Height as of this encounter: 5' 4"  (1.626 m).   Weight as of this encounter: 68 kg.     Intake/Output      09/16 0701 - 09/17 0700 09/17 0701 - 09/18 0700   P.O. 480 240   I.V. (mL/kg)     IV Piggyback     Total Intake(mL/kg) 480 (7.1) 240 (3.5)   Urine (mL/kg/hr)     Drains     Stool     Blood     Total Output     Net +480 +240        Urine Occurrence 1300 x       LABS   Lab Results Last 24 Hours       Results for orders placed or performed during the hospital encounter of 08/03/19 (from the past 24 hour(s))  Glucose, capillary     Status: Abnormal    Collection Time: 08/04/19  4:19 PM  Result Value Ref Range    Glucose-Capillary 171 (H) 70 - 99 mg/dL  Glucose, capillary     Status: Abnormal    Collection Time: 08/04/19  9:32 PM  Result Value Ref Range    Glucose-Capillary 180 (H) 70 - 99 mg/dL  Basic metabolic panel     Status: Abnormal    Collection Time: 08/05/19  4:01 AM  Result Value Ref Range    Sodium 131 (L) 135 - 145 mmol/L    Potassium 5.3 (H) 3.5 - 5.1 mmol/L    Chloride 97 (L) 98 - 111 mmol/L    CO2 25 22 - 32 mmol/L    Glucose, Bld 336 (H) 70  - 99 mg/dL    BUN 12 8 - 23 mg/dL    Creatinine, Ser 0.85 0.44 - 1.00 mg/dL    Calcium 8.6 (L) 8.9 - 10.3 mg/dL    GFR calc non Af Amer >60 >60 mL/min    GFR calc Af Amer >60 >60 mL/min    Anion gap 9 5 -  15  CBC     Status: Abnormal    Collection Time: 08/05/19  4:01 AM  Result Value Ref Range    WBC 8.7 4.0 - 10.5 K/uL    RBC 3.27 (L) 3.87 - 5.11 MIL/uL    Hemoglobin 9.9 (L) 12.0 - 15.0 g/dL    HCT 30.8 (L) 36.0 - 46.0 %    MCV 94.2 80.0 - 100.0 fL    MCH 30.3 26.0 - 34.0 pg    MCHC 32.1 30.0 - 36.0 g/dL    RDW 13.0 11.5 - 15.5 %    Platelets 300 150 - 400 K/uL    nRBC 0.0 0.0 - 0.2 %  Glucose, capillary     Status: Abnormal    Collection Time: 08/05/19  6:48 AM  Result Value Ref Range    Glucose-Capillary 335 (H) 70 - 99 mg/dL  Glucose, capillary     Status: Abnormal    Collection Time: 08/05/19 12:01 PM  Result Value Ref Range    Glucose-Capillary 65 (L) 70 - 99 mg/dL         PHYSICAL EXAM:      Gen: sitting up in chair, NAD, appears well Ext:       Left Lower Extremity              Splint fitting well             Splint c/d/i             Ext warm              + DP pulse             EHL, FHL, lesser toe motor grossly intact             DPN, SPN, TN sensation grossly intact              No pain with passive stretching              Changed over to prevena vac canister      Assessment/Plan: 2 Days Post-Op    Principal Problem:   Left tibial fracture Active Problems:   Asthma   Essential hypertension   Type 2 diabetes mellitus without complication (HCC)               Anti-infectives (From admission, onward)      Start     Dose/Rate Route Frequency Ordered Stop    08/03/19 1400   ceFAZolin (ANCEF) IVPB 1 g/50 mL premix     1 g 100 mL/hr over 30 Minutes Intravenous Every 6 hours 08/03/19 1349 08/04/19 0202    08/03/19 0700   ceFAZolin (ANCEF) IVPB 2g/100 mL premix     2 g 200 mL/hr over 30 Minutes Intravenous On call to O.R. 08/03/19 2297 08/03/19  0834       .   POD/HD#: 2   68 y/o female with subacute extra-articular left pilon fracture    -subacute extra-articular L pilon fracture s/p ORIF             NWB x 8 weeks             Ice and elevate for swelling and pain control                         Elevate ankle above heart as much as possible             Ok to mobilize  with walker or rolling knee scooter             PT/OT             Splint x 2 weeks then transition to CAM              Prevena to remain on until Splint removed   - Pain management:             norco              Robaxin               - ABL anemia/Hemodynamics             Stable              Monitor    - Medical issues              DM                          stable                HTN                         Pressures look good                       resume home meds               - DVT/PE prophylaxis:             lovenox x 14 days   - ID:              perio op abx   - Metabolic Bone Disease:             Labs look good             Continue with home supplementation regimen    - Activity:             NWB L leg   - FEN/GI prophylaxis/Foley/Lines:             Carb mod diet             dc IV and IVF    -Ex-fix/Splint care:             Keep splint clean and dry                     - Impediments to fracture healing:             DM             Subacute fracture   - Dispo:            dc home today              Follow up 08/16/2019     Disposition: Discharge disposition: 01-Home or Self Care       Discharge Instructions    Call MD / Call 911   Complete by: As directed    If you experience chest pain or shortness of breath, CALL 911 and be transported to the hospital emergency room.  If you develope a fever above 101 F, pus (white drainage) or increased drainage or redness at the wound, or calf pain, call your surgeon's office.   Constipation Prevention   Complete by: As directed    Drink  plenty of fluids.  Prune juice may be  helpful.  You may use a stool softener, such as Colace (over the counter) 100 mg twice a day.  Use MiraLax (over the counter) for constipation as needed.   Diet Carb Modified   Complete by: As directed    Discharge instructions   Complete by: As directed    Orthopaedic Trauma Service Discharge Instructions   General Discharge Instructions  WEIGHT BEARING STATUS: Nonweightbearing left leg  RANGE OF MOTION/ACTIVITY: Unrestricted range of motion left knee and toes.  Activity as tolerated while maintaining weightbearing restrictions  Bone health: Vitamin D labs look really good continue to take vitamin D supplementation as you have been doing.  Please also take vitamin C 500 mg daily to help with soft tissue healing  Wound Care: Keep splint clean and dry.  Do not remove splint.  Please make sure that the Prevena wound VAC is charged as much as possible.  Recommend plugging at night.  Okay to disconnect from a power source when moving around.  We will remove the Prevena and splint at your office visit  DVT/PE prophylaxis: Lovenox 40 mg subcutaneous injection daily x 14 days   Diet: as you were eating previously.  Can use over the counter stool softeners and bowel preparations, such as Miralax, to help with bowel movements.  Narcotics can be constipating.  Be sure to drink plenty of fluids  PAIN MEDICATION USE AND EXPECTATIONS  You have likely been given narcotic medications to help control your pain.  After a traumatic event that results in an fracture (broken bone) with or without surgery, it is ok to use narcotic pain medications to help control one's pain.  We understand that everyone responds to pain differently and each individual patient will be evaluated on a regular basis for the continued need for narcotic medications. Ideally, narcotic medication use should last no more than 6-8 weeks (coinciding with fracture healing).   As a patient it is your responsibility as well to monitor narcotic  medication use and report the amount and frequency you use these medications when you come to your office visit.   We would also advise that if you are using narcotic medications, you should take a dose prior to therapy to maximize you participation.  IF YOU ARE ON NARCOTIC MEDICATIONS IT IS NOT PERMISSIBLE TO OPERATE A MOTOR VEHICLE (MOTORCYCLE/CAR/TRUCK/MOPED) OR HEAVY MACHINERY DO NOT MIX NARCOTICS WITH OTHER CNS (CENTRAL NERVOUS SYSTEM) DEPRESSANTS SUCH AS ALCOHOL   STOP SMOKING OR USING NICOTINE PRODUCTS!!!!  As discussed nicotine severely impairs your body's ability to heal surgical and traumatic wounds but also impairs bone healing.  Wounds and bone heal by forming microscopic blood vessels (angiogenesis) and nicotine is a vasoconstrictor (essentially, shrinks blood vessels).  Therefore, if vasoconstriction occurs to these microscopic blood vessels they essentially disappear and are unable to deliver necessary nutrients to the healing tissue.  This is one modifiable factor that you can do to dramatically increase your chances of healing your injury.    (This means no smoking, no nicotine gum, patches, etc)  DO NOT USE NONSTEROIDAL ANTI-INFLAMMATORY DRUGS (NSAID'S)  Using products such as Advil (ibuprofen), Aleve (naproxen), Motrin (ibuprofen) for additional pain control during fracture healing can delay and/or prevent the healing response.  If you would like to take over the counter (OTC) medication, Tylenol (acetaminophen) is ok.  However, some narcotic medications that are given for pain control contain acetaminophen as well. Therefore, you should not exceed more than  4000 mg of tylenol in a day if you do not have liver disease.  Also note that there are may OTC medicines, such as cold medicines and allergy medicines that my contain tylenol as well.  If you have any questions about medications and/or interactions please ask your doctor/PA or your pharmacist.      ICE AND ELEVATE  INJURED/OPERATIVE EXTREMITY  Using ice and elevating the injured extremity above your heart can help with swelling and pain control.  Icing in a pulsatile fashion, such as 20 minutes on and 20 minutes off, can be followed.    Do not place ice directly on skin. Make sure there is a barrier between to skin and the ice pack.    Using frozen items such as frozen peas works well as the conform nicely to the are that needs to be iced.  USE AN ACE WRAP OR TED HOSE FOR SWELLING CONTROL  In addition to icing and elevation, Ace wraps or TED hose are used to help limit and resolve swelling.  It is recommended to use Ace wraps or TED hose until you are informed to stop.    When using Ace Wraps start the wrapping distally (farthest away from the body) and wrap proximally (closer to the body)   Example: If you had surgery on your leg or thing and you do not have a splint on, start the ace wrap at the toes and work your way up to the thigh        If you had surgery on your upper extremity and do not have a splint on, start the ace wrap at your fingers and work your way up to the upper arm  IF YOU ARE IN A SPLINT OR CAST DO NOT [place]   If your splint gets wet for any reason please contact the office immediately. You may shower in your splint or cast as long as you keep it dry.  This can be done by wrapping in a cast cover or garbage back (or similar)  Do Not stick any thing down your splint or cast such as pencils, money, or hangers to try and scratch yourself with.  If you feel itchy take benadryl as prescribed on the bottle for itching  IF YOU ARE IN A CAM BOOT (BLACK BOOT)  You may remove boot periodically. Perform daily dressing changes as noted below.  Wash the liner of the boot regularly and wear a sock when wearing the boot. It is recommended that you sleep in the boot until told otherwise    Call office for the following: Temperature greater than 101F Persistent nausea and  vomiting Severe uncontrolled pain Redness, tenderness, or signs of infection (pain, swelling, redness, odor or green/yellow discharge around the site) Difficulty breathing, headache or visual disturbances Hives Persistent dizziness or light-headedness Extreme fatigue Any other questions or concerns you may have after discharge  In an emergency, call 911 or go to an Emergency Department at a nearby hospital    [place]: [phone]   VISIT OUR WEBSITE FOR ADDITIONAL INFORMATION: [email or web address]   Driving restrictions   Complete by: As directed    No driving   Increase activity slowly as tolerated   Complete by: As directed    Non weight bearing   Complete by: As directed    Laterality: left   Extremity: Lower     Allergies as of 08/05/2019   No Known Allergies  Medication List    STOP taking these medications   ibuprofen 200 MG tablet Commonly known as: ADVIL   oxyCODONE-acetaminophen 5-325 MG tablet Commonly known as: PERCOCET/ROXICET     TAKE these medications   acetaminophen 500 MG tablet Commonly known as: TYLENOL Take 1 tablet (500 mg total) by mouth every 12 (twelve) hours.   amLODipine 10 MG tablet Commonly known as: NORVASC Take 10 mg by mouth daily.   ascorbic acid 500 MG tablet Commonly known as: VITAMIN C Take 1 tablet (500 mg total) by mouth daily. Start taking on: August 06, 2019   aspirin EC 81 MG tablet Take 81 mg by mouth 3 (three) times a week.   dicyclomine 20 MG tablet Commonly known as: BENTYL Take 20 mg by mouth daily.   docusate sodium 100 MG capsule Commonly known as: COLACE Take 1 capsule (100 mg total) by mouth 2 (two) times daily.   enoxaparin 40 MG/0.4ML injection Commonly known as: LOVENOX Inject 0.4 mLs (40 mg total) into the skin daily for 14 days. Start taking on: August 06, 2019   enoxaparin Kit Commonly known as: LOVENOX 1 kit by Does not apply route once for 1  dose.   FISH OIL PO Take 1 capsule by mouth daily.   HYDROcodone-acetaminophen 5-325 MG tablet Commonly known as: Norco Take 1-2 tablets by mouth every 6 (six) hours as needed for moderate pain or severe pain.   insulin aspart 100 UNIT/ML injection Commonly known as: novoLOG Inject 2-3 Units into the skin 3 (three) times daily before meals. Sliding scale   insulin glargine 100 UNIT/ML injection Commonly known as: LANTUS Inject 14 Units into the skin every evening. Reported on 02/02/2016   levothyroxine 125 MCG tablet Commonly known as: SYNTHROID Take 125 mcg by mouth daily before breakfast.   lisinopril-hydrochlorothiazide 10-12.5 MG tablet Commonly known as: ZESTORETIC Take 1 tablet by mouth daily.   LORazepam 0.5 MG tablet Commonly known as: ATIVAN Take 0.5 mg by mouth 2 (two) times daily as needed for anxiety.   methocarbamol 500 MG tablet Commonly known as: ROBAXIN Take 1 tablet (500 mg total) by mouth 4 (four) times daily.   metoCLOPramide 10 MG tablet Commonly known as: Reglan Take 1 tablet (10 mg total) by mouth daily.   omeprazole 40 MG capsule Commonly known as: PRILOSEC Take 1 capsule (40 mg total) by mouth daily.   PROBIOTIC PO Take 1 capsule by mouth daily.   rosuvastatin 10 MG tablet Commonly known as: CRESTOR Take 10 mg by mouth daily.   venlafaxine XR 150 MG 24 hr capsule Commonly known as: EFFEXOR-XR Take 150 mg by mouth daily with breakfast.   Vitamin D (Ergocalciferol) 1.25 MG (50000 UT) Caps capsule Commonly known as: DRISDOL Take 50,000 Units by mouth every Wednesday.            Discharge Care Instructions  (From admission, onward)         Start     Ordered   08/05/19 0000  Non weight bearing    Question Answer Comment  Laterality left   Extremity Lower      08/05/19 1545         Follow-up Information    [facility] [place] Follow up.   Specialty: Home Health Services Why: Home Health Physical Therapy Contact  information: [place] [address] [phone]        Altamese Swink, MD. Schedule an appointment as soon as possible for a visit on  08/16/2019.   Specialty: Orthopedic Surgery Contact information: [place] [address] [phone]           Discharge Instructions and Plan:  Patient is sustained a significant injury to her left distal tibia.  We were able to achieve excellent fixation.  Reduction was difficult given its subacute nature.  Overall alignment is much improved and nearly anatomic.  Would hopeful that the patient will heal uneventfully and have a good functional recovery.  She has at risk for complications given her diabetes.  She understands the importance of maintaining tight sugar control.  She will be nonweightbearing for 6 to 8 weeks with graduated weightbearing thereafter in a protected fashion.  She will remain in her splint for the next 10 days or so.  We will remove her splint and her wound VAC dressing at that time.  Then convert to a cam boot.  Should be covered with Lovenox for DVT PE prophylaxis for the next 14 days.  Daily injection  Patient will contact the office any questions or concerns.  Signed:  [name], PA-C [phone] (C) 08/05/2019, 3:45 PM  [facility] [place] [address] [phone] (F)

## 2019-08-05 NOTE — Plan of Care (Signed)
  Problem: Education: Goal: Knowledge of General Education information will improve Description: Including pain rating scale, medication(s)/side effects and non-pharmacologic comfort measures Outcome: Adequate for Discharge   Problem: Health Behavior/Discharge Planning: Goal: Ability to manage health-related needs will improve Outcome: Adequate for Discharge   Problem: Clinical Measurements: Goal: Ability to maintain clinical measurements within normal limits will improve Outcome: Adequate for Discharge Goal: Will remain free from infection Outcome: Adequate for Discharge Goal: Diagnostic test results will improve Outcome: Adequate for Discharge Goal: Respiratory complications will improve Outcome: Adequate for Discharge Goal: Cardiovascular complication will be avoided Outcome: Adequate for Discharge   Problem: Activity: Goal: Risk for activity intolerance will decrease Outcome: Adequate for Discharge   Problem: Nutrition: Goal: Adequate nutrition will be maintained Outcome: Adequate for Discharge   Problem: Coping: Goal: Level of anxiety will decrease Outcome: Adequate for Discharge   Problem: Elimination: Goal: Will not experience complications related to bowel motility Outcome: Adequate for Discharge Goal: Will not experience complications related to urinary retention Outcome: Adequate for Discharge   Problem: Pain Managment: Goal: General experience of comfort will improve Outcome: Adequate for Discharge   Problem: Safety: Goal: Ability to remain free from injury will improve Outcome: Adequate for Discharge   Problem: Skin Integrity: Goal: Risk for impaired skin integrity will decrease Outcome: Adequate for Discharge   Problem: Acute Rehab PT Goals(only PT should resolve) Goal: Pt Will Go Supine/Side To Sit Outcome: Adequate for Discharge Goal: Pt Will Go Sit To Supine/Side Outcome: Adequate for Discharge Goal: Patient Will Transfer Sit To/From  Stand Outcome: Adequate for Discharge Goal: Pt Will Ambulate Outcome: Adequate for Discharge Goal: Pt Will Go Up/Down Stairs Outcome: Adequate for Discharge

## 2019-08-09 DIAGNOSIS — I10 Essential (primary) hypertension: Secondary | ICD-10-CM | POA: Diagnosis not present

## 2019-08-09 DIAGNOSIS — E039 Hypothyroidism, unspecified: Secondary | ICD-10-CM | POA: Diagnosis not present

## 2019-08-09 DIAGNOSIS — J45909 Unspecified asthma, uncomplicated: Secondary | ICD-10-CM | POA: Diagnosis not present

## 2019-08-09 DIAGNOSIS — Z9181 History of falling: Secondary | ICD-10-CM | POA: Diagnosis not present

## 2019-08-09 DIAGNOSIS — S82872D Displaced pilon fracture of left tibia, subsequent encounter for closed fracture with routine healing: Secondary | ICD-10-CM | POA: Diagnosis not present

## 2019-08-09 DIAGNOSIS — F329 Major depressive disorder, single episode, unspecified: Secondary | ICD-10-CM | POA: Diagnosis not present

## 2019-08-09 DIAGNOSIS — E109 Type 1 diabetes mellitus without complications: Secondary | ICD-10-CM | POA: Diagnosis not present

## 2019-08-09 DIAGNOSIS — Z79891 Long term (current) use of opiate analgesic: Secondary | ICD-10-CM | POA: Diagnosis not present

## 2019-08-09 DIAGNOSIS — Z7901 Long term (current) use of anticoagulants: Secondary | ICD-10-CM | POA: Diagnosis not present

## 2019-08-09 DIAGNOSIS — K219 Gastro-esophageal reflux disease without esophagitis: Secondary | ICD-10-CM | POA: Diagnosis not present

## 2019-08-16 DIAGNOSIS — S82872D Displaced pilon fracture of left tibia, subsequent encounter for closed fracture with routine healing: Secondary | ICD-10-CM | POA: Diagnosis not present

## 2019-08-23 DIAGNOSIS — Z7901 Long term (current) use of anticoagulants: Secondary | ICD-10-CM | POA: Diagnosis not present

## 2019-08-23 DIAGNOSIS — I10 Essential (primary) hypertension: Secondary | ICD-10-CM | POA: Diagnosis not present

## 2019-08-23 DIAGNOSIS — Z79891 Long term (current) use of opiate analgesic: Secondary | ICD-10-CM | POA: Diagnosis not present

## 2019-08-23 DIAGNOSIS — E039 Hypothyroidism, unspecified: Secondary | ICD-10-CM | POA: Diagnosis not present

## 2019-08-23 DIAGNOSIS — F329 Major depressive disorder, single episode, unspecified: Secondary | ICD-10-CM | POA: Diagnosis not present

## 2019-08-23 DIAGNOSIS — J45909 Unspecified asthma, uncomplicated: Secondary | ICD-10-CM | POA: Diagnosis not present

## 2019-08-23 DIAGNOSIS — E109 Type 1 diabetes mellitus without complications: Secondary | ICD-10-CM | POA: Diagnosis not present

## 2019-08-23 DIAGNOSIS — S82872D Displaced pilon fracture of left tibia, subsequent encounter for closed fracture with routine healing: Secondary | ICD-10-CM | POA: Diagnosis not present

## 2019-08-23 DIAGNOSIS — K219 Gastro-esophageal reflux disease without esophagitis: Secondary | ICD-10-CM | POA: Diagnosis not present

## 2019-08-23 DIAGNOSIS — Z9181 History of falling: Secondary | ICD-10-CM | POA: Diagnosis not present

## 2019-09-07 DIAGNOSIS — K219 Gastro-esophageal reflux disease without esophagitis: Secondary | ICD-10-CM | POA: Diagnosis not present

## 2019-09-07 DIAGNOSIS — E109 Type 1 diabetes mellitus without complications: Secondary | ICD-10-CM | POA: Diagnosis not present

## 2019-09-07 DIAGNOSIS — S82872D Displaced pilon fracture of left tibia, subsequent encounter for closed fracture with routine healing: Secondary | ICD-10-CM | POA: Diagnosis not present

## 2019-09-07 DIAGNOSIS — Z79891 Long term (current) use of opiate analgesic: Secondary | ICD-10-CM | POA: Diagnosis not present

## 2019-09-07 DIAGNOSIS — I10 Essential (primary) hypertension: Secondary | ICD-10-CM | POA: Diagnosis not present

## 2019-09-07 DIAGNOSIS — J45909 Unspecified asthma, uncomplicated: Secondary | ICD-10-CM | POA: Diagnosis not present

## 2019-09-07 DIAGNOSIS — E039 Hypothyroidism, unspecified: Secondary | ICD-10-CM | POA: Diagnosis not present

## 2019-09-07 DIAGNOSIS — Z9181 History of falling: Secondary | ICD-10-CM | POA: Diagnosis not present

## 2019-09-07 DIAGNOSIS — F329 Major depressive disorder, single episode, unspecified: Secondary | ICD-10-CM | POA: Diagnosis not present

## 2019-09-07 DIAGNOSIS — Z7901 Long term (current) use of anticoagulants: Secondary | ICD-10-CM | POA: Diagnosis not present

## 2019-09-08 DIAGNOSIS — S82872D Displaced pilon fracture of left tibia, subsequent encounter for closed fracture with routine healing: Secondary | ICD-10-CM | POA: Diagnosis not present

## 2019-09-13 DIAGNOSIS — Z23 Encounter for immunization: Secondary | ICD-10-CM | POA: Diagnosis not present

## 2019-09-14 DIAGNOSIS — S82392A Other fracture of lower end of left tibia, initial encounter for closed fracture: Secondary | ICD-10-CM | POA: Diagnosis not present

## 2019-09-14 DIAGNOSIS — E1043 Type 1 diabetes mellitus with diabetic autonomic (poly)neuropathy: Secondary | ICD-10-CM | POA: Diagnosis not present

## 2019-09-14 DIAGNOSIS — M85859 Other specified disorders of bone density and structure, unspecified thigh: Secondary | ICD-10-CM | POA: Diagnosis not present

## 2019-09-14 DIAGNOSIS — E1065 Type 1 diabetes mellitus with hyperglycemia: Secondary | ICD-10-CM | POA: Diagnosis not present

## 2019-09-14 DIAGNOSIS — E038 Other specified hypothyroidism: Secondary | ICD-10-CM | POA: Diagnosis not present

## 2019-09-14 DIAGNOSIS — I10 Essential (primary) hypertension: Secondary | ICD-10-CM | POA: Diagnosis not present

## 2019-09-14 DIAGNOSIS — E782 Mixed hyperlipidemia: Secondary | ICD-10-CM | POA: Diagnosis not present

## 2019-09-14 DIAGNOSIS — Z1231 Encounter for screening mammogram for malignant neoplasm of breast: Secondary | ICD-10-CM | POA: Diagnosis not present

## 2019-09-22 DIAGNOSIS — S82872D Displaced pilon fracture of left tibia, subsequent encounter for closed fracture with routine healing: Secondary | ICD-10-CM | POA: Diagnosis not present

## 2019-09-28 DIAGNOSIS — M25572 Pain in left ankle and joints of left foot: Secondary | ICD-10-CM | POA: Diagnosis not present

## 2019-09-28 DIAGNOSIS — M25672 Stiffness of left ankle, not elsewhere classified: Secondary | ICD-10-CM | POA: Diagnosis not present

## 2019-09-30 DIAGNOSIS — M25672 Stiffness of left ankle, not elsewhere classified: Secondary | ICD-10-CM | POA: Diagnosis not present

## 2019-09-30 DIAGNOSIS — M25572 Pain in left ankle and joints of left foot: Secondary | ICD-10-CM | POA: Diagnosis not present

## 2019-10-05 DIAGNOSIS — M25572 Pain in left ankle and joints of left foot: Secondary | ICD-10-CM | POA: Diagnosis not present

## 2019-10-05 DIAGNOSIS — M25672 Stiffness of left ankle, not elsewhere classified: Secondary | ICD-10-CM | POA: Diagnosis not present

## 2019-10-06 DIAGNOSIS — Z1231 Encounter for screening mammogram for malignant neoplasm of breast: Secondary | ICD-10-CM | POA: Diagnosis not present

## 2019-10-06 DIAGNOSIS — M85851 Other specified disorders of bone density and structure, right thigh: Secondary | ICD-10-CM | POA: Diagnosis not present

## 2019-10-06 DIAGNOSIS — M8008XA Age-related osteoporosis with current pathological fracture, vertebra(e), initial encounter for fracture: Secondary | ICD-10-CM | POA: Diagnosis not present

## 2019-10-07 DIAGNOSIS — M25672 Stiffness of left ankle, not elsewhere classified: Secondary | ICD-10-CM | POA: Diagnosis not present

## 2019-10-07 DIAGNOSIS — M25572 Pain in left ankle and joints of left foot: Secondary | ICD-10-CM | POA: Diagnosis not present

## 2019-11-29 ENCOUNTER — Encounter: Payer: Self-pay | Admitting: Neurology

## 2019-12-10 ENCOUNTER — Telehealth: Payer: Self-pay

## 2019-12-10 ENCOUNTER — Encounter: Payer: Self-pay | Admitting: Neurology

## 2019-12-10 ENCOUNTER — Other Ambulatory Visit: Payer: Self-pay

## 2019-12-10 ENCOUNTER — Telehealth: Payer: PPO | Admitting: Neurology

## 2019-12-10 NOTE — Telephone Encounter (Signed)
Pt was called Four times to go over medication and at 4 pm to start her appointment with no answer pt voice mail was full and was unable to leave a message. A letter will be mailed to Pt to reschedule her appointment with Dr.Aquino.

## 2019-12-16 DIAGNOSIS — E1065 Type 1 diabetes mellitus with hyperglycemia: Secondary | ICD-10-CM | POA: Diagnosis not present

## 2019-12-16 DIAGNOSIS — E039 Hypothyroidism, unspecified: Secondary | ICD-10-CM | POA: Diagnosis not present

## 2019-12-23 DIAGNOSIS — R519 Headache, unspecified: Secondary | ICD-10-CM | POA: Diagnosis not present

## 2019-12-23 DIAGNOSIS — R1110 Vomiting, unspecified: Secondary | ICD-10-CM | POA: Diagnosis not present

## 2019-12-23 DIAGNOSIS — Z20828 Contact with and (suspected) exposure to other viral communicable diseases: Secondary | ICD-10-CM | POA: Diagnosis not present

## 2019-12-31 DIAGNOSIS — E10319 Type 1 diabetes mellitus with unspecified diabetic retinopathy without macular edema: Secondary | ICD-10-CM | POA: Diagnosis not present

## 2020-01-10 ENCOUNTER — Telehealth: Payer: Self-pay

## 2020-01-10 ENCOUNTER — Other Ambulatory Visit: Payer: Self-pay | Admitting: Family Medicine

## 2020-01-10 MED ORDER — TRAZODONE HCL 50 MG PO TABS: 50.0000 mg | ORAL_TABLET | Freq: Every evening | ORAL | 3 refills | Status: DC | PRN

## 2020-01-10 NOTE — Progress Notes (Signed)
Trazodone sent. Stop lunesta. Kc

## 2020-01-10 NOTE — Telephone Encounter (Signed)
[name] called to report that she is having problems sleeping.  She has been taking the Lunesta with very poor results.  She wanted to see if Dr. Tobie Poet would be willing to try her on Trazodone.  She would like the RX to be sent to [facility].

## 2020-01-17 IMAGING — RF DG ANKLE COMPLETE 3+V*L*
1 series · 6 of 6 positions shown · non-contrast
Comparison: None.

CLINICAL DATA: ORIF distal tibial fracture.

EXAM:
LEFT ANKLE COMPLETE - 3+ VIEW; DG C-ARM 1-60 MIN

[Series 1: run · 6 of 6 slices shown]
[im 1/6]
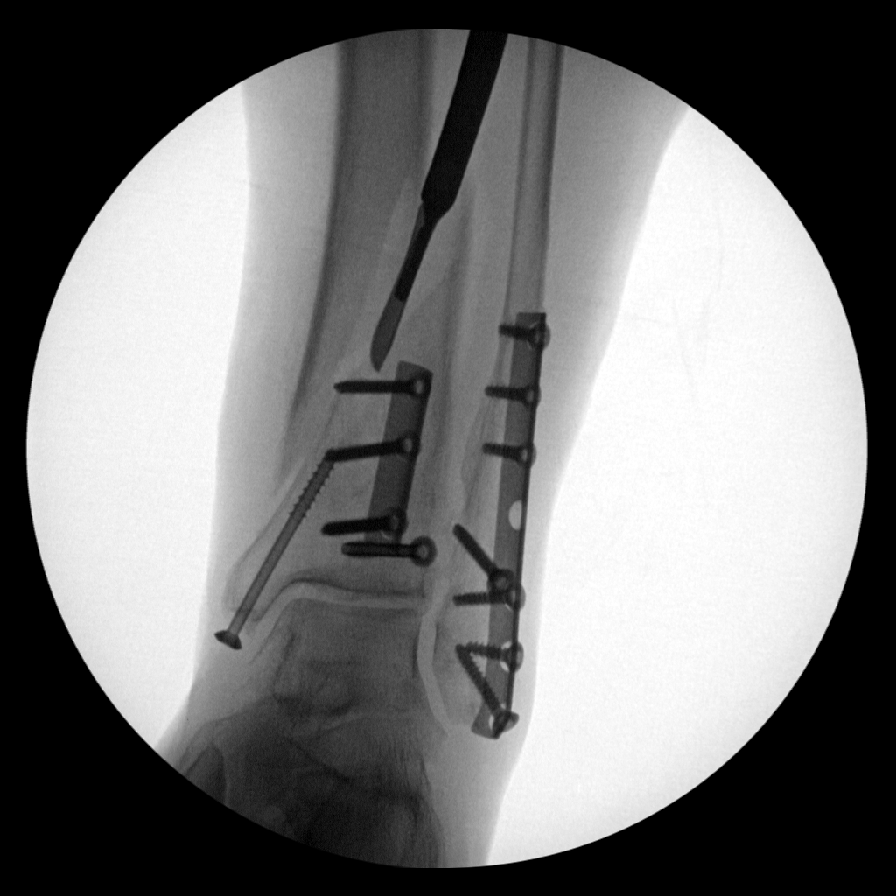
[im 2/6]
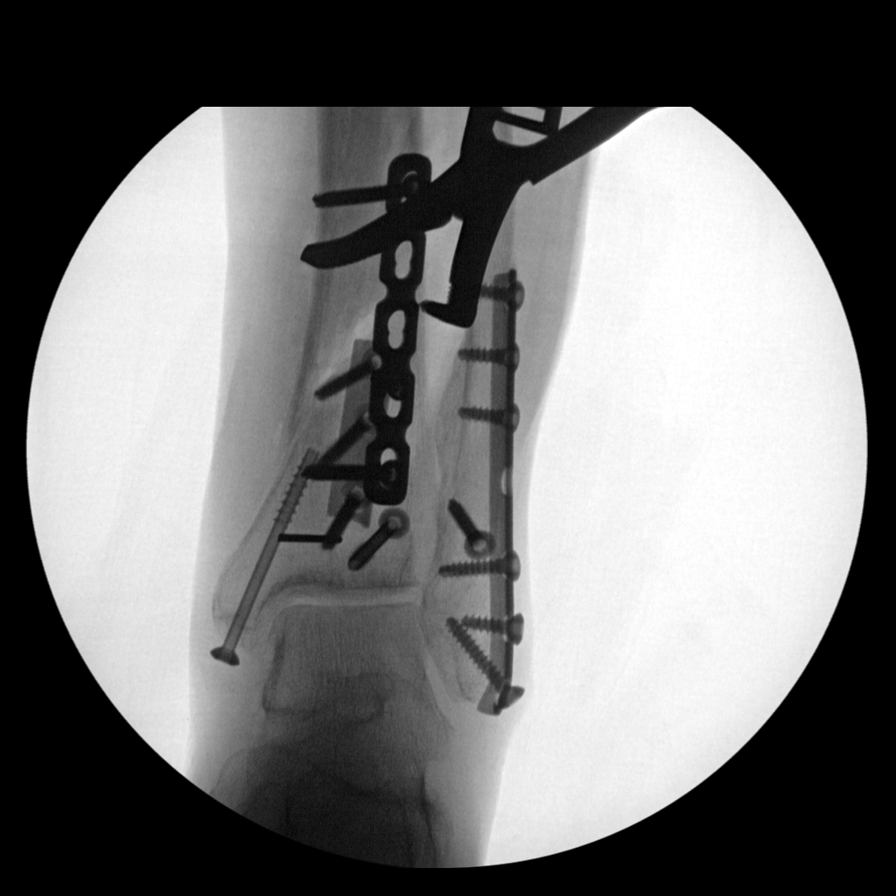
[im 3/6]
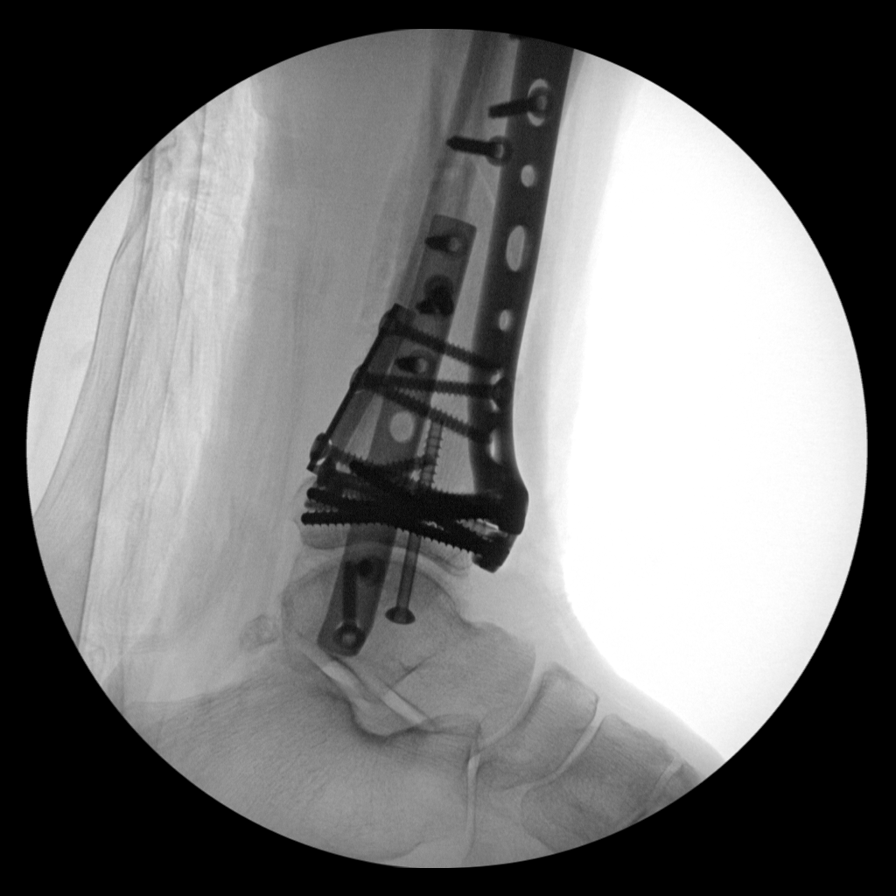
[im 4/6]
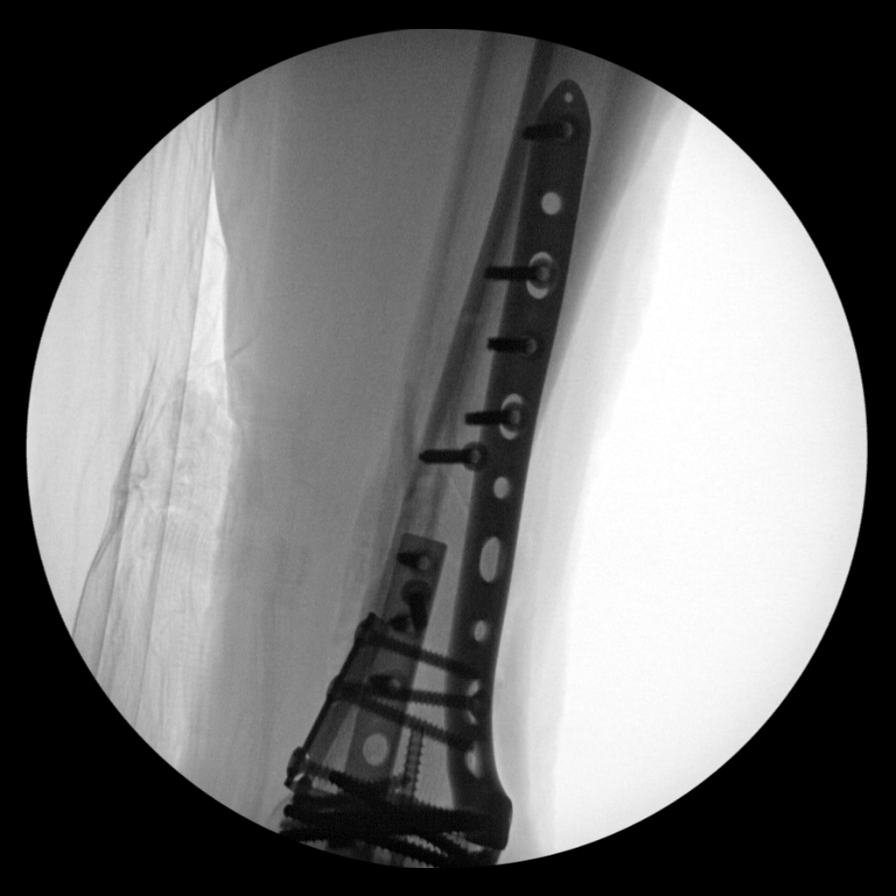
[im 5/6]
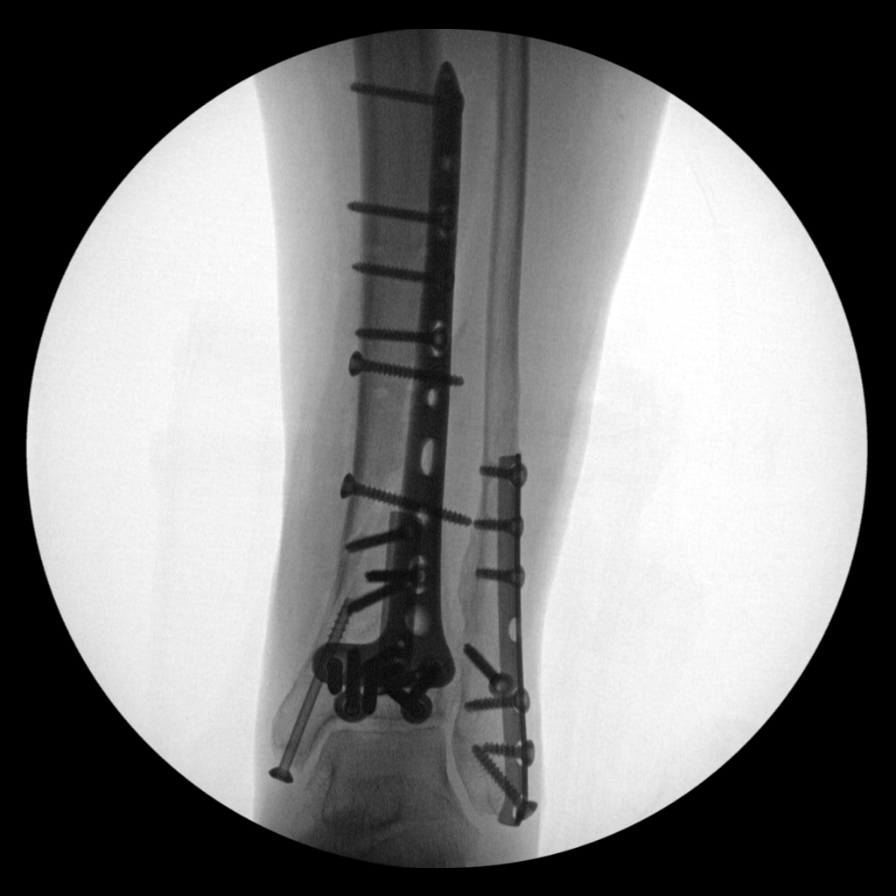
[im 6/6]
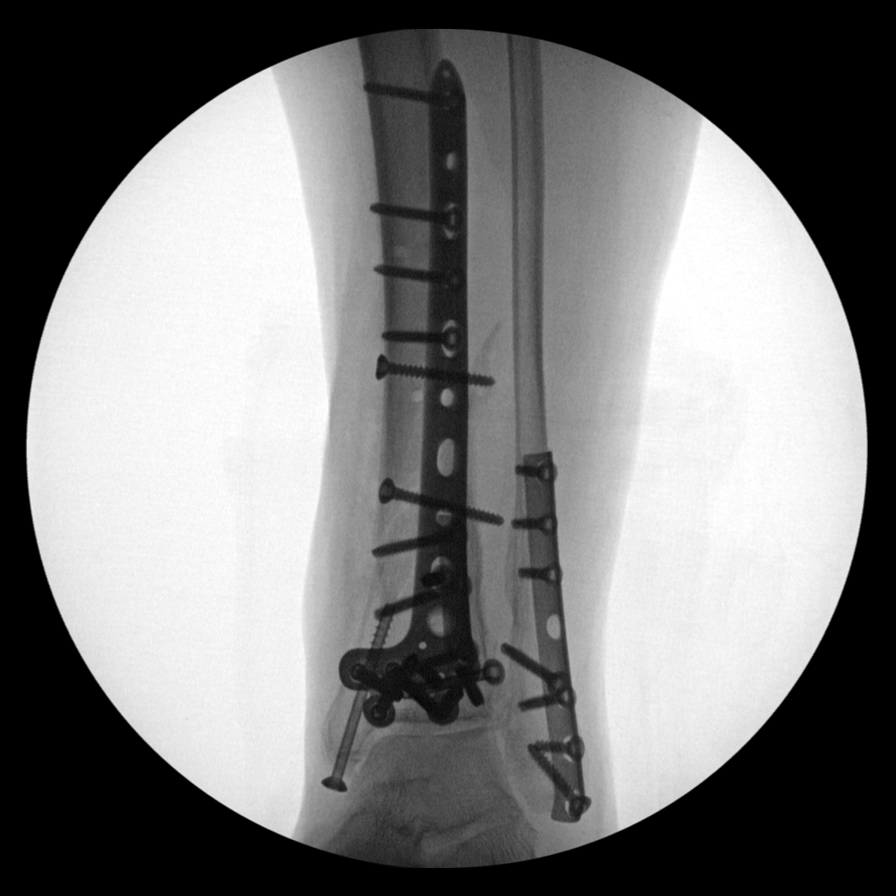

[6 of 6 positions shown; findings below may reference images not displayed]

FINDINGS: Intraoperative spot views of the LEFT ankle are submitted
postoperatively for interpretation.

Existing internal plate and screw fixation of the distal tibia and
fibula noted.

Interval placement of a internal plate and screw fixation traversing
an oblique fracture of the distal tibia is noted. The distal tibial
fracture appears in near-anatomic alignment and position. No gross
complicating features noted.
IMPRESSION: ORIF distal tibial fracture, which appears in near-anatomic
alignment and position.

## 2020-01-17 IMAGING — RF DG C-ARM 1-60 MIN
1 series · 6 of 6 positions shown · non-contrast
Comparison: None.

CLINICAL DATA: ORIF distal tibial fracture.

EXAM:
LEFT ANKLE COMPLETE - 3+ VIEW; DG C-ARM 1-60 MIN

[Series 1: run · 6 of 6 slices shown]
[im 1/6]
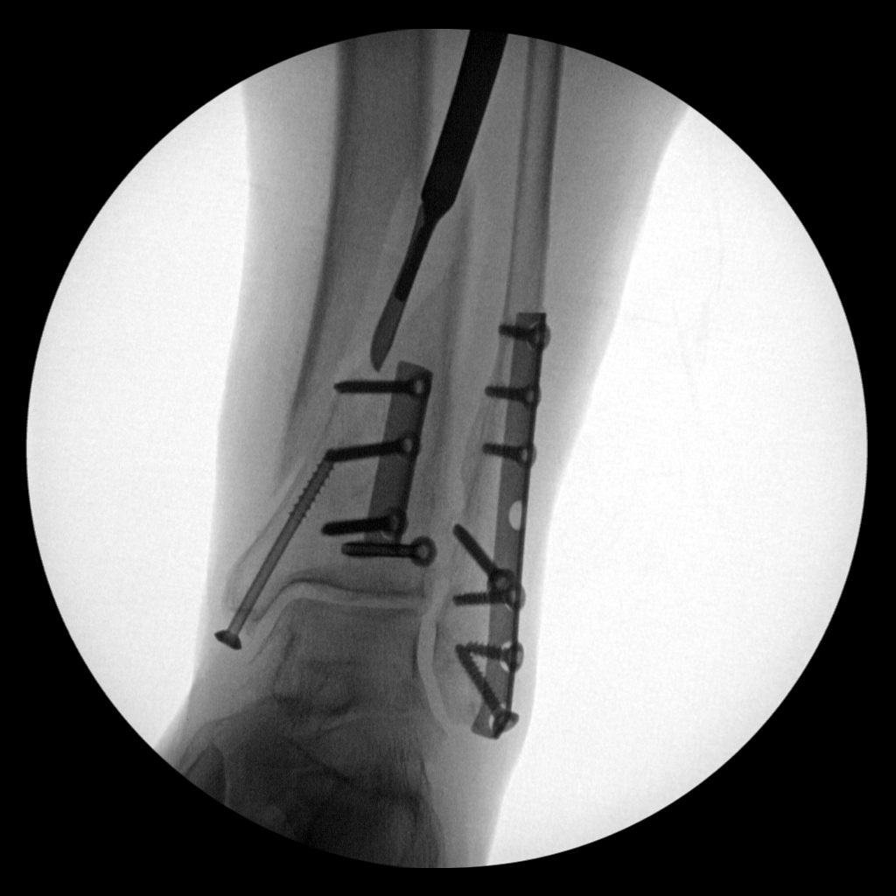
[im 2/6]
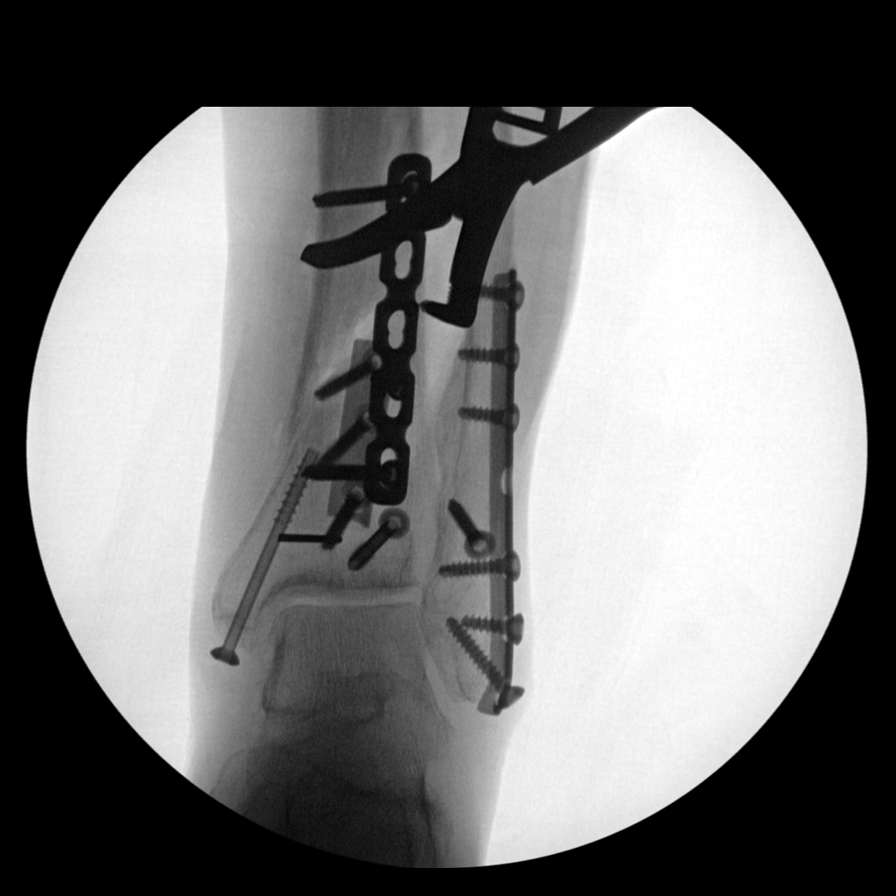
[im 3/6]
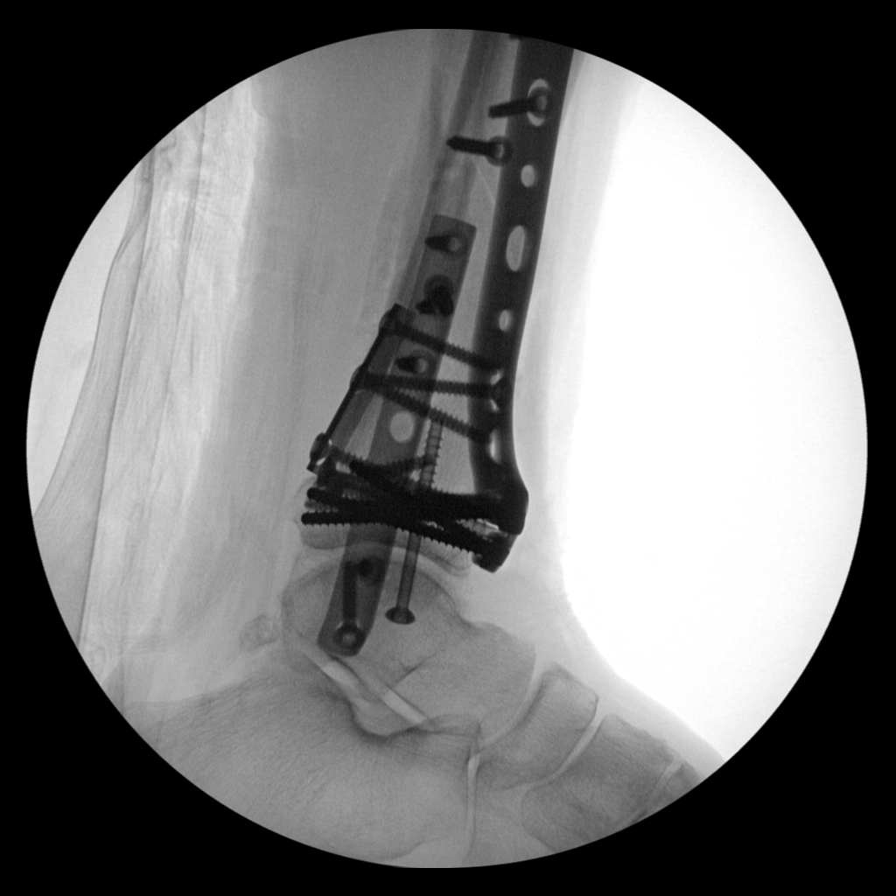
[im 4/6]
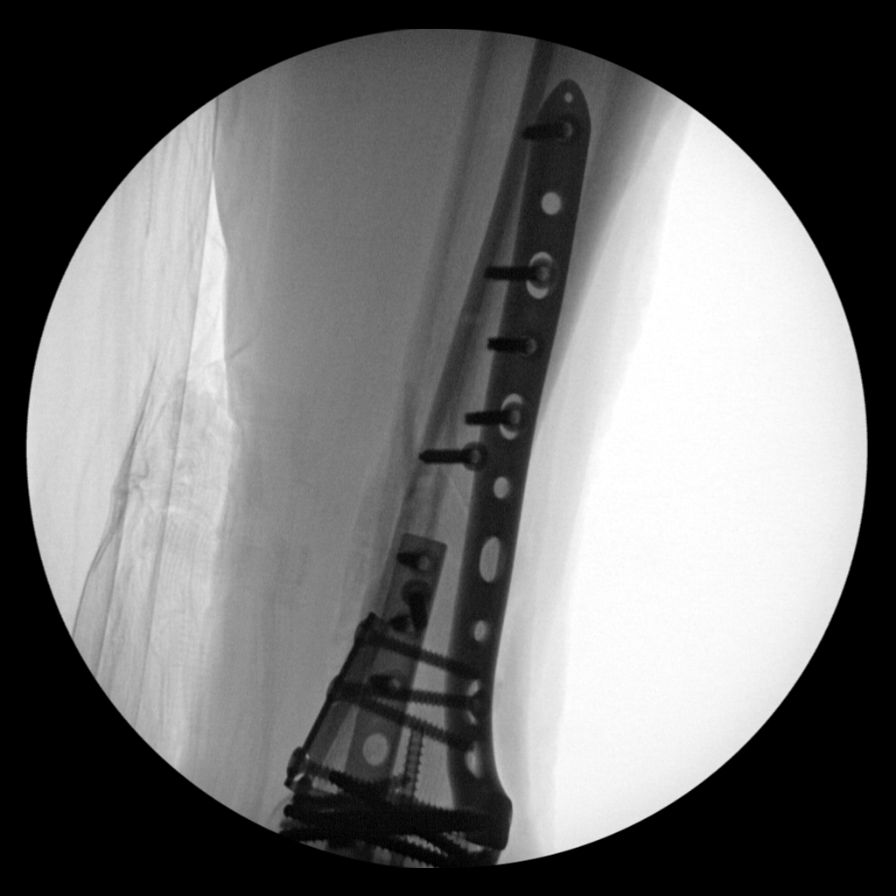
[im 5/6]
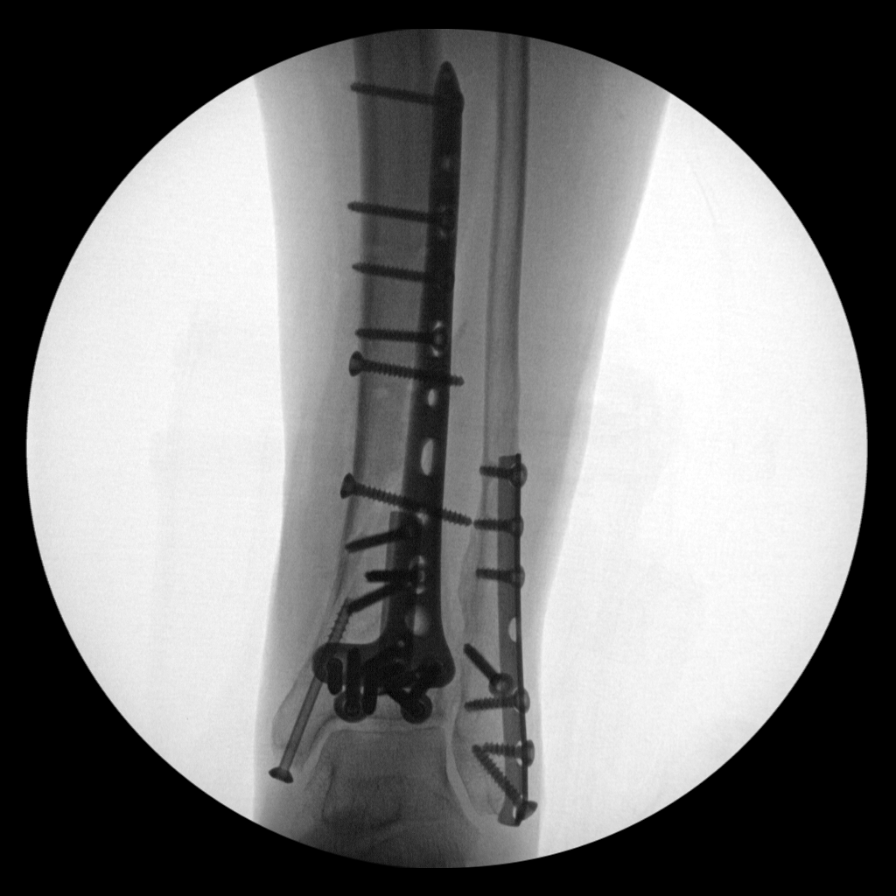
[im 6/6]
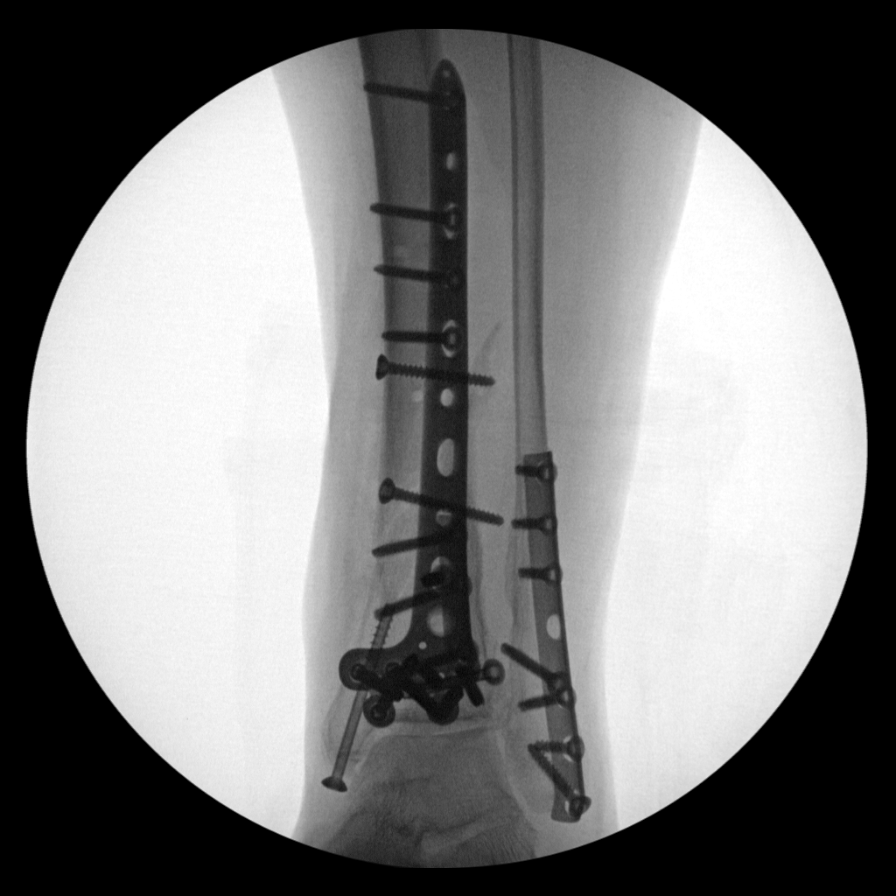

[6 of 6 positions shown; findings below may reference images not displayed]

FINDINGS: Intraoperative spot views of the LEFT ankle are submitted
postoperatively for interpretation.

Existing internal plate and screw fixation of the distal tibia and
fibula noted.

Interval placement of a internal plate and screw fixation traversing
an oblique fracture of the distal tibia is noted. The distal tibial
fracture appears in near-anatomic alignment and position. No gross
complicating features noted.
IMPRESSION: ORIF distal tibial fracture, which appears in near-anatomic
alignment and position.

## 2020-01-18 ENCOUNTER — Other Ambulatory Visit: Payer: Self-pay

## 2020-01-18 ENCOUNTER — Encounter: Payer: Self-pay | Admitting: Neurology

## 2020-01-18 ENCOUNTER — Telehealth (INDEPENDENT_AMBULATORY_CARE_PROVIDER_SITE_OTHER): Payer: PPO | Admitting: Neurology

## 2020-01-18 VITALS — Ht 63.0 in | Wt 140.0 lb

## 2020-01-18 DIAGNOSIS — R413 Other amnesia: Secondary | ICD-10-CM | POA: Diagnosis not present

## 2020-01-18 NOTE — Progress Notes (Signed)
Virtual Visit via Video Note The purpose of this virtual visit is to provide medical care while limiting exposure to the novel coronavirus.    Consent was obtained for video visit:  Yes.   Answered questions that patient had about telehealth interaction:  Yes.   I discussed the limitations, risks, security and privacy concerns of performing an evaluation and management service by telemedicine. I also discussed with the patient that there may be a patient responsible charge related to this service. The patient expressed understanding and agreed to proceed.  Pt location: Home Physician Location: office Name of referring provider:  Cox, Elnita Maxwell, MD I connected with [name] at patients initiation/request on 01/18/2020 at  4:00 PM EST by video enabled telemedicine application and verified that I am speaking with the correct person using two identifiers. Pt MRN:  [id number] Pt DOB:  25-Aug-1951 Video Participants:  [name]   History of Present Illness:  The patient was seen as a virtual video visit on 01/18/2020. She was last seen in the neurology clinic 10 months ago for memory loss. MOCA blind (done over phone) in May 2020 was 20/22. Since her last visit, she has noticed more word-finding difficulties, she would be watching Jeopardy and knows the answers but cannot bring the word up, it is hard for the answer to form. She lives alone and denies getting lost driving, no missed bills or medications. She denies misplacing things or leaving the stove on. She continues to have difficulties lowering her glucose levels, HbA1c 8.1 recently. She denies any headaches, dizziness, focal numbness/tingling/weakness. She fell while hiking last July and fractured her leg, requiring surgery. She has recovered well. She recently started Trazodone for insomnia, which so far has been helping.    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 She started noticing changes last school year, she was told by co-workers that she was asking the same questions. Her son has anxiety and has been concerned because she comes down to the kitchen and does not see him to her side. He thinks there is something wrong. She has occasional word-finding difficulties. She denies getting lost driving, no missed bills or medications. She denies any difficulties with ADLs. She drinks 1-2 glasses of wine every night for the past 8-10 years. She denies any head injuries. She reports they were told her father has dementia, but they are unsure if true. Her sister had strokes with some cognitive changes. She has a history of anxiety and depression, reporting rare anxiety. She denies any worsening stress, but that she had to learn a lot of new things for the new school year. She denies any headaches, dizziness, diplopia, dysarthria, dysphagia, neck/back pain, focal numbness/tingling/weakness, bowel/bladder dysfunction. No anosmia, tremors, no falls.   TSH and B12 done at PCP office were normal. She had an MRI brain with and without contrast done 04/05/16 which was reported as unremarkable, mild chronic small vessel disease and atrophy, no acute changes. Images unavailable for review.    Current Outpatient Medications on File Prior to Visit  Medication Sig Dispense Refill  . amLODipine (NORVASC) 10 MG tablet Take 10 mg by mouth daily.    [name] aspirin EC 81 MG tablet Take 81 mg by mouth 3 (three) times a week.     . dicyclomine (BENTYL) 20 MG tablet Take 20 mg by mouth daily.     [name] docusate  sodium (COLACE) 100 MG capsule Take 1 capsule (100 mg total) by mouth 2 (two) times daily. 30 capsule 1  . HYDROcodone-acetaminophen (NORCO) 5-325 MG tablet Take 1-2 tablets by mouth every 6 (six) hours as needed for moderate pain or severe pain. 50 tablet 0  . insulin aspart (NOVOLOG) 100 UNIT/ML injection Inject 2-3  Units into the skin 3 (three) times daily before meals. Sliding scale    . insulin glargine (LANTUS) 100 UNIT/ML injection Inject 14 Units into the skin every evening. Reported on 02/02/2016    . levothyroxine (SYNTHROID, LEVOTHROID) 125 MCG tablet Take 125 mcg by mouth daily before breakfast.    . lisinopril-hydrochlorothiazide (PRINZIDE,ZESTORETIC) 10-12.5 MG tablet Take 1 tablet by mouth daily.    [name] LORazepam (ATIVAN) 0.5 MG tablet Take 0.5 mg by mouth 2 (two) times daily as needed for anxiety.    . methocarbamol (ROBAXIN) 500 MG tablet Take 1 tablet (500 mg total) by mouth 4 (four) times daily. 60 tablet 1  . metoCLOPramide (REGLAN) 10 MG tablet Take 1 tablet (10 mg total) by mouth daily. 30 tablet 0  . Omega-3 Fatty Acids (FISH OIL PO) Take 1 capsule by mouth daily.    [name] omeprazole (PRILOSEC) 40 MG capsule Take 1 capsule (40 mg total) by mouth daily. 30 capsule 5  . Probiotic Product (PROBIOTIC PO) Take 1 capsule by mouth daily.     . rosuvastatin (CRESTOR) 10 MG tablet Take 10 mg by mouth daily.    . traZODone (DESYREL) 50 MG tablet Take 1 tablet (50 mg total) by mouth at bedtime as needed for up to 30 doses for sleep. 30 tablet 3  . venlafaxine XR (EFFEXOR-XR) 150 MG 24 hr capsule Take 150 mg by mouth daily with breakfast.    . vitamin C (VITAMIN C) 500 MG tablet Take 1 tablet (500 mg total) by mouth daily. 30 tablet 1  . Vitamin D, Ergocalciferol, (DRISDOL) 50000 units CAPS capsule Take 50,000 Units by mouth every Wednesday.     . enoxaparin (LOVENOX) 40 MG/0.4ML injection Inject 0.4 mLs (40 mg total) into the skin daily for 14 days. 5.6 mL 0   No current facility-administered medications on file prior to visit.     Observations/Objective:   Vitals:   01/18/20 0843  Weight: 140 lb (63.5 kg)  Height: 5\' 3"  (1.6 m)   GEN:  The patient appears stated age and is in NAD.  Neurological examination: Patient is awake, alert, oriented x 3. No aphasia or dysarthria. Intact fluency and  comprehension. Remote and recent memory intact. SLUMS socre 19/30  [employer] Mental Exam 01/18/2020  Weekday Correct 1  Current year 1  What state are we in? 1  Amount spent 1  Amount left 0  # of Animals 3  5 objects recall 4  Number series 0  Hour markers 0  Time correct 0  Placed X in triangle correctly 1  Largest Figure 1  Name of female 2  Date back to work 2  Type of work 2  State she lived in 0  Total score 19   Cranial nerves: Extraocular movements intact with no nystagmus. No facial asymmetry. Motor: moves all extremities symmetrically, at least anti-gravity x 4.    Assessment and Plan:   This is a 69 yo RH woman with a history of hypertension, hyperlipidemia, diabetes, who presented in 2017 for worsening memory. She continues to deny any difficulties with complex tasks, she has mostly noticed word-finding difficulties. SLUMS score today  19/30, she was able to recall 4/5 words. We discussed Neurocognitive testing to further evaluate memory concerns, she would like to hold off for now and re-evaluate in 6 months. We again discussed memory changes that can occur with hyperglycemia, continue working with Endocrinology. Follow-up in 6 months, she knows to call for any changes.    Follow Up Instructions:   -I discussed the assessment and treatment plan with the patient. The patient was provided an opportunity to ask questions and all were answered. The patient agreed with the plan and demonstrated an understanding of the instructions.   The patient was advised to call back or seek an in-person evaluation if the symptoms worsen or if the condition fails to improve as anticipated.   Cameron Sprang, MD

## 2020-01-21 ENCOUNTER — Other Ambulatory Visit: Payer: Self-pay | Admitting: Family Medicine

## 2020-01-24 ENCOUNTER — Other Ambulatory Visit: Payer: Self-pay | Admitting: Family Medicine

## 2020-01-24 ENCOUNTER — Telehealth: Payer: Self-pay

## 2020-01-24 DIAGNOSIS — F5101 Primary insomnia: Secondary | ICD-10-CM

## 2020-01-24 NOTE — Telephone Encounter (Signed)
Sending referral to Dr. Maxie Barb office for sleep issues. Kc

## 2020-01-24 NOTE — Progress Notes (Signed)
Patient has failed on variety of sleep medicines. Refer to pulmonology. [place]

## 2020-01-24 NOTE — Telephone Encounter (Signed)
[name] called to report that the Trazodone is not working well for sleep.  She wants a referral to Dr. Alcide Clever for the sleep clinic.

## 2020-01-26 DIAGNOSIS — Z20828 Contact with and (suspected) exposure to other viral communicable diseases: Secondary | ICD-10-CM | POA: Diagnosis not present

## 2020-01-26 DIAGNOSIS — R070 Pain in throat: Secondary | ICD-10-CM | POA: Diagnosis not present

## 2020-02-01 ENCOUNTER — Other Ambulatory Visit: Payer: Self-pay | Admitting: Family Medicine

## 2020-02-01 ENCOUNTER — Telehealth: Payer: Self-pay

## 2020-02-01 MED ORDER — TRAZODONE HCL 100 MG PO TABS: 100.0000 mg | ORAL_TABLET | Freq: Every evening | ORAL | 0 refills | Status: DC | PRN

## 2020-02-01 NOTE — Telephone Encounter (Signed)
[name] called requesting a refill on her trazodone and an increase in the amount.  She has been taking 2 of the 50 mg at bedtime with good results. She has an upcoming appointment at the sleep clinic.

## 2020-02-01 NOTE — Telephone Encounter (Signed)
Dr. Tobie Poet increased to 100 mg 1 at bedtime (listed incorrectly below).

## 2020-02-01 NOTE — Telephone Encounter (Signed)
Increase trazodone 100 mg 2 daily at night.

## 2020-02-02 ENCOUNTER — Other Ambulatory Visit: Payer: Self-pay | Admitting: Family Medicine

## 2020-02-19 ENCOUNTER — Other Ambulatory Visit: Payer: Self-pay | Admitting: Family Medicine

## 2020-02-20 ENCOUNTER — Other Ambulatory Visit: Payer: Self-pay | Admitting: Family Medicine

## 2020-02-29 DIAGNOSIS — G4700 Insomnia, unspecified: Secondary | ICD-10-CM | POA: Diagnosis not present

## 2020-03-01 DIAGNOSIS — H527 Unspecified disorder of refraction: Secondary | ICD-10-CM | POA: Diagnosis not present

## 2020-03-01 DIAGNOSIS — E119 Type 2 diabetes mellitus without complications: Secondary | ICD-10-CM | POA: Diagnosis not present

## 2020-03-01 DIAGNOSIS — H4323 Crystalline deposits in vitreous body, bilateral: Secondary | ICD-10-CM | POA: Diagnosis not present

## 2020-03-01 DIAGNOSIS — Z961 Presence of intraocular lens: Secondary | ICD-10-CM | POA: Diagnosis not present

## 2020-03-16 DIAGNOSIS — H4322 Crystalline deposits in vitreous body, left eye: Secondary | ICD-10-CM | POA: Diagnosis not present

## 2020-04-25 ENCOUNTER — Other Ambulatory Visit: Payer: Self-pay | Admitting: Family Medicine

## 2020-05-13 ENCOUNTER — Other Ambulatory Visit: Payer: Self-pay | Admitting: Family Medicine

## 2020-05-15 ENCOUNTER — Other Ambulatory Visit: Payer: Self-pay | Admitting: Family Medicine

## 2020-07-03 ENCOUNTER — Other Ambulatory Visit: Payer: Self-pay

## 2020-07-03 ENCOUNTER — Ambulatory Visit (INDEPENDENT_AMBULATORY_CARE_PROVIDER_SITE_OTHER): Payer: PPO | Admitting: Family Medicine

## 2020-07-03 ENCOUNTER — Encounter: Payer: Self-pay | Admitting: Family Medicine

## 2020-07-03 VITALS — BP 120/56 | HR 92 | Temp 96.1°F | Resp 16 | Ht 62.6 in | Wt 148.8 lb

## 2020-07-03 DIAGNOSIS — Z23 Encounter for immunization: Secondary | ICD-10-CM | POA: Diagnosis not present

## 2020-07-03 DIAGNOSIS — Z0000 Encounter for general adult medical examination without abnormal findings: Secondary | ICD-10-CM | POA: Diagnosis not present

## 2020-07-03 MED ORDER — HEPATITIS B VAC RECOMBINANT 5 MCG/0.5ML IJ SUSP: 1.0000 mL | Freq: Once | INTRAMUSCULAR | 0 refills | Status: DC

## 2020-07-03 MED ORDER — SHINGRIX 50 MCG/0.5ML IM SUSR: 0.5000 mL | Freq: Once | INTRAMUSCULAR | 0 refills | Status: AC

## 2020-07-03 MED ORDER — HEPATITIS B VAC RECOMBINANT 10 MCG/ML IJ SUSP: 1.0000 mL | Freq: Once | INTRAMUSCULAR | 0 refills | Status: AC

## 2020-07-03 NOTE — Progress Notes (Signed)
Established Patient Office Visit  Subjective:  Patient ID: [name], female    DOB: 05-Feb-1951  Age: 69 y.o. MRN: [id number]  CC:  Chief Complaint  Patient presents with  . 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 Pt states she was given vaccines in 2016 and 2017 when she worked in [place]. Pt states she has not completed her Hep B series-given #1 and #2 but did not receive #3. Pt given 2016 Prevnar 16 and 2020 Prevnar 16 but no Pneumo23 on [place] or office notes/site.  Pt with no h/o TB-she-needs test for work.  Pt has not completed shingles vaccines, pt states she has yearly influenza shot.  .  Past Medical History:  Diagnosis Date  . Depression   . Diabetes mellitus without complication ([place])   . GERD (gastroesophageal reflux disease)   . Hypertension   . Hypothyroidism   . Type 1 diabetes [facility])     Past Surgical History:  Procedure Laterality Date  . APPENDECTOMY    . APPLICATION OF WOUND VAC Left 08/03/2019   Procedure: Application Of Wound Vac;  Surgeon: Altamese Oneonta, MD;  Location: [facility];  Service: Orthopedics;  Laterality: Left;  . CESAREAN SECTION    . ESOPHAGOGASTRODUODENOSCOPY (EGD) WITH PROPOFOL N/A 02/02/2016   Procedure: ESOPHAGOGASTRODUODENOSCOPY (EGD) WITH PROPOFOL;  Surgeon: Josefine Class, MD;  Location: [facility] ENDOSCOPY;  Service: Endoscopy;  Laterality: N/A;  . HAND SURGERY    . JOINT REPLACEMENT Bilateral    Knee   . ORIF ANKLE FRACTURE Left 08/03/2019   with wound vac applied   . ORIF ANKLE FRACTURE Left 08/03/2019   Procedure: OPEN REDUCTION INTERNAL FIXATION (ORIF) ANKLE FRACTURE;  Surgeon: Altamese Mariposa, MD;  Location: [facility];  Service: Orthopedics;  Laterality: Left;  . TIBIALIS  TENDON TRANSFER / REPAIR      Family History  Problem Relation Age of Onset  . Cancer Mother        Colon cancer   . Dementia Father   . Neuropathy Father     Social History   Socioeconomic History  . Marital status: Single    Spouse name: Not on file  . Number of children: Not on file  . Years of education: Not on file  . Highest education level: Not on file  Occupational History  . Not on file  Tobacco Use  . Smoking status: Never Smoker  . Smokeless tobacco: Never Used  Vaping Use  . Vaping Use: Never used  Substance and Sexual Activity  . Alcohol use: Yes    Alcohol/week: 7.0 - 14.0 standard drinks    Types: 7 - 14 Glasses of wine per week  . Drug use: Never  . Sexual activity: Not Currently  Other Topics Concern  . Not on file  Social History Narrative  . Not on file   Social Determinants of Health   Financial Resource Strain:   . Difficulty of Paying Living Expenses:   Food Insecurity:   . Worried About Charity fundraiser in the Last Year:   . Arboriculturist in the Last Year:   Transportation Needs:   . Film/video editor (Medical):   [name] Lack of Transportation (Non-Medical):  Physical Activity:   . Days of Exercise per Week:   . Minutes of Exercise per Session:   Stress:   . Feeling of Stress :   Social Connections:   . Frequency of Communication with Friends and Family:   . Frequency of Social Gatherings with Friends and Family:   . Attends Religious Services:   . Active Member of Clubs or Organizations:   . Attends Archivist Meetings:   [name] Marital Status:   Intimate Partner Violence:   . Fear of Current or Ex-Partner:   . Emotionally Abused:   [name] Physically Abused:   . Sexually Abused:     Outpatient Medications Prior to Visit  Medication Sig Dispense Refill  . amLODipine (NORVASC) 10 MG tablet TAKE 1 TABLET BY MOUTH EVERY DAY 90 tablet 3  . aspirin EC 81 MG tablet Take 81 mg by mouth 3 (three) times a week.     .  dicyclomine (BENTYL) 20 MG tablet Take 20 mg by mouth daily.     . insulin aspart (NOVOLOG) 100 UNIT/ML injection Inject 2-3 Units into the skin 3 (three) times daily before meals. Sliding scale    . insulin glargine (LANTUS) 100 UNIT/ML injection Inject 14 Units into the skin every evening. Reported on 02/02/2016    . levothyroxine (SYNTHROID, LEVOTHROID) 125 MCG tablet Take 125 mcg by mouth daily before breakfast.    . lisinopril-hydrochlorothiazide (ZESTORETIC) 10-12.5 MG tablet TAKE 1 TABLET BY MOUTH EVERY DAY 90 tablet 1  . LORazepam (ATIVAN) 0.5 MG tablet TAKE 1 TABLET (0.5 MG) BY MOUTH 1 TIMES PER DAY AS NEEDED FOR ANXIETY 30 tablet 2  . metoCLOPramide (REGLAN) 10 MG tablet Take 1 tablet (10 mg total) by mouth daily. 30 tablet 0  . Omega-3 Fatty Acids (FISH OIL PO) Take 1 capsule by mouth daily.    [name] omeprazole (PRILOSEC) 40 MG capsule Take 1 capsule (40 mg total) by mouth daily. 30 capsule 5  . ONETOUCH ULTRA test strip SMARTSIG:6 Via Meter 6 Times Daily    . Probiotic Product (PROBIOTIC PO) Take 1 capsule by mouth daily.     . rosuvastatin (CRESTOR) 10 MG tablet TAKE 1 TABLET BY MOUTH EVERY DAY 90 tablet 1  . venlafaxine XR (EFFEXOR-XR) 150 MG 24 hr capsule TAKE 1 CAPSULE BY MOUTH EVERY DAY 90 capsule 2  . Vitamin D, Ergocalciferol, (DRISDOL) 1.25 MG (50000 UNIT) CAPS capsule TAKE 1 CAPSULE BY MOUTH EACH WEEK 12 capsule 3  . docusate sodium (COLACE) 100 MG capsule Take 1 capsule (100 mg total) by mouth 2 (two) times daily. 30 capsule 1  . enoxaparin (LOVENOX) 40 MG/0.4ML injection Inject 0.4 mLs (40 mg total) into the skin daily for 14 days. 5.6 mL 0  . HYDROcodone-acetaminophen (NORCO) 5-325 MG tablet Take 1-2 tablets by mouth every 6 (six) hours as needed for moderate pain or severe pain. 50 tablet 0  . methocarbamol (ROBAXIN) 500 MG tablet Take 1 tablet (500 mg total) by mouth 4 (four) times daily. 60 tablet 1  . traZODone (DESYREL) 100 MG tablet TAKE 1 TABLET (100 MG TOTAL) BY MOUTH AT  BEDTIME AS NEEDED FOR UP TO 90 DOSES FOR SLEEP. 90 tablet 0  . vitamin C (VITAMIN C) 500 MG tablet Take 1 tablet (500 mg total) by mouth daily. 30 tablet 1   No facility-administered medications prior to visit.    No Known Allergies  ROS Review of Systems  Constitutional: Negative for fever.  Eyes:  Pt states eye exam 5 months ago-"floaters" pt states she only uses glasses for reading. Type 1 DM-retinopathy-eye doctor stated no need to operate or correct  Cardiovascular: Negative.   Gastrointestinal: Negative.   Endocrine:       DM -type 1-[place] endocrinologist-7.9% A1c-19months ago  Hypothyroid-TSH-normal 6 months ago  Genitourinary:       Genital herpes- Last pap smear 5 years ago  Musculoskeletal: Positive for arthralgias.       Pt with knee replacement in the past-does yoga daily with no concerns  Skin: Negative.   Hematological: Negative.       Objective:    Physical Exam Constitutional:      Appearance: Normal appearance.  HENT:     Head: Normocephalic and atraumatic.     Ears:     Comments: Left ear canal-cerumen Eyes:     Conjunctiva/sclera: Conjunctivae normal.  Cardiovascular:     Rate and Rhythm: Normal rate and regular rhythm.     Pulses: Normal pulses.     Heart sounds: Normal heart sounds.  Pulmonary:     Effort: Pulmonary effort is normal.     Breath sounds: Normal breath sounds.  Abdominal:     General: Abdomen is flat.  Musculoskeletal:        General: Normal range of motion.     Cervical back: Normal range of motion and neck supple.  Neurological:     Mental Status: She is alert and oriented to person, place, and time.  Psychiatric:        Mood and Affect: Mood normal.        Behavior: Behavior normal.    BP (!) 120/56 (BP Location: Left Arm, Patient Position: Sitting)   Pulse 92   Temp (!) 96.1 F (35.6 C) (Temporal)   Resp 16   Ht 5' 2.6" (1.59 m)   Wt 148 lb 12.8 oz (67.5 kg)   SpO2 97%   BMI 26.70 kg/m  Wt Readings  from Last 3 Encounters:  07/03/20 148 lb 12.8 oz (67.5 kg)  01/18/20 140 lb (63.5 kg)  08/03/19 150 lb (68 kg)     Lab Results  Component Value Date   TSH 3.571 08/03/2019   Lab Results  Component Value Date   HGBA1C 7.6 (H) 08/03/2019      Assessment & Plan:  1. Well adult exam-reviewed immunizations-Pneumo 23, HepB needed for school system along with PPD. Pt request order for Shingles vaccine Pneumo23 today, HepB #3-pharmacy order, Shingles-pharmacy order - PPD placed,  Pt to completed COVID vaccine in 3/21 Pt will return for PPD eval on Thursday Pt encouraged to take influenza vaccine when available and 3rd dose of COVID(Diabetes Type 1) Follow-up: COVID vaccine -3rd dose, PPD reading 36mins to review vaccine records, pts records, discuss history, complete PE and formulate plan Alveena Taira Hannah Beat, MD

## 2020-07-03 NOTE — Patient Instructions (Signed)
Return on Thursday  For PPD Take influenza vaccine when available Take 3rd COVID vaccine when recommended

## 2020-07-06 ENCOUNTER — Ambulatory Visit: Payer: PPO

## 2020-07-06 DIAGNOSIS — Z111 Encounter for screening for respiratory tuberculosis: Secondary | ICD-10-CM

## 2020-07-06 LAB — TB SKIN TEST
Induration: 0 mm
TB Skin Test: NEGATIVE

## 2020-07-06 NOTE — Progress Notes (Signed)
Patient came in today for PPD reading.  Results for orders placed or performed in visit on 07/03/20 (from the past 24 hour(s))  PPD     Status: Normal   Collection Time: 07/06/20  4:16 PM  Result Value Ref Range   TB Skin Test Negative    Induration 0 mm

## 2020-07-20 ENCOUNTER — Other Ambulatory Visit: Payer: Self-pay | Admitting: Family Medicine

## 2020-07-21 ENCOUNTER — Other Ambulatory Visit: Payer: Self-pay

## 2020-07-21 MED ORDER — LORAZEPAM 0.5 MG PO TABS: ORAL_TABLET | ORAL | 2 refills | Status: DC

## 2020-07-31 ENCOUNTER — Encounter: Payer: Self-pay | Admitting: Family Medicine

## 2020-07-31 ENCOUNTER — Ambulatory Visit (INDEPENDENT_AMBULATORY_CARE_PROVIDER_SITE_OTHER): Payer: PPO | Admitting: Family Medicine

## 2020-07-31 ENCOUNTER — Other Ambulatory Visit: Payer: Self-pay

## 2020-07-31 VITALS — BP 128/62 | HR 96 | Temp 97.3°F | Resp 18 | Ht 63.0 in | Wt 146.0 lb

## 2020-07-31 DIAGNOSIS — E039 Hypothyroidism, unspecified: Secondary | ICD-10-CM

## 2020-07-31 DIAGNOSIS — I10 Essential (primary) hypertension: Secondary | ICD-10-CM | POA: Diagnosis not present

## 2020-07-31 DIAGNOSIS — E1065 Type 1 diabetes mellitus with hyperglycemia: Secondary | ICD-10-CM | POA: Diagnosis not present

## 2020-07-31 DIAGNOSIS — E782 Mixed hyperlipidemia: Secondary | ICD-10-CM | POA: Diagnosis not present

## 2020-07-31 DIAGNOSIS — Z23 Encounter for immunization: Secondary | ICD-10-CM | POA: Diagnosis not present

## 2020-07-31 DIAGNOSIS — F331 Major depressive disorder, recurrent, moderate: Secondary | ICD-10-CM

## 2020-07-31 MED ORDER — BUPROPION HCL ER (XL) 150 MG PO TB24: 150.0000 mg | ORAL_TABLET | Freq: Every day | ORAL | 0 refills | Status: DC

## 2020-07-31 NOTE — Progress Notes (Signed)
Subjective:  Patient ID: [name], female    DOB: [birth year]  Age: 69 y.o. MRN: [id number]  Chief Complaint  Patient presents with  . Depression    HPI Depression - Long history of depression for 20 + years. Admitted twice in 2001.  She was admitted for 1 month at [facility] and received ECT treatments and later that year she was admitted to [facility] for ECT treatments.  Patient saw Dr. Toy Care in [place], who per the patient had tried her on numerous medications and essentially told her she could not help her.  It was at this time that she was hospitalized for the ECT treatments.  She really does not remember the ECT treatments help because she was sedated.  She then moved to [facility] and was started on Effexor XR which helped immensely. Lorazepam also helps with both her depression and anxiety.  She is been on this for the last 20 years and her dose has increased to 225 mg at times.  She is questioning whether maybe some other medications (newer) that she could try. She has done the counseling avenue and prefers not to go there. She feels it has been exhausted.   Patient is a type I diabetic who sees endocrinology.  Her last A1c was 7.9.  Sugars range from 50-400.  She checks her sugar 6 times a day.  Checks her feet daily.  Has annual eye exams.  Her diet is that of a vegetarian.  Patient is currently on NovoLog insulin sliding scale prior to meals, Lantus 14 units nightly.  For her cholesterol she is on Crestor 10 mg once daily and omega-3 fatty acids once daily.  Blood pressures are good at home.  She is currently on lisinopril/HCTZ 10/12.5 mg once daily and amlodipine 10 mg once daily.  She also takes a baby aspirin once daily.  Hypothyroidism.  Patient is on Synthroid 125 mcg daily.  Current Outpatient Medications on File Prior to Visit  Medication Sig Dispense Refill  . amLODipine (NORVASC) 10 MG tablet TAKE 1 TABLET BY MOUTH EVERY DAY 90 tablet 3  . aspirin EC 81 MG tablet Take 81 mg by  mouth 3 (three) times a week.     . dicyclomine (BENTYL) 20 MG tablet Take 20 mg by mouth daily.     . insulin aspart (NOVOLOG) 100 UNIT/ML injection Inject 2-3 Units into the skin 3 (three) times daily before meals. Sliding scale    . insulin glargine (LANTUS) 100 UNIT/ML injection Inject 14 Units into the skin every evening. Reported on 02/02/2016    . levothyroxine (SYNTHROID, LEVOTHROID) 125 MCG tablet Take 125 mcg by mouth daily before breakfast.    . lisinopril-hydrochlorothiazide (ZESTORETIC) 10-12.5 MG tablet TAKE 1 TABLET BY MOUTH EVERY DAY 90 tablet 1  . LORazepam (ATIVAN) 0.5 MG tablet TAKE 1 TABLET (0.5 MG) BY MOUTH 1 TIMES PER DAY AS NEEDED FOR ANXIETY 30 tablet 2  . metoCLOPramide (REGLAN) 10 MG tablet Take 1 tablet (10 mg total) by mouth daily. 30 tablet 0  . Omega-3 Fatty Acids (FISH OIL PO) Take 1 capsule by mouth daily.    [name] omeprazole (PRILOSEC) 40 MG capsule Take 1 capsule (40 mg total) by mouth daily. 30 capsule 5  . ONETOUCH ULTRA test strip SMARTSIG:6 Via Meter 6 Times Daily    . Probiotic Product (PROBIOTIC PO) Take 1 capsule by mouth daily.     . rosuvastatin (CRESTOR) 10 MG tablet TAKE 1 TABLET BY MOUTH EVERY DAY 90 tablet  1  . venlafaxine XR (EFFEXOR-XR) 150 MG 24 hr capsule TAKE 1 CAPSULE BY MOUTH EVERY DAY 90 capsule 2  . Vitamin D, Ergocalciferol, (DRISDOL) 1.25 MG (50000 UNIT) CAPS capsule TAKE 1 CAPSULE BY MOUTH EACH WEEK 12 capsule 3   No current facility-administered medications on file prior to visit.   Past Medical History:  Diagnosis Date  . Depression   . Diabetes mellitus without complication ([place])   . GERD (gastroesophageal reflux disease)   . Hypertension   . Hypothyroidism   . Type 1 diabetes [facility])    Past Surgical History:  Procedure Laterality Date  . APPENDECTOMY    . APPLICATION OF WOUND VAC Left 08/03/2019   Procedure: Application Of Wound Vac;  Surgeon: Altamese Elwood, MD;  Location: [place];  Service: Orthopedics;  Laterality: Left;  .  CESAREAN SECTION    . ESOPHAGOGASTRODUODENOSCOPY (EGD) WITH PROPOFOL N/A 02/02/2016   Procedure: ESOPHAGOGASTRODUODENOSCOPY (EGD) WITH PROPOFOL;  Surgeon: Josefine Class, MD;  Location: [facility] ENDOSCOPY;  Service: Endoscopy;  Laterality: N/A;  . HAND SURGERY    . JOINT REPLACEMENT Bilateral    Knee   . ORIF ANKLE FRACTURE Left 08/03/2019   with wound vac applied   . ORIF ANKLE FRACTURE Left 08/03/2019   Procedure: OPEN REDUCTION INTERNAL FIXATION (ORIF) ANKLE FRACTURE;  Surgeon: Altamese Strafford, MD;  Location: [place];  Service: Orthopedics;  Laterality: Left;  . TIBIALIS TENDON TRANSFER / REPAIR      Family History  Problem Relation Age of Onset  . Cancer Mother        Colon cancer   . Dementia Father   . Neuropathy Father    Social History   Socioeconomic History  . Marital status: Single    Spouse name: Not on file  . Number of children: Not on file  . Years of education: Not on file  . Highest education level: Not on file  Occupational History  . Not on file  Tobacco Use  . Smoking status: Never Smoker  . Smokeless tobacco: Never Used  Vaping Use  . Vaping Use: Never used  Substance and Sexual Activity  . Alcohol use: Yes    Alcohol/week: 7.0 - 14.0 standard drinks    Types: 7 - 14 Glasses of wine per week  . Drug use: Never  . Sexual activity: Not Currently  Other Topics Concern  . Not on file  Social History Narrative  . Not on file   Social Determinants of Health   Financial Resource Strain:   . Difficulty of Paying Living Expenses: Not on file  Food Insecurity:   . Worried About Charity fundraiser in the Last Year: Not on file  . Ran Out of Food in the Last Year: Not on file  Transportation Needs:   . Lack of Transportation (Medical): Not on file  . Lack of Transportation (Non-Medical): Not on file  Physical Activity:   . Days of Exercise per Week: Not on file  . Minutes of Exercise per Session: Not on file  Stress:   . Feeling of Stress : Not on file   Social Connections:   . Frequency of Communication with Friends and Family: Not on file  . Frequency of Social Gatherings with Friends and Family: Not on file  . Attends Religious Services: Not on file  . Active Member of Clubs or Organizations: Not on file  . Attends Archivist Meetings: Not on file  . Marital Status: Not on file  Review of Systems  Constitutional: Positive for fatigue. Negative for chills and fever.  HENT: Negative for congestion, ear pain and sore throat.   Respiratory: Negative for cough and shortness of breath.   Cardiovascular: Negative for chest pain.  Gastrointestinal: Negative for abdominal pain, constipation, diarrhea, nausea and vomiting.  Endocrine: Positive for polyuria. Negative for polydipsia and polyphagia.  Genitourinary: Negative for dysuria and urgency.  Musculoskeletal: Positive for arthralgias. Negative for myalgias.  Skin: Negative for rash.  Neurological: Negative for dizziness and headaches.  Psychiatric/Behavioral: Positive for decreased concentration, dysphoric mood and sleep disturbance. Negative for suicidal ideas. The patient is nervous/anxious.      Objective:  BP 128/62   Pulse 96   Temp (!) 97.3 F (36.3 C)   Resp 18   Ht 5\' 3"  (1.6 m)   Wt 146 lb (66.2 kg)   BMI 25.86 kg/m   BP/Weight 07/31/2020 3/53/6144 01/16/5399  Systolic BP 867 619 -  Diastolic BP 62 56 -  Wt. (Lbs) 146 148.8 140  BMI 25.86 26.7 24.8    Physical Exam Vitals reviewed.  Constitutional:      Appearance: Normal appearance. She is normal weight.  Cardiovascular:     Rate and Rhythm: Normal rate and regular rhythm.     Pulses: Normal pulses.     Heart sounds: Normal heart sounds.  Pulmonary:     Effort: Pulmonary effort is normal. No respiratory distress.     Breath sounds: Normal breath sounds.  Neurological:     Mental Status: She is alert and oriented to person, place, and time.  Psychiatric:     Comments: Depressed.    Lab  Results  Component Value Date   WBC 8.7 08/05/2019   HGB 9.9 (L) 08/05/2019   HCT 30.8 (L) 08/05/2019   PLT 300 08/05/2019   GLUCOSE 336 (H) 08/05/2019   ALT 18 08/03/2019   AST 19 08/03/2019   NA 131 (L) 08/05/2019   K 5.3 (H) 08/05/2019   CL 97 (L) 08/05/2019   CREATININE 0.85 08/05/2019   BUN 12 08/05/2019   CO2 25 08/05/2019   TSH 3.571 08/03/2019   INR 0.9 08/03/2019   HGBA1C 7.6 (H) 08/03/2019      Assessment & Plan:  1. Essential hypertension Well controlled.  No changes to medicines.  Continue to work on eating a healthy diet and exercise.   2. Depression, major, recurrent, moderate (HCC) Not at goal. Continue effexor xr 150 mg once daily in am.  Start on wellbutrin xl 150 mg once daily in am.  - buPROPion (WELLBUTRIN XL) 150 MG 24 hr tablet; Take 1 tablet (150 mg total) by mouth daily.  Dispense: 30 tablet; Refill: 0  3. Need for immunization against influenza - Flu Vaccine QUAD High Dose(Fluad)  4. Type 1 diabetes mellitus with hyperglycemia (HCC) Recommend follow up with endocrinology for follow up.   5. Mixed hyperlipidemia Well controlled.  No changes to medicines.  Continue to work on eating a healthy diet and exercise.   6. Acquired hypothyroidism - TSH future order  Other future orders: cbc,cmp, lipid panel.   Meds ordered this encounter  Medications  . buPROPion (WELLBUTRIN XL) 150 MG 24 hr tablet    Sig: Take 1 tablet (150 mg total) by mouth daily.    Dispense:  30 tablet    Refill:  0    Orders Placed This Encounter  Procedures  . Flu Vaccine QUAD High Dose(Fluad)     Follow-up: Return in about  4 weeks (around 08/28/2020) for DEPRESSION.[name]  An After Visit Summary was printed and given to the patient.  Rochel Brome [facility] [phone]

## 2020-07-31 NOTE — Patient Instructions (Addendum)
[facility]: [phone] START ON WELLBUTRIN SR 150 MG ONCE DAILY IN AM.  CONTINUE EFFEXOR XR 150 MG ONCE DAILY IN AM.

## 2020-08-03 DIAGNOSIS — M791 Myalgia, unspecified site: Secondary | ICD-10-CM | POA: Diagnosis not present

## 2020-08-03 DIAGNOSIS — R5383 Other fatigue: Secondary | ICD-10-CM | POA: Diagnosis not present

## 2020-08-03 DIAGNOSIS — Z20828 Contact with and (suspected) exposure to other viral communicable diseases: Secondary | ICD-10-CM | POA: Diagnosis not present

## 2020-08-03 DIAGNOSIS — R110 Nausea: Secondary | ICD-10-CM | POA: Diagnosis not present

## 2020-08-16 ENCOUNTER — Other Ambulatory Visit: Payer: Self-pay | Admitting: Family Medicine

## 2020-08-16 DIAGNOSIS — F331 Major depressive disorder, recurrent, moderate: Secondary | ICD-10-CM

## 2020-08-20 DIAGNOSIS — R2681 Unsteadiness on feet: Secondary | ICD-10-CM | POA: Diagnosis not present

## 2020-08-20 DIAGNOSIS — Z20828 Contact with and (suspected) exposure to other viral communicable diseases: Secondary | ICD-10-CM | POA: Diagnosis not present

## 2020-08-28 ENCOUNTER — Other Ambulatory Visit: Payer: Self-pay

## 2020-08-28 ENCOUNTER — Ambulatory Visit (INDEPENDENT_AMBULATORY_CARE_PROVIDER_SITE_OTHER): Payer: PPO | Admitting: Neurology

## 2020-08-28 ENCOUNTER — Encounter: Payer: Self-pay | Admitting: Neurology

## 2020-08-28 VITALS — BP 131/62 | HR 97 | Ht 63.0 in | Wt 146.2 lb

## 2020-08-28 DIAGNOSIS — R413 Other amnesia: Secondary | ICD-10-CM | POA: Diagnosis not present

## 2020-08-28 NOTE — Patient Instructions (Signed)
Good to see you doing well. Follow-up as needed, call for any changes   RECOMMENDATIONS FOR ALL PATIENTS WITH MEMORY PROBLEMS: 1. Continue to exercise (Recommend 30 minutes of walking everyday, or 3 hours every week) 2. Increase social interactions - continue going to [place] and enjoy social gatherings with friends and family 3. Eat healthy, avoid fried foods and eat more fruits and vegetables 4. Maintain adequate blood pressure, blood sugar, and blood cholesterol level. Reducing the risk of stroke and cardiovascular disease also helps promoting better memory. 5. Avoid stressful situations. Live a simple life and avoid aggravations. Organize your time and prepare for the next day in anticipation. 6. Sleep well, avoid any interruptions of sleep and avoid any distractions in the bedroom that may interfere with adequate sleep quality 7. Avoid sugar, avoid sweets as there is a strong link between excessive sugar intake, diabetes, and cognitive impairment We discussed the Mediterranean diet, which has been shown to help patients reduce the risk of progressive memory disorders and reduces cardiovascular risk. This includes eating fish, eat fruits and green leafy vegetables, nuts like almonds and hazelnuts, walnuts, and also use olive oil. Avoid fast foods and fried foods as much as possible. Avoid sweets and sugar as sugar use has been linked to worsening of memory function.

## 2020-08-28 NOTE — Progress Notes (Signed)
NEUROLOGY FOLLOW UP OFFICE NOTE  [name] [id number] 1951/07/22  HISTORY OF PRESENT ILLNESS: I had the pleasure of seeing [name] in follow-up in the neurology clinic on 08/28/2020. She is alone in the office today. The patient was last seen 7 months ago for memory loss. SLUMS score 19/30 in 01/2020 (Power blind 20/22 in 03/2019). Since her last visit, she feels like her memory is fine. She continues to work as a Oceanographer. She denies missing medications or bill payments. She denies misplacing things frequently. Family has not mentioned any concerns. She denies any significant headaches. She started Wellbutrin and had some dizziness initially. No vision changes, focal numbness/tingling/weakness. She fell on the porch steps 2 weeks ago, no injuries. She gets 7-8 hour of sleep but does not feel rested. She reports seeing a sleep specialist and "did not think I needed to be tested." She uses an app on her phone for meditation which helps.    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 She started noticing changes last school year, she was told by co-workers that she was asking the same questions. Her son has anxiety and has been concerned because she comes down to the kitchen and does not see him to her side. He thinks there is something wrong. She has occasional word-finding difficulties. She denies getting lost driving, no missed bills or medications. She denies any difficulties with ADLs. She drinks 1-2 glasses of wine every night for the past 8-10 years. She denies any head injuries. She reports they were told her father has dementia, but they are unsure if true. Her sister had strokes with some cognitive changes. She has a history of anxiety and depression, reporting rare anxiety. She denies any worsening stress, but that she had to learn a lot  of new things for the new school year. She denies any headaches, dizziness, diplopia, dysarthria, dysphagia, neck/back pain, focal numbness/tingling/weakness, bowel/bladder dysfunction. No anosmia, tremors, no falls.   TSH and B12 done at PCP office were normal. She had an MRI brain with and without contrast done 04/05/16 which was reported as unremarkable, mild chronic small vessel disease and atrophy, no acute changes. Images unavailable for review.  PAST MEDICAL HISTORY: Past Medical History:  Diagnosis Date  . Depression   . Diabetes mellitus without complication ([place])   . GERD (gastroesophageal reflux disease)   . Hypertension   . Hypothyroidism   . Type 1 diabetes ([place])     MEDICATIONS: Current Outpatient Medications on File Prior to Visit  Medication Sig Dispense Refill  . amLODipine (NORVASC) 10 MG tablet TAKE 1 TABLET BY MOUTH EVERY DAY 90 tablet 3  . aspirin EC 81 MG tablet Take 81 mg by mouth 2 (two) times a week.     [name] buPROPion (WELLBUTRIN XL) 150 MG 24 hr tablet TAKE 1 TABLET BY MOUTH EVERY DAY 90 tablet 0  . dicyclomine (BENTYL) 20 MG tablet Take 20 mg by mouth daily.     . insulin aspart (NOVOLOG) 100 UNIT/ML injection Inject 2-3 Units into the skin 3 (three) times daily before meals. Sliding scale    . insulin glargine (LANTUS) 100 UNIT/ML injection Inject 14 Units into the skin every evening. Reported on 02/02/2016    . levothyroxine (SYNTHROID, LEVOTHROID) 125 MCG tablet Take 125 mcg by mouth daily before breakfast.    . lisinopril-hydrochlorothiazide (ZESTORETIC) 10-12.5 MG tablet TAKE  1 TABLET BY MOUTH EVERY DAY 90 tablet 1  . LORazepam (ATIVAN) 0.5 MG tablet TAKE 1 TABLET (0.5 MG) BY MOUTH 1 TIMES PER DAY AS NEEDED FOR ANXIETY 30 tablet 2  . metoCLOPramide (REGLAN) 10 MG tablet Take 1 tablet (10 mg total) by mouth daily. 30 tablet 0  . Omega-3 Fatty Acids (FISH OIL PO) Take 1 capsule by mouth daily.    [name] omeprazole (PRILOSEC) 40 MG capsule TAKE 1 CAPSULE BY MOUTH  EVERY DAY 90 capsule 3  . ONETOUCH ULTRA test strip SMARTSIG:6 Via Meter 6 Times Daily    . rosuvastatin (CRESTOR) 10 MG tablet TAKE 1 TABLET BY MOUTH EVERY DAY 90 tablet 1  . venlafaxine XR (EFFEXOR-XR) 150 MG 24 hr capsule TAKE 1 CAPSULE BY MOUTH EVERY DAY 90 capsule 2  . Vitamin D, Ergocalciferol, (DRISDOL) 1.25 MG (50000 UNIT) CAPS capsule TAKE 1 CAPSULE BY MOUTH EACH WEEK 12 capsule 3  . Probiotic Product (PROBIOTIC PO) Take 1 capsule by mouth daily.  (Patient not taking: Reported on 08/28/2020)     No current facility-administered medications on file prior to visit.    ALLERGIES: No Known Allergies  FAMILY HISTORY: Family History  Problem Relation Age of Onset  . Cancer Mother        Colon cancer   . Dementia Father   . Neuropathy Father     SOCIAL HISTORY: Social History   Socioeconomic History  . Marital status: Single    Spouse name: Not on file  . Number of children: Not on file  . Years of education: Not on file  . Highest education level: Not on file  Occupational History  . Not on file  Tobacco Use  . Smoking status: Never Smoker  . Smokeless tobacco: Never Used  Vaping Use  . Vaping Use: Never used  Substance and Sexual Activity  . Alcohol use: Yes    Alcohol/week: 7.0 - 14.0 standard drinks    Types: 7 - 14 Glasses of wine per week  . Drug use: Never  . Sexual activity: Not Currently  Other Topics Concern  . Not on file  Social History Narrative   Right hand   Lives alone   Social Determinants of Health   Financial Resource Strain:   . Difficulty of Paying Living Expenses: Not on file  Food Insecurity:   . Worried About Charity fundraiser in the Last Year: Not on file  . Ran Out of Food in the Last Year: Not on file  Transportation Needs:   . Lack of Transportation (Medical): Not on file  . Lack of Transportation (Non-Medical): Not on file  Physical Activity:   . Days of Exercise per Week: Not on file  . Minutes of Exercise per Session:  Not on file  Stress:   . Feeling of Stress : Not on file  Social Connections:   . Frequency of Communication with Friends and Family: Not on file  . Frequency of Social Gatherings with Friends and Family: Not on file  . Attends Religious Services: Not on file  . Active Member of Clubs or Organizations: Not on file  . Attends Archivist Meetings: Not on file  . Marital Status: Not on file  Intimate Partner Violence:   . Fear of Current or Ex-Partner: Not on file  . Emotionally Abused: Not on file  . Physically Abused: Not on file  . Sexually Abused: Not on file     PHYSICAL EXAM: Vitals:  08/28/20 1503  BP: 131/62  Pulse: 97  SpO2: 98%   General: No acute distress Head:  Normocephalic/atraumatic Skin/Extremities: No rash, no edema Neurological Exam: alert and oriented to person, place, and time. No aphasia or dysarthria. Fund of knowledge is appropriate.  Recent and remote memory are intact.  Attention and concentration are normal. MMSE 30/30 MMSE - Mini Mental State Exam 08/28/2020  Orientation to time 5  Orientation to Place 5  Registration 3  Attention/ Calculation 5  Recall 3  Language- name 2 objects 2  Language- repeat 1  Language- follow 3 step command 3  Language- read & follow direction 1  Write a sentence 1  Copy design 1  Total score 30   Cranial nerves: Pupils equal, round. Extraocular movements intact with no nystagmus. Visual fields full.  No facial asymmetry.  Motor: Bulk and tone normal, muscle strength 5/5 throughout with no pronator drift.   Finger to nose testing intact.  Gait narrow-based and steady, able to tandem walk adequately.  Romberg negative.   IMPRESSION: This is a 69 yo RH woman with a history of hypertension, hyperlipidemia, diabetes, who presented in 2017 for worsening memory. She denies any significant memory concerns, denies any difficulties with complex tasks, however she is again alone in the office with no family to  corroborate history. MMSE today 30/30.  We had previously discussed doing Neurocognitive testing to further evaluate cognitive concerns, she feels she is doing fine and would like to follow-up as needed. She knows to call for any changes.   Thank you for allowing me to participate in her care.  Please do not hesitate to call for any questions or concerns.   Ellouise Newer, M.D.   CC: Dr. Tobie Poet

## 2020-09-01 ENCOUNTER — Other Ambulatory Visit: Payer: Self-pay

## 2020-09-01 ENCOUNTER — Ambulatory Visit (INDEPENDENT_AMBULATORY_CARE_PROVIDER_SITE_OTHER): Payer: PPO | Admitting: Family Medicine

## 2020-09-01 ENCOUNTER — Encounter: Payer: Self-pay | Admitting: Family Medicine

## 2020-09-01 VITALS — BP 130/60 | HR 88 | Temp 97.5°F | Resp 16 | Ht 63.0 in | Wt 145.0 lb

## 2020-09-01 DIAGNOSIS — I10 Essential (primary) hypertension: Secondary | ICD-10-CM

## 2020-09-01 DIAGNOSIS — E039 Hypothyroidism, unspecified: Secondary | ICD-10-CM | POA: Diagnosis not present

## 2020-09-01 DIAGNOSIS — D229 Melanocytic nevi, unspecified: Secondary | ICD-10-CM | POA: Diagnosis not present

## 2020-09-01 DIAGNOSIS — E1065 Type 1 diabetes mellitus with hyperglycemia: Secondary | ICD-10-CM

## 2020-09-01 NOTE — Patient Instructions (Signed)
Increase wellbutrin xl to 300 mg total daily in am.  Continue other medicines.

## 2020-09-01 NOTE — Progress Notes (Signed)
Subjective:  Patient ID: [name], female    DOB: 08-06-1951  Age: 69 y.o. MRN: [id number]  Chief Complaint  Patient presents with  . Hypertension  . Depression  . Nevus   Major depression - Long history of depression for 20 + years. Admitted twice in 2001.  She was admitted for 1 month at [facility] and received ECT treatments and later that year she was admitted to [facility] for ECT treatments. Continue effexor xr 150 mg once in am. She was started on wellbutrin xl 150 mg once daily in am.  Lorazepam also helps with both her depression and anxiety.  She does not want to pursue counseling. It has helped, but she does feel it maybe better to increase.   Patient is a type I diabetic who sees endocrinology.  Her last A1c was 7.9.  Sugars range from 40-400.  She checks her sugar 6 times a day.  Checks her feet daily.  Has annual eye exams.     For her cholesterol she is on Crestor 10 mg once daily and omega-3 fatty acids once daily.    Blood pressures are good at home.  She is currently on lisinopril/HCTZ 10/12.5 mg once daily and amlodipine 10 mg once daily.  She also takes a baby aspirin once daily.  Hypothyroidism.  Patient is on Synthroid 125 mcg daily.  Concerned about itchy, irritated lesion on her back.   Current Outpatient Medications on File Prior to Visit  Medication Sig Dispense Refill  . amLODipine (NORVASC) 10 MG tablet TAKE 1 TABLET BY MOUTH EVERY DAY 90 tablet 3  . aspirin EC 81 MG tablet Take 81 mg by mouth 2 (two) times a week.     [name] buPROPion (WELLBUTRIN XL) 150 MG 24 hr tablet TAKE 1 TABLET BY MOUTH EVERY DAY 90 tablet 0  . dicyclomine (BENTYL) 20 MG tablet Take 20 mg by mouth daily.     . insulin aspart (NOVOLOG) 100 UNIT/ML injection Inject 2-3 Units into the skin 3 (three) times daily before meals. Sliding scale    . insulin glargine (LANTUS) 100 UNIT/ML injection Inject 14 Units into the skin every evening. Reported on 02/02/2016    . levothyroxine (SYNTHROID, LEVOTHROID)  125 MCG tablet Take 125 mcg by mouth daily before breakfast.    . lisinopril-hydrochlorothiazide (ZESTORETIC) 10-12.5 MG tablet TAKE 1 TABLET BY MOUTH EVERY DAY 90 tablet 1  . LORazepam (ATIVAN) 0.5 MG tablet TAKE 1 TABLET (0.5 MG) BY MOUTH 1 TIMES PER DAY AS NEEDED FOR ANXIETY 30 tablet 2  . metoCLOPramide (REGLAN) 10 MG tablet Take 1 tablet (10 mg total) by mouth daily. 30 tablet 0  . Omega-3 Fatty Acids (FISH OIL PO) Take 1 capsule by mouth daily.    [name] omeprazole (PRILOSEC) 40 MG capsule TAKE 1 CAPSULE BY MOUTH EVERY DAY 90 capsule 3  . ONETOUCH ULTRA test strip SMARTSIG:6 Via Meter 6 Times Daily    . Probiotic Product (PROBIOTIC PO) Take 1 capsule by mouth daily.  (Patient not taking: Reported on 08/28/2020)    . rosuvastatin (CRESTOR) 10 MG tablet TAKE 1 TABLET BY MOUTH EVERY DAY 90 tablet 1  . venlafaxine XR (EFFEXOR-XR) 150 MG 24 hr capsule TAKE 1 CAPSULE BY MOUTH EVERY DAY 90 capsule 2  . Vitamin D, Ergocalciferol, (DRISDOL) 1.25 MG (50000 UNIT) CAPS capsule TAKE 1 CAPSULE BY MOUTH EACH WEEK 12 capsule 3   No current facility-administered medications on file prior to visit.   Past Medical History:  Diagnosis  Date  . Depression   . Diabetes mellitus without complication ([place])   . GERD (gastroesophageal reflux disease)   . Hypertension   . Hypothyroidism   . Type 1 diabetes [facility])    Past Surgical History:  Procedure Laterality Date  . APPENDECTOMY    . APPLICATION OF WOUND VAC Left 08/03/2019   Procedure: Application Of Wound Vac;  Surgeon: Altamese Lake Isabella, MD;  Location: [place];  Service: Orthopedics;  Laterality: Left;  . CESAREAN SECTION    . ESOPHAGOGASTRODUODENOSCOPY (EGD) WITH PROPOFOL N/A 02/02/2016   Procedure: ESOPHAGOGASTRODUODENOSCOPY (EGD) WITH PROPOFOL;  Surgeon: Josefine Class, MD;  Location: [facility] ENDOSCOPY;  Service: Endoscopy;  Laterality: N/A;  . HAND SURGERY    . JOINT REPLACEMENT Bilateral    Knee   . ORIF ANKLE FRACTURE Left 08/03/2019   with wound vac  applied   . ORIF ANKLE FRACTURE Left 08/03/2019   Procedure: OPEN REDUCTION INTERNAL FIXATION (ORIF) ANKLE FRACTURE;  Surgeon: Altamese Tickfaw, MD;  Location: [place];  Service: Orthopedics;  Laterality: Left;  . TIBIALIS TENDON TRANSFER / REPAIR      Family History  Problem Relation Age of Onset  . Cancer Mother        Colon cancer   . Dementia Father   . Neuropathy Father    Social History   Socioeconomic History  . Marital status: Single    Spouse name: Not on file  . Number of children: Not on file  . Years of education: Not on file  . Highest education level: Not on file  Occupational History  . Not on file  Tobacco Use  . Smoking status: Never Smoker  . Smokeless tobacco: Never Used  Vaping Use  . Vaping Use: Never used  Substance and Sexual Activity  . Alcohol use: Yes    Alcohol/week: 7.0 - 14.0 standard drinks    Types: 7 - 14 Glasses of wine per week  . Drug use: Never  . Sexual activity: Not Currently  Other Topics Concern  . Not on file  Social History Narrative   Right hand   Lives alone   Social Determinants of Health   Financial Resource Strain:   . Difficulty of Paying Living Expenses: Not on file  Food Insecurity:   . Worried About Charity fundraiser in the Last Year: Not on file  . Ran Out of Food in the Last Year: Not on file  Transportation Needs:   . Lack of Transportation (Medical): Not on file  . Lack of Transportation (Non-Medical): Not on file  Physical Activity:   . Days of Exercise per Week: Not on file  . Minutes of Exercise per Session: Not on file  Stress:   . Feeling of Stress : Not on file  Social Connections:   . Frequency of Communication with Friends and Family: Not on file  . Frequency of Social Gatherings with Friends and Family: Not on file  . Attends Religious Services: Not on file  . Active Member of Clubs or Organizations: Not on file  . Attends Archivist Meetings: Not on file  . Marital Status: Not on file      Review of Systems  Constitutional: Negative for chills, fatigue and fever.  HENT: Negative for congestion, ear pain and sore throat.   Respiratory: Negative for cough and shortness of breath.   Cardiovascular: Negative for chest pain.  Gastrointestinal: Negative for abdominal pain, constipation, diarrhea, nausea and vomiting.  Endocrine: Negative for polydipsia, polyphagia  and polyuria.  Genitourinary: Negative for dysuria and urgency.  Musculoskeletal: Positive for arthralgias. Negative for myalgias.  Skin: Negative for rash.  Neurological: Negative for dizziness and headaches.  Psychiatric/Behavioral: Positive for depression. Negative for decreased concentration, dysphoric mood, sleep disturbance and suicidal ideas. The patient is not nervous/anxious.      Objective:  BP 130/60   Pulse 88   Temp (!) 97.5 F (36.4 C)   Resp 16   Ht 5\' 3"  (1.6 m)   Wt 145 lb (65.8 kg)   BMI 25.69 kg/m   BP/Weight 09/01/2020 08/28/2020 8/58/8502  Systolic BP 774 128 786  Diastolic BP 60 62 62  Wt. (Lbs) 145 146.2 146  BMI 25.69 25.9 25.86    Physical Exam Vitals reviewed.  Constitutional:      Appearance: Normal appearance. She is normal weight.  Cardiovascular:     Rate and Rhythm: Normal rate and regular rhythm.     Pulses: Normal pulses.     Heart sounds: Normal heart sounds.  Pulmonary:     Effort: Pulmonary effort is normal. No respiratory distress.     Breath sounds: Normal breath sounds.  Skin:    Findings: Lesion (atypical nevi upper left shoulder) present.  Neurological:     Mental Status: She is alert and oriented to person, place, and time.  Psychiatric:        Mood and Affect: Mood normal.        Behavior: Behavior normal.    Lab Results  Component Value Date   WBC 8.7 08/05/2019   HGB 9.9 (L) 08/05/2019   HCT 30.8 (L) 08/05/2019   PLT 300 08/05/2019   GLUCOSE 336 (H) 08/05/2019   ALT 18 08/03/2019   AST 19 08/03/2019   NA 131 (L) 08/05/2019   K 5.3 (H)  08/05/2019   CL 97 (L) 08/05/2019   CREATININE 0.85 08/05/2019   BUN 12 08/05/2019   CO2 25 08/05/2019   TSH 3.571 08/03/2019   INR 0.9 08/03/2019   HGBA1C 7.6 (H) 08/03/2019      Assessment & Plan:  1. Essential hypertension Well controlled.  No changes to medicines.  Continue to work on eating a healthy diet and exercise.  Labs drawn today.  - CBC with Differential/Platelet - Comprehensive metabolic panel  2. Type 1 diabetes mellitus with hyperglycemia (HCC) Control: poorly Recommend check sugars fasting daily. Recommend check feet daily. Recommend annual eye exams. Medicines: recommend increase lantus 16 U daily.  Continue to work on eating a healthy diet and exercise.  Labs drawn today.   - Hemoglobin A1c  3. Acquired hypothyroidism - TSH  4. Atypical nevi - Ambulatory referral to Dermatology  5. Depression Improved. Continue effexor xr 150 mg once daily in am.  Continue on wellbutrin xl 150 mg once daily in am.   Orders Placed This Encounter  Procedures  . CBC with Differential/Platelet  . Comprehensive metabolic panel  . TSH  . Hemoglobin A1c  . Ambulatory referral to Dermatology     Follow-up: Return in about 3 months (around 12/02/2020) for fasting visit.  An After Visit Summary was printed and given to the patient.  Rochel Brome [facility] [phone]

## 2020-09-02 LAB — CBC WITH DIFFERENTIAL/PLATELET
Basophils Absolute: 0.1 10*3/uL (ref 0.0–0.2)
Basos: 2 %
EOS (ABSOLUTE): 0.3 10*3/uL (ref 0.0–0.4)
Eos: 5 %
Hematocrit: 34.5 % (ref 34.0–46.6)
Hemoglobin: 11 g/dL — ABNORMAL LOW (ref 11.1–15.9)
Immature Grans (Abs): 0 10*3/uL (ref 0.0–0.1)
Immature Granulocytes: 0 %
Lymphocytes Absolute: 1.1 10*3/uL (ref 0.7–3.1)
Lymphs: 19 %
MCH: 27.2 pg (ref 26.6–33.0)
MCHC: 31.9 g/dL (ref 31.5–35.7)
MCV: 85 fL (ref 79–97)
Monocytes Absolute: 0.7 10*3/uL (ref 0.1–0.9)
Monocytes: 11 %
Neutrophils Absolute: 3.8 10*3/uL (ref 1.4–7.0)
Neutrophils: 63 %
Platelets: 405 10*3/uL (ref 150–450)
RBC: 4.05 x10E6/uL (ref 3.77–5.28)
RDW: 13.4 % (ref 11.7–15.4)
WBC: 6.1 10*3/uL (ref 3.4–10.8)

## 2020-09-02 LAB — COMPREHENSIVE METABOLIC PANEL
ALT: 13 IU/L (ref 0–32)
AST: 17 IU/L (ref 0–40)
Albumin/Globulin Ratio: 1.8 (ref 1.2–2.2)
Albumin: 4.5 g/dL (ref 3.8–4.8)
Alkaline Phosphatase: 114 IU/L (ref 44–121)
BUN/Creatinine Ratio: 18 (ref 12–28)
BUN: 15 mg/dL (ref 8–27)
Bilirubin Total: <0.2 mg/dL (ref 0.0–1.2)
CO2: 25 mmol/L (ref 20–29)
Calcium: 10 mg/dL (ref 8.7–10.3)
Chloride: 94 mmol/L — ABNORMAL LOW (ref 96–106)
Creatinine, Ser: 0.85 mg/dL (ref 0.57–1.00)
GFR calc Af Amer: 80 mL/min/{1.73_m2} (ref >59)
GFR calc non Af Amer: 70 mL/min/{1.73_m2} (ref >59)
Globulin, Total: 2.5 g/dL (ref 1.5–4.5)
Glucose: 63 mg/dL — ABNORMAL LOW (ref 65–99)
Potassium: 4.3 mmol/L (ref 3.5–5.2)
Sodium: 134 mmol/L (ref 134–144)
Total Protein: 7 g/dL (ref 6.0–8.5)

## 2020-09-02 LAB — HEMOGLOBIN A1C
Est. average glucose Bld gHb Est-mCnc: 209 mg/dL
Hgb A1c MFr Bld: 8.9 % — ABNORMAL HIGH (ref 4.8–5.6)

## 2020-09-02 LAB — TSH: TSH: 0.779 u[IU]/mL (ref 0.450–4.500)

## 2020-09-20 DIAGNOSIS — E1065 Type 1 diabetes mellitus with hyperglycemia: Secondary | ICD-10-CM | POA: Diagnosis not present

## 2020-09-25 DIAGNOSIS — E1065 Type 1 diabetes mellitus with hyperglycemia: Secondary | ICD-10-CM | POA: Diagnosis not present

## 2020-09-25 DIAGNOSIS — E039 Hypothyroidism, unspecified: Secondary | ICD-10-CM | POA: Diagnosis not present

## 2020-09-25 DIAGNOSIS — E10319 Type 1 diabetes mellitus with unspecified diabetic retinopathy without macular edema: Secondary | ICD-10-CM | POA: Diagnosis not present

## 2020-09-27 ENCOUNTER — Telehealth: Payer: Self-pay | Admitting: Family Medicine

## 2020-09-27 NOTE — Progress Notes (Signed)
  Chronic Care Management   Outreach Note  09/27/2020 Name: [name] MRN: [id number] DOB: 07-08-1951  Referred by: Rochel Brome, MD Reason for referral : Chronic Care Management   An unsuccessful telephone outreach was attempted today. The patient was referred to the pharmacist for assistance with care management and care coordination.   Follow Up Plan:   Hilario Quarry  Upstream Scheduler

## 2020-10-04 DIAGNOSIS — M79675 Pain in left toe(s): Secondary | ICD-10-CM | POA: Diagnosis not present

## 2020-10-17 ENCOUNTER — Other Ambulatory Visit: Payer: Self-pay | Admitting: Family Medicine

## 2020-10-17 DIAGNOSIS — F331 Major depressive disorder, recurrent, moderate: Secondary | ICD-10-CM

## 2020-11-01 ENCOUNTER — Other Ambulatory Visit: Payer: Self-pay | Admitting: Family Medicine

## 2020-11-07 ENCOUNTER — Other Ambulatory Visit: Payer: Self-pay | Admitting: Family Medicine

## 2020-11-07 ENCOUNTER — Other Ambulatory Visit: Payer: Self-pay | Admitting: Physician Assistant

## 2020-11-07 DIAGNOSIS — F331 Major depressive disorder, recurrent, moderate: Secondary | ICD-10-CM

## 2020-11-17 ENCOUNTER — Other Ambulatory Visit: Payer: Self-pay | Admitting: Family Medicine

## 2020-11-20 ENCOUNTER — Telehealth: Payer: Self-pay | Admitting: Family Medicine

## 2020-11-20 NOTE — Chronic Care Management (AMB) (Signed)
  Chronic Care Management   Note  11/20/2020 Name: Lamari Youngers MRN: [id number] DOB: Aug 17, 1951  Jacoby Ritsema is a 70 y.o. year old female who is a primary care patient of Cox, Kirsten, MD. I reached out to Ellamae Sia by phone today in response to a referral sent by Ms. Sarina Ser PCP, Cox, Kirsten, MD.   Ms. Colla was given information about Chronic Care Management services today including:  1. CCM service includes personalized support from designated clinical staff supervised by her physician, including individualized plan of care and coordination with other care providers 2. 24/7 contact phone numbers for assistance for urgent and routine care needs. 3. Service will only be billed when office clinical staff spend 20 minutes or more in a month to coordinate care. 4. Only one practitioner may furnish and bill the service in a calendar month. 5. The patient may stop CCM services at any time (effective at the end of the month) by phone call to the office staff.   Patient agreed to services and verbal consent obtained.   Follow up plan:   Aggie Hacker  Upstream Scheduler

## 2020-11-21 DIAGNOSIS — L578 Other skin changes due to chronic exposure to nonionizing radiation: Secondary | ICD-10-CM | POA: Diagnosis not present

## 2020-11-21 DIAGNOSIS — L820 Inflamed seborrheic keratosis: Secondary | ICD-10-CM | POA: Diagnosis not present

## 2020-11-21 DIAGNOSIS — D485 Neoplasm of uncertain behavior of skin: Secondary | ICD-10-CM | POA: Diagnosis not present

## 2020-11-21 DIAGNOSIS — L821 Other seborrheic keratosis: Secondary | ICD-10-CM | POA: Diagnosis not present

## 2020-11-27 DIAGNOSIS — K219 Gastro-esophageal reflux disease without esophagitis: Secondary | ICD-10-CM | POA: Diagnosis not present

## 2020-11-27 DIAGNOSIS — Z800 Family history of malignant neoplasm of digestive organs: Secondary | ICD-10-CM | POA: Diagnosis not present

## 2020-12-01 DIAGNOSIS — Z20828 Contact with and (suspected) exposure to other viral communicable diseases: Secondary | ICD-10-CM | POA: Diagnosis not present

## 2020-12-01 DIAGNOSIS — Z1159 Encounter for screening for other viral diseases: Secondary | ICD-10-CM | POA: Diagnosis not present

## 2020-12-08 ENCOUNTER — Ambulatory Visit: Payer: PPO | Admitting: Family Medicine

## 2020-12-10 DIAGNOSIS — Z20828 Contact with and (suspected) exposure to other viral communicable diseases: Secondary | ICD-10-CM | POA: Diagnosis not present

## 2020-12-10 DIAGNOSIS — Z1159 Encounter for screening for other viral diseases: Secondary | ICD-10-CM | POA: Diagnosis not present

## 2020-12-15 DIAGNOSIS — K3184 Gastroparesis: Secondary | ICD-10-CM | POA: Diagnosis not present

## 2020-12-15 DIAGNOSIS — K5900 Constipation, unspecified: Secondary | ICD-10-CM | POA: Diagnosis not present

## 2020-12-15 DIAGNOSIS — K449 Diaphragmatic hernia without obstruction or gangrene: Secondary | ICD-10-CM | POA: Diagnosis not present

## 2020-12-15 DIAGNOSIS — K644 Residual hemorrhoidal skin tags: Secondary | ICD-10-CM | POA: Diagnosis not present

## 2020-12-15 DIAGNOSIS — I10 Essential (primary) hypertension: Secondary | ICD-10-CM | POA: Diagnosis not present

## 2020-12-15 DIAGNOSIS — E119 Type 2 diabetes mellitus without complications: Secondary | ICD-10-CM | POA: Diagnosis not present

## 2020-12-15 DIAGNOSIS — Z800 Family history of malignant neoplasm of digestive organs: Secondary | ICD-10-CM | POA: Diagnosis not present

## 2020-12-15 DIAGNOSIS — K317 Polyp of stomach and duodenum: Secondary | ICD-10-CM | POA: Diagnosis not present

## 2020-12-15 DIAGNOSIS — K5790 Diverticulosis of intestine, part unspecified, without perforation or abscess without bleeding: Secondary | ICD-10-CM | POA: Diagnosis not present

## 2020-12-15 DIAGNOSIS — K219 Gastro-esophageal reflux disease without esophagitis: Secondary | ICD-10-CM | POA: Diagnosis not present

## 2020-12-15 DIAGNOSIS — Z1211 Encounter for screening for malignant neoplasm of colon: Secondary | ICD-10-CM | POA: Diagnosis not present

## 2020-12-15 LAB — HM COLONOSCOPY

## 2020-12-19 DIAGNOSIS — I639 Cerebral infarction, unspecified: Secondary | ICD-10-CM | POA: Insufficient documentation

## 2021-01-01 ENCOUNTER — Encounter: Payer: Self-pay | Admitting: Gastroenterology

## 2021-01-03 DIAGNOSIS — Z1231 Encounter for screening mammogram for malignant neoplasm of breast: Secondary | ICD-10-CM | POA: Diagnosis not present

## 2021-01-04 ENCOUNTER — Telehealth: Payer: Self-pay

## 2021-01-04 DIAGNOSIS — H4322 Crystalline deposits in vitreous body, left eye: Secondary | ICD-10-CM | POA: Diagnosis not present

## 2021-01-04 DIAGNOSIS — E103553 Type 1 diabetes mellitus with stable proliferative diabetic retinopathy, bilateral: Secondary | ICD-10-CM | POA: Diagnosis not present

## 2021-01-04 NOTE — Progress Notes (Signed)
    Chronic Care Management Pharmacy Assistant   Name: [name]  MRN: [id number] DOB: June 10, 1951  Reason for Encounter: Initial questions for [name], CPP   PCP : Rochel Brome, MD  Allergies:  No Known Allergies  Medications: Outpatient Encounter Medications as of 01/04/2021  Medication Sig Note  . acyclovir (ZOVIRAX) 800 MG tablet TAKE 1 TABLET (800 MG) BY MOUTH 4 TIMES PER DAY FOR 3-7 DAYS AS NEEDED   . amLODipine (NORVASC) 10 MG tablet TAKE 1 TABLET BY MOUTH EVERY DAY   . aspirin EC 81 MG tablet Take 81 mg by mouth 2 (two) times a week.  07/30/2019: On hold per patient preference  . buPROPion (WELLBUTRIN XL) 150 MG 24 hr tablet TAKE 1 TABLET BY MOUTH EVERY DAY   . dicyclomine (BENTYL) 20 MG tablet Take 20 mg by mouth daily.  07/30/2019: On hold per patient preference  . insulin aspart (NOVOLOG) 100 UNIT/ML injection Inject 2-3 Units into the skin 3 (three) times daily before meals. Sliding scale   . insulin glargine (LANTUS) 100 UNIT/ML injection Inject 14 Units into the skin every evening. Reported on 02/02/2016   . levothyroxine (SYNTHROID, LEVOTHROID) 125 MCG tablet Take 125 mcg by mouth daily before breakfast.   . lisinopril-hydrochlorothiazide (ZESTORETIC) 10-12.5 MG tablet TAKE 1 TABLET BY MOUTH EVERY DAY   . LORazepam (ATIVAN) 0.5 MG tablet TAKE 1 TABLET (0.5 MG) BY MOUTH 1 TIMES PER DAY AS NEEDED FOR ANXIETY   . metoCLOPramide (REGLAN) 10 MG tablet Take 1 tablet (10 mg total) by mouth daily.   . Omega-3 Fatty Acids (FISH OIL PO) Take 1 capsule by mouth daily.   [name] omeprazole (PRILOSEC) 40 MG capsule TAKE 1 CAPSULE BY MOUTH EVERY DAY   . ONETOUCH ULTRA test strip SMARTSIG:6 Via Meter 6 Times Daily   . Probiotic Product (PROBIOTIC PO) Take 1 capsule by mouth daily.  (Patient not taking: Reported on 08/28/2020)   . rosuvastatin (CRESTOR) 10 MG tablet TAKE 1 TABLET BY MOUTH EVERY DAY   . venlafaxine XR (EFFEXOR-XR) 150 MG 24 hr capsule TAKE 1 CAPSULE BY MOUTH EVERY DAY   .  Vitamin D, Ergocalciferol, (DRISDOL) 1.25 MG (50000 UNIT) CAPS capsule TAKE 1 CAPSULE BY MOUTH EACH WEEK    No facility-administered encounter medications on file as of 01/04/2021.    Current Diagnosis: Patient Active Problem List   Diagnosis Date Noted  . Well adult exam 07/03/2020  . Left tibial fracture 08/03/2019  . Capsulitis of metatarsophalangeal (MTP) joint of left foot 01/22/2018  . Memory changes 07/04/2016  . Asthma 08/14/2015  . Laryngopharyngeal reflux (LPR) 08/14/2015  . Rhinitis 08/14/2015  . Acute respiratory failure with hypoxemia ([place]) 04/20/2014  . Encephalopathy acute 04/20/2014  . Primary localized osteoarthrosis, lower leg 04/18/2014  . Essential hypertension 04/15/2014  . Type 2 diabetes mellitus without complication ([place]) 22/12/5425   Unable to reach   Follow-Up:  Pharmacist Review  [name], CPP notified  Clarita Leber, [place] Pharmacist Assistant [phone]

## 2021-01-04 NOTE — Progress Notes (Deleted)
Chronic Care Management Pharmacy Note  01/04/2021 Name:  [name] MRN:  [id number] DOB:  1950/12/01  Subjective: [name] is an 70 y.o. year old female who is a primary patient of Cox, Kirsten, MD.  The CCM team was consulted for assistance with disease management and care coordination needs.    Engaged with patient by telephone for initial visit in response to provider referral for pharmacy case management and/or care coordination services.   Consent to Services:  The patient was given the following information about Chronic Care Management services today, agreed to services, and gave verbal consent: 1. CCM service includes personalized support from designated clinical staff supervised by the primary care provider, including individualized plan of care and coordination with other care providers 2. 24/7 contact phone numbers for assistance for urgent and routine care needs. 3. Service will only be billed when office clinical staff spend 20 minutes or more in a month to coordinate care. 4. Only one practitioner may furnish and bill the service in a calendar month. 5.The patient may stop CCM services at any time (effective at the end of the month) by phone call to the office staff. 6. The patient will be responsible for cost sharing (co-pay) of up to 20% of the service fee (after annual deductible is met). Patient agreed to services and consent obtained.  Patient Care Team: Rochel Brome, MD as PCP - General (Family Medicine) Cameron Sprang, MD as Consulting Physician (Neurology) Burnice Logan, [facility] as Pharmacist (Pharmacist)  Recent office visits: 09/01/2020 - increase Lantus 16 units daily. Referral to dermatology. Continue wellbutrin and effexor for depression.  07/31/2020 - flu shot given. Continue effexor xr 150 mg daily in the morning and add wellbutrin xl 150 mg daily am.   Recent consult visits: 11/27/2020 - GI - consider daily fiber supplementation. Continue miralax.   09/25/2020 - Endocrinology - continue lantus 13 units daily. Adjust novolog to 4 units before breakfast, 6 units before lunch and 3 units before supper. Continue correction dose of 1 unit per 50 mg/dl  Over 150. Does not want CGM or insulin pump. Continue thyroid medication.  08/28/2020 - neuro - follow-up as normal. MMSE 30/30.    Hospital visits: None in previous 6 months  Objective:  Lab Results  Component Value Date   CREATININE 0.85 09/01/2020   BUN 15 09/01/2020   GFRNONAA 70 09/01/2020   GFRAA 80 09/01/2020   NA 134 09/01/2020   K 4.3 09/01/2020   CALCIUM 10.0 09/01/2020   CO2 25 09/01/2020    Lab Results  Component Value Date/Time   HGBA1C 8.9 (H) 09/01/2020 12:20 PM   HGBA1C 7.6 (H) 08/03/2019 04:04 PM    Last diabetic Eye exam: No results found for: HMDIABEYEEXA  Last diabetic Foot exam: No results found for: HMDIABFOOTEX   No results found for: CHOL, HDL, LDLCALC, LDLDIRECT, TRIG, CHOLHDL  Hepatic Function Latest Ref Rng & Units 09/01/2020 08/03/2019  Total Protein 6.0 - 8.5 g/dL 7.0 6.5  Albumin 3.8 - 4.8 g/dL 4.5 3.8  AST 0 - 40 IU/L 17 19  ALT 0 - 32 IU/L 13 18  Alk Phosphatase 44 - 121 IU/L 114 153(H)  Total Bilirubin 0.0 - 1.2 mg/dL <0.2 0.3    Lab Results  Component Value Date/Time   TSH 0.779 09/01/2020 12:20 PM   TSH 3.571 08/03/2019 06:47 AM    CBC Latest Ref Rng & Units 09/01/2020 08/05/2019 08/04/2019  WBC 3.4 - 10.8 x10E3/uL 6.1 8.7 11.0(H)  Hemoglobin 11.1 - 15.9 g/dL 11.0(L) 9.9(L) 9.7(L)  Hematocrit 34.0 - 46.6 % 34.5 30.8(L) 28.1(L)  Platelets 150 - 450 x10E3/uL 405 300 295    Lab Results  Component Value Date/Time   VD25OH 49.7 08/03/2019 06:47 AM    Clinical ASCVD: No  The ASCVD Risk score Mikey Bussing DC Jr., et al., 2013) failed to calculate for the following reasons:   Cannot find a previous HDL lab   Cannot find a previous total cholesterol lab    Depression screen [facility] 2/9 09/01/2020 07/31/2020  Decreased Interest 1 3  Down,  Depressed, Hopeless 1 3  PHQ - 2 Score 2 6  Altered sleeping 1 1  Tired, decreased energy 0 2  Change in appetite 0 0  Feeling bad or failure about yourself  0 1  Trouble concentrating 0 2  Moving slowly or fidgety/restless 0 2  Suicidal thoughts 0 0  PHQ-9 Score 3 14  Difficult doing work/chores Not difficult at all Somewhat difficult     ***Other: (CHADS2VASc if Afib, MMRC or CAT for COPD, ACT, DEXA)  Social History   Tobacco Use  Smoking Status Never Smoker  Smokeless Tobacco Never Used   BP Readings from Last 3 Encounters:  09/01/20 130/60  08/28/20 131/62  07/31/20 128/62   Pulse Readings from Last 3 Encounters:  09/01/20 88  08/28/20 97  07/31/20 96   Wt Readings from Last 3 Encounters:  09/01/20 145 lb (65.8 kg)  08/28/20 146 lb 3.2 oz (66.3 kg)  07/31/20 146 lb (66.2 kg)    Assessment/Interventions: Review of patient past medical history, allergies, medications, health status, including review of consultants reports, laboratory and other test data, was performed as part of comprehensive evaluation and provision of chronic care management services.   SDOH:  (Social Determinants of Health) assessments and interventions performed: Yes   CCM Care Plan  No Known Allergies  Medications Reviewed Today    Reviewed by Rochel Brome, MD (Physician) on 09/01/20 at 1219  Med List Status: <None>  Medication Order Taking? Sig Documenting Provider Last Dose Status Informant  amLODipine (NORVASC) 10 MG tablet [id number] No TAKE 1 TABLET BY MOUTH EVERY DAY Cox, Kirsten, MD Taking Active   aspirin EC 81 MG tablet [id number] No Take 81 mg by mouth 2 (two) times a week.  [provider] Taking Active Self           Med Note Kenton Kingfisher, Earley Favor   Fri Jul 30, 2019  9:40 AM) On hold per patient preference  buPROPion (WELLBUTRIN XL) 150 MG 24 hr tablet [id number] No TAKE 1 TABLET BY MOUTH EVERY DAY Cox, Kirsten, MD Taking Active   dicyclomine (BENTYL) 20 MG tablet [id number]  No Take 20 mg by mouth daily.  [provider] Taking Active Self           Med Note Kenton Kingfisher, Earley Favor   Fri Jul 30, 2019  9:33 AM) On hold per patient preference  insulin aspart (NOVOLOG) 100 UNIT/ML injection [id number] No Inject 2-3 Units into the skin 3 (three) times daily before meals. Sliding scale [provider] Taking Active Self  insulin glargine (LANTUS) 100 UNIT/ML injection [id number] No Inject 14 Units into the skin every evening. Reported on 02/02/2016 [provider] Taking Active Self  levothyroxine (SYNTHROID, LEVOTHROID) 125 MCG tablet [id number] No Take 125 mcg by mouth daily before breakfast. [provider] Taking Active Self  lisinopril-hydrochlorothiazide (ZESTORETIC) 10-12.5 MG tablet [id number] No TAKE 1 TABLET BY MOUTH EVERY DAY  Cox, Kirsten, MD Taking Active   LORazepam (ATIVAN) 0.5 MG tablet [id number] No TAKE 1 TABLET (0.5 MG) BY MOUTH 1 TIMES PER DAY AS NEEDED FOR ANXIETY Cox, Kirsten, MD Taking Active   metoCLOPramide (REGLAN) 10 MG tablet [id number] No Take 1 tablet (10 mg total) by mouth daily. Jackquline Denmark, MD Taking Active Self  Omega-3 Fatty Acids (FISH OIL PO) [id number] No Take 1 capsule by mouth daily. [provider] Taking Active Self  omeprazole (PRILOSEC) 40 MG capsule [id number] No TAKE 1 CAPSULE BY MOUTH EVERY DAY Cox, Kirsten, MD Taking Active   [facility] ULTRA test strip [id number] No SMARTSIG:6 Via Meter 6 Times Daily [provider] Taking Active   Probiotic Product (PROBIOTIC PO) [id number] No Take 1 capsule by mouth daily.   Patient not taking: Reported on 08/28/2020   [provider] Not Taking Active Self  rosuvastatin (CRESTOR) 10 MG tablet [id number] No TAKE 1 TABLET BY MOUTH EVERY DAY Marge Duncans, PA-C Taking Active   venlafaxine XR (EFFEXOR-XR) 150 MG 24 hr capsule [id number] No TAKE 1 CAPSULE BY MOUTH EVERY DAY Cox, Kirsten, MD Taking Active   Vitamin D, Ergocalciferol, (DRISDOL) 1.25 MG (50000  UNIT) CAPS capsule [id number] No TAKE 1 CAPSULE BY MOUTH EACH WEEK Cox, Kirsten, MD Taking Active           Patient Active Problem List   Diagnosis Date Noted  . Well adult exam 07/03/2020  . Left tibial fracture 08/03/2019  . Capsulitis of metatarsophalangeal (MTP) joint of left foot 01/22/2018  . Memory changes 07/04/2016  . Asthma 08/14/2015  . Laryngopharyngeal reflux (LPR) 08/14/2015  . Rhinitis 08/14/2015  . Acute respiratory failure with hypoxemia ([place]) 04/20/2014  . Encephalopathy acute 04/20/2014  . Primary localized osteoarthrosis, lower leg 04/18/2014  . Essential hypertension 04/15/2014  . Type 2 diabetes mellitus without complication ([place]) 40/97/3532    Immunization History  Administered Date(s) Administered  . Fluad Quad(high Dose 65+) 07/31/2020  . Hepatitis B 05/01/2015, 11/28/2015  . Influenza, Quadrivalent, Recombinant, Inj, Pf 09/03/2019  . Influenza,inj,Quad PF,6+ Mos 07/23/2018  . Influenza-Unspecified 10/22/2004, 09/02/2012, 09/02/2013, 08/10/2014  . Moderna Sars-Covid-2 Vaccination 01/19/2020, 02/16/2020  . PFIZER(Purple Top)SARS-COV-2 Vaccination 09/11/2020  . PPD Test 07/03/2020  . Pneumococcal Conjugate-13 05/01/2015, 12/25/2018  . Pneumococcal Polysaccharide-23 07/03/2020  . Pneumococcal-Unspecified 07/03/2020  . Td 05/01/2015  . Tdap 05/01/2015, 12/25/2018  . Zoster Recombinat (Shingrix) 07/02/2020, 10/18/2020    Conditions to be addressed/monitored:  Hypertension, Diabetes, GERD, Asthma, Hypothyroidism, Depression and Osteoporosis  There are no care plans that you recently modified to display for this patient.    Medication Assistance: {MEDASSISTANCEINFO:25044}  Patient's preferred pharmacy is:  [id number] [place] [address] [place]Phone: [phone]: [phone] Uses pill box? {Yes or If no, why not?:20788} Pt endorses ***% compliance  We  discussed: {Pharmacy options:24294} [facility] Follow Up Patient Decision:  Patient agrees to Care Plan and Follow-up.  Plan: Telephone follow up appointment with care management team member scheduled for:  ***  ***    Current Barriers:  . {pharmacybarriers:24917} . ***  Pharmacist Clinical Goal(s):  .Marland KitchenOver the next 90 days, patient will {PHARMACYGOALCHOICES:24921} through collaboration with PharmD and provider.  . ***  Interventions: . 1:1 collaboration with CRochel Brome MD regarding development and update of comprehensive plan of care as evidenced by provider attestation and co-signature . Inter-disciplinary care team  collaboration (see longitudinal plan of care) . Comprehensive medication review performed; medication list updated in electronic medical record  Hypertension (BP goal <130/80) -{CHL Controlled/Uncontrolled:2295656239} -Current treatment: . Amlodipine 10 mg daily  . Lisinopril-hctz 10-12.5 mg daily  -Medications previously tried: ***  -Current home readings: *** -Current dietary habits: *** -Current exercise habits: *** -{ACTIONS;DENIES/REPORTS:21021675::"Denies"} hypotensive/hypertensive symptoms -Educated on {CCM BP Counseling:25124} -Counseled to monitor BP at home ***, document, and provide log at future appointments -{CCMPHARMDINTERVENTION:25122}  Hyperlipidemia: (LDL goal < ***) -{CHL Controlled/Uncontrolled:2295656239} -Current treatment: . ***aspirin ec 81 mg twice a week  . Rosuvastatin 10 mg daily . Omega 3 fish oil daily   -Medications previously tried: *** simvastatin  -Current dietary patterns: *** -Current exercise habits: *** -Educated on {CCM HLD Counseling:25126} -{CCMPHARMDINTERVENTION:25122}  Diabetes (A1c goal {A1c goals:23924}) -{CHL Controlled/Uncontrolled:2295656239} -Current medications: . ***novolog three times daily before meals sliding scale . Lantus 15 units every  evening -Medications previously tried: ***  -Current home glucose readings . fasting glucose: *** . post prandial glucose: *** -{ACTIONS;DENIES/REPORTS:21021675::"Denies"} hypoglycemic/hyperglycemic symptoms -Current meal patterns:  . breakfast: ***  . lunch: ***  . dinner: *** . snacks: *** . drinks: *** -Current exercise: *** -Educated on{CCM DM COUNSELING:25123} -Counseled to check feet daily and get yearly eye exams -{CCMPHARMDINTERVENTION:25122}  Depression/Anxiety (Goal: ***) -{CHL Controlled/Uncontrolled:2295656239} -Current treatment: . ***bupropion xl 150 mg daily  . Lorazepam 0.5 mg daily prn anxiety . Venlafaxine xr 150 mg daily  -Medications previously tried/failed: ***trazodone -PHQ9: *** -GAD7: *** -Connected with *** for mental health support -Educated on {CCM mental health counseling:25127} -{CCMPHARMDINTERVENTION:25122}  Osteoporosis / Osteopenia (Goal ***) -{CHL Controlled/Uncontrolled:2295656239} -Last DEXA Scan: ***   T-Score femoral neck: ***  T-Score total hip: ***  T-Score lumbar spine: ***  T-Score forearm radius: ***  10-year probability of major osteoporotic fracture: ***  10-year probability of hip fracture: *** -Patient {is;is not an osteoporosis candidate:23886} -Current treatment  . ***vitamin d 1.25 mg weekly  -Medications previously tried: *** ibandronate -{Osteoporosis Counseling:23892} -{CCMPHARMDINTERVENTION:25122}   *** (Goal: ***) -{CHL Controlled/Uncontrolled:2295656239} -Current treatment  . ***dicyclomine 20 mg daily  . Metoclopramide 10 mg daily  . Omeprazole 40 mg daily  . Probiotic daily  -Medications previously tried: *** ranitidine, docusate -{CCMPHARMDINTERVENTION:25122}    Hypothyroidism (Goal: ***) -{CHL Controlled/Uncontrolled:2295656239} -Current treatment  . ***levothyroxine 125 mcg daily before breakfast -Medications previously tried: ***  -{CCMPHARMDINTERVENTION:25122}  Health Maintenance -Vaccine  gaps: *** -Current therapy:  . ***acyclovir 800 mg qid for 3-7 prn .  -Educated on {ccm supplement counseling:25128} -{CCM Patient satisfied:25129} -{CCMPHARMDINTERVENTION:25122}   Patient Goals/Self-Care Activities . Over the next *** days, patient will:  - {pharmacypatientgoals:24919}  Follow Up Plan: {CM FOLLOW UP KJZP:91505}

## 2021-01-05 ENCOUNTER — Telehealth: Payer: PPO

## 2021-01-08 ENCOUNTER — Encounter: Payer: Self-pay | Admitting: Nurse Practitioner

## 2021-01-08 ENCOUNTER — Other Ambulatory Visit: Payer: Self-pay | Admitting: Physician Assistant

## 2021-01-08 ENCOUNTER — Telehealth: Payer: Self-pay

## 2021-01-08 ENCOUNTER — Ambulatory Visit (INDEPENDENT_AMBULATORY_CARE_PROVIDER_SITE_OTHER): Payer: PPO | Admitting: Nurse Practitioner

## 2021-01-08 ENCOUNTER — Other Ambulatory Visit: Payer: Self-pay

## 2021-01-08 VITALS — BP 106/60 | HR 90 | Temp 97.1°F | Resp 16 | Ht 62.0 in | Wt 145.0 lb

## 2021-01-08 DIAGNOSIS — J0110 Acute frontal sinusitis, unspecified: Secondary | ICD-10-CM

## 2021-01-08 DIAGNOSIS — R519 Headache, unspecified: Secondary | ICD-10-CM | POA: Diagnosis not present

## 2021-01-08 DIAGNOSIS — D508 Other iron deficiency anemias: Secondary | ICD-10-CM | POA: Diagnosis not present

## 2021-01-08 DIAGNOSIS — R928 Other abnormal and inconclusive findings on diagnostic imaging of breast: Secondary | ICD-10-CM

## 2021-01-08 DIAGNOSIS — R110 Nausea: Secondary | ICD-10-CM

## 2021-01-08 DIAGNOSIS — R350 Frequency of micturition: Secondary | ICD-10-CM

## 2021-01-08 LAB — POC COVID19 BINAXNOW: SARS Coronavirus 2 Ag: NEGATIVE

## 2021-01-08 MED ORDER — AZITHROMYCIN 250 MG PO TABS: ORAL_TABLET | ORAL | 0 refills | Status: DC

## 2021-01-08 NOTE — Telephone Encounter (Signed)
Pt called and left message. Message states she would like an appointment due to "chronic headache and nausea" states it has been going on a couple weeks.   Called pt. Pt states she had a colonoscopy a couple weeks ago. States she has had a headache and nausea since. States she has been drinking 6 glasses of water a day. Denies abdominal pain.   Denies covid symptoms. Did take home covid test about week ago and it was negative.   Scheduled an appointment for today.   Royce Macadamia, [place] 01/08/21 11:21 AM

## 2021-01-08 NOTE — Progress Notes (Signed)
Acute Office Visit  Subjective:    Patient ID: [name], female    DOB: 01/16/1951, 70 y.o.   MRN: [id number]  Chief Complaint  Patient presents with  . Nausea    Patient has 2 weeks with nausea  . Headache    HPI Patient is in today for headache and nausea for 2-weeks. She states she had an EGD/Colonoscopy with Dr Lyda Jester on 12/15/20. Gastric polypectomy performed, internal She states she developed a headache and nausea after completing colon prep for procedure. Treatments include Tylenol Ibuprofen, and pushing fluids. She denies rectal bleeding, vomiting, or fever.   Past Medical History:  Diagnosis Date  . Depression   . Diabetes mellitus without complication ([place])   . GERD (gastroesophageal reflux disease)   . Hypertension   . Hypothyroidism   . Type 1 diabetes [facility])     Past Surgical History:  Procedure Laterality Date  . APPENDECTOMY    . APPLICATION OF WOUND VAC Left 08/03/2019   Procedure: Application Of Wound Vac;  Surgeon: Altamese Brookford, MD;  Location: [place];  Service: Orthopedics;  Laterality: Left;  . CESAREAN SECTION    . ESOPHAGOGASTRODUODENOSCOPY (EGD) WITH PROPOFOL N/A 02/02/2016   Procedure: ESOPHAGOGASTRODUODENOSCOPY (EGD) WITH PROPOFOL;  Surgeon: Josefine Class, MD;  [facility] ENDOSCOPY;  Service: Endoscopy;  Laterality: N/A;  . HAND SURGERY    . JOINT REPLACEMENT Bilateral    Knee   . ORIF ANKLE FRACTURE Left 08/03/2019   with wound vac applied   . ORIF ANKLE FRACTURE Left 08/03/2019   Procedure: OPEN REDUCTION INTERNAL FIXATION (ORIF) ANKLE FRACTURE;  Surgeon: Altamese Essex, MD;  Location: [place];  Service: Orthopedics;  Laterality: Left;  . TIBIALIS TENDON TRANSFER / REPAIR      Family History  Problem Relation Age of Onset  . Cancer Mother        Colon cancer   . Dementia Father   . Neuropathy Father     Social History   Socioeconomic History  . Marital status: Single    Spouse name: Not on file  . Number of  children: Not on file  . Years of education: Not on file  . Highest education level: Not on file  Occupational History  . Not on file  Tobacco Use  . Smoking status: Never Smoker  . Smokeless tobacco: Never Used  Vaping Use  . Vaping Use: Never used  Substance and Sexual Activity  . Alcohol use: Yes    Alcohol/week: 1.0 standard drink    Types: 1 Glasses of wine per week    Comment: everyday  . Drug use: Never  . Sexual activity: Not Currently  Other Topics Concern  . Not on file  Social History Narrative   Right hand   Lives alone   Social Determinants of Health   Financial Resource Strain: Not on file  Food Insecurity: Not on file  Transportation Needs: Not on file  Physical Activity: Not on file  Stress: Not on file  Social Connections: Not on file  Intimate Partner Violence: Not on file    Outpatient Medications Prior to Visit  Medication Sig Dispense Refill  . acyclovir (ZOVIRAX) 800 MG tablet TAKE 1 TABLET (800 MG) BY MOUTH 4 TIMES PER DAY FOR 3-7 DAYS AS NEEDED 30 tablet 2  . amLODipine (NORVASC) 10 MG tablet TAKE 1 TABLET BY MOUTH EVERY DAY 90 tablet 3  . aspirin EC 81 MG tablet Take 81 mg by mouth 2 (two) times a week.     [name]  buPROPion (WELLBUTRIN XL) 150 MG 24 hr tablet TAKE 1 TABLET BY MOUTH EVERY DAY 90 tablet 0  . dicyclomine (BENTYL) 20 MG tablet Take 20 mg by mouth daily.     . insulin aspart (NOVOLOG) 100 UNIT/ML injection Inject 2-3 Units into the skin 3 (three) times daily before meals. Sliding scale    . insulin glargine (LANTUS) 100 UNIT/ML injection Inject 14 Units into the skin every evening. Reported on 02/02/2016    . levothyroxine (SYNTHROID, LEVOTHROID) 125 MCG tablet Take 125 mcg by mouth daily before breakfast.    . lisinopril-hydrochlorothiazide (ZESTORETIC) 10-12.5 MG tablet TAKE 1 TABLET BY MOUTH EVERY DAY 90 tablet 1  . LORazepam (ATIVAN) 0.5 MG tablet TAKE 1 TABLET (0.5 MG) BY MOUTH 1 TIMES PER DAY AS NEEDED FOR ANXIETY 30 tablet 2  .  metoCLOPramide (REGLAN) 10 MG tablet Take 1 tablet (10 mg total) by mouth daily. 30 tablet 0  . Omega-3 Fatty Acids (FISH OIL PO) Take 1 capsule by mouth daily.    [name] omeprazole (PRILOSEC) 40 MG capsule TAKE 1 CAPSULE BY MOUTH EVERY DAY 90 capsule 3  . ONETOUCH ULTRA test strip SMARTSIG:6 Via Meter 6 Times Daily    . rosuvastatin (CRESTOR) 10 MG tablet TAKE 1 TABLET BY MOUTH EVERY DAY 90 tablet 1  . venlafaxine XR (EFFEXOR-XR) 150 MG 24 hr capsule TAKE 1 CAPSULE BY MOUTH EVERY DAY 90 capsule 2  . Vitamin D, Ergocalciferol, (DRISDOL) 1.25 MG (50000 UNIT) CAPS capsule TAKE 1 CAPSULE BY MOUTH EACH WEEK 12 capsule 3  . Probiotic Product (PROBIOTIC PO) Take 1 capsule by mouth daily.  (Patient not taking: Reported on 08/28/2020)     No facility-administered medications prior to visit.    No Known Allergies  Review of Systems  Constitutional: Positive for fatigue. Negative for appetite change.  HENT: Positive for congestion, postnasal drip, sinus pressure and sinus pain.   Respiratory: Negative for cough and shortness of breath.   Cardiovascular: Negative for chest pain and palpitations.  Gastrointestinal: Positive for nausea and vomiting. Negative for abdominal pain.  Endocrine: Positive for polyuria. Negative for cold intolerance, heat intolerance, polydipsia and polyphagia.  Genitourinary: Negative for dysuria, hematuria and urgency.  Musculoskeletal: Negative for arthralgias and myalgias.  Skin: Negative.   Neurological: Positive for dizziness and headaches.  Hematological: Negative for adenopathy.       Objective:    Physical Exam Vitals reviewed.  Constitutional:      Appearance: She is well-developed.  HENT:     Head: Normocephalic.     Right Ear: Tympanic membrane normal.     Left Ear: There is impacted cerumen.     Nose: Nasal tenderness and rhinorrhea present. Rhinorrhea is clear.     Right Turbinates: Swollen.     Left Turbinates: Swollen.     Right Sinus: Frontal sinus  tenderness present.     Left Sinus: Frontal sinus tenderness present.     Mouth/Throat:     Mouth: Mucous membranes are moist.  Cardiovascular:     Rate and Rhythm: Normal rate and regular rhythm.     Heart sounds: Normal heart sounds.  Pulmonary:     Effort: Pulmonary effort is normal.     Breath sounds: Normal breath sounds.  Abdominal:     General: Bowel sounds are normal.     Palpations: Abdomen is soft.  Skin:    General: Skin is warm and dry.     Capillary Refill: Capillary refill takes less than 2 seconds.  Neurological:     Mental Status: She is alert and oriented to person, place, and time.  Psychiatric:        Mood and Affect: Mood normal.        Behavior: Behavior normal.     BP 106/60   Pulse 90   Temp (!) 97.1 F (36.2 C)   Resp 16   Ht 5\' 2"  (1.575 m)   Wt 145 lb (65.8 kg)   SpO2 97%   BMI 26.52 kg/m  Wt Readings from Last 3 Encounters:  01/08/21 145 lb (65.8 kg)  09/01/20 145 lb (65.8 kg)  08/28/20 146 lb 3.2 oz (66.3 kg)    Health Maintenance Due      Lab Results  Component Value Date   TSH 0.779 09/01/2020   Lab Results  Component Value Date   WBC 6.1 09/01/2020   HGB 11.0 (L) 09/01/2020   HCT 34.5 09/01/2020   MCV 85 09/01/2020   PLT 405 09/01/2020   Lab Results  Component Value Date   NA 134 09/01/2020   K 4.3 09/01/2020   CO2 25 09/01/2020   GLUCOSE 63 (L) 09/01/2020   BUN 15 09/01/2020   CREATININE 0.85 09/01/2020   BILITOT <0.2 09/01/2020   ALKPHOS 114 09/01/2020   AST 17 09/01/2020   ALT 13 09/01/2020   PROT 7.0 09/01/2020   ALBUMIN 4.5 09/01/2020   CALCIUM 10.0 09/01/2020   ANIONGAP 9 08/05/2019    Lab Results  Component Value Date   HGBA1C 8.9 (H) 09/01/2020       Assessment & Plan:  1. Acute frontal sinusitis, recurrence not specified - azithromycin (ZITHROMAX) 250 MG tablet; Take two tablets by mouth on day one, take one tablet by mouth days two-five  Dispense: 6 tablet; Refill: 0  2. Nausea - POC  COVID-19  3. Nonintractable headache, unspecified chronicity pattern, unspecified headache type - POC COVID-19 - CBC with Differential/Platelet - Comprehensive metabolic panel      Begin Z-pak today, take two tablets by mouth on day one, take one tablet by mouth days two-five Rest and push fluids Notify office if symptoms fail to improve  Debrox to left ear for ear wax impaction Follow-up as needed, pending labs   Orders Placed This Encounter  Procedures  . POC COVID-19     Follow-up: PRN, Pending labs  An After Visit Summary was printed and given to the patient.  Rip Harbour, NP [place] [phone]

## 2021-01-08 NOTE — Patient Instructions (Addendum)
Begin Z-pak today, take two tablets by mouth on day one, take one tablet by mouth days two-five Rest and push fluids Notify office if symptoms fail to improve  Debrox to left ear for ear wax impaction Follow-up as needed, pending labs  Sinusitis, Adult Sinusitis is soreness and swelling (inflammation) of your sinuses. Sinuses are hollow spaces in the bones around your face. They are located:  Around your eyes.  In the middle of your forehead.  Behind your nose.  In your cheekbones. Your sinuses and nasal passages are lined with a fluid called mucus. Mucus drains out of your sinuses. Swelling can trap mucus in your sinuses. This lets germs (bacteria, virus, or fungus) grow, which leads to infection. Most of the time, this condition is caused by a virus. What are the causes? This condition is caused by:  Allergies.  Asthma.  Germs.  Things that block your nose or sinuses.  Growths in the nose (nasal polyps).  Chemicals or irritants in the air.  Fungus (rare). What increases the risk? You are more likely to develop this condition if:  You have a weak body defense system (immune system).  You do a lot of swimming or diving.  You use nasal sprays too much.  You smoke. What are the signs or symptoms? The main symptoms of this condition are pain and a feeling of pressure around the sinuses. Other symptoms include:  Stuffy nose (congestion).  Runny nose (drainage).  Swelling and warmth in the sinuses.  Headache.  Toothache.  A cough that may get worse at night.  Mucus that collects in the throat or the back of the nose (postnasal drip).  Being unable to smell and taste.  Being very tired (fatigue).  A fever.  Sore throat.  Bad breath. How is this diagnosed? This condition is diagnosed based on:  Your symptoms.  Your medical history.  A physical exam.  Tests to find out if your condition is short-term (acute) or long-term (chronic). Your doctor  may: ? Check your nose for growths (polyps). ? Check your sinuses using a tool that has a light (endoscope). ? Check for allergies or germs. ? Do imaging tests, such as an MRI or CT scan. How is this treated? Treatment for this condition depends on the cause and whether it is short-term or long-term.  If caused by a virus, your symptoms should go away on their own within 10 days. You may be given medicines to relieve symptoms. They include: ? Medicines that shrink swollen tissue in the nose. ? Medicines that treat allergies (antihistamines). ? A spray that treats swelling of the nostrils. ? Rinses that help get rid of thick mucus in your nose (nasal saline washes).  If caused by bacteria, your doctor may wait to see if you will get better without treatment. You may be given antibiotic medicine if you have: ? A very bad infection. ? A weak body defense system.  If caused by growths in the nose, you may need to have surgery. Follow these instructions at home: Medicines  Take, use, or apply over-the-counter and prescription medicines only as told by your doctor. These may include nasal sprays.  If you were prescribed an antibiotic medicine, take it as told by your doctor. Do not stop taking the antibiotic even if you start to feel better. Hydrate and humidify  Drink enough water to keep your pee (urine) pale yellow.  Use a cool mist humidifier to keep the humidity level in your home  above 50%.  Breathe in steam for 10-15 minutes, 3-4 times a day, or as told by your doctor. You can do this in the bathroom while a hot shower is running.  Try not to spend time in cool or dry air.   Rest  Rest as much as you can.  Sleep with your head raised (elevated).  Make sure you get enough sleep each night. General instructions  Put a warm, moist washcloth on your face 3-4 times a day, or as often as told by your doctor. This will help with discomfort.  Wash your hands often with soap and  water. If there is no soap and water, use hand sanitizer.  Do not smoke. Avoid being around people who are smoking (secondhand smoke).  Keep all follow-up visits as told by your doctor. This is important.   Contact a doctor if:  You have a fever.  Your symptoms get worse.  Your symptoms do not get better within 10 days. Get help right away if:  You have a very bad headache.  You cannot stop throwing up (vomiting).  You have very bad pain or swelling around your face or eyes.  You have trouble seeing.  You feel confused.  Your neck is stiff.  You have trouble breathing. Summary  Sinusitis is swelling of your sinuses. Sinuses are hollow spaces in the bones around your face.  This condition is caused by tissues in your nose that become inflamed or swollen. This traps germs. These can lead to infection.  If you were prescribed an antibiotic medicine, take it as told by your doctor. Do not stop taking it even if you start to feel better.  Keep all follow-up visits as told by your doctor. This is important. This information is not intended to replace advice given to you by your health care provider. Make sure you discuss any questions you have with your health care provider. Document Revised: 04/06/2018 Document Reviewed: 04/06/2018 Elsevier Patient Education  2021 [place]. Azithromycin tablets What is this medicine? AZITHROMYCIN (az ith roe MYE sin) is a macrolide antibiotic. It is used to treat or prevent certain kinds of bacterial infections. It will not work for colds, flu, or other viral infections. This medicine may be used for other purposes; ask your health care provider or pharmacist if you have questions. COMMON BRAND NAME(S): Zithromax, Zithromax Tri-Pak, Zithromax Z-Pak What should I tell my health care provider before I take this medicine? They need to know if you have any of these conditions:  history of blood diseases, like leukemia  history of  irregular heartbeat  kidney disease  liver disease  myasthenia gravis  an unusual or allergic reaction to azithromycin, erythromycin, other macrolide antibiotics, foods, dyes, or preservatives  pregnant or trying to get pregnant  breast-feeding How should I use this medicine? Take this medicine by mouth with a full glass of water. Follow the directions on the prescription label. The tablets can be taken with food or on an empty stomach. If the medicine upsets your stomach, take it with food. Take your medicine at regular intervals. Do not take your medicine more often than directed. Take all of your medicine as directed even if you think your are better. Do not skip doses or stop your medicine early. Talk to your pediatrician regarding the use of this medicine in children. While this drug may be prescribed for children as young as 6 months for selected conditions, precautions do apply. Overdosage: If you think you  have taken too much of this medicine contact a poison control center or emergency room at once. NOTE: This medicine is only for you. Do not share this medicine with others. What if I miss a dose? If you miss a dose, take it as soon as you can. If it is almost time for your next dose, take only that dose. Do not take double or extra doses. What may interact with this medicine? Do not take this medicine with any of the following medications:  cisapride  dronedarone  pimozide  thioridazine This medicine may also interact with the following medications:  antacids that contain aluminum or magnesium  birth control pills  colchicine  cyclosporine  digoxin  ergot alkaloids like dihydroergotamine, ergotamine  nelfinavir  other medicines that prolong the QT interval (an abnormal heart rhythm)  phenytoin  warfarin This list may not describe all possible interactions. Give your health care provider a list of all the medicines, herbs, non-prescription drugs, or dietary  supplements you use. Also tell them if you smoke, drink alcohol, or use illegal drugs. Some items may interact with your medicine. What should I watch for while using this medicine? Tell your doctor or healthcare provider if your symptoms do not start to get better or if they get worse. This medicine may cause serious skin reactions. They can happen weeks to months after starting the medicine. Contact your healthcare provider right away if you notice fevers or flu-like symptoms with a rash. The rash may be red or purple and then turn into blisters or peeling of the skin. Or, you might notice a red rash with swelling of the face, lips or lymph nodes in your neck or under your arms. Do not treat diarrhea with over the counter products. Contact your doctor if you have diarrhea that lasts more than 2 days or if it is severe and watery. This medicine can make you more sensitive to the sun. Keep out of the sun. If you cannot avoid being in the sun, wear protective clothing and use sunscreen. Do not use sun lamps or tanning beds/booths. What side effects may I notice from receiving this medicine? Side effects that you should report to your doctor or health care professional as soon as possible:  allergic reactions like skin rash, itching or hives, swelling of the face, lips, or tongue  bloody or watery diarrhea  breathing problems  chest pain  fast, irregular heartbeat  muscle weakness  rash, fever, and swollen lymph nodes  redness, blistering, peeling, or loosening of the skin, including inside the mouth  signs and symptoms of liver injury like dark yellow or brown urine; general ill feeling or flu-like symptoms; light-colored stools; loss of appetite; nausea; right upper belly pain; unusually weak or tired; yellowing of the eyes or skin  white patches or sores in the mouth  unusually weak or tired Side effects that usually do not require medical attention (report to your doctor or health  care professional if they continue or are bothersome):  diarrhea  nausea  stomach pain  vomiting This list may not describe all possible side effects. Call your doctor for medical advice about side effects. You may report side effects to FDA at 1-800-FDA-1088. Where should I keep my medicine? Keep out of the reach of children. Store at room temperature between 15 and 30 degrees C (59 and 86 degrees F). Throw away any unused medicine after the expiration date. NOTE: This sheet is a summary. It may not cover all  possible information. If you have questions about this medicine, talk to your doctor, pharmacist, or health care provider.  2021 Elsevier/Gold Standard (2019-02-11 17:19:20)  Earwax Buildup, Adult The ears produce a substance called earwax that helps keep bacteria out of the ear and protects the skin in the ear canal. Occasionally, earwax can build up in the ear and cause discomfort or hearing loss. What are the causes? This condition is caused by a buildup of earwax. Ear canals are self-cleaning. Ear wax is made in the outer part of the ear canal and generally falls out in small amounts over time. When the self-cleaning mechanism is not working, earwax builds up and can cause decreased hearing and discomfort. Attempting to clean ears with cotton swabs can push the earwax deep into the ear canal and cause decreased hearing and pain. What increases the risk? This condition is more likely to develop in people who:  Clean their ears often with cotton swabs.  Pick at their ears.  Use earplugs or in-ear headphones often, or wear hearing aids. The following factors may also make you more likely to develop this condition:  Being female.  Being of older age.  Naturally producing more earwax.  Having narrow ear canals.  Having earwax that is overly thick or sticky.  Having excess hair in the ear canal.  Having eczema.  Being dehydrated. What are the signs or  symptoms? Symptoms of this condition include:  Reduced or muffled hearing.  A feeling of fullness in the ear or feeling that the ear is plugged.  Fluid coming from the ear.  Ear pain or an itchy ear.  Ringing in the ear.  Coughing.  Balance problems.  An obvious piece of earwax that can be seen inside the ear canal. How is this diagnosed? This condition may be diagnosed based on:  Your symptoms.  Your medical history.  An ear exam. During the exam, your health care provider will look into your ear with an instrument called an otoscope. You may have tests, including a hearing test. How is this treated? This condition may be treated by:  Using ear drops to soften the earwax.  Having the earwax removed by a health care provider. The health care provider may: ? Flush the ear with water. ? Use an instrument that has a loop on the end (curette). ? Use a suction device.  Having surgery to remove the wax buildup. This may be done in severe cases. Follow these instructions at home:  Take over-the-counter and prescription medicines only as told by your health care provider.  Do not put any objects, including cotton swabs, into your ear. You can clean the opening of your ear canal with a washcloth or facial tissue.  Follow instructions from your health care provider about cleaning your ears. Do not overclean your ears.  Drink enough fluid to keep your urine pale yellow. This will help to thin the earwax.  Keep all follow-up visits as told. If earwax builds up in your ears often or if you use hearing aids, consider seeing your health care provider for routine, preventive ear cleanings. Ask your health care provider how often you should schedule your cleanings.  If you have hearing aids, clean them according to instructions from the manufacturer and your health care provider.   Contact a health care provider if:  You have ear pain.  You develop a fever.  You have pus or  other fluid coming from your ear.  You have hearing loss.  You have ringing in your ears that does not go away.  You feel like the room is spinning (vertigo).  Your symptoms do not improve with treatment. Get help right away if:  You have bleeding from the affected ear.  You have severe ear pain. Summary  Earwax can build up in the ear and cause discomfort or hearing loss.  The most common symptoms of this condition include reduced or muffled hearing, a feeling of fullness in the ear, or feeling that the ear is plugged.  This condition may be diagnosed based on your symptoms, your medical history, and an ear exam.  This condition may be treated by using ear drops to soften the earwax or by having the earwax removed by a health care provider.  Do not put any objects, including cotton swabs, into your ear. You can clean the opening of your ear canal with a washcloth or facial tissue. This information is not intended to replace advice given to you by your health care provider. Make sure you discuss any questions you have with your health care provider. Document Revised: 02/22/2020 Document Reviewed: 02/22/2020 Elsevier Patient Education  2021 [place]. Carbamide Peroxide ear solution What is this medicine? CARBAMIDE PEROXIDE (CAR bah mide per OX ide) is used to soften and help remove ear wax. This medicine may be used for other purposes; ask your health care provider or pharmacist if you have questions. COMMON BRAND NAME(S): Auro Ear, Auro Earache Relief, Debrox, Ear Drops, Ear Wax Removal, Ear Wax Remover, Earwax Treatment, Murine, Thera-Ear What should I tell my health care provider before I take this medicine? They need to know if you have any of these conditions:  dizziness  ear discharge  ear pain, irritation or rash  infection  perforated eardrum (hole in eardrum)  an unusual or allergic reaction to carbamide peroxide, glycerin, hydrogen peroxide, other  medicines, foods, dyes, or preservatives  pregnant or trying to get pregnant  breast-feeding How should I use this medicine? This medicine is only for use in the outer ear canal. Follow the directions carefully. Wash hands before and after use. The solution may be warmed by holding the bottle in the hand for 1 to 2 minutes. Lie with the affected ear facing upward. Place the proper number of drops into the ear canal. After the drops are instilled, remain lying with the affected ear upward for 5 minutes to help the drops stay in the ear canal. A cotton ball may be gently inserted at the ear opening for no longer than 5 to 10 minutes to ensure retention. Repeat, if necessary, for the opposite ear. Do not touch the tip of the dropper to the ear, fingertips, or other surface. Do not rinse the dropper after use. Keep container tightly closed. Talk to your pediatrician regarding the use of this medicine in children. While this drug may be used in children as young as 12 years for selected conditions, precautions do apply. Overdosage: If you think you have taken too much of this medicine contact a poison control center or emergency room at once. NOTE: This medicine is only for you. Do not share this medicine with others. What if I miss a dose? If you miss a dose, use it as soon as you can. If it is almost time for your next dose, use only that dose. Do not use double or extra doses. What may interact with this medicine? Interactions are not expected. Do not use any other ear products without asking  your doctor or health care professional. This list may not describe all possible interactions. Give your health care provider a list of all the medicines, herbs, non-prescription drugs, or dietary supplements you use. Also tell them if you smoke, drink alcohol, or use illegal drugs. Some items may interact with your medicine. What should I watch for while using this medicine? This medicine is not for long-term  use. Do not use for more than 4 days without checking with your health care professional. Contact your doctor or health care professional if your condition does not start to get better within a few days or if you notice burning, redness, itching or swelling. What side effects may I notice from receiving this medicine? Side effects that you should report to your doctor or health care professional as soon as possible:  allergic reactions like skin rash, itching or hives, swelling of the face, lips, or tongue  burning, itching, and redness  worsening ear pain  rash Side effects that usually do not require medical attention (report to your doctor or health care professional if they continue or are bothersome):  abnormal sensation while putting the drops in the ear  temporary reduction in hearing (but not complete loss of hearing) This list may not describe all possible side effects. Call your doctor for medical advice about side effects. You may report side effects to FDA at 1-800-FDA-1088. Where should I keep my medicine? Keep out of the reach of children. Store at room temperature between 15 and 30 degrees C (59 and 86 degrees F) in a tight, light-resistant container. Keep bottle away from excessive heat and direct sunlight. Throw away any unused medicine after the expiration date. NOTE: This sheet is a summary. It may not cover all possible information. If you have questions about this medicine, talk to your doctor, pharmacist, or health care provider.  2021 Elsevier/Gold Standard (2008-02-16 14:00:02)

## 2021-01-09 DIAGNOSIS — R42 Dizziness and giddiness: Secondary | ICD-10-CM | POA: Diagnosis not present

## 2021-01-09 DIAGNOSIS — R519 Headache, unspecified: Secondary | ICD-10-CM | POA: Diagnosis not present

## 2021-01-09 DIAGNOSIS — R300 Dysuria: Secondary | ICD-10-CM | POA: Diagnosis not present

## 2021-01-09 DIAGNOSIS — R110 Nausea: Secondary | ICD-10-CM | POA: Diagnosis not present

## 2021-01-09 LAB — CBC WITH DIFFERENTIAL/PLATELET
Basophils Absolute: 0.1 10*3/uL (ref 0.0–0.2)
Basos: 2 %
EOS (ABSOLUTE): 0.3 10*3/uL (ref 0.0–0.4)
Eos: 5 %
Hematocrit: 33.2 % — ABNORMAL LOW (ref 34.0–46.6)
Hemoglobin: 10.6 g/dL — ABNORMAL LOW (ref 11.1–15.9)
Immature Grans (Abs): 0 10*3/uL (ref 0.0–0.1)
Immature Granulocytes: 0 %
Lymphocytes Absolute: 1.4 10*3/uL (ref 0.7–3.1)
Lymphs: 24 %
MCH: 26.6 pg (ref 26.6–33.0)
MCHC: 31.9 g/dL (ref 31.5–35.7)
MCV: 83 fL (ref 79–97)
Monocytes Absolute: 0.6 10*3/uL (ref 0.1–0.9)
Monocytes: 11 %
Neutrophils Absolute: 3.5 10*3/uL (ref 1.4–7.0)
Neutrophils: 58 %
Platelets: 405 10*3/uL (ref 150–450)
RBC: 3.99 x10E6/uL (ref 3.77–5.28)
RDW: 14.1 % (ref 11.7–15.4)
WBC: 5.9 10*3/uL (ref 3.4–10.8)

## 2021-01-09 LAB — COMPREHENSIVE METABOLIC PANEL
ALT: 13 IU/L (ref 0–32)
AST: 18 IU/L (ref 0–40)
Albumin/Globulin Ratio: 1.8 (ref 1.2–2.2)
Albumin: 4.5 g/dL (ref 3.7–4.7)
Alkaline Phosphatase: 109 IU/L (ref 44–121)
BUN/Creatinine Ratio: 17 (ref 12–28)
BUN: 16 mg/dL (ref 8–27)
Bilirubin Total: 0.2 mg/dL (ref 0.0–1.2)
CO2: 22 mmol/L (ref 20–29)
Calcium: 9.4 mg/dL (ref 8.7–10.3)
Chloride: 94 mmol/L — ABNORMAL LOW (ref 96–106)
Creatinine, Ser: 0.96 mg/dL (ref 0.57–1.00)
GFR calc Af Amer: 69 mL/min/{1.73_m2} (ref >59)
GFR calc non Af Amer: 60 mL/min/{1.73_m2} (ref >59)
Globulin, Total: 2.5 g/dL (ref 1.5–4.5)
Glucose: 213 mg/dL — ABNORMAL HIGH (ref 65–99)
Potassium: 4.2 mmol/L (ref 3.5–5.2)
Sodium: 131 mmol/L — ABNORMAL LOW (ref 134–144)
Total Protein: 7 g/dL (ref 6.0–8.5)

## 2021-01-10 DIAGNOSIS — I6389 Other cerebral infarction: Secondary | ICD-10-CM | POA: Diagnosis not present

## 2021-01-10 DIAGNOSIS — E119 Type 2 diabetes mellitus without complications: Secondary | ICD-10-CM | POA: Diagnosis not present

## 2021-01-10 DIAGNOSIS — F32A Depression, unspecified: Secondary | ICD-10-CM | POA: Diagnosis not present

## 2021-01-10 DIAGNOSIS — E871 Hypo-osmolality and hyponatremia: Secondary | ICD-10-CM | POA: Diagnosis not present

## 2021-01-10 DIAGNOSIS — R29700 NIHSS score 0: Secondary | ICD-10-CM | POA: Diagnosis not present

## 2021-01-10 DIAGNOSIS — R519 Headache, unspecified: Secondary | ICD-10-CM | POA: Diagnosis not present

## 2021-01-10 DIAGNOSIS — E039 Hypothyroidism, unspecified: Secondary | ICD-10-CM | POA: Diagnosis not present

## 2021-01-10 DIAGNOSIS — Z7982 Long term (current) use of aspirin: Secondary | ICD-10-CM | POA: Diagnosis not present

## 2021-01-10 DIAGNOSIS — I6381 Other cerebral infarction due to occlusion or stenosis of small artery: Secondary | ICD-10-CM

## 2021-01-10 DIAGNOSIS — E785 Hyperlipidemia, unspecified: Secondary | ICD-10-CM | POA: Diagnosis not present

## 2021-01-10 DIAGNOSIS — Z794 Long term (current) use of insulin: Secondary | ICD-10-CM | POA: Diagnosis not present

## 2021-01-10 DIAGNOSIS — I10 Essential (primary) hypertension: Secondary | ICD-10-CM | POA: Diagnosis not present

## 2021-01-10 DIAGNOSIS — Z7901 Long term (current) use of anticoagulants: Secondary | ICD-10-CM | POA: Diagnosis not present

## 2021-01-10 DIAGNOSIS — Z7289 Other problems related to lifestyle: Secondary | ICD-10-CM | POA: Diagnosis not present

## 2021-01-10 DIAGNOSIS — F419 Anxiety disorder, unspecified: Secondary | ICD-10-CM | POA: Diagnosis not present

## 2021-01-10 DIAGNOSIS — E7800 Pure hypercholesterolemia, unspecified: Secondary | ICD-10-CM | POA: Diagnosis not present

## 2021-01-10 DIAGNOSIS — I6350 Cerebral infarction due to unspecified occlusion or stenosis of unspecified cerebral artery: Secondary | ICD-10-CM | POA: Diagnosis not present

## 2021-01-10 DIAGNOSIS — R42 Dizziness and giddiness: Secondary | ICD-10-CM | POA: Diagnosis not present

## 2021-01-10 DIAGNOSIS — K219 Gastro-esophageal reflux disease without esophagitis: Secondary | ICD-10-CM | POA: Diagnosis not present

## 2021-01-10 DIAGNOSIS — I6501 Occlusion and stenosis of right vertebral artery: Secondary | ICD-10-CM | POA: Diagnosis not present

## 2021-01-10 HISTORY — DX: Other cerebral infarction due to occlusion or stenosis of small artery: I63.81

## 2021-01-11 NOTE — Progress Notes (Signed)
Cancelled.  

## 2021-01-12 ENCOUNTER — Ambulatory Visit (INDEPENDENT_AMBULATORY_CARE_PROVIDER_SITE_OTHER): Payer: PPO | Admitting: Family Medicine

## 2021-01-12 DIAGNOSIS — E1065 Type 1 diabetes mellitus with hyperglycemia: Secondary | ICD-10-CM

## 2021-01-12 DIAGNOSIS — Z780 Asymptomatic menopausal state: Secondary | ICD-10-CM

## 2021-01-12 DIAGNOSIS — E782 Mixed hyperlipidemia: Secondary | ICD-10-CM

## 2021-01-12 DIAGNOSIS — F331 Major depressive disorder, recurrent, moderate: Secondary | ICD-10-CM

## 2021-01-12 DIAGNOSIS — E039 Hypothyroidism, unspecified: Secondary | ICD-10-CM

## 2021-01-12 DIAGNOSIS — I10 Essential (primary) hypertension: Secondary | ICD-10-CM

## 2021-01-15 ENCOUNTER — Telehealth: Payer: Self-pay

## 2021-01-15 ENCOUNTER — Telehealth: Payer: Self-pay | Admitting: Neurology

## 2021-01-15 NOTE — Telephone Encounter (Signed)
  Transition Care Management Follow-up Telephone Call    Evelean Bigler 09-15-1951  Admit Date: 01/10/21 Discharge Date: 01/12/21 Discharged from where: [facility]  Diagnoses: Right [place] CVA (I63.50), Headache, Hyponatremia, Dizziness  2 day post discharge: 01/14/21 7 day post discharge: 01/19/21 14 day post discharge: 01/26/21  Candie Gintz was discharged from [facility] on Friday, 01/12/21 with the diagnoses listed above.  She was contacted today via telephone in regards to transition of care.  Patient refused to see any provider at our office other than Dr Tobie Poet.  There is no appointment availability within the recommended one week so I will discuss with Dr Tobie Poet for other availability.  Patient seen in ED with complaints of nausea, dizziness, and headache x 3 weeks.  -CT Head showed no acute findings -CTA of the neck showed slight narrowing in the right V4 segment, however no acute findings -MRI Brain showed right dorsal pons subacute small-vessel infarct -Bubble Echo - Negative  Tele Neurology followed during admission.  Patient started on ASA and Plavix.  Plavix is to be discontinued after 2 weeks.  Patient  Evaluated by PT and OT and noted to have no needs.   A1C noted to be 9 - followed by endocrinology  LDL elevated at 113, Rosuvastatin dose increased from 10 mg to 20 mg  B12 was slightly low at 312 and started on oral supplementation  Discharge Instructions:  -Follow-up with PCP in 5-7 days, schedule follow-up appointment with neurology and endocrinology in the next 2-4 weeks.   -Follow-up with [facility] for 30 day heart monitor -Recommended discontinue use of alcohol and tobacco.  Discharge Medications/Changes: -B12 1000 mcg PO QD #30 -Aspirin 81 mg PO QD #90 -Plavix 75 mg PO QD #14 -Rosuvastatin 20 mg QD #60  Items Reviewed:  Did the pt receive and understand the discharge instructions provided? Yes   Medications obtained and verified? Yes    Other? No   Any new allergies since your discharge? No   Dietary orders reviewed? Yes  Do you have support at home? Yes   Home Care and Equipment/Supplies: Were home health services ordered? no If so, what is the name of the agency? N/A  Has the agency set up a time to come to the patient's home? not applicable Were any new equipment or medical supplies ordered?  No What is the name of the medical supply agency? N/A Were you able to get the supplies/equipment? not applicable Do you have any questions related to the use of the equipment or supplies? N/A  Functional Questionnaire: (I = Independent and D = Dependent) ADLs: I  Bathing/Dressing- I  Meal Prep- I  Eating- I  Maintaining continence- I  Transferring/Ambulation- I  Managing Meds- I   Any patient concerns? no  Follow up appointments reviewed:  PCP Hospital f/u appt confirmed? Yes  Scheduled to see Dr Tobie Poet on 01/16/21 @ 9:45.  [facility] f/u appt confirmed? No  (Neuro, Cardiology, Endocrinology)  Are transportation arrangements needed? No   If their condition worsens, is the pt aware to call PCP or go to the Emergency Dept.? Yes  Was the patient provided with contact information for the PCP's office/after hours number? Yes  Was to pt encouraged to call back with questions or concerns? Yes    Shelle Iron, LPN 20/25/42 7:06 PM

## 2021-01-15 NOTE — Telephone Encounter (Signed)
Pt states she did not have that appt on the 25th. Not sure if the pt remember. Per chart pt was seen 21st and the 25th Pt states she was by her PCP on 23rd and then she was seen by Urgent care office at [facility] in [place].  23rd at [facility] pt was there 23-25th.

## 2021-01-15 NOTE — Telephone Encounter (Signed)
Pls confirm which hospital she went to, I don't see anything on Epic, she had seen her PCP on 2/25 and there is no mention of stroke or ER visit. Let's get records, pls let her know we will put her on cancellation list. Thanks

## 2021-01-15 NOTE — Telephone Encounter (Signed)
Patient called in stating she had gone to the emergency room on 01/10/21 and was told she had had a stroke. She was told to follow up soon with Dr. Delice Lesch. There isn't anything available until November.

## 2021-01-16 ENCOUNTER — Ambulatory Visit (INDEPENDENT_AMBULATORY_CARE_PROVIDER_SITE_OTHER): Payer: PPO | Admitting: Family Medicine

## 2021-01-16 ENCOUNTER — Other Ambulatory Visit: Payer: Self-pay

## 2021-01-16 VITALS — BP 130/60 | HR 84 | Temp 97.5°F | Resp 18 | Ht 62.0 in | Wt 144.0 lb

## 2021-01-16 DIAGNOSIS — I10 Essential (primary) hypertension: Secondary | ICD-10-CM | POA: Diagnosis not present

## 2021-01-16 DIAGNOSIS — I6350 Cerebral infarction due to unspecified occlusion or stenosis of unspecified cerebral artery: Secondary | ICD-10-CM | POA: Diagnosis not present

## 2021-01-16 DIAGNOSIS — E782 Mixed hyperlipidemia: Secondary | ICD-10-CM | POA: Diagnosis not present

## 2021-01-16 DIAGNOSIS — E1065 Type 1 diabetes mellitus with hyperglycemia: Secondary | ICD-10-CM | POA: Diagnosis not present

## 2021-01-16 DIAGNOSIS — F331 Major depressive disorder, recurrent, moderate: Secondary | ICD-10-CM

## 2021-01-16 MED ORDER — ROSUVASTATIN CALCIUM 20 MG PO TABS: 20.0000 mg | ORAL_TABLET | Freq: Every day | ORAL | 0 refills | Status: DC

## 2021-01-16 NOTE — Telephone Encounter (Signed)
telephone call to pt, Advised pt of her appt with Dr.Aqunio on 09/24/21 at 3:30 pm. She is added to the cancellation list.

## 2021-01-16 NOTE — Progress Notes (Signed)
Subjective:  Patient ID: [name], female    DOB: Nov 13, 1951  Age: 70 y.o. MRN: [id number]  Chief Complaint  Patient presents with  . Hospitalization Follow-up    HPI Pt was in prep for colonoscopy. Got a terrible headache, dizziness (tilting sensation), and weakness.  Monday: She was seen virtually dehydrated here and was given zithromax for sinusitis.  Tuesday: pt was seen urgent care. The urgent care did not know what was wrong.  Wednesday: ED. Admitted for CVA. Treated for migraine with toradol/reglan/antihistamine. Her headache resolved with this treatment.  Work up was as follows: CT scan of Brain: normal. 2 D ECHO normal. Carotid dopplers normal.  MRI abnormal. Showed a stroke. Discharged on plavix for 2 weeks and aspirin started. Increased crestor 20 mg daily.  Diabetes worsened. Sugars 125-150s now. Was into the 500s while in the hospital and just after she came home.  Lantus 14 Units had increased to 17 while sugars were up.  Novolog increased to 5 U before breakfast, 5U at lunch, and 2 U before supper.   Current Outpatient Medications on File Prior to Visit  Medication Sig Dispense Refill  . acyclovir (ZOVIRAX) 800 MG tablet TAKE 1 TABLET (800 MG) BY MOUTH 4 TIMES PER DAY FOR 3-7 DAYS AS NEEDED 30 tablet 2  . amLODipine (NORVASC) 10 MG tablet TAKE 1 TABLET BY MOUTH EVERY DAY 90 tablet 3  . aspirin EC 81 MG tablet Take 81 mg by mouth 2 (two) times a week.     [name] buPROPion (WELLBUTRIN XL) 150 MG 24 hr tablet TAKE 1 TABLET BY MOUTH EVERY DAY 90 tablet 0  . insulin glargine (LANTUS) 100 UNIT/ML injection Inject 14 Units into the skin every evening. Reported on 02/02/2016    . levothyroxine (SYNTHROID, LEVOTHROID) 125 MCG tablet Take 125 mcg by mouth daily before breakfast.    . lisinopril-hydrochlorothiazide (ZESTORETIC) 10-12.5 MG tablet TAKE 1 TABLET BY MOUTH EVERY DAY 90 tablet 1  . LORazepam (ATIVAN) 0.5 MG tablet TAKE 1 TABLET (0.5 MG) BY MOUTH 1 TIMES PER DAY AS  NEEDED FOR ANXIETY 30 tablet 2  . metoCLOPramide (REGLAN) 10 MG tablet Take 1 tablet (10 mg total) by mouth daily. 30 tablet 0  . Omega-3 Fatty Acids (FISH OIL PO) Take 1 capsule by mouth daily.    [name] omeprazole (PRILOSEC) 40 MG capsule TAKE 1 CAPSULE BY MOUTH EVERY DAY 90 capsule 3  . venlafaxine XR (EFFEXOR-XR) 150 MG 24 hr capsule TAKE 1 CAPSULE BY MOUTH EVERY DAY 90 capsule 2  . Vitamin D, Ergocalciferol, (DRISDOL) 1.25 MG (50000 UNIT) CAPS capsule TAKE 1 CAPSULE BY MOUTH EACH WEEK 12 capsule 3  . insulin aspart (NOVOLOG) 100 UNIT/ML injection Inject 2-3 Units into the skin 3 (three) times daily before meals. Sliding scale    . ONETOUCH ULTRA test strip SMARTSIG:6 Via Meter 6 Times Daily (Patient not taking: Reported on 01/16/2021)     No current facility-administered medications on file prior to visit.   Past Medical History:  Diagnosis Date  . Depression   . Diabetes mellitus without complication ([place])   . GERD (gastroesophageal reflux disease)   . Hypertension   . Hypothyroidism   . Type 1 diabetes [facility])    Past Surgical History:  Procedure Laterality Date  . APPENDECTOMY    . APPLICATION OF WOUND VAC Left 08/03/2019   Procedure: Application Of Wound Vac;  Surgeon: Altamese , MD;  Location: [facility];  Service: Orthopedics;  Laterality: Left;  . CESAREAN  SECTION    . ESOPHAGOGASTRODUODENOSCOPY (EGD) WITH PROPOFOL N/A 02/02/2016   Procedure: ESOPHAGOGASTRODUODENOSCOPY (EGD) WITH PROPOFOL;  Surgeon: Josefine Class, MD;  Location: [facility] ENDOSCOPY;  Service: Endoscopy;  Laterality: N/A;  . HAND SURGERY    . JOINT REPLACEMENT Bilateral    Knee   . ORIF ANKLE FRACTURE Left 08/03/2019   with wound vac applied   . ORIF ANKLE FRACTURE Left 08/03/2019   Procedure: OPEN REDUCTION INTERNAL FIXATION (ORIF) ANKLE FRACTURE;  Surgeon: Altamese [employer], MD;  Location: [facility];  Service: Orthopedics;  Laterality: Left;  . TIBIALIS TENDON TRANSFER / REPAIR      Family History  Problem Relation  Age of Onset  . Cancer Mother        Colon cancer   . Dementia Father   . Neuropathy Father    Social History   Socioeconomic History  . Marital status: Single    Spouse name: Not on file  . Number of children: Not on file  . Years of education: Not on file  . Highest education level: Not on file  Occupational History  . Not on file  Tobacco Use  . Smoking status: Never Smoker  . Smokeless tobacco: Never Used  Vaping Use  . Vaping Use: Never used  Substance and Sexual Activity  . Alcohol use: Yes    Alcohol/week: 1.0 standard drink    Types: 1 Glasses of wine per week    Comment: everyday  . Drug use: Never  . Sexual activity: Not Currently  Other Topics Concern  . Not on file  Social History Narrative   Right hand   Lives alone   Social Determinants of Health   Financial Resource Strain: Not on file  Food Insecurity: Not on file  Transportation Needs: Not on file  Physical Activity: Not on file  Stress: Not on file  Social Connections: Not on file    Review of Systems  Constitutional: Negative for chills, fatigue and fever.  HENT: Negative for congestion, rhinorrhea and sore throat.   Respiratory: Negative for cough and shortness of breath.   Cardiovascular: Negative for chest pain.  Gastrointestinal: Negative for abdominal distention, abdominal pain, constipation, diarrhea, nausea and vomiting.  Genitourinary: Positive for frequency. Negative for dysuria and urgency.  Musculoskeletal: Negative for back pain and myalgias.  Neurological: Negative for dizziness, weakness, light-headedness and headaches.  Psychiatric/Behavioral: Negative for dysphoric mood. The patient is not nervous/anxious.      Objective:  BP 130/60   Pulse 84   Temp (!) 97.5 F (36.4 C)   Resp 18   Ht 5\' 2"  (1.575 m)   Wt 144 lb (65.3 kg)   BMI 26.34 kg/m   BP/Weight 01/16/2021 01/08/2021 75/64/3329  Systolic BP 518 841 660  Diastolic BP 60 60 60  Wt. (Lbs) 144 145 145  BMI 26.34  26.52 25.69    Physical Exam Vitals reviewed.  Constitutional:      Appearance: Normal appearance. She is normal weight.  Cardiovascular:     Rate and Rhythm: Normal rate and regular rhythm.     Heart sounds: Normal heart sounds.  Pulmonary:     Effort: Pulmonary effort is normal.     Breath sounds: Normal breath sounds.  Abdominal:     Palpations: Abdomen is soft.     Tenderness: There is no abdominal tenderness.  Neurological:     Mental Status: She is alert and oriented to person, place, and time.  Psychiatric:  Mood and Affect: Mood normal.        Behavior: Behavior normal.     Lab Results  Component Value Date   WBC 5.9 01/08/2021   HGB 10.6 (L) 01/08/2021   HCT 33.2 (L) 01/08/2021   PLT 405 01/08/2021   GLUCOSE 213 (H) 01/08/2021   ALT 13 01/08/2021   AST 18 01/08/2021   NA 131 (L) 01/08/2021   K 4.2 01/08/2021   CL 94 (L) 01/08/2021   CREATININE 0.96 01/08/2021   BUN 16 01/08/2021   CO2 22 01/08/2021   TSH 0.779 09/01/2020   INR 0.9 08/03/2019   HGBA1C 8.9 (H) 09/01/2020      Assessment & Plan:   1. Type 1 diabetes mellitus with hyperglycemia ([place]) Keep follow up appt with endocrinology.  2. Essential hypertension The current medical regimen is effective;  continue present plan and medications.  3. Depression, major, recurrent, moderate ([facility]) The current medical regimen is effective;  continue present plan and medications.  4. Mixed hyperlipidemia Crestor recently increased to 20 mg daily. Continue to work on eating a healthy diet and exercise.    5. Right pontine CVA ([place])  Pt has appointment for zio patch with cardiology. Has appt with [facility] neurology.  Continue plavix x 2 weeks, then continue only aspirin 81 mg once daily. Crestor increased to 20 mg daily.  Meds ordered this encounter  Medications  . rosuvastatin (CRESTOR) 20 MG tablet    Sig: Take 1 tablet (20 mg total) by mouth daily.    Dispense:  90 tablet    Refill:  0     Follow-up: Return in about 3 months (around 04/18/2021) for fasting.  An After Visit Summary was printed and given to the patient.  Rochel Brome, MD [facility] [phone]

## 2021-01-17 DIAGNOSIS — K5900 Constipation, unspecified: Secondary | ICD-10-CM | POA: Diagnosis not present

## 2021-01-17 DIAGNOSIS — K219 Gastro-esophageal reflux disease without esophagitis: Secondary | ICD-10-CM | POA: Diagnosis not present

## 2021-01-17 DIAGNOSIS — Z800 Family history of malignant neoplasm of digestive organs: Secondary | ICD-10-CM | POA: Diagnosis not present

## 2021-01-19 ENCOUNTER — Other Ambulatory Visit: Payer: Self-pay

## 2021-01-19 LAB — B12 AND FOLATE PANEL
Folate: 11 ng/mL (ref >3.0)
Vitamin B-12: 276 pg/mL (ref 232–1245)

## 2021-01-19 LAB — IRON AND TIBC
Iron Saturation: 10 % — ABNORMAL LOW (ref 15–55)
Iron: 37 ug/dL (ref 27–139)
Total Iron Binding Capacity: 383 ug/dL (ref 250–450)
UIBC: 346 ug/dL (ref 118–369)

## 2021-01-19 LAB — SPECIMEN STATUS REPORT

## 2021-01-19 LAB — METHYLMALONIC ACID, SERUM: Methylmalonic Acid: 178 nmol/L (ref 0–378)

## 2021-01-19 MED ORDER — CLOPIDOGREL BISULFATE 75 MG PO TABS: 75.0000 mg | ORAL_TABLET | Freq: Every day | ORAL | 0 refills | Status: DC

## 2021-01-20 ENCOUNTER — Other Ambulatory Visit: Payer: Self-pay | Admitting: Legal Medicine

## 2021-01-20 DIAGNOSIS — G43001 Migraine without aura, not intractable, with status migrainosus: Secondary | ICD-10-CM

## 2021-01-20 MED ORDER — NURTEC 75 MG PO TBDP: 75.0000 mg | ORAL_TABLET | Freq: Once | ORAL | 0 refills | Status: DC | PRN

## 2021-01-20 MED ORDER — ONDANSETRON HCL 4 MG PO TABS: 4.0000 mg | ORAL_TABLET | Freq: Three times a day (TID) | ORAL | 0 refills | Status: DC | PRN

## 2021-01-21 ENCOUNTER — Encounter: Payer: Self-pay | Admitting: Family Medicine

## 2021-01-22 ENCOUNTER — Ambulatory Visit: Payer: PPO | Admitting: Neurology

## 2021-01-22 ENCOUNTER — Other Ambulatory Visit: Payer: Self-pay | Admitting: Neurology

## 2021-01-22 ENCOUNTER — Other Ambulatory Visit: Payer: Self-pay

## 2021-01-22 ENCOUNTER — Encounter: Payer: Self-pay | Admitting: Neurology

## 2021-01-22 VITALS — BP 135/56 | HR 90 | Ht 62.0 in | Wt 142.0 lb

## 2021-01-22 DIAGNOSIS — I6350 Cerebral infarction due to unspecified occlusion or stenosis of unspecified cerebral artery: Secondary | ICD-10-CM | POA: Diagnosis not present

## 2021-01-22 DIAGNOSIS — R519 Headache, unspecified: Secondary | ICD-10-CM

## 2021-01-22 NOTE — Patient Instructions (Addendum)
1. Bloodwork for ESR. If normal, we will plan to start a daily headache preventative medication. Your provider has requested that you have labwork completed today. Please go to [facility] ([phone]) on the second floor of this building before leaving the office today. You do not need to check in. If you are not called within 15 minutes please check with the front desk.   2. Continue with plans for daily aspirin, control of blood sugar, cholesterol, and BP   3. Follow-up as scheduled in November, call for any changes

## 2021-01-22 NOTE — Progress Notes (Signed)
NEUROLOGY FOLLOW UP OFFICE NOTE  [name] [id number] 1951-11-11  HISTORY OF PRESENT ILLNESS: I had the pleasure of seeing [name] in follow-up in the neurology clinic on 01/22/2021.  The patient was last seen 5 months ago for memory loss. She presents for an earlier visit today after recent hospitalization on 01/10/21 for stroke. She presented to [facility] for generalized malaise. She had a colonoscopy 3 weeks prior and while doing the prep, she began having abdominal cramping, headaches, generalized aches, which did not resolve after the procedure. She feels lightheaded when standing. She was found to have orthostatic hypotension and Lisinopril was stopped. CT head and CTA angiogram no acute changes, there was some narrowing of the V4 segment of the right vertebral artery. MRI brain showed a subacute small vessel infarct in the right dorsal pons. Echocardiogram no significant abnormalities. She was started on aspirin and Plavix, then after 2 weeks stop Plavix. LDL was 113, rosuvastatin dose increased to 35m daily. HbA1c was 9. She was evaluated by PT and OT with no deficits seen on exam. She was also noted to report daily alcohol use. She had a migraine on hospital admission treated with migraine cocktail.  She denies any prior history of headaches. She states that headaches started during colonoscopy prep but have not gone away since. She describes a near-constant frontal ache, with some nausea and light sensitivity. No visual obscurations. The headache resolved after she had the migraine cocktail, then recurred at a 4/10 intensity. She was given samples for Nurtec which she has take the past 2 days. The headaches would go away for most of the day, then come back in the evening. She denies any further dizziness, no focal numbness/tingling/weakness, speech difficulties. No prior head injury or recent infection. She was previously taking aspirin only twice a week due to easy  bruising/bleeding.    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 She started noticing changes last school year, she was told by co-workers that she was asking the same questions. Her son has anxiety and has been concerned because she comes down to the kitchen and does not see him to her side. He thinks there is something wrong. She has occasional word-finding difficulties. She denies getting lost driving, no missed bills or medications. She denies any difficulties with ADLs. She drinks 1-2 glasses of wine every night for the past 8-10 years. She denies any head injuries. She reports they were told her father has dementia, but they are unsure if true. Her sister had strokes with some cognitive changes. She has a history of anxiety and depression, reporting rare anxiety. She denies any worsening stress, but that she had to learn a lot of new things for the new school year. She denies any headaches, dizziness, diplopia, dysarthria, dysphagia, neck/back pain, focal numbness/tingling/weakness, bowel/bladder dysfunction. No anosmia, tremors, no falls.   TSH and B12 done at PCP office were normal. She had an MRI brain with and without contrast done 04/05/16 which was reported as unremarkable, mild chronic small vessel disease and atrophy, no acute changes. Images unavailable for review.   PAST MEDICAL HISTORY: Past Medical History:  Diagnosis Date  . Depression   . Diabetes mellitus [place] complication ([place]   . GERD (gastroesophageal reflux disease)   . Hypertension   . Hypothyroidism   . Type 1 diabetes ([facility]     MEDICATIONS: Current Outpatient  Medications on File Prior to Visit  Medication Sig Dispense Refill  . acyclovir (ZOVIRAX) 800 MG tablet TAKE 1 TABLET (800 MG) BY MOUTH 4 TIMES PER DAY FOR 3-7 DAYS AS NEEDED 30 tablet 2  . amLODipine (NORVASC) 10 MG  tablet TAKE 1 TABLET BY MOUTH EVERY DAY 90 tablet 3  . aspirin EC 81 MG tablet Take 81 mg by mouth 2 (two) times a week.     [name] buPROPion (WELLBUTRIN XL) 150 MG 24 hr tablet TAKE 1 TABLET BY MOUTH EVERY DAY 90 tablet 0  . clopidogrel (PLAVIX) 75 MG tablet Take 1 tablet (75 mg total) by mouth daily. 14 tablet 0  . insulin aspart (NOVOLOG) 100 UNIT/ML injection Inject 2-3 Units into the skin 3 (three) times daily before meals. Sliding scale    . insulin glargine (LANTUS) 100 UNIT/ML injection Inject 14 Units into the skin every evening. Reported on 02/02/2016    . levothyroxine (SYNTHROID, LEVOTHROID) 125 MCG tablet Take 125 mcg by mouth daily before breakfast.    . lisinopril-hydrochlorothiazide (ZESTORETIC) 10-12.5 MG tablet TAKE 1 TABLET BY MOUTH EVERY DAY 90 tablet 1  . LORazepam (ATIVAN) 0.5 MG tablet TAKE 1 TABLET (0.5 MG) BY MOUTH 1 TIMES PER DAY AS NEEDED FOR ANXIETY 30 tablet 2  . metoCLOPramide (REGLAN) 10 MG tablet Take 1 tablet (10 mg total) by mouth daily. 30 tablet 0  . Omega-3 Fatty Acids (FISH OIL PO) Take 1 capsule by mouth daily.    [name] omeprazole (PRILOSEC) 40 MG capsule TAKE 1 CAPSULE BY MOUTH EVERY DAY 90 capsule 3  . ondansetron (ZOFRAN) 4 MG tablet Take 1 tablet (4 mg total) by mouth every 8 (eight) hours as needed for nausea or vomiting. 20 tablet 0  . ONETOUCH ULTRA test strip SMARTSIG:6 Via Meter 6 Times Daily (Patient not taking: Reported on 01/16/2021)    . Rimegepant Sulfate (NURTEC) 75 MG TBDP Take 75 mg by mouth once as needed for up to 1 dose. 30 tablet 0  . rosuvastatin (CRESTOR) 20 MG tablet Take 1 tablet (20 mg total) by mouth daily. 90 tablet 0  . venlafaxine XR (EFFEXOR-XR) 150 MG 24 hr capsule TAKE 1 CAPSULE BY MOUTH EVERY DAY 90 capsule 2  . Vitamin D, Ergocalciferol, (DRISDOL) 1.25 MG (50000 UNIT) CAPS capsule TAKE 1 CAPSULE BY MOUTH EACH WEEK 12 capsule 3   No current facility-administered medications on file prior to visit.    ALLERGIES: No Known  Allergies  FAMILY HISTORY: Family History  Problem Relation Age of Onset  . Cancer Mother        Colon cancer   . Dementia Father   . Neuropathy Father     SOCIAL HISTORY: Social History   Socioeconomic History  . Marital status: Single    Spouse name: Not on file  . Number of children: Not on file  . Years of education: Not on file  . Highest education level: Not on file  Occupational History  . Not on file  Tobacco Use  . Smoking status: Never Smoker  . Smokeless tobacco: Never Used  Vaping Use  . Vaping Use: Never used  Substance and Sexual Activity  . Alcohol use: Yes    Alcohol/week: 1.0 standard drink    Types: 1 Glasses of wine per week    Comment: everyday  . Drug use: Never  . Sexual activity: Not Currently  Other Topics Concern  . Not on file  Social History Narrative   Right hand  Lives alone   Social Determinants of Health   Financial Resource Strain: Not on file  Food Insecurity: Not on file  Transportation Needs: Not on file  Physical Activity: Not on file  Stress: Not on file  Social Connections: Not on file  Intimate Partner Violence: Not on file     PHYSICAL EXAM: Vitals:   01/22/21 1516  BP: (!) 135/56  Pulse: 90  SpO2: 99%   General: No acute distress Head:  Normocephalic/atraumatic, no temporal tenderness or temporal artery ropiness Skin/Extremities: No rash, no edema Neurological Exam: alert and awake. No aphasia or dysarthria. Fund of knowledge is appropriate.  Attention and concentration are normal.   Cranial nerves: Pupils equal, round. Extraocular movements intact with no nystagmus. Visual fields full.  No facial asymmetry.  Motor: Bulk and tone normal, muscle strength 5/5 throughout with no pronator drift. Sensation intact to all modalities. Reflexes +1 throughout. Finger to nose testing intact.  Gait narrow-based and steady, no ataxia   IMPRESSION: This is a 70 yo RH woman with a history of hypertension, hyperlipidemia,  diabetes, who had been seen in our office for memory loss. She presents for an urgent visit due to recent subacute small vessel infarct in the right dorsal pons, found when she presented to the ER in 01/10/21 with generalized malaise, headaches that started with her colonoscopy prep 2 weeks prior. Stroke workup showed elevated HbA1c and LDL, discussed control of vascular risk factors and continuation of DAPT then aspirin alone daily thereafter. BP today 135/56. Her exam today is normal. Her main concern are new onset headaches. There is no temporal tenderness but in this age group with new onset headaches, will rule out temporal arteritis, check ESR. If normal, we will plan to start a daily headache preventative medication, either gabapentin or nortriptyline. She has prn Nurtec, discussed that this should only be taken once in a day and should not take more than 2-3 a week to avoid rebound headaches. Follow-up as scheduled in November, call for any changes.   Thank you for allowing me to participate in her care.  Please do not hesitate to call for any questions or concerns.   Ellouise Newer, M.D.   CC: Dr. Tobie Poet

## 2021-01-23 LAB — SEDIMENTATION RATE: Sed Rate: 3 mm/hr (ref 0–40)

## 2021-01-29 ENCOUNTER — Telehealth: Payer: Self-pay | Admitting: Neurology

## 2021-01-29 MED ORDER — GABAPENTIN 100 MG PO CAPS: 100.0000 mg | ORAL_CAPSULE | Freq: Every day | ORAL | 2 refills | Status: DC

## 2021-01-29 NOTE — Telephone Encounter (Signed)
Pt called and informed that the bloodwork did not show any inflammation. If she is agreeable, would start a daily medication to help cut down on headaches, called gabapentin 100mg  qhs. Main side effect is drowsiness, would hold off on driving the next day to see how she feels first. It is a very low dose, as long as she is not having side effects and still having headaches, we can increase the dose in 2 weeks.

## 2021-01-29 NOTE — Telephone Encounter (Signed)
Patient called for recent lab results

## 2021-01-29 NOTE — Addendum Note (Signed)
Addended by: Jake Seats on: 01/29/2021 11:22 AM   Modules accepted: Orders

## 2021-01-29 NOTE — Telephone Encounter (Signed)
I sent Result note on 3/11:  Pls let her know the bloodwork did not show any inflammation. If she is agreeable, would start a daily medication to help cut down on headaches, called gabapentin 100mg  qhs. Main side effect is drowsiness, would hold off on driving the next day to see how she feels first. It is a very low dose, as long as she is not having side effects and still having headaches, we can increase the dose in 2 weeks. Thanks!

## 2021-02-05 ENCOUNTER — Telehealth: Payer: Self-pay | Admitting: Neurology

## 2021-02-05 ENCOUNTER — Other Ambulatory Visit: Payer: Self-pay

## 2021-02-05 NOTE — Telephone Encounter (Signed)
Patient called in returning [name]'s call

## 2021-02-05 NOTE — Telephone Encounter (Signed)
Looks like it is gabapentin 100mg  please advise on recommendations.

## 2021-02-05 NOTE — Telephone Encounter (Signed)
Pls confirm, the reason we started the gabapentin was because she was having frequent headaches. Is she saying headaches are more since starting gabapentin or she is still having them even with medication? There is a lot of room to increase gabapentin to help with headaches, as long as no side effects. Thanks

## 2021-02-05 NOTE — Telephone Encounter (Signed)
Patient called and said she was trying a new medication that starts with a "G" but she doesn't remember the name. She said she has had a headache for the last 3 days and would like some advise about that.  [facility] on [address]

## 2021-02-05 NOTE — Telephone Encounter (Signed)
No answer at 345003/21/2022

## 2021-02-06 ENCOUNTER — Other Ambulatory Visit: Payer: Self-pay

## 2021-02-06 MED ORDER — GABAPENTIN 100 MG PO CAPS: 200.0000 mg | ORAL_CAPSULE | Freq: Every day | ORAL | 1 refills | Status: DC

## 2021-02-06 NOTE — Telephone Encounter (Signed)
Pt called and informed to increase gabapentin 100mg  take 2 caps qhs

## 2021-02-06 NOTE — Telephone Encounter (Signed)
Pls have her increase gabapentin 100mg : Take 2 caps qhs. Pls send in updated Rx, thanks

## 2021-02-06 NOTE — Telephone Encounter (Signed)
Pt is having no side effects, would like dose increased on gabapentin 100mg  daily

## 2021-02-06 NOTE — Telephone Encounter (Signed)
Pt called and informed to increase gabapentin 100mg : Take 2 caps qhs. New script sent to pharmacy on file

## 2021-02-13 DIAGNOSIS — E1065 Type 1 diabetes mellitus with hyperglycemia: Secondary | ICD-10-CM | POA: Diagnosis not present

## 2021-02-26 ENCOUNTER — Other Ambulatory Visit: Payer: Self-pay | Admitting: Family Medicine

## 2021-03-14 DIAGNOSIS — Z794 Long term (current) use of insulin: Secondary | ICD-10-CM | POA: Diagnosis not present

## 2021-03-14 DIAGNOSIS — Z8673 Personal history of transient ischemic attack (TIA), and cerebral infarction without residual deficits: Secondary | ICD-10-CM | POA: Diagnosis not present

## 2021-03-14 DIAGNOSIS — E1065 Type 1 diabetes mellitus with hyperglycemia: Secondary | ICD-10-CM | POA: Diagnosis not present

## 2021-03-14 DIAGNOSIS — E10319 Type 1 diabetes mellitus with unspecified diabetic retinopathy without macular edema: Secondary | ICD-10-CM | POA: Diagnosis not present

## 2021-03-14 DIAGNOSIS — E039 Hypothyroidism, unspecified: Secondary | ICD-10-CM | POA: Diagnosis not present

## 2021-03-14 DIAGNOSIS — E782 Mixed hyperlipidemia: Secondary | ICD-10-CM | POA: Diagnosis not present

## 2021-03-23 DIAGNOSIS — R928 Other abnormal and inconclusive findings on diagnostic imaging of breast: Secondary | ICD-10-CM | POA: Diagnosis not present

## 2021-03-23 DIAGNOSIS — R922 Inconclusive mammogram: Secondary | ICD-10-CM | POA: Diagnosis not present

## 2021-03-23 LAB — HM MAMMOGRAPHY

## 2021-03-24 ENCOUNTER — Other Ambulatory Visit: Payer: Self-pay | Admitting: Physician Assistant

## 2021-03-27 ENCOUNTER — Telehealth: Payer: Self-pay | Admitting: Family Medicine

## 2021-03-27 NOTE — Progress Notes (Signed)
  Chronic Care Management   Note  03/27/2021 Name: [name] MRN: [id number] DOB: 19-Oct-1951  [name] is a 70 y.o. year old female who is a primary care patient of Cox, Kirsten, MD. I reached out to Thomes Lolling by phone today in response to a referral sent by Ms. Shirlee More PCP, Cox, Kirsten, MD.   [name] was given information about Chronic Care Management services today including:  1. CCM service includes personalized support from designated clinical staff supervised by her physician, including individualized plan of care and coordination with other care providers 2. 24/7 contact phone numbers for assistance for urgent and routine care needs. 3. Service will only be billed when office clinical staff spend 20 minutes or more in a month to coordinate care. 4. Only one practitioner may furnish and bill the service in a calendar month. 5. The patient may stop CCM services at any time (effective at the end of the month) by phone call to the office staff.   Patient agreed to services and verbal consent obtained.   Follow up plan:   Carley Perdue [employer] Scheduler

## 2021-04-01 ENCOUNTER — Other Ambulatory Visit: Payer: Self-pay | Admitting: Physician Assistant

## 2021-04-01 DIAGNOSIS — F331 Major depressive disorder, recurrent, moderate: Secondary | ICD-10-CM

## 2021-04-10 DIAGNOSIS — E1065 Type 1 diabetes mellitus with hyperglycemia: Secondary | ICD-10-CM | POA: Diagnosis not present

## 2021-04-10 DIAGNOSIS — E10319 Type 1 diabetes mellitus with unspecified diabetic retinopathy without macular edema: Secondary | ICD-10-CM | POA: Diagnosis not present

## 2021-04-11 DIAGNOSIS — E1065 Type 1 diabetes mellitus with hyperglycemia: Secondary | ICD-10-CM | POA: Diagnosis not present

## 2021-04-11 DIAGNOSIS — E10319 Type 1 diabetes mellitus with unspecified diabetic retinopathy without macular edema: Secondary | ICD-10-CM | POA: Diagnosis not present

## 2021-04-17 ENCOUNTER — Other Ambulatory Visit: Payer: PPO

## 2021-04-17 ENCOUNTER — Other Ambulatory Visit: Payer: Self-pay

## 2021-04-17 DIAGNOSIS — E039 Hypothyroidism, unspecified: Secondary | ICD-10-CM

## 2021-04-17 DIAGNOSIS — I10 Essential (primary) hypertension: Secondary | ICD-10-CM | POA: Diagnosis not present

## 2021-04-17 DIAGNOSIS — E119 Type 2 diabetes mellitus without complications: Secondary | ICD-10-CM | POA: Diagnosis not present

## 2021-04-17 DIAGNOSIS — E1065 Type 1 diabetes mellitus with hyperglycemia: Secondary | ICD-10-CM

## 2021-04-17 DIAGNOSIS — E782 Mixed hyperlipidemia: Secondary | ICD-10-CM

## 2021-04-18 LAB — CBC WITH DIFFERENTIAL/PLATELET
Basophils Absolute: 0.1 10*3/uL (ref 0.0–0.2)
Basos: 2 %
EOS (ABSOLUTE): 0.4 10*3/uL (ref 0.0–0.4)
Eos: 6 %
Hematocrit: 37.9 % (ref 34.0–46.6)
Hemoglobin: 12.6 g/dL (ref 11.1–15.9)
Immature Grans (Abs): 0 10*3/uL (ref 0.0–0.1)
Immature Granulocytes: 0 %
Lymphocytes Absolute: 1.2 10*3/uL (ref 0.7–3.1)
Lymphs: 21 %
MCH: 30.7 pg (ref 26.6–33.0)
MCHC: 33.2 g/dL (ref 31.5–35.7)
MCV: 92 fL (ref 79–97)
Monocytes Absolute: 0.5 10*3/uL (ref 0.1–0.9)
Monocytes: 8 %
Neutrophils Absolute: 3.5 10*3/uL (ref 1.4–7.0)
Neutrophils: 63 %
Platelets: 348 10*3/uL (ref 150–450)
RBC: 4.1 x10E6/uL (ref 3.77–5.28)
RDW: 15.4 % (ref 11.7–15.4)
WBC: 5.6 10*3/uL (ref 3.4–10.8)

## 2021-04-18 LAB — TSH: TSH: 13 u[IU]/mL — ABNORMAL HIGH (ref 0.450–4.500)

## 2021-04-18 LAB — COMPREHENSIVE METABOLIC PANEL
ALT: 16 IU/L (ref 0–32)
AST: 16 IU/L (ref 0–40)
Albumin/Globulin Ratio: 1.9 (ref 1.2–2.2)
Albumin: 4.4 g/dL (ref 3.7–4.7)
Alkaline Phosphatase: 123 IU/L — ABNORMAL HIGH (ref 44–121)
BUN/Creatinine Ratio: 23 (ref 12–28)
BUN: 20 mg/dL (ref 8–27)
Bilirubin Total: <0.2 mg/dL (ref 0.0–1.2)
CO2: 23 mmol/L (ref 20–29)
Calcium: 9.2 mg/dL (ref 8.7–10.3)
Chloride: 95 mmol/L — ABNORMAL LOW (ref 96–106)
Creatinine, Ser: 0.88 mg/dL (ref 0.57–1.00)
Globulin, Total: 2.3 g/dL (ref 1.5–4.5)
Glucose: 84 mg/dL (ref 65–99)
Potassium: 4.3 mmol/L (ref 3.5–5.2)
Sodium: 136 mmol/L (ref 134–144)
Total Protein: 6.7 g/dL (ref 6.0–8.5)
eGFR: 70 mL/min/{1.73_m2} (ref >59)

## 2021-04-18 LAB — LIPID PANEL
Chol/HDL Ratio: 2.1 ratio (ref 0.0–4.4)
Cholesterol, Total: 245 mg/dL — ABNORMAL HIGH (ref 100–199)
HDL: 119 mg/dL (ref >39)
LDL Chol Calc (NIH): 111 mg/dL — ABNORMAL HIGH (ref 0–99)
Triglycerides: 88 mg/dL (ref 0–149)
VLDL Cholesterol Cal: 15 mg/dL (ref 5–40)

## 2021-04-18 LAB — CARDIOVASCULAR RISK ASSESSMENT

## 2021-04-19 ENCOUNTER — Ambulatory Visit: Payer: PPO | Admitting: Family Medicine

## 2021-04-23 LAB — HGB A1C W/O EAG: Hgb A1c MFr Bld: 8.5 % — ABNORMAL HIGH (ref 4.8–5.6)

## 2021-04-23 LAB — SPECIMEN STATUS REPORT

## 2021-04-24 ENCOUNTER — Other Ambulatory Visit: Payer: Self-pay

## 2021-04-24 MED ORDER — LEVOTHYROXINE SODIUM 137 MCG PO TABS: 137.0000 ug | ORAL_TABLET | Freq: Every day | ORAL | 1 refills | Status: DC

## 2021-04-24 MED ORDER — INSULIN GLARGINE 100 UNIT/ML ~~LOC~~ SOLN: 15.0000 [IU] | Freq: Every evening | SUBCUTANEOUS | 1 refills | Status: DC

## 2021-04-25 ENCOUNTER — Other Ambulatory Visit: Payer: Self-pay | Admitting: Family Medicine

## 2021-04-28 ENCOUNTER — Other Ambulatory Visit: Payer: Self-pay | Admitting: Physician Assistant

## 2021-05-07 ENCOUNTER — Telehealth: Payer: Self-pay

## 2021-05-07 NOTE — Progress Notes (Signed)
Chronic Care Management Pharmacy Assistant   Name: [name]  MRN: [id number] DOB: March 16, 1951  [name] is an 70 y.o. year old female who presents for his initial CCM visit with the clinical pharmacist.  Reason for Encounter: Initial questions call   Recent office visits:   01/16/2021: Dr. Tobie Poet (PCP)  / DM Type 1/ Refill ondasetron 4 mg. / increased Rosuvastatin 10mg  daily to 20mg . daily.   01/12/2021: Dr. Tobie Poet (PCP) / no medication changes noted   01/07/2021: Dr. Ulanda Edison (Family medicine) / ECG/ urinalysis/ labs ordered  01/08/2021: Jerrell Belfast, NP (PCP) / Added z-pack/ labs ordered / discontinued probiotic per patient report    Recent consult visits:  03/14/2021: Dr. Lucilla Lame (Endocrinology) @ [facility] / Type 1 DM  02/01/2021: Dr. Ellouise Newer (Neurology) / No medication changes noted   01/17/2021: Dr. Christia Reading Misenheimer (GI) / GERD / no medication changes noted     Hospital visits:  01/10/2021:patient reported to ED with c/o dizziness/ CT scan head/breain w/o contast/ EKG obtained/ CT angio head/ MRI brain/ CT angio neck    01/06/2021: [facility]: No medication changes noted   Medications: Outpatient Encounter Medications as of 05/07/2021  Medication Sig Note   acyclovir (ZOVIRAX) 800 MG tablet TAKE 1 TABLET (800 MG) BY MOUTH 4 TIMES PER DAY FOR 3-7 DAYS AS NEEDED    amLODipine (NORVASC) 10 MG tablet TAKE 1 TABLET BY MOUTH EVERY DAY    aspirin EC 81 MG tablet Take 81 mg by mouth 2 (two) times a week.  01/22/2021: Off hold   buPROPion (WELLBUTRIN XL) 150 MG 24 hr tablet TAKE 1 TABLET BY MOUTH EVERY DAY    clopidogrel (PLAVIX) 75 MG tablet Take 1 tablet (75 mg total) by mouth daily.    gabapentin (NEURONTIN) 100 MG capsule Take 2 capsules (200 mg total) by mouth at bedtime.    insulin aspart (NOVOLOG) 100 UNIT/ML injection Inject 2-3 Units into the skin 3 (three) times daily before meals. Inject 6 units every am, 6 units at noon and 3 units at bedtime.      insulin glargine (LANTUS) 100 UNIT/ML injection Inject 0.15 mLs (15 Units total) into the skin every evening. Reported on 02/02/2016    levothyroxine (SYNTHROID) 137 MCG tablet Take 1 tablet (137 mcg total) by mouth daily before breakfast.    lisinopril-hydrochlorothiazide (ZESTORETIC) 10-12.5 MG tablet TAKE 1 TABLET BY MOUTH EVERY DAY    LORazepam (ATIVAN) 0.5 MG tablet TAKE 1 TABLET BY MOUTH EVERY DAY AS NEEDED FOR ANXIETY    metoCLOPramide (REGLAN) 10 MG tablet Take 1 tablet (10 mg total) by mouth daily.    Omega-3 Fatty Acids (FISH OIL PO) Take 1 capsule by mouth daily.    omeprazole (PRILOSEC) 40 MG capsule TAKE 1 CAPSULE BY MOUTH EVERY DAY    ondansetron (ZOFRAN) 4 MG tablet Take 1 tablet (4 mg total) by mouth every 8 (eight) hours as needed for nausea or vomiting.    ONETOUCH ULTRA test strip SMARTSIG:6 Via Meter 6 Times Daily (Patient not taking: No sig reported)    Rimegepant Sulfate (NURTEC) 75 MG TBDP Take 75 mg by mouth once as needed for up to 1 dose.    rosuvastatin (CRESTOR) 40 MG tablet Take 40 mg by mouth daily.    venlafaxine XR (EFFEXOR-XR) 150 MG 24 hr capsule TAKE 1 CAPSULE BY MOUTH EVERY DAY    Vitamin D, Ergocalciferol, (DRISDOL) 1.25 MG (50000 UNIT) CAPS capsule TAKE 1 CAPSULE BY MOUTH EACH WEEK  No facility-administered encounter medications on file as of 05/07/2021.    Lab Results  Component Value Date/Time   HGBA1C 8.5 (H) 04/17/2021 08:06 AM   HGBA1C 8.9 (H) 09/01/2020 12:20 PM     BP Readings from Last 3 Encounters:  01/22/21 (!) 135/56  01/16/21 130/60  01/08/21 106/60     Star Rating Drugs:  Medication:     Last Fill: Day Supply Lisinopril-HCTZ 10-12.5mg .   04/25/2021 90DS Rosuvastatin 40mg .     04/03/2021 [place], [place] Clinical Pharmacist Assistant

## 2021-05-11 DIAGNOSIS — E10319 Type 1 diabetes mellitus with unspecified diabetic retinopathy without macular edema: Secondary | ICD-10-CM | POA: Diagnosis not present

## 2021-05-11 DIAGNOSIS — E1065 Type 1 diabetes mellitus with hyperglycemia: Secondary | ICD-10-CM | POA: Diagnosis not present

## 2021-05-14 NOTE — Progress Notes (Deleted)
Chronic Care Management Pharmacy Note  05/14/2021 Name:  [name] MRN:  [id number] DOB:  [birth year]  Summary: ***  Recommendations/Changes made from today's visit: ***  Plan: ***   Subjective: [name] is an 70 y.o. year old female who is a primary patient of Cox, Kirsten, MD.  The CCM team was consulted for assistance with disease management and care coordination needs.    Engaged with patient by telephone for initial visit in response to provider referral for pharmacy case management and/or care coordination services.   Consent to Services:  The patient was given the following information about Chronic Care Management services today, agreed to services, and gave verbal consent: 1. CCM service includes personalized support from designated clinical staff supervised by the primary care provider, including individualized plan of care and coordination with other care providers 2. 24/7 contact phone numbers for assistance for urgent and routine care needs. 3. Service will only be billed when office clinical staff spend 20 minutes or more in a month to coordinate care. 4. Only one practitioner may furnish and bill the service in a calendar month. 5.The patient may stop CCM services at any time (effective at the end of the month) by phone call to the office staff. 6. The patient will be responsible for cost sharing (co-pay) of up to 20% of the service fee (after annual deductible is met). Patient agreed to services and consent obtained.  Patient Care Team: Rochel Brome, MD as PCP - General (Family Medicine) Cameron Sprang, MD as Consulting Physician (Neurology) Burnice Logan, [facility] as Pharmacist (Pharmacist)  Recent office visits:    01/16/2021: Dr. Tobie Poet (PCP)  / DM Type 1/ Refill ondasetron 4 mg. / increased Rosuvastatin 46m daily to 293m daily.   01/12/2021: Dr. CoTobie PoetPCP) / no medication changes noted   01/07/2021: Dr. RoUlanda EdisonFamily medicine) / ECG/ urinalysis/  labs ordered   01/08/2021: ShJerrell BelfastNP (PCP) / Added z-pack/ labs ordered / discontinued probiotic per patient report      Recent consult visits:  03/14/2021: Dr. AnLucilla LameEndocrinology) @ [facility] / Type 1 DM   02/01/2021: Dr. KaEllouise NewerNeurology) / No medication changes noted   01/17/2021: Dr. TiChristia Readingisenheimer (GI) / GERD / no medication changes noted        Hospital visits:  01/10/2021:patient reported to ED with c/o dizziness/ CT scan head/breain w/o contast/ EKG obtained/ CT angio head/ MRI brain/ CT angio neck     01/06/2021: [facility]: No medication changes noted    Objective:  Lab Results  Component Value Date   CREATININE 0.88 04/17/2021   BUN 20 04/17/2021   GFRNONAA 60 01/08/2021   GFRAA 69 01/08/2021   NA 136 04/17/2021   K 4.3 04/17/2021   CALCIUM 9.2 04/17/2021   CO2 23 04/17/2021   GLUCOSE 84 04/17/2021    Lab Results  Component Value Date/Time   HGBA1C 8.5 (H) 04/17/2021 08:06 AM   HGBA1C 8.9 (H) 09/01/2020 12:20 PM    Last diabetic Eye exam: No results found for: HMDIABEYEEXA  Last diabetic Foot exam: No results found for: HMDIABFOOTEX   Lab Results  Component Value Date   CHOL 245 (H) 04/17/2021   HDL 119 04/17/2021   LDLCALC 111 (H) 04/17/2021   TRIG 88 04/17/2021   CHOLHDL 2.1 04/17/2021    Hepatic Function Latest Ref Rng & Units 04/17/2021 01/08/2021 09/01/2020  Total Protein 6.0 - 8.5 g/dL 6.7 7.0 7.0  Albumin 3.7 - 4.7 g/dL 4.4  4.5 4.5  AST 0 - 40 IU/L 16 18 17   ALT 0 - 32 IU/L 16 13 13   Alk Phosphatase 44 - 121 IU/L 123(H) 109 114  Total Bilirubin 0.0 - 1.2 mg/dL <0.2 0.2 <0.2    Lab Results  Component Value Date/Time   TSH 13.000 (H) 04/17/2021 08:06 AM   TSH 0.779 09/01/2020 12:20 PM    CBC Latest Ref Rng & Units 04/17/2021 01/08/2021 09/01/2020  WBC 3.4 - 10.8 x10E3/uL 5.6 5.9 6.1  Hemoglobin 11.1 - 15.9 g/dL 12.6 10.6(L) 11.0(L)  Hematocrit 34.0 - 46.6 % 37.9 33.2(L) 34.5  Platelets 150 - 450 x10E3/uL 348 405 405     Lab Results  Component Value Date/Time   VD25OH 49.7 08/03/2019 06:47 AM    Clinical ASCVD: Yes  The ASCVD Risk score Mikey Bussing DC Jr., et al., 2013) failed to calculate for the following reasons:   The patient has a prior MI or stroke diagnosis    Depression screen [facility] 2/9 01/16/2021 09/01/2020 07/31/2020  Decreased Interest 0 1 3  Down, Depressed, Hopeless 0 1 3  PHQ - 2 Score 0 2 6  Altered sleeping - 1 1  Tired, decreased energy - 0 2  Change in appetite - 0 0  Feeling bad or failure about yourself  - 0 1  Trouble concentrating - 0 2  Moving slowly or fidgety/restless - 0 2  Suicidal thoughts - 0 0  PHQ-9 Score - 3 14  Difficult doing work/chores - Not difficult at all Somewhat difficult     Other: (CHADS2VASc if Afib, MMRC or CAT for COPD, ACT, DEXA)  Social History   Tobacco Use  Smoking Status Never  Smokeless Tobacco Never   BP Readings from Last 3 Encounters:  01/22/21 (!) 135/56  01/16/21 130/60  01/08/21 106/60   Pulse Readings from Last 3 Encounters:  01/22/21 90  01/16/21 84  01/08/21 90   Wt Readings from Last 3 Encounters:  01/22/21 142 lb (64.4 kg)  01/16/21 144 lb (65.3 kg)  01/08/21 145 lb (65.8 kg)   BMI Readings from Last 3 Encounters:  01/22/21 25.97 kg/m  01/16/21 26.34 kg/m  01/08/21 26.52 kg/m    Assessment/Interventions: Review of patient past medical history, allergies, medications, health status, including review of consultants reports, laboratory and other test data, was performed as part of comprehensive evaluation and provision of chronic care management services.   SDOH:  (Social Determinants of Health) assessments and interventions performed: Yes  SDOH Screenings   Alcohol Screen: Not on file  Depression (PHQ2-9): Low Risk    PHQ-2 Score: 0  Financial Resource Strain: Not on file  Food Insecurity: Not on file  Housing: Not on file  Physical Activity: Not on file  Social Connections: Not on file  Stress: Not on file   Tobacco Use: Low Risk    Smoking Tobacco Use: Never   Smokeless Tobacco Use: Never  Transportation Needs: Not on file    [place]  No Known Allergies  Medications Reviewed Today     Reviewed by Cameron Sprang, MD (Physician) on 01/22/21 at [place] List Status: <None>   Medication Order Taking? Sig Documenting Provider Last Dose Status Informant  acyclovir (ZOVIRAX) 800 MG tablet [id number] Yes TAKE 1 TABLET (800 MG) BY MOUTH 4 TIMES PER DAY FOR 3-7 DAYS AS NEEDED Marge Duncans, PA-C Taking Active   amLODipine (NORVASC) 10 MG tablet [id number] Yes TAKE 1 TABLET BY MOUTH EVERY DAY Cox, Kirsten, MD  Taking Active   aspirin EC 81 MG tablet [id number] Yes Take 81 mg by mouth 2 (two) times a week.  [provider] Taking Active Self           Med Note Lynelle Doctor Jan 22, 2021  3:13 PM) Off hold  buPROPion (WELLBUTRIN XL) 150 MG 24 hr tablet [id number] Yes TAKE 1 TABLET BY MOUTH EVERY DAY Marge Duncans, PA-C Taking Active   clopidogrel (PLAVIX) 75 MG tablet [id number] Yes Take 1 tablet (75 mg total) by mouth daily. Cox, Kirsten, MD Taking Active   insulin aspart (NOVOLOG) 100 UNIT/ML injection [id number] Yes Inject 2-3 Units into the skin 3 (three) times daily before meals. Sliding scale [provider] Taking Active Self  insulin glargine (LANTUS) 100 UNIT/ML injection [id number] Yes Inject 14 Units into the skin every evening. Reported on 02/02/2016 [provider] Taking Active Self           Med Note Lynelle Doctor Jan 22, 2021  3:14 PM) 13 units  levothyroxine (SYNTHROID, LEVOTHROID) 125 MCG tablet [id number] Yes Take 125 mcg by mouth daily before breakfast. [provider] Taking Active Self  lisinopril-hydrochlorothiazide (ZESTORETIC) 10-12.5 MG tablet [id number] Yes TAKE 1 TABLET BY MOUTH EVERY DAY Cox, Kirsten, MD Taking Active   LORazepam (ATIVAN) 0.5 MG tablet [id number] Yes TAKE 1 TABLET (0.5 MG) BY MOUTH 1 TIMES PER DAY AS NEEDED FOR  ANXIETY Cox, Kirsten, MD Taking Active   metoCLOPramide (REGLAN) 10 MG tablet [id number] Yes Take 1 tablet (10 mg total) by mouth daily. Jackquline Denmark, MD Taking Active Self  Omega-3 Fatty Acids (FISH OIL PO) [id number] Yes Take 1 capsule by mouth daily. [provider] Taking Active Self  omeprazole (PRILOSEC) 40 MG capsule [id number] Yes TAKE 1 CAPSULE BY MOUTH EVERY DAY Cox, Kirsten, MD Taking Active   ondansetron (ZOFRAN) 4 MG tablet [id number] Yes Take 1 tablet (4 mg total) by mouth every 8 (eight) hours as needed for nausea or vomiting. Lillard Anes, MD Taking Active   [facility] ULTRA test strip [id number] No SMARTSIG:6 Via Meter 6 Times Daily  Patient not taking: No sig reported   [provider] Not Taking Active   Rimegepant Sulfate (NURTEC) 75 MG TBDP [id number] Yes Take 75 mg by mouth once as needed for up to 1 dose. Lillard Anes, MD Taking Active   rosuvastatin (CRESTOR) 20 MG tablet [id number] Yes Take 1 tablet (20 mg total) by mouth daily. Cox, Kirsten, MD Taking Active            Med Note Lynelle Doctor Jan 22, 2021  3:15 PM) Taken 40 mg  venlafaxine XR (EFFEXOR-XR) 150 MG 24 hr capsule [id number] Yes TAKE 1 CAPSULE BY MOUTH EVERY DAY Cox, Kirsten, MD Taking Active   Vitamin D, Ergocalciferol, (DRISDOL) 1.25 MG (50000 UNIT) CAPS capsule [id number] Yes TAKE 1 CAPSULE BY MOUTH EACH Norton Pastel, MD Taking Active             Patient Active Problem List   Diagnosis Date Noted   Well adult exam 07/03/2020   Left tibial fracture 08/03/2019   Capsulitis of metatarsophalangeal (MTP) joint of left foot 01/22/2018   Memory changes 07/04/2016   Asthma 08/14/2015   Laryngopharyngeal reflux (LPR) 08/14/2015   Rhinitis 08/14/2015   Acute respiratory failure with hypoxemia (HCC) 04/20/2014   Encephalopathy acute 04/20/2014   Primary localized osteoarthrosis, lower leg 04/18/2014   Essential hypertension  04/15/2014   Type 2 diabetes mellitus  without complication ([place]) 98/26/4158    Immunization History  Administered Date(s) Administered   Fluad Quad(high Dose 65+) 07/31/2020   Hepatitis B 05/01/2015, 11/28/2015   Influenza, Quadrivalent, Recombinant, Inj, Pf 09/03/2019   Influenza,inj,Quad PF,6+ Mos 07/23/2018   Influenza-Unspecified 10/22/2004, 09/02/2012, 09/02/2013, 08/10/2014   Moderna Sars-Covid-2 Vaccination 12/24/2019, 01/21/2020   PFIZER(Purple Top)SARS-COV-2 Vaccination 09/11/2020, 02/16/2021   PPD Test 07/03/2020   Pneumococcal Conjugate-13 05/01/2015, 12/25/2018   Pneumococcal Polysaccharide-23 07/03/2020   Pneumococcal-Unspecified 07/03/2020   Td 05/01/2015   Tdap 05/01/2015, 12/25/2018   Zoster Recombinat (Shingrix) 07/02/2020, 10/18/2020    Conditions to be addressed/monitored:  Hypertension, Hyperlipidemia, Diabetes, and Hypothyroidism  There are no care plans that you recently modified to display for this patient.    Medication Assistance: {MEDASSISTANCEINFO:25044}  Compliance/Adherence/Medication fill history: Care Gaps: ***  Star-Rating Drugs: ***  Patient's [facility] pharmacy [facility]:  [id number] [id number]: [phone]: [phone] Uses pill box? {Yes or If no, why not?:20788} Pt endorses ***% compliance   We discussed: Benefits of medication synchronization, packaging and delivery as well as enhanced pharmacist oversight with [facility]. Patient decided to: Utilize [facility] pharmacy for medication synchronization, packaging and delivery  Care Plan and Follow Up Patient Decision:  Patient agrees to Care Plan and Follow-up.  Plan: Telephone follow up appointment with care management team member scheduled for:  ***  ***     Current Barriers:  {pharmacybarriers:24917}  Pharmacist Clinical Goal(s):  Patient will {PHARMACYGOALCHOICES:24921} through collaboration with PharmD and  provider.   Interventions: 1:1 collaboration with Cox, KElnita Maxwell MD regarding development and update of comprehensive plan of care as evidenced by provider attestation and co-signature Inter-disciplinary care team collaboration (see longitudinal plan of care) Comprehensive medication review performed; medication list updated in electronic medical record  Hypertension (BP goal <130/80) -{US controlled/uncontrolled:25276} -Current treatment: Amlodipine 10 mg daily  Lisinopril-hydrochlorothiazide 10-12.5 mg daily  -Medications previously tried: ***  -Current home readings: *** -Current dietary habits: *** -Current exercise habits: *** -{ACTIONS;DENIES/REPORTS:21021675} hypotensive/hypertensive symptoms -Educated on {CCM BP Counseling:25124} -Counseled to monitor BP at home ***, document, and provide log at future appointments -{CCMPHARMDINTERVENTION:25122}  Hyperlipidemia: (LDL goal < ***) -{US controlled/uncontrolled:25276} -Current treatment: Rosuvastatin 40 mg daily  Aspirin ec 81 mg twice weekly  Clopidogrel 75 mg daily  Omega-3 fatty acids daily  -Medications previously tried: ***  -Current dietary patterns: *** -Current exercise habits: *** -Educated on {CCM HLD Counseling:25126} -{CCMPHARMDINTERVENTION:25122}  *** (Goal: ***) -{US controlled/uncontrolled:25276} -Current treatment  Gabapentin 100 mg 2 capsules daily at bedtime  -Medications previously tried: ***  -{CCMPHARMDINTERVENTION:25122}  Diabetes (A1c goal {A1c goals:23924}) -{US controlled/uncontrolled:25276} -Current medications: Novolog 6/6/3 units before meal Lanbuts 15 unis every evening -Medications previously tried: ***  -Current home glucose readings fasting glucose: *** post prandial glucose: *** -{ACTIONS;DENIES/REPORTS:21021675} hypoglycemic/hyperglycemic symptoms -Current meal patterns:  breakfast: ***  lunch: ***  dinner: *** snacks: *** drinks: *** -Current exercise: *** -Educated on  {CCM DM COUNSELING:25123} -Counseled to check feet daily and get yearly eye exams -{CCMPHARMDINTERVENTION:25122}  *** (Goal: ***) -{US controlled/uncontrolled:25276} -Current treatment  Metoclopramide 10 mg daily  Omeprazole 40 mg daily  Ondansetron 4 mg every 8 hours prn nausea/vomiting -Medications previously tried: ***  -{CCMPHARMDINTERVENTION:25122}   *** (Goal: ***) -{US controlled/uncontrolled:25276} -Current treatment  ***levothyroxine 137 mcg daily before breakfast -Medications previously tried: ***  -{CCMPHARMDINTERVENTION:25122}   Osteoporosis / Osteopenia (Goal ***) -{US  controlled/uncontrolled:25276} -Last DEXA Scan: ***   T-Score femoral neck: ***  T-Score total hip: ***  T-Score lumbar spine: ***  T-Score forearm radius: ***  10-year probability of major osteoporotic fracture: ***  10-year probability of hip fracture: *** -Patient {is;is not an osteoporosis candidate:23886} -Current treatment  Vitamin d 50,000 units weekly  -Medications previously tried: ***  -{Osteoporosis Counseling:23892} -{CCMPHARMDINTERVENTION:25122}  Depression/Anxiety (Goal: ***) -{US controlled/uncontrolled:25276} -Current treatment: Bupropion xl 150 mg daily  Lorazepam 0.5 mg daily prn anxiety  Venlafaxine xr 150 mg daily  -Medications previously tried/failed: *** -PHQ9: *** -GAD7: *** -Connected with *** for mental health support -Educated on {CCM mental health counseling:25127} -{CCMPHARMDINTERVENTION:25122}   *** (Goal: ***) -{US controlled/uncontrolled:25276} -Current treatment  Nurtec 75 mg daily prn  -Medications previously tried: ***  -{CCMPHARMDINTERVENTION:25122}  Health Maintenance -Vaccine gaps: *** -Current therapy:  ***acyclovir 800 mg qid for 3-7 days prn  -Educated on {ccm supplement counseling:25128} -{CCM Patient satisfied:25129} -{CCMPHARMDINTERVENTION:25122}  Patient Goals/Self-Care Activities Patient will:  -  {pharmacypatientgoals:24919}  Follow Up Plan: {CM FOLLOW UP PBAQ:56720}

## 2021-05-15 ENCOUNTER — Telehealth: Payer: Self-pay

## 2021-05-15 ENCOUNTER — Telehealth: Payer: PPO

## 2021-05-15 NOTE — Telephone Encounter (Signed)
  Care Management   Follow Up Note   05/15/2021 Name: [name] MRN: [id number] DOB: [birth year]   Referred by: Rochel Brome, MD Reason for referral : Chronic Care Management (Unsuccessful initial ccm outreach )   A second unsuccessful telephone outreach was attempted today. The patient was referred to the case management team for assistance with care management and care coordination.   Follow Up Plan: If patient returns call to provider office, please advise to call [place]* at [phone] to reschedule initial ccm*  Sherre Poot, PharmD, [facility] Clinical Pharmacist [place] [phone] (office) [phone] (mobile)

## 2021-05-18 ENCOUNTER — Other Ambulatory Visit: Payer: Self-pay | Admitting: Family Medicine

## 2021-06-05 ENCOUNTER — Ambulatory Visit (INDEPENDENT_AMBULATORY_CARE_PROVIDER_SITE_OTHER): Payer: PPO | Admitting: Family Medicine

## 2021-06-05 ENCOUNTER — Other Ambulatory Visit: Payer: Self-pay

## 2021-06-05 ENCOUNTER — Encounter: Payer: Self-pay | Admitting: Family Medicine

## 2021-06-05 VITALS — BP 120/60 | HR 84 | Temp 96.9°F | Resp 14 | Ht 65.0 in | Wt 150.0 lb

## 2021-06-05 DIAGNOSIS — I10 Essential (primary) hypertension: Secondary | ICD-10-CM

## 2021-06-05 DIAGNOSIS — M25512 Pain in left shoulder: Secondary | ICD-10-CM

## 2021-06-05 DIAGNOSIS — E782 Mixed hyperlipidemia: Secondary | ICD-10-CM | POA: Diagnosis not present

## 2021-06-05 DIAGNOSIS — E039 Hypothyroidism, unspecified: Secondary | ICD-10-CM | POA: Diagnosis not present

## 2021-06-05 DIAGNOSIS — M25511 Pain in right shoulder: Secondary | ICD-10-CM | POA: Diagnosis not present

## 2021-06-05 DIAGNOSIS — E1065 Type 1 diabetes mellitus with hyperglycemia: Secondary | ICD-10-CM

## 2021-06-05 NOTE — Progress Notes (Signed)
Subjective:  Patient ID: [name], female    DOB: 05/15/1951  Age: 70 y.o. MRN: [id number]  Chief Complaint  Patient presents with   Diabetes   Hypertension   Hypothyroidism    HPI DM, type 1 complicated by neuropathy: pt is seeing endocrinology. Sugars 50 -290. She is using a CGM now, but is also looking into an insulin pump. She does have low sugars which is why endocrinology is managing her. Her insulin was recently adjusted.  Hypertension: ON lisinopril/hctz 10/12.5 mg once daily.  Depression is well controlled with effexor xr. 150 mg once daily.  Hyperlipidemia: tolerating crestor 40 mg once daily and fish oil.  Hypothyroidism: pt is here for recheck of tsh. Synthroid is 137 mcg once daily in am.   Current Outpatient Medications on File Prior to Visit  Medication Sig Dispense Refill   acyclovir (ZOVIRAX) 800 MG tablet TAKE 1 TABLET (800 MG) BY MOUTH 4 TIMES PER DAY FOR 3-7 DAYS AS NEEDED 30 tablet 2   amLODipine (NORVASC) 10 MG tablet TAKE 1 TABLET BY MOUTH EVERY DAY 90 tablet 3   aspirin EC 81 MG tablet Take 81 mg by mouth 2 (two) times a week.      buPROPion (WELLBUTRIN XL) 150 MG 24 hr tablet TAKE 1 TABLET BY MOUTH EVERY DAY 90 tablet 3   clopidogrel (PLAVIX) 75 MG tablet Take 1 tablet (75 mg total) by mouth daily. 14 tablet 0   gabapentin (NEURONTIN) 100 MG capsule Take 2 capsules (200 mg total) by mouth at bedtime. 180 capsule 1   insulin aspart (NOVOLOG) 100 UNIT/ML injection Inject 2-3 Units into the skin 3 (three) times daily before meals. Inject 6 units every am, 6 units at noon and 3 units at bedtime.      insulin glargine (LANTUS) 100 UNIT/ML injection Inject 0.15 mLs (15 Units total) into the skin every evening. Reported on 02/02/2016 10 mL 1   levothyroxine (SYNTHROID) 137 MCG tablet TAKE 1 TABLET BY MOUTH DAILY BEFORE BREAKFAST. 30 tablet 1   lisinopril-hydrochlorothiazide (ZESTORETIC) 10-12.5 MG tablet TAKE 1 TABLET BY MOUTH EVERY DAY 90 tablet 1   LORazepam  (ATIVAN) 0.5 MG tablet TAKE 1 TABLET BY MOUTH EVERY DAY AS NEEDED FOR ANXIETY 30 tablet 3   metoCLOPramide (REGLAN) 10 MG tablet Take 1 tablet (10 mg total) by mouth daily. 30 tablet 0   Omega-3 Fatty Acids (FISH OIL PO) Take 1 capsule by mouth daily.     omeprazole (PRILOSEC) 40 MG capsule TAKE 1 CAPSULE BY MOUTH EVERY DAY 90 capsule 3   ondansetron (ZOFRAN) 4 MG tablet Take 1 tablet (4 mg total) by mouth every 8 (eight) hours as needed for nausea or vomiting. 20 tablet 0   ONETOUCH ULTRA test strip SMARTSIG:6 Via Meter 6 Times Daily (Patient not taking: No sig reported)     Rimegepant Sulfate (NURTEC) 75 MG TBDP Take 75 mg by mouth once as needed for up to 1 dose. 30 tablet 0   rosuvastatin (CRESTOR) 40 MG tablet Take 40 mg by mouth daily.     venlafaxine XR (EFFEXOR-XR) 150 MG 24 hr capsule TAKE 1 CAPSULE BY MOUTH EVERY DAY 90 capsule 2   Vitamin D, Ergocalciferol, (DRISDOL) 1.25 MG (50000 UNIT) CAPS capsule TAKE 1 CAPSULE BY MOUTH EACH WEEK 12 capsule 3   No current facility-administered medications on file prior to visit.   Past Medical History:  Diagnosis Date   Depression    Diabetes mellitus without complication ([place])  GERD (gastroesophageal reflux disease)    Hypertension    Hypothyroidism    Stroke ([place])    Type 1 diabetes [facility])    Past Surgical History:  Procedure Laterality Date   APPENDECTOMY     APPLICATION OF WOUND VAC Left 08/03/2019   Procedure: Application Of Wound Vac;  Surgeon: Altamese [place]ee, MD;  Location: [place];  Service: Orthopedics;  Laterality: Left;   CESAREAN SECTION     ESOPHAGOGASTRODUODENOSCOPY (EGD) WITH PROPOFOL N/A 02/02/2016   Procedure: ESOPHAGOGASTRODUODENOSCOPY (EGD) WITH PROPOFOL;  Surgeon: Josefine Class, MD;  [facility] ENDOSCOPY;  Service: Endoscopy;  Laterality: N/A;   HAND SURGERY     JOINT REPLACEMENT Bilateral    Knee    ORIF ANKLE FRACTURE Left 08/03/2019   with wound vac applied    ORIF ANKLE FRACTURE Left 08/03/2019    Procedure: OPEN REDUCTION INTERNAL FIXATION (ORIF) ANKLE FRACTURE;  Surgeon: Altamese [place], MD;  Location[place];  Service: Orthopedics;  Laterality: Left;   TIBIALIS TENDON TRANSFER / REPAIR      Family History  Problem Relation Age of Onset   Cancer Mother        Colon cancer    Dementia Father    Neuropathy Father    Social History   Socioeconomic History   Marital status: Single    Spouse name: Not on file   Number of children: Not on file   Years of education: Not on file   Highest education level: Not on file  Occupational History   Not on file  Tobacco Use   Smoking status: Never   Smokeless tobacco: Never  Vaping Use   Vaping Use: Never used  Substance and Sexual Activity   Alcohol use: Yes    Alcohol/week: 1.0 standard drink    Types: 1 Glasses of wine per week    Comment: everyday   Drug use: Never   Sexual activity: Not Currently  Other Topics Concern   Not on file  Social History Narrative   Right hand   Lives alone   Social Determinants of Health   Financial Resource Strain: Not on file  Food Insecurity: Not on file  Transportation Needs: Not on file  Physical Activity: Not on file  Stress: Not on file  Social Connections: Not on file    Review of Systems  Constitutional:  Negative for chills, fatigue and fever.  HENT:  Negative for congestion, rhinorrhea and sore throat.   Respiratory:  Negative for cough and shortness of breath.   Cardiovascular:  Negative for chest pain.  Gastrointestinal:  Negative for abdominal pain, constipation, diarrhea, nausea and vomiting.  Genitourinary:  Negative for dysuria and urgency.       Poor bladder control  Musculoskeletal:  Positive for arthralgias (bilateral shoulder pain, left worse than right shoulder). Negative for back pain and myalgias.  Neurological:  Negative for dizziness, weakness, light-headedness and headaches.  Psychiatric/Behavioral:  Negative for dysphoric mood. The patient is not  nervous/anxious.     Objective:  BP 120/60   Pulse 84   Temp (!) 96.9 F (36.1 C)   Resp 14   Ht 5\' 5"  (1.651 m)   Wt 150 lb (68 kg)   BMI 24.96 kg/m   BP/Weight 06/05/2021 12/23/2351 04/18/4430  Systolic BP 540 086 761  Diastolic BP 60 56 60  Wt. (Lbs) 150 142 144  BMI 24.96 25.97 26.34    Physical Exam Vitals reviewed.  Constitutional:      Appearance: Normal appearance.  She is normal weight.  Neck:     Vascular: No carotid bruit.  Cardiovascular:     Rate and Rhythm: Normal rate and regular rhythm.     Pulses: Normal pulses.     Heart sounds: Normal heart sounds.  Pulmonary:     Effort: Pulmonary effort is normal. No respiratory distress.     Breath sounds: Normal breath sounds.  Abdominal:     General: Abdomen is flat. Bowel sounds are normal.     Palpations: Abdomen is soft.     Tenderness: There is no abdominal tenderness.  Musculoskeletal:        General: Tenderness (BL shoulders anteriorly and posteriorly. Poor ROM. abduction is limited BL as is extension lf arms. internal rotation elicits significant discomfort.) present.  Neurological:     Mental Status: She is alert and oriented to person, place, and time.  Psychiatric:        Mood and Affect: Mood normal.        Behavior: Behavior normal.    Diabetic Foot Exam - Simple   No data filed      Lab Results  Component Value Date   WBC 5.6 04/17/2021   HGB 12.6 04/17/2021   HCT 37.9 04/17/2021   PLT 348 04/17/2021   GLUCOSE 84 04/17/2021   CHOL 245 (H) 04/17/2021   TRIG 88 04/17/2021   HDL 119 04/17/2021   LDLCALC 111 (H) 04/17/2021   ALT 16 04/17/2021   AST 16 04/17/2021   NA 136 04/17/2021   K 4.3 04/17/2021   CL 95 (L) 04/17/2021   CREATININE 0.88 04/17/2021   BUN 20 04/17/2021   CO2 23 04/17/2021   TSH 0.707 06/05/2021   INR 0.9 08/03/2019   HGBA1C 8.5 (H) 04/17/2021      Assessment & Plan:   1. Acquired hypothyroidism Lab came back good.  The current medical regimen is effective;   continue present plan and medications. - TSH  2. Type 1 diabetes mellitus with hyperglycemia ([place]) Management per specialist.   3. Essential hypertension The current medical regimen is effective;  continue present plan and medications.  4. Mixed hyperlipidemia Follow up in 3 months fasting.  Recommend continue to work on eating healthy diet and exercise. Fair control.   5. Acute on chronic pain of both shoulders  Recommended voltaren gel.  Call orthopedics.   Orders Placed This Encounter  Procedures   TSH     Follow-up: No follow-ups on file.  An After Visit Summary was printed and given to the patient.  Rochel Brome, MD [facility] [phone]

## 2021-06-05 NOTE — Patient Instructions (Signed)
Diclofenac gel (voltaren.)

## 2021-06-06 LAB — TSH: TSH: 0.707 u[IU]/mL (ref 0.450–4.500)

## 2021-06-07 ENCOUNTER — Encounter: Payer: Self-pay | Admitting: Family Medicine

## 2021-06-08 ENCOUNTER — Other Ambulatory Visit: Payer: Self-pay | Admitting: Physician Assistant

## 2021-06-10 DIAGNOSIS — E10319 Type 1 diabetes mellitus with unspecified diabetic retinopathy without macular edema: Secondary | ICD-10-CM | POA: Diagnosis not present

## 2021-06-10 DIAGNOSIS — E1065 Type 1 diabetes mellitus with hyperglycemia: Secondary | ICD-10-CM | POA: Diagnosis not present

## 2021-06-13 ENCOUNTER — Other Ambulatory Visit: Payer: Self-pay | Admitting: Family Medicine

## 2021-06-13 DIAGNOSIS — G8929 Other chronic pain: Secondary | ICD-10-CM | POA: Diagnosis not present

## 2021-06-13 DIAGNOSIS — M25512 Pain in left shoulder: Secondary | ICD-10-CM | POA: Diagnosis not present

## 2021-06-18 DIAGNOSIS — E782 Mixed hyperlipidemia: Secondary | ICD-10-CM | POA: Diagnosis not present

## 2021-06-18 DIAGNOSIS — E039 Hypothyroidism, unspecified: Secondary | ICD-10-CM | POA: Diagnosis not present

## 2021-06-18 DIAGNOSIS — E1065 Type 1 diabetes mellitus with hyperglycemia: Secondary | ICD-10-CM | POA: Diagnosis not present

## 2021-06-18 DIAGNOSIS — E10319 Type 1 diabetes mellitus with unspecified diabetic retinopathy without macular edema: Secondary | ICD-10-CM | POA: Diagnosis not present

## 2021-06-28 ENCOUNTER — Other Ambulatory Visit: Payer: Self-pay | Admitting: Physician Assistant

## 2021-06-28 DIAGNOSIS — K449 Diaphragmatic hernia without obstruction or gangrene: Secondary | ICD-10-CM | POA: Diagnosis not present

## 2021-06-28 DIAGNOSIS — R42 Dizziness and giddiness: Secondary | ICD-10-CM | POA: Diagnosis not present

## 2021-06-28 DIAGNOSIS — Z794 Long term (current) use of insulin: Secondary | ICD-10-CM | POA: Diagnosis not present

## 2021-06-28 DIAGNOSIS — R531 Weakness: Secondary | ICD-10-CM | POA: Diagnosis not present

## 2021-06-28 DIAGNOSIS — I639 Cerebral infarction, unspecified: Secondary | ICD-10-CM | POA: Diagnosis not present

## 2021-06-28 DIAGNOSIS — Z8673 Personal history of transient ischemic attack (TIA), and cerebral infarction without residual deficits: Secondary | ICD-10-CM | POA: Diagnosis not present

## 2021-06-28 DIAGNOSIS — E1065 Type 1 diabetes mellitus with hyperglycemia: Secondary | ICD-10-CM | POA: Diagnosis not present

## 2021-07-10 DIAGNOSIS — K449 Diaphragmatic hernia without obstruction or gangrene: Secondary | ICD-10-CM | POA: Diagnosis not present

## 2021-07-10 DIAGNOSIS — Z20822 Contact with and (suspected) exposure to covid-19: Secondary | ICD-10-CM | POA: Diagnosis not present

## 2021-07-10 DIAGNOSIS — K429 Umbilical hernia without obstruction or gangrene: Secondary | ICD-10-CM | POA: Diagnosis not present

## 2021-07-10 DIAGNOSIS — R519 Headache, unspecified: Secondary | ICD-10-CM | POA: Diagnosis not present

## 2021-07-10 DIAGNOSIS — B349 Viral infection, unspecified: Secondary | ICD-10-CM | POA: Diagnosis not present

## 2021-07-10 DIAGNOSIS — R1032 Left lower quadrant pain: Secondary | ICD-10-CM | POA: Diagnosis not present

## 2021-07-11 DIAGNOSIS — E1065 Type 1 diabetes mellitus with hyperglycemia: Secondary | ICD-10-CM | POA: Diagnosis not present

## 2021-07-11 DIAGNOSIS — E10319 Type 1 diabetes mellitus with unspecified diabetic retinopathy without macular edema: Secondary | ICD-10-CM | POA: Diagnosis not present

## 2021-07-29 DIAGNOSIS — Z23 Encounter for immunization: Secondary | ICD-10-CM | POA: Diagnosis not present

## 2021-07-29 DIAGNOSIS — S92001A Unspecified fracture of right calcaneus, initial encounter for closed fracture: Secondary | ICD-10-CM | POA: Diagnosis not present

## 2021-07-31 ENCOUNTER — Encounter: Payer: Self-pay | Admitting: Sports Medicine

## 2021-07-31 ENCOUNTER — Ambulatory Visit: Payer: PPO | Admitting: Sports Medicine

## 2021-07-31 ENCOUNTER — Other Ambulatory Visit: Payer: Self-pay

## 2021-07-31 DIAGNOSIS — M79671 Pain in right foot: Secondary | ICD-10-CM

## 2021-07-31 DIAGNOSIS — R58 Hemorrhage, not elsewhere classified: Secondary | ICD-10-CM | POA: Diagnosis not present

## 2021-07-31 DIAGNOSIS — S92001A Unspecified fracture of right calcaneus, initial encounter for closed fracture: Secondary | ICD-10-CM | POA: Diagnosis not present

## 2021-07-31 NOTE — Progress Notes (Signed)
Subjective: [name] is a 70 y.o. female patient who presents to office for evaluation of Right foot/heel pain. Patient complains of progressive pain especially after she fell 4-5 ft and landed on the heel on Sunday. No pain at rest but with trying to move around its 10/10. Went to [place] ER on 9/11 and Norco and splint. Patient denies any other pedal complaints.   Patient Active Problem List   Diagnosis Date Noted   CVA (cerebral vascular accident) ([place]) 12/19/2020   Well adult exam 07/03/2020   Left tibial fracture 08/03/2019   Capsulitis of metatarsophalangeal (MTP) joint of left foot 01/22/2018   Memory changes 07/04/2016   Asthma 08/14/2015   Laryngopharyngeal reflux (LPR) 08/14/2015   Rhinitis 08/14/2015   Acute respiratory failure with hypoxemia ([place]) 04/20/2014   Encephalopathy acute 04/20/2014   Primary localized osteoarthrosis, lower leg 04/18/2014   Essential hypertension 04/15/2014   Type 2 diabetes mellitus without complication ([place]) [id number]    Current Outpatient Medications on File Prior to Visit  Medication Sig Dispense Refill   levothyroxine (SYNTHROID) 125 MCG tablet Take 1 tablet by mouth daily.     acyclovir (ZOVIRAX) 800 MG tablet TAKE 1 TABLET (800 MG) BY MOUTH 4 TIMES PER DAY FOR 3-7 DAYS AS NEEDED 30 tablet 2   amLODipine (NORVASC) 10 MG tablet TAKE 1 TABLET BY MOUTH EVERY DAY 90 tablet 3   aspirin EC 81 MG tablet Take 81 mg by mouth 2 (two) times a week.      buPROPion (WELLBUTRIN XL) 150 MG 24 hr tablet TAKE 1 TABLET BY MOUTH EVERY DAY 90 tablet 3   clopidogrel (PLAVIX) 75 MG tablet Take 1 tablet (75 mg total) by mouth daily. 14 tablet 0   gabapentin (NEURONTIN) 100 MG capsule Take 2 capsules (200 mg total) by mouth at bedtime. 180 capsule 1   GABAPENTIN PO Take 2 capsules by mouth daily.     HYDROcodone-acetaminophen (NORCO/VICODIN) 5-325 MG tablet Take 1 tablet by mouth every 6 (six) hours as needed.     insulin aspart (NOVOLOG) 100 UNIT/ML  injection Inject 2-3 Units into the skin 3 (three) times daily before meals. Inject 6 units every am, 6 units at noon and 3 units at bedtime.      insulin glargine (LANTUS) 100 UNIT/ML injection Inject 0.15 mLs (15 Units total) into the skin every evening. Reported on 02/02/2016 10 mL 1   levothyroxine (SYNTHROID) 137 MCG tablet TAKE 1 TABLET BY MOUTH EVERY DAY BEFORE BREAKFAST 30 tablet 1   lisinopril-hydrochlorothiazide (ZESTORETIC) 10-12.5 MG tablet TAKE 1 TABLET BY MOUTH EVERY DAY 90 tablet 1   LORazepam (ATIVAN) 0.5 MG tablet TAKE 1 TABLET BY MOUTH EVERY DAY AS NEEDED FOR ANXIETY 30 tablet 3   meloxicam (MOBIC) 15 MG tablet Take 15 mg by mouth daily.     metoCLOPramide (REGLAN) 10 MG tablet Take 1 tablet (10 mg total) by mouth daily. 30 tablet 0   Omega-3 Fatty Acids (FISH OIL PO) Take 1 capsule by mouth daily.     omeprazole (PRILOSEC) 40 MG capsule TAKE 1 CAPSULE BY MOUTH EVERY DAY 90 capsule 3   ondansetron (ZOFRAN) 4 MG tablet Take 1 tablet (4 mg total) by mouth every 8 (eight) hours as needed for nausea or vomiting. 20 tablet 0   ONETOUCH ULTRA test strip SMARTSIG:6 Via Meter 6 Times Daily (Patient not taking: No sig reported)     oxyCODONE (OXY IR/ROXICODONE) 5 MG immediate release tablet SMARTSIG:5 Milligram(s) By Mouth Every 6 Hours  PRN     Rimegepant Sulfate (NURTEC) 75 MG TBDP Take 75 mg by mouth once as needed for up to 1 dose. 30 tablet 0   rosuvastatin (CRESTOR) 40 MG tablet Take 40 mg by mouth daily.     venlafaxine XR (EFFEXOR-XR) 150 MG 24 hr capsule TAKE 1 CAPSULE BY MOUTH EVERY DAY 90 capsule 2   Vitamin D, Ergocalciferol, (DRISDOL) 1.25 MG (50000 UNIT) CAPS capsule TAKE 1 CAPSULE BY MOUTH EACH WEEK 12 capsule 3   No current facility-administered medications on file prior to visit.    No Known Allergies  Objective:  General: Alert and oriented x3 in no acute distress  Dermatology: No open lesions bilateral lower extremities, no webspace macerations, + ecchymosis, all  nails x 10 are well manicured.  Vascular: Focal edema noted to right foot. Dorsalis Pedis and Posterior Tibial pedal pulses 1/4, Capillary Fill Time 3 seconds,(+) pedal hair growth bilateral, Temperature gradient within normal limits.  Neurology: Johney Maine sensation intact via light touch bilateral, Protective sensation intact  with Semmes Weinstein Monofilament to all pedal sites, Deep tendon reflexes within normal limits bilateral, No babinski sign present bilateral.  Musculoskeletal: There is tenderness with palpation at Right heel, There pain to right heel, no calf compression bilateral, Strength within normal limits in all groups bilateral except on the right.   Assessment and Plan: Problem List Items Addressed This Visit   None Visit Diagnoses     Closed nondisplaced fracture of right calcaneus, unspecified portion of calcaneus, initial encounter    -  Primary   Pain of right heel       Ecchymosis           -Complete examination performed -Xrays reviewed from [facility]  -Discussed treatment options for fracture; risks, alternatives, and benefits explained. -Production assistant, radio and posterior splint  -Continue with nonweightbearing with use of knee scooter -Recommend protection, rest, ice, elevation daily until symptoms improve -Continue with pain meds -Patient to return to office in 2 weeks for serial x-rays to assess healing  or sooner if condition worsens.  Landis Martins, DPM

## 2021-08-11 DIAGNOSIS — E10319 Type 1 diabetes mellitus with unspecified diabetic retinopathy without macular edema: Secondary | ICD-10-CM | POA: Diagnosis not present

## 2021-08-11 DIAGNOSIS — E1065 Type 1 diabetes mellitus with hyperglycemia: Secondary | ICD-10-CM | POA: Diagnosis not present

## 2021-08-15 ENCOUNTER — Other Ambulatory Visit: Payer: Self-pay | Admitting: Physician Assistant

## 2021-08-15 ENCOUNTER — Ambulatory Visit (INDEPENDENT_AMBULATORY_CARE_PROVIDER_SITE_OTHER): Payer: PPO

## 2021-08-15 ENCOUNTER — Other Ambulatory Visit: Payer: Self-pay

## 2021-08-15 ENCOUNTER — Ambulatory Visit: Payer: PPO | Admitting: Sports Medicine

## 2021-08-15 ENCOUNTER — Encounter: Payer: Self-pay | Admitting: Sports Medicine

## 2021-08-15 DIAGNOSIS — S92001A Unspecified fracture of right calcaneus, initial encounter for closed fracture: Secondary | ICD-10-CM | POA: Diagnosis not present

## 2021-08-15 DIAGNOSIS — S92001D Unspecified fracture of right calcaneus, subsequent encounter for fracture with routine healing: Secondary | ICD-10-CM | POA: Diagnosis not present

## 2021-08-15 DIAGNOSIS — M79671 Pain in right foot: Secondary | ICD-10-CM

## 2021-08-15 NOTE — Progress Notes (Signed)
Subjective: [name] is a 70 y.o. female patient who presents to office for follow-up evaluation of Right foot/heel pain secondary to calcaneal fracture that occurred on 07/29/2021. Patient reports that she is feeling better using Tylenol and is scheduled to go to work tomorrow. Patient denies any other pedal complaints.   Patient Active Problem List   Diagnosis Date Noted   CVA (cerebral vascular accident) ([place]) 12/19/2020   Well adult exam 07/03/2020   Left tibial fracture 08/03/2019   Capsulitis of metatarsophalangeal (MTP) joint of left foot 01/22/2018   Memory changes 07/04/2016   Asthma 08/14/2015   Laryngopharyngeal reflux (LPR) 08/14/2015   Rhinitis 08/14/2015   Acute respiratory failure with hypoxemia ([place]) 04/20/2014   Encephalopathy acute 04/20/2014   Primary localized osteoarthrosis, lower leg 04/18/2014   Essential hypertension 04/15/2014   Type 2 diabetes mellitus without complication ([place]) 54/27/0623    Current Outpatient Medications on File Prior to Visit  Medication Sig Dispense Refill   acyclovir (ZOVIRAX) 800 MG tablet TAKE 1 TABLET (800 MG) BY MOUTH 4 TIMES PER DAY FOR 3-7 DAYS AS NEEDED 30 tablet 2   amLODipine (NORVASC) 10 MG tablet TAKE 1 TABLET BY MOUTH EVERY DAY 90 tablet 3   aspirin EC 81 MG tablet Take 81 mg by mouth 2 (two) times a week.      buPROPion (WELLBUTRIN XL) 150 MG 24 hr tablet TAKE 1 TABLET BY MOUTH EVERY DAY 90 tablet 3   clopidogrel (PLAVIX) 75 MG tablet Take 1 tablet (75 mg total) by mouth daily. 14 tablet 0   gabapentin (NEURONTIN) 100 MG capsule Take 2 capsules (200 mg total) by mouth at bedtime. 180 capsule 1   GABAPENTIN PO Take 2 capsules by mouth daily.     HYDROcodone-acetaminophen (NORCO/VICODIN) 5-325 MG tablet Take 1 tablet by mouth every 6 (six) hours as needed.     insulin aspart (NOVOLOG) 100 UNIT/ML injection Inject 2-3 Units into the skin 3 (three) times daily before meals. Inject 6 units every am, 6 units at noon and 3  units at bedtime.      insulin glargine (LANTUS) 100 UNIT/ML injection Inject 0.15 mLs (15 Units total) into the skin every evening. Reported on 02/02/2016 10 mL 1   levothyroxine (SYNTHROID) 125 MCG tablet Take 1 tablet by mouth daily.     levothyroxine (SYNTHROID) 137 MCG tablet TAKE 1 TABLET BY MOUTH EVERY DAY BEFORE BREAKFAST 30 tablet 1   lisinopril-hydrochlorothiazide (ZESTORETIC) 10-12.5 MG tablet TAKE 1 TABLET BY MOUTH EVERY DAY 90 tablet 1   LORazepam (ATIVAN) 0.5 MG tablet TAKE 1 TABLET BY MOUTH EVERY DAY AS NEEDED FOR ANXIETY 30 tablet 3   meloxicam (MOBIC) 15 MG tablet Take 15 mg by mouth daily.     metoCLOPramide (REGLAN) 10 MG tablet Take 1 tablet (10 mg total) by mouth daily. 30 tablet 0   Omega-3 Fatty Acids (FISH OIL PO) Take 1 capsule by mouth daily.     omeprazole (PRILOSEC) 40 MG capsule TAKE 1 CAPSULE BY MOUTH EVERY DAY 90 capsule 3   ondansetron (ZOFRAN) 4 MG tablet Take 1 tablet (4 mg total) by mouth every 8 (eight) hours as needed for nausea or vomiting. 20 tablet 0   ONETOUCH ULTRA test strip SMARTSIG:6 Via Meter 6 Times Daily (Patient not taking: No sig reported)     oxyCODONE (OXY IR/ROXICODONE) 5 MG immediate release tablet SMARTSIG:5 Milligram(s) By Mouth Every 6 Hours PRN     Rimegepant Sulfate (NURTEC) 75 MG TBDP Take 75 mg  by mouth once as needed for up to 1 dose. 30 tablet 0   rosuvastatin (CRESTOR) 40 MG tablet Take 40 mg by mouth daily.     venlafaxine XR (EFFEXOR-XR) 150 MG 24 hr capsule TAKE 1 CAPSULE BY MOUTH EVERY DAY 90 capsule 2   Vitamin D, Ergocalciferol, (DRISDOL) 1.25 MG (50000 UNIT) CAPS capsule TAKE 1 CAPSULE BY MOUTH EACH WEEK 12 capsule 3   No current facility-administered medications on file prior to visit.    No Known Allergies  Objective:  General: Alert and oriented x3 in no acute distress  Dermatology: No open lesions bilateral lower extremities, no webspace macerations, + ecchymosis improving in nature, all nails x 10 are well  manicured.  Vascular: Focal edema noted to right foot. Dorsalis Pedis and Posterior Tibial pedal pulses 1/4, Capillary Fill Time 3 seconds,(+) pedal hair growth bilateral, Temperature gradient within normal limits.  Neurology: Johney Maine sensation intact via light touch bilateral.  Musculoskeletal: There is tenderness with palpation at Right heel, no calf compression bilateral, Strength within normal limits in all groups bilateral except on the right.   Assessment and Plan: Problem List Items Addressed This Visit   None Visit Diagnoses     Closed nondisplaced fracture of right calcaneus with routine healing, unspecified portion of calcaneus, subsequent encounter    -  Primary   Relevant Orders   DG Foot Complete Right   Pain of right heel           -Complete examination performed -Xrays reviewed consistent with nondisplaced calcaneal fracture extra-articular -Re-Discussed treatment options for fracture; risks, alternatives, and benefits explained. 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  Landis Martins, DPM

## 2021-08-21 ENCOUNTER — Other Ambulatory Visit: Payer: Self-pay | Admitting: Family Medicine

## 2021-08-24 ENCOUNTER — Telehealth: Payer: Self-pay

## 2021-08-24 NOTE — Chronic Care Management (AMB) (Addendum)
Chronic Care Management Pharmacy Assistant   Name: [name]  MRN: [id number] DOB: 1951-01-02   Reason for Encounter: Chart Prep for initial visit with [name] on 09/03/21   Conditions to be addressed/monitored: HTN and DMII, CVA, Asthma, Osteoarthrosis  Recent office visits:  06/05/21 Stephanie Brome MD. Seen for routine visit. No med changes. No follow ups on file.  Recent consult visits:  08/15/21 (Podiatry) [name], [name] DPM. Seen for Fracture of calcaneus. Xrays ordered. No med changes.  Follow up in 2 weeks.  07/31/21 (Podiatry) [name] DPM. Seen for Fracture of calcaneus. No med changes. Follow up in 2 weeks.  06/18/21 (Endocrinology) Stephanie Osgood MD. Seen for DM. Notes stated  She is now taking Lantus pen 15 units nightly (around 9 PM) and NovoLog Flexpen 5 units before breakfast, 5 units before lunch and 2 units before supper AC + correction of 1 unit per 50 mg/dl over a target of 150 + at bedtime will take SSI in same manner, as needed. Follow up in 3 months.  03/26/21 (Endocrinology) Stephanie Osgood MD. Seen for Eye Exam. No med changes. Follow up for next appt.  03/14/21 (Endocrinology) Stephanie Osgood MD. Seen for poor control of DM. Adjust the NOVOLOG insulin to: 6 units before breakfast 6 units before lunch 2 units before supper Adjust dose of rosuvastatin from 20 to 40 mg daily.  Follow up in 3 months.  Hospital visits:  None in previous 6 months  Medications: Outpatient Encounter Medications as of 08/24/2021  Medication Sig Note   acyclovir (ZOVIRAX) 800 MG tablet TAKE 1 TABLET (800 MG) BY MOUTH 4 TIMES PER DAY FOR 3-7 DAYS AS NEEDED    amLODipine (NORVASC) 10 MG tablet TAKE 1 TABLET BY MOUTH EVERY DAY    aspirin EC 81 MG tablet Take 81 mg by mouth 2 (two) times a week.  01/22/2021: Off hold   buPROPion (WELLBUTRIN XL) 150 MG 24 hr tablet TAKE 1 TABLET BY MOUTH EVERY DAY    clopidogrel (PLAVIX) 75 MG tablet Take 1 tablet (75 mg  total) by mouth daily.    gabapentin (NEURONTIN) 100 MG capsule Take 2 capsules (200 mg total) by mouth at bedtime.    GABAPENTIN PO Take 2 capsules by mouth daily.    HYDROcodone-acetaminophen (NORCO/VICODIN) 5-325 MG tablet Take 1 tablet by mouth every 6 (six) hours as needed.    insulin aspart (NOVOLOG) 100 UNIT/ML injection Inject 2-3 Units into the skin 3 (three) times daily before meals. Inject 6 units every am, 6 units at noon and 3 units at bedtime.     insulin glargine (LANTUS) 100 UNIT/ML injection Inject 0.15 mLs (15 Units total) into the skin every evening. Reported on 02/02/2016    levothyroxine (SYNTHROID) 137 MCG tablet TAKE 1 TABLET BY MOUTH EVERY DAY BEFORE BREAKFAST    lisinopril-hydrochlorothiazide (ZESTORETIC) 10-12.5 MG tablet TAKE 1 TABLET BY MOUTH EVERY DAY    LORazepam (ATIVAN) 0.5 MG tablet TAKE 1 TABLET BY MOUTH EVERY DAY AS NEEDED FOR ANXIETY    meloxicam (MOBIC) 15 MG tablet Take 15 mg by mouth daily.    metoCLOPramide (REGLAN) 10 MG tablet Take 1 tablet (10 mg total) by mouth daily.    Omega-3 Fatty Acids (FISH OIL PO) Take 1 capsule by mouth daily.    omeprazole (PRILOSEC) 40 MG capsule TAKE 1 CAPSULE BY MOUTH EVERY DAY    ondansetron (ZOFRAN) 4 MG tablet Take 1 tablet (4 mg total) by mouth every 8 (eight)  hours as needed for nausea or vomiting.    ONETOUCH ULTRA test strip SMARTSIG:6 Via Meter 6 Times Daily (Patient not taking: No sig reported)    oxyCODONE (OXY IR/ROXICODONE) 5 MG immediate release tablet SMARTSIG:5 Milligram(s) By Mouth Every 6 Hours PRN    Rimegepant Sulfate (NURTEC) 75 MG TBDP Take 75 mg by mouth once as needed for up to 1 dose.    rosuvastatin (CRESTOR) 40 MG tablet Take 40 mg by mouth daily.    venlafaxine XR (EFFEXOR-XR) 150 MG 24 hr capsule TAKE 1 CAPSULE BY MOUTH EVERY DAY    Vitamin D, Ergocalciferol, (DRISDOL) 1.25 MG (50000 UNIT) CAPS capsule TAKE 1 CAPSULE BY MOUTH EACH WEEK    No facility-administered encounter medications on file as  of 08/24/2021.    Lab Results  Component Value Date/Time   HGBA1C 8.5 (H) 04/17/2021 08:06 AM   HGBA1C 8.9 (H) 09/01/2020 12:20 PM     BP Readings from Last 3 Encounters:  06/05/21 120/60  01/22/21 (!) 135/56  01/16/21 130/60    Patient Questions:  Have you seen any other providers since your last visit with PCP? Pt states no other providers  Any changes in your medications or health? No changes   Any side effects from any medications? No side effects  Do you have an symptoms or problems not managed by your medications? Pt stated no other issues other than a broken ankle   Any concerns about your health right now? No concerns  Has your provider asked that you check blood pressure, blood sugar, or follow special diet at home? Pt stated no   Do you get any type of exercise on a regular basis?  Pt usually exercises about 45 min's at the gym but since she has broken her ankle she will do floor yoga.  Can you think of a goal you would like to reach for your health? She wants to have a better A1C. Her last A1C was on 04/17/21 (8.5)  Do you have any problems getting your medications? Pt stated no issues   Is there anything that you would like to discuss during the appointment? Nothing else to add.   [name] was reminded to have all medications, supplements and any blood glucose and blood pressure readings available for review with [name] Pharm. D, at her telephone visit on 09/03/21 at 12:00 pm .    Star Rating Drugs:  Medication:  Last Fill: Day Supply Lisinopril-HCTZ 07/22/21  90ds Rosuvastatin   06/15/21 90ds   Care Gaps: Last annual wellness visit? None noted  If applicable: Last eye exam / retinopathy screening? Never done  Last diabetic foot exam? Done on 07/31/21   Elray Mcgregor, [place] Pharmacist Assistant  [phone]

## 2021-08-24 NOTE — Chronic Care Management (AMB) (Signed)
I called pt and it looks like she canceled her initial appt with [facility]. Pt did not want to reschedule at this time due to being at work. She stated she cannot come in until the kids school is out. I informed it that it will just be a telephone call that she did not have to come in. Pt wanted to call back after she looks at her schedule.   [name], [place] Pharmacist Assistant  [phone]

## 2021-08-27 DIAGNOSIS — G8929 Other chronic pain: Secondary | ICD-10-CM | POA: Diagnosis not present

## 2021-08-27 DIAGNOSIS — M19011 Primary osteoarthritis, right shoulder: Secondary | ICD-10-CM | POA: Diagnosis not present

## 2021-08-27 DIAGNOSIS — M25511 Pain in right shoulder: Secondary | ICD-10-CM | POA: Diagnosis not present

## 2021-08-31 ENCOUNTER — Ambulatory Visit: Payer: PPO | Admitting: Sports Medicine

## 2021-08-31 ENCOUNTER — Telehealth: Payer: PPO

## 2021-09-03 ENCOUNTER — Telehealth: Payer: PPO

## 2021-09-04 ENCOUNTER — Ambulatory Visit: Payer: PPO | Admitting: Sports Medicine

## 2021-09-04 ENCOUNTER — Encounter: Payer: Self-pay | Admitting: Sports Medicine

## 2021-09-04 ENCOUNTER — Ambulatory Visit (INDEPENDENT_AMBULATORY_CARE_PROVIDER_SITE_OTHER): Payer: PPO

## 2021-09-04 ENCOUNTER — Other Ambulatory Visit: Payer: Self-pay

## 2021-09-04 DIAGNOSIS — M79671 Pain in right foot: Secondary | ICD-10-CM | POA: Diagnosis not present

## 2021-09-04 DIAGNOSIS — S92001A Unspecified fracture of right calcaneus, initial encounter for closed fracture: Secondary | ICD-10-CM

## 2021-09-04 DIAGNOSIS — S92001D Unspecified fracture of right calcaneus, subsequent encounter for fracture with routine healing: Secondary | ICD-10-CM | POA: Diagnosis not present

## 2021-09-04 NOTE — Progress Notes (Signed)
Subjective: [name] is a 70 y.o. female patient who returns to office for follow-up evaluation of Right foot/heel pain secondary to calcaneal fracture that occurred on 07/29/2021. 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  No other pedal complaints noted.  Patient Active Problem List   Diagnosis Date Noted   CVA (cerebral vascular accident) ([place]) 12/19/2020   Well adult exam 07/03/2020   Left tibial fracture 08/03/2019   Capsulitis of metatarsophalangeal (MTP) joint of left foot 01/22/2018   Memory changes 07/04/2016   Asthma 08/14/2015   Laryngopharyngeal reflux (LPR) 08/14/2015   Rhinitis 08/14/2015   Acute respiratory failure with hypoxemia ([place]) 04/20/2014   Encephalopathy acute 04/20/2014   Primary localized osteoarthrosis, lower leg 04/18/2014   Essential hypertension 04/15/2014   Type 2 diabetes mellitus without complication ([place]) 03/54/6568    Current Outpatient Medications on File Prior to Visit  Medication Sig Dispense Refill   acyclovir (ZOVIRAX) 800 MG tablet TAKE 1 TABLET (800 MG) BY MOUTH 4 TIMES PER DAY FOR 3-7 DAYS AS NEEDED 30 tablet 2   amLODipine (NORVASC) 10 MG tablet TAKE 1 TABLET BY MOUTH EVERY DAY 90 tablet 3   aspirin EC 81 MG tablet Take 81 mg by mouth 2 (two) times a week.      buPROPion (WELLBUTRIN XL) 150 MG 24 hr tablet TAKE 1 TABLET BY MOUTH EVERY DAY 90 tablet 3   clopidogrel (PLAVIX) 75 MG tablet Take 1 tablet (75 mg total) by mouth daily. 14 tablet 0   gabapentin (NEURONTIN) 100 MG capsule Take 2 capsules (200 mg total) by mouth at bedtime. 180 capsule 1   GABAPENTIN PO Take 2 capsules by mouth daily.     HYDROcodone-acetaminophen (NORCO/VICODIN) 5-325 MG tablet Take 1 tablet by mouth every 6 (six) hours as needed.     insulin aspart (NOVOLOG) 100  UNIT/ML injection Inject 2-3 Units into the skin 3 (three) times daily before meals. Inject 6 units every am, 6 units at noon and 3 units at bedtime.      insulin glargine (LANTUS) 100 UNIT/ML injection Inject 0.15 mLs (15 Units total) into the skin every evening. Reported on 02/02/2016 10 mL 1   levothyroxine (SYNTHROID) 137 MCG tablet TAKE 1 TABLET BY MOUTH EVERY DAY BEFORE BREAKFAST 90 tablet 1   lisinopril-hydrochlorothiazide (ZESTORETIC) 10-12.5 MG tablet TAKE 1 TABLET BY MOUTH EVERY DAY 90 tablet 1   LORazepam (ATIVAN) 0.5 MG tablet TAKE 1 TABLET BY MOUTH EVERY DAY AS NEEDED FOR ANXIETY 30 tablet 3   meloxicam (MOBIC) 15 MG tablet Take 15 mg by mouth daily.     metoCLOPramide (REGLAN) 10 MG tablet Take 1 tablet (10 mg total) by mouth daily. 30 tablet 0   Omega-3 Fatty Acids (FISH OIL PO) Take 1 capsule by mouth daily.     omeprazole (PRILOSEC) 40 MG capsule TAKE 1 CAPSULE BY MOUTH EVERY DAY 90 capsule 3   ondansetron (ZOFRAN) 4 MG tablet Take 1 tablet (4 mg total) by mouth every 8 (eight) hours as needed for nausea or vomiting. 20 tablet 0   ONETOUCH ULTRA test strip SMARTSIG:6 Via Meter 6 Times Daily (Patient not taking: No sig reported)     oxyCODONE (OXY IR/ROXICODONE) 5 MG immediate release tablet SMARTSIG:5 Milligram(s) By Mouth Every 6 Hours PRN     Rimegepant Sulfate (  NURTEC) 75 MG TBDP Take 75 mg by mouth once as needed for up to 1 dose. 30 tablet 0   rosuvastatin (CRESTOR) 40 MG tablet Take 40 mg by mouth daily.     venlafaxine XR (EFFEXOR-XR) 150 MG 24 hr capsule TAKE 1 CAPSULE BY MOUTH EVERY DAY 90 capsule 2   Vitamin D, Ergocalciferol, (DRISDOL) 1.25 MG (50000 UNIT) CAPS capsule TAKE 1 CAPSULE BY MOUTH EACH WEEK 12 capsule 3   No current facility-administered medications on file prior to visit.    No Known Allergies  Objective:  General: Alert and oriented x3 in no acute distress  Dermatology: No open lesions bilateral lower extremities, no webspace macerations, resolved  ecchymosis improving in nature, all nails x 10 are well manicured.  Vascular: Decreased focal edema noted to right foot. Dorsalis Pedis and Posterior Tibial pedal pulses 1/4, Capillary Fill Time 3 seconds,(+) pedal hair growth bilateral, Temperature gradient within normal limits.  Neurology: Johney Maine sensation intact via light touch bilateral.  Musculoskeletal: There is milf tenderness with palpation at Right heel, no calf compression bilateral, Strength within normal limits in all groups bilateral except on the right however patient has been doing chair yoga and seems to have maintained a lot of upper thigh strength.  Assessment and Plan: Problem List Items Addressed This Visit   None Visit Diagnoses     Closed nondisplaced fracture of right calcaneus with routine healing, unspecified portion of calcaneus, subsequent encounter    -  Primary   Relevant Orders   DG Foot Complete Right   Pain of right heel       Relevant Orders   DG Foot Complete Right       -Complete examination performed -Xrays reviewed consistent with nondisplaced calcaneal fracture extra-articular that is healing but still fractured -Re-Discussed treatment options for fracture; risks, alternatives, and benefits explained. 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 Advised patient next visit may transition her to a short CAM boot and continue with NWB. -Rx sent to [facility] for patient to get an aide to help her with  ADLs including bathing and washing hair.  Landis Martins, DPM

## 2021-09-10 ENCOUNTER — Telehealth: Payer: Self-pay

## 2021-09-10 DIAGNOSIS — E10319 Type 1 diabetes mellitus with unspecified diabetic retinopathy without macular edema: Secondary | ICD-10-CM | POA: Diagnosis not present

## 2021-09-10 DIAGNOSIS — E1065 Type 1 diabetes mellitus with hyperglycemia: Secondary | ICD-10-CM | POA: Diagnosis not present

## 2021-09-10 NOTE — Telephone Encounter (Signed)
Tillie Rung from [facility] called stating they do not do home care for ADLs,  it's a service they do not provide. Tillie Rung states [facility] do and we can contact them to set it up for the pt calling at [phone]

## 2021-09-11 ENCOUNTER — Ambulatory Visit: Payer: PPO

## 2021-09-11 NOTE — Telephone Encounter (Signed)
Contaced [facility] to see what services they provide. Rep states for [facility] they only provide [facility] service no home health

## 2021-09-11 NOTE — Patient Instructions (Signed)
Visit Information   Goals Addressed   None    There are no care plans to display for this patient.   Ms. Texidor was given information about Chronic Care Management services today including:  CCM service includes personalized support from designated clinical staff supervised by her physician, including individualized plan of care and coordination with other care providers 24/7 contact phone numbers for assistance for urgent and routine care needs. Standard insurance, coinsurance, copays and deductibles apply for chronic care management only during months in which we provide at least 20 minutes of these services. Most insurances cover these services at 100%, however patients may be responsible for any copay, coinsurance and/or deductible if applicable. This service may help you avoid the need for more expensive face-to-face services. Only one practitioner may furnish and bill the service in a calendar month. The patient may stop CCM services at any time (effective at the end of the month) by phone call to the office staff.  Patient agreed to services and verbal consent obtained.   The patient verbalized understanding of instructions, educational materials, and care plan provided today and declined offer to receive copy of patient instructions, educational materials, and care plan.  CCM enrollment status changed to "previously enrolled" as per patient request on 09/11/21 to discontinue enrollment. Case closed to case management services in primary care home.   Lane Hacker, [facility]

## 2021-09-11 NOTE — Telephone Encounter (Signed)
Contacted pt and relayed Dr. Leeanne Rio message. Pt states she will contact her insurance and see what company they recommend

## 2021-09-11 NOTE — Progress Notes (Signed)
Spoke with patient last week, she was at school and asked me to call today. I introduced myself quickly and she told me she was busy and couldn't talk. Would like me to call back in 5 minutes. I called after 10 minutes and we spoke briefly before she hung up the phone. Will remove from CCM after this visit. Was able to determine that she is no longer on Oxy or Hydrocodone (From ER visit) and per Podiatry visit is able to use just APAP for pain control. Will coordinate with PCP to remove from [place]

## 2021-09-19 ENCOUNTER — Ambulatory Visit (INDEPENDENT_AMBULATORY_CARE_PROVIDER_SITE_OTHER): Payer: PPO

## 2021-09-19 ENCOUNTER — Ambulatory Visit (INDEPENDENT_AMBULATORY_CARE_PROVIDER_SITE_OTHER): Payer: PPO | Admitting: Sports Medicine

## 2021-09-19 ENCOUNTER — Other Ambulatory Visit: Payer: Self-pay

## 2021-09-19 ENCOUNTER — Encounter: Payer: Self-pay | Admitting: Sports Medicine

## 2021-09-19 DIAGNOSIS — S92001A Unspecified fracture of right calcaneus, initial encounter for closed fracture: Secondary | ICD-10-CM

## 2021-09-19 DIAGNOSIS — M79671 Pain in right foot: Secondary | ICD-10-CM

## 2021-09-19 DIAGNOSIS — S92001D Unspecified fracture of right calcaneus, subsequent encounter for fracture with routine healing: Secondary | ICD-10-CM

## 2021-09-19 NOTE — Progress Notes (Signed)
Subjective: [name] is a 70 y.o. female patient who returns to office for follow-up evaluation of Right foot/heel pain secondary to calcaneal fracture that occurred on 07/29/2021. Patient reports that she is doing good no pain, feels fine.  No other pedal complaints noted.  Reports that she plans to travel for thanksgiving.  Patient Active Problem List   Diagnosis Date Noted   CVA (cerebral vascular accident) ([place]) 12/19/2020   Well adult exam 07/03/2020   Left tibial fracture 08/03/2019   Capsulitis of metatarsophalangeal (MTP) joint of left foot 01/22/2018   Memory changes 07/04/2016   Asthma 08/14/2015   Laryngopharyngeal reflux (LPR) 08/14/2015   Rhinitis 08/14/2015   Acute respiratory failure with hypoxemia ([place]) 04/20/2014   Encephalopathy acute 04/20/2014   Primary localized osteoarthrosis, lower leg 04/18/2014   Essential hypertension 04/15/2014   Type 2 diabetes mellitus without complication ([place]) 38/18/2993    Current Outpatient Medications on File Prior to Visit  Medication Sig Dispense Refill   acyclovir (ZOVIRAX) 800 MG tablet TAKE 1 TABLET (800 MG) BY MOUTH 4 TIMES PER DAY FOR 3-7 DAYS AS NEEDED 30 tablet 2   amLODipine (NORVASC) 10 MG tablet TAKE 1 TABLET BY MOUTH EVERY DAY 90 tablet 3   aspirin EC 81 MG tablet Take 81 mg by mouth 2 (two) times a week.      buPROPion (WELLBUTRIN XL) 150 MG 24 hr tablet TAKE 1 TABLET BY MOUTH EVERY DAY 90 tablet 3   clopidogrel (PLAVIX) 75 MG tablet Take 1 tablet (75 mg total) by mouth daily. 14 tablet 0   gabapentin (NEURONTIN) 100 MG capsule Take 2 capsules (200 mg total) by mouth at bedtime. 180 capsule 1   GABAPENTIN PO Take 2 capsules by mouth daily. (Patient not taking: Reported on 09/11/2021)     HYDROcodone-acetaminophen (NORCO/VICODIN) 5-325 MG tablet Take 1 tablet by mouth every 6 (six) hours as needed. (Patient not taking: Reported on 09/11/2021)     insulin aspart (NOVOLOG) 100 UNIT/ML injection Inject 2-3 Units into  the skin 3 (three) times daily before meals. Inject 6 units every am, 6 units at noon and 3 units at bedtime.      insulin glargine (LANTUS) 100 UNIT/ML injection Inject 0.15 mLs (15 Units total) into the skin every evening. Reported on 02/02/2016 10 mL 1   levothyroxine (SYNTHROID) 137 MCG tablet TAKE 1 TABLET BY MOUTH EVERY DAY BEFORE BREAKFAST 90 tablet 1   lisinopril-hydrochlorothiazide (ZESTORETIC) 10-12.5 MG tablet TAKE 1 TABLET BY MOUTH EVERY DAY 90 tablet 1   LORazepam (ATIVAN) 0.5 MG tablet TAKE 1 TABLET BY MOUTH EVERY DAY AS NEEDED FOR ANXIETY 30 tablet 3   meloxicam (MOBIC) 15 MG tablet Take 15 mg by mouth daily.     metoCLOPramide (REGLAN) 10 MG tablet Take 1 tablet (10 mg total) by mouth daily. 30 tablet 0   Omega-3 Fatty Acids (FISH OIL PO) Take 1 capsule by mouth daily.     omeprazole (PRILOSEC) 40 MG capsule TAKE 1 CAPSULE BY MOUTH EVERY DAY 90 capsule 3   ondansetron (ZOFRAN) 4 MG tablet Take 1 tablet (4 mg total) by mouth every 8 (eight) hours as needed for nausea or vomiting. 20 tablet 0   ONETOUCH ULTRA test strip SMARTSIG:6 Via Meter 6 Times Daily (Patient not taking: No sig reported)     oxyCODONE (OXY IR/ROXICODONE) 5 MG immediate release tablet SMARTSIG:5 Milligram(s) By Mouth Every 6 Hours PRN (Patient not taking: Reported on 09/11/2021)     Rimegepant Sulfate (NURTEC) 75  MG TBDP Take 75 mg by mouth once as needed for up to 1 dose. 30 tablet 0   rosuvastatin (CRESTOR) 40 MG tablet Take 40 mg by mouth daily.     venlafaxine XR (EFFEXOR-XR) 150 MG 24 hr capsule TAKE 1 CAPSULE BY MOUTH EVERY DAY 90 capsule 2   Vitamin D, Ergocalciferol, (DRISDOL) 1.25 MG (50000 UNIT) CAPS capsule TAKE 1 CAPSULE BY MOUTH EACH WEEK 12 capsule 3   No current facility-administered medications on file prior to visit.    No Known Allergies  Objective:  General: Alert and oriented x3 in no acute distress  Dermatology: No open lesions bilateral lower extremities, no webspace macerations,  resolved ecchymosis improving in nature, all nails x 10 are well manicured.  Vascular: Decreased focal edema noted to right foot. Dorsalis Pedis and Posterior Tibial pedal pulses 1/4, Capillary Fill Time 3 seconds,(+) pedal hair growth bilateral, Temperature gradient within normal limits.  Neurology: Johney Maine sensation intact via light touch bilateral.  Musculoskeletal: There is no  tenderness with palpation at Right heel, no calf compression bilateral, Strength within normal limits in all groups.  Assessment and Plan: Problem List Items Addressed This Visit   None Visit Diagnoses     Closed nondisplaced fracture of right calcaneus with routine healing, unspecified portion of calcaneus, subsequent encounter    -  Primary   Relevant Orders   DG Foot Complete Right   Pain of right heel       Relevant Orders   DG Foot Complete Right       -Complete examination performed -Xrays reviewed consistent with nondisplaced calcaneal fracture extra-articular that is healing but still fractured not completely consolidated -Re-Discussed treatment options for fracture; risks, alternatives, and benefits explained. -Dispensed CAM boot -Recommend protection, rest, ice, elevation daily until symptoms improve -Continue with Tylenol as needed for pain -Patient to continue with NWB with Cam boot for 3 more weeks  with knee scooter and thenafter may toe touch weightbear with walker since she can not use crutches -Return in 3-4 weeks for follow up xrays for right heel fracture.   Landis Martins, DPM

## 2021-09-21 ENCOUNTER — Telehealth: Payer: Self-pay

## 2021-09-21 NOTE — Telephone Encounter (Signed)
Attempted to call and talk with patient, to advise to use her scooter more, make sure her heel is not all the way down in the boot, and back off on the WB for now. There was no answer and I was unable to leave a voicemail

## 2021-09-21 NOTE — Telephone Encounter (Signed)
Patient called and left a voicemail on the nurse line. Since she was transitioned from a cast to a boot, she is having more pain. She's not sure if this is because of the boot or because she is slightly putting weight on that foot/toe now. Please call to discuss

## 2021-09-24 ENCOUNTER — Telehealth: Payer: Self-pay | Admitting: Sports Medicine

## 2021-09-24 ENCOUNTER — Ambulatory Visit: Payer: PPO | Admitting: Neurology

## 2021-09-24 NOTE — Telephone Encounter (Signed)
Pt left msg stating she fell last night and stepped hard on right heel.  States she is wearing boot but pain is unbearable and she is worried she re-injured foot.

## 2021-09-24 NOTE — Telephone Encounter (Signed)
Pt had left a voice mail to call her back. I tried to call pt back to inform her of Dr. Leeanne Rio instructions/suggestions from 11/4 but I was unable to leave a message

## 2021-09-25 ENCOUNTER — Ambulatory Visit (INDEPENDENT_AMBULATORY_CARE_PROVIDER_SITE_OTHER): Payer: PPO

## 2021-09-25 ENCOUNTER — Other Ambulatory Visit: Payer: Self-pay

## 2021-09-25 ENCOUNTER — Ambulatory Visit (INDEPENDENT_AMBULATORY_CARE_PROVIDER_SITE_OTHER): Payer: PPO | Admitting: Sports Medicine

## 2021-09-25 ENCOUNTER — Encounter: Payer: Self-pay | Admitting: Sports Medicine

## 2021-09-25 DIAGNOSIS — S92001D Unspecified fracture of right calcaneus, subsequent encounter for fracture with routine healing: Secondary | ICD-10-CM

## 2021-09-25 DIAGNOSIS — M79671 Pain in right foot: Secondary | ICD-10-CM | POA: Diagnosis not present

## 2021-09-25 NOTE — Telephone Encounter (Signed)
Appt sched/reb

## 2021-09-25 NOTE — Progress Notes (Signed)
Subjective: [name] is a 70 y.o. female patient who returns to office for follow-up evaluation of Right foot/heel pain secondary to calcaneal fracture that occurred on 07/29/2021. Patient reports that she slide off scooter and fell on the right heel on Thursday and has been having sharp pains 10/10 to heel. No other pedal complaints noted.   Patient Active Problem List   Diagnosis Date Noted   CVA (cerebral vascular accident) ([place]) 12/19/2020   Well adult exam 07/03/2020   Left tibial fracture 08/03/2019   Capsulitis of metatarsophalangeal (MTP) joint of left foot 01/22/2018   Memory changes 07/04/2016   Asthma 08/14/2015   Laryngopharyngeal reflux (LPR) 08/14/2015   Rhinitis 08/14/2015   Acute respiratory failure with hypoxemia ([place]) 04/20/2014   Encephalopathy acute 04/20/2014   Primary localized osteoarthrosis, lower leg 04/18/2014   Essential hypertension 04/15/2014   Type 2 diabetes mellitus without complication ([place]) 97/12/6376    Current Outpatient Medications on File Prior to Visit  Medication Sig Dispense Refill   acyclovir (ZOVIRAX) 800 MG tablet TAKE 1 TABLET (800 MG) BY MOUTH 4 TIMES PER DAY FOR 3-7 DAYS AS NEEDED 30 tablet 2   amLODipine (NORVASC) 10 MG tablet TAKE 1 TABLET BY MOUTH EVERY DAY 90 tablet 3   aspirin EC 81 MG tablet Take 81 mg by mouth 2 (two) times a week.      buPROPion (WELLBUTRIN XL) 150 MG 24 hr tablet TAKE 1 TABLET BY MOUTH EVERY DAY 90 tablet 3   clopidogrel (PLAVIX) 75 MG tablet Take 1 tablet (75 mg total) by mouth daily. 14 tablet 0   gabapentin (NEURONTIN) 100 MG capsule Take 2 capsules (200 mg total) by mouth at bedtime. 180 capsule 1   GABAPENTIN PO Take 2 capsules by mouth daily. (Patient not taking: Reported on 09/11/2021)     HYDROcodone-acetaminophen (NORCO/VICODIN) 5-325 MG tablet Take 1 tablet by mouth every 6 (six) hours as needed. (Patient not taking: Reported on 09/11/2021)     insulin aspart (NOVOLOG) 100 UNIT/ML injection Inject  2-3 Units into the skin 3 (three) times daily before meals. Inject 6 units every am, 6 units at noon and 3 units at bedtime.      insulin glargine (LANTUS) 100 UNIT/ML injection Inject 0.15 mLs (15 Units total) into the skin every evening. Reported on 02/02/2016 10 mL 1   levothyroxine (SYNTHROID) 137 MCG tablet TAKE 1 TABLET BY MOUTH EVERY DAY BEFORE BREAKFAST 90 tablet 1   lisinopril-hydrochlorothiazide (ZESTORETIC) 10-12.5 MG tablet TAKE 1 TABLET BY MOUTH EVERY DAY 90 tablet 1   LORazepam (ATIVAN) 0.5 MG tablet TAKE 1 TABLET BY MOUTH EVERY DAY AS NEEDED FOR ANXIETY 30 tablet 3   meloxicam (MOBIC) 15 MG tablet Take 15 mg by mouth daily.     metoCLOPramide (REGLAN) 10 MG tablet Take 1 tablet (10 mg total) by mouth daily. 30 tablet 0   Omega-3 Fatty Acids (FISH OIL PO) Take 1 capsule by mouth daily.     omeprazole (PRILOSEC) 40 MG capsule TAKE 1 CAPSULE BY MOUTH EVERY DAY 90 capsule 3   ondansetron (ZOFRAN) 4 MG tablet Take 1 tablet (4 mg total) by mouth every 8 (eight) hours as needed for nausea or vomiting. 20 tablet 0   ONETOUCH ULTRA test strip SMARTSIG:6 Via Meter 6 Times Daily (Patient not taking: No sig reported)     oxyCODONE (OXY IR/ROXICODONE) 5 MG immediate release tablet SMARTSIG:5 Milligram(s) By Mouth Every 6 Hours PRN (Patient not taking: Reported on 09/11/2021)  Rimegepant Sulfate (NURTEC) 75 MG TBDP Take 75 mg by mouth once as needed for up to 1 dose. 30 tablet 0   rosuvastatin (CRESTOR) 40 MG tablet Take 40 mg by mouth daily.     venlafaxine XR (EFFEXOR-XR) 150 MG 24 hr capsule TAKE 1 CAPSULE BY MOUTH EVERY DAY 90 capsule 2   Vitamin D, Ergocalciferol, (DRISDOL) 1.25 MG (50000 UNIT) CAPS capsule TAKE 1 CAPSULE BY MOUTH EACH WEEK 12 capsule 3   No current facility-administered medications on file prior to visit.    No Known Allergies  Objective:  General: Alert and oriented x3 in no acute distress  Dermatology: No open lesions bilateral lower extremities, no webspace  macerations, resolved ecchymosis improving in nature, all nails x 10 are well manicured.  Vascular: Decreased focal edema noted to right foot. Dorsalis Pedis and Posterior Tibial pedal pulses 1/4, Capillary Fill Time 3 seconds,(+) pedal hair growth bilateral, Temperature gradient within normal limits.  Neurology: Johney Maine sensation intact via light touch bilateral.  Musculoskeletal: There is  tenderness with palpation at Right heel, no calf compression bilateral, Strength within normal limits in all groups.  Assessment and Plan: Problem List Items Addressed This Visit   None Visit Diagnoses     Closed nondisplaced fracture of right calcaneus with routine healing, unspecified portion of calcaneus, subsequent encounter    -  Primary   Relevant Orders   DG Foot Complete Right   Pain of right heel       Relevant Orders   DG Foot Complete Right       -Complete examination performed -Xrays reviewed consistent with nondisplaced calcaneal fracture extra-articular that is healing but still fractured not completely consolidated like before unchanged from xrays 5 days ago -Re-Discussed treatment options for fracture; risks, alternatives, and benefits explained. -Applied unna boot to right lower extremity to keep intact for 5-7 days and then to use Surgigrip sleeve as directed  -Continue with CAM boot and advised patient to refrain from using the scooter without the boot -Recommend protection, rest, ice, elevation daily until symptoms improve -Continue with Tylenol as needed for pain -Already on Gabapentin as given by neurologist for migraines  -Patient to continue with NWB -Return as scheduled or follow up xrays for right heel fracture.   Landis Martins, DPM

## 2021-09-26 NOTE — Progress Notes (Signed)
Acute Office Visit  Subjective:    Patient ID: [name], female    DOB: August 03, 1951, 70 y.o.   MRN: [id number]  Chief Complaint  Patient presents with   Urinary incontinence   Cough   Snoring    HPI Patient is in today for dry, hacky cough x yrs. NO chest pain or sob Nocturia. No dysuria. Stress incontinence. Unable to give [place] a urine sample. CGM: 50-300. Sees endocrinology: last August.  Loud snoring. No apnea.  Pattern of breathing seems irregular.   Past Medical History:  Diagnosis Date   Depression    Diabetes mellitus without complication (HCC)    GERD (gastroesophageal reflux disease)    Hypertension    Hypothyroidism    Stroke ([place])    Type 1 diabetes ([place])     Past Surgical History:  Procedure Laterality Date   APPENDECTOMY     APPLICATION OF WOUND VAC Left 08/03/2019   Procedure: Application Of Wound Vac;  Surgeon: Altamese Saltaire, MD;  Location: [place];  Service: Orthopedics;  Laterality: Left;   CESAREAN SECTION     ESOPHAGOGASTRODUODENOSCOPY (EGD) WITH PROPOFOL N/A 02/02/2016   Procedure: ESOPHAGOGASTRODUODENOSCOPY (EGD) WITH PROPOFOL;  Surgeon: Josefine Class, MD;  [facility] ENDOSCOPY;  Service: Endoscopy;  Laterality: N/A;   HAND SURGERY     JOINT REPLACEMENT Bilateral    Knee    ORIF ANKLE FRACTURE Left 08/03/2019   with wound vac applied    ORIF ANKLE FRACTURE Left 08/03/2019   Procedure: OPEN REDUCTION INTERNAL FIXATION (ORIF) ANKLE FRACTURE;  Surgeon: Altamese Lafayette, MD;  Location: [place];  Service: Orthopedics;  Laterality: Left;   TIBIALIS TENDON TRANSFER / REPAIR      Family History  Problem Relation Age of Onset   Cancer Mother        Colon cancer    Dementia Father    Neuropathy Father     Social History   Socioeconomic History   Marital status: Single    Spouse name: Not on file   Number of children: Not on file   Years of education: Not on file   Highest education level: Not on file  Occupational History   Not on  file  Tobacco Use   Smoking status: Never   Smokeless tobacco: Never  Vaping Use   Vaping Use: Never used  Substance and Sexual Activity   Alcohol use: Yes    Alcohol/week: 1.0 standard drink    Types: 1 Glasses of wine per week    Comment: everyday   Drug use: Never   Sexual activity: Not Currently  Other Topics Concern   Not on file  Social History Narrative   Right hand   Lives alone   Social Determinants of Health   Financial Resource Strain: Not on file  Food Insecurity: Not on file  Transportation Needs: Not on file  Physical Activity: Not on file  Stress: Not on file  Social Connections: Not on file  Intimate Partner Violence: Not on file    Outpatient Medications Prior to Visit  Medication Sig Dispense Refill   aspirin EC 81 MG tablet Take 81 mg by mouth 2 (two) times a week.      acyclovir (ZOVIRAX) 800 MG tablet TAKE 1 TABLET (800 MG) BY MOUTH 4 TIMES PER DAY FOR 3-7 DAYS AS NEEDED 30 tablet 2   amLODipine (NORVASC) 10 MG tablet TAKE 1 TABLET BY MOUTH EVERY DAY 90 tablet 3   buPROPion (WELLBUTRIN XL) 150 MG 24  hr tablet TAKE 1 TABLET BY MOUTH EVERY DAY 90 tablet 3   clopidogrel (PLAVIX) 75 MG tablet Take 1 tablet (75 mg total) by mouth daily. 14 tablet 0   gabapentin (NEURONTIN) 100 MG capsule Take 2 capsules (200 mg total) by mouth at bedtime. 180 capsule 1   GABAPENTIN PO Take 2 capsules by mouth daily. (Patient not taking: Reported on 09/11/2021)     HYDROcodone-acetaminophen (NORCO/VICODIN) 5-325 MG tablet Take 1 tablet by mouth every 6 (six) hours as needed. (Patient not taking: No sig reported)     insulin aspart (NOVOLOG) 100 UNIT/ML injection Inject 2-3 Units into the skin 3 (three) times daily before meals. Inject 6 units every am, 6 units at noon and 3 units at bedtime.      insulin glargine (LANTUS) 100 UNIT/ML injection Inject 0.15 mLs (15 Units total) into the skin every evening. Reported on 02/02/2016 10 mL 1   levothyroxine (SYNTHROID) 137 MCG tablet  TAKE 1 TABLET BY MOUTH EVERY DAY BEFORE BREAKFAST 90 tablet 1   LORazepam (ATIVAN) 0.5 MG tablet TAKE 1 TABLET BY MOUTH EVERY DAY AS NEEDED FOR ANXIETY 30 tablet 3   metoCLOPramide (REGLAN) 10 MG tablet Take 1 tablet (10 mg total) by mouth daily. 30 tablet 0   Omega-3 Fatty Acids (FISH OIL PO) Take 1 capsule by mouth daily.     omeprazole (PRILOSEC) 40 MG capsule TAKE 1 CAPSULE BY MOUTH EVERY DAY 90 capsule 3   ondansetron (ZOFRAN) 4 MG tablet Take 1 tablet (4 mg total) by mouth every 8 (eight) hours as needed for nausea or vomiting. 20 tablet 0   ONETOUCH ULTRA test strip SMARTSIG:6 Via Meter 6 Times Daily (Patient not taking: No sig reported)     Rimegepant Sulfate (NURTEC) 75 MG TBDP Take 75 mg by mouth once as needed for up to 1 dose. 30 tablet 0   rosuvastatin (CRESTOR) 40 MG tablet Take 40 mg by mouth daily.     venlafaxine XR (EFFEXOR-XR) 150 MG 24 hr capsule TAKE 1 CAPSULE BY MOUTH EVERY DAY 90 capsule 2   Vitamin D, Ergocalciferol, (DRISDOL) 1.25 MG (50000 UNIT) CAPS capsule TAKE 1 CAPSULE BY MOUTH EACH WEEK 12 capsule 3   lisinopril-hydrochlorothiazide (ZESTORETIC) 10-12.5 MG tablet TAKE 1 TABLET BY MOUTH EVERY DAY 90 tablet 1   meloxicam (MOBIC) 15 MG tablet Take 15 mg by mouth daily.     oxyCODONE (OXY IR/ROXICODONE) 5 MG immediate release tablet SMARTSIG:5 Milligram(s) By Mouth Every 6 Hours PRN (Patient not taking: Reported on 09/11/2021)     No facility-administered medications prior to visit.    Allergies  Allergen Reactions   Lisinopril Cough    Review of Systems  Constitutional:  Negative for appetite change, fatigue and fever.  HENT:  Negative for congestion, ear pain, sinus pressure and sore throat.   Eyes:  Negative for pain.  Respiratory:  Negative for cough, chest tightness, shortness of breath and wheezing.   Cardiovascular:  Negative for chest pain and palpitations.  Gastrointestinal:  Negative for abdominal pain, constipation, diarrhea, nausea and vomiting.   Genitourinary:  Negative for dysuria and hematuria.  Musculoskeletal:  Negative for arthralgias, back pain, joint swelling and myalgias.  Skin:  Negative for rash.  Neurological:  Negative for dizziness, weakness and headaches.  Psychiatric/Behavioral:  Negative for dysphoric mood. The patient is not nervous/anxious.       Objective:    Physical Exam Vitals reviewed.  Constitutional:      Appearance: Normal appearance. She  is normal weight.  Neck:     Vascular: No carotid bruit.  Cardiovascular:     Rate and Rhythm: Normal rate and regular rhythm.     Heart sounds: Normal heart sounds.  Pulmonary:     Effort: Pulmonary effort is normal. No respiratory distress.     Breath sounds: Normal breath sounds.  Abdominal:     General: Abdomen is flat. Bowel sounds are normal.     Palpations: Abdomen is soft.     Tenderness: There is no abdominal tenderness.  Neurological:     Mental Status: She is alert and oriented to person, place, and time.  Psychiatric:        Mood and Affect: Mood normal.        Behavior: Behavior normal.    BP 110/64   Pulse 100   Temp (!) 96.3 F (35.7 C)   Resp 18   Ht _0  (1.626 m)   BMI 25.75 kg/m  Wt Readings from Last 3 Encounters:  06/05/21 150 lb (68 kg)  01/22/21 142 lb (64.4 kg)  01/16/21 144 lb (65.3 kg)    Health Maintenance Due  Topic Date Due   OPHTHALMOLOGY EXAM  Never done   Hepatitis C Screening  Never done   DEXA SCAN  Never done   COVID-19 Vaccine (5 - Booster) 04/13/2021   INFLUENZA VACCINE  06/18/2021   Pneumonia Vaccine 41+ Years old (2 - PPSV23 if available, else PCV20) 07/03/2021    There are no preventive care reminders to display for this patient.   Lab Results  Component Value Date   TSH 0.707 06/05/2021   Lab Results  Component Value Date   WBC 5.6 04/17/2021   HGB 12.6 04/17/2021   HCT 37.9 04/17/2021   MCV 92 04/17/2021   PLT 348 04/17/2021   Lab Results  Component Value Date   NA 136 04/17/2021    K 4.3 04/17/2021   CO2 23 04/17/2021   GLUCOSE 84 04/17/2021   BUN 20 04/17/2021   CREATININE 0.88 04/17/2021   BILITOT <0.2 04/17/2021   ALKPHOS 123 (H) 04/17/2021   AST 16 04/17/2021   ALT 16 04/17/2021   PROT 6.7 04/17/2021   ALBUMIN 4.4 04/17/2021   CALCIUM 9.2 04/17/2021   ANIONGAP 9 08/05/2019   EGFR 70 04/17/2021   Lab Results  Component Value Date   CHOL 245 (H) 04/17/2021   Lab Results  Component Value Date   HDL 119 04/17/2021   Lab Results  Component Value Date   LDLCALC 111 (H) 04/17/2021   Lab Results  Component Value Date   TRIG 88 04/17/2021   Lab Results  Component Value Date   CHOLHDL 2.1 04/17/2021   Lab Results  Component Value Date   HGBA1C 8.5 (H) 04/17/2021         Assessment & Plan:   Problem List Items Addressed This Visit       Endocrine   Type 1 diabetes mellitus with hyperglycemia (HCC)    Insulin adjusted by endocrinology.      Relevant Medications   losartan (COZAAR) 50 MG tablet     Other   Osteoporosis screening    Order dexa      Relevant Orders   DG Bone Density   Cough due to ACE inhibitor - Primary    Change ace to arb. Stop lisinopril/hctz to losaratan 50 mg once daily.      Relevant Medications   losartan (COZAAR) 50 MG tablet  Snoring    Order home sleep study      Relevant Orders   Home sleep test   Nocturia    Start detrol LA 2 mg once at night.      Relevant Medications   tolterodine (DETROL LA) 2 MG 24 hr capsule    I,Alvie Fowles M Larhonda Dettloff,acting as a scribe for Rochel Brome, MD.,have documented all relevant documentation on the behalf of Rochel Brome, MD,as directed by  Rochel Brome, MD while in the presence of Rochel Brome, MD.   Rochel Brome, MD

## 2021-09-27 ENCOUNTER — Ambulatory Visit (INDEPENDENT_AMBULATORY_CARE_PROVIDER_SITE_OTHER): Payer: PPO | Admitting: Family Medicine

## 2021-09-27 ENCOUNTER — Other Ambulatory Visit: Payer: Self-pay

## 2021-09-27 ENCOUNTER — Encounter: Payer: Self-pay | Admitting: Family Medicine

## 2021-09-27 VITALS — BP 110/64 | HR 100 | Temp 96.3°F | Resp 18 | Ht 64.0 in

## 2021-09-27 DIAGNOSIS — T464X5A Adverse effect of angiotensin-converting-enzyme inhibitors, initial encounter: Secondary | ICD-10-CM | POA: Diagnosis not present

## 2021-09-27 DIAGNOSIS — Z13820 Encounter for screening for osteoporosis: Secondary | ICD-10-CM

## 2021-09-27 DIAGNOSIS — R058 Other specified cough: Secondary | ICD-10-CM

## 2021-09-27 DIAGNOSIS — E1065 Type 1 diabetes mellitus with hyperglycemia: Secondary | ICD-10-CM | POA: Diagnosis not present

## 2021-09-27 DIAGNOSIS — R0683 Snoring: Secondary | ICD-10-CM

## 2021-09-27 DIAGNOSIS — R351 Nocturia: Secondary | ICD-10-CM | POA: Diagnosis not present

## 2021-09-27 MED ORDER — LOSARTAN POTASSIUM 50 MG PO TABS: 50.0000 mg | ORAL_TABLET | Freq: Every day | ORAL | 0 refills | Status: DC

## 2021-09-27 MED ORDER — TOLTERODINE TARTRATE ER 2 MG PO CP24: 2.0000 mg | ORAL_CAPSULE | Freq: Every day | ORAL | 2 refills | Status: DC

## 2021-09-27 NOTE — Patient Instructions (Addendum)
Hypertension: Stop Lisinopril/hct. Start Losartan 50 mg once daily.   Start detrol LA one at night for bladder issues.   Ordering home sleep study.   Ordering bone density

## 2021-09-30 DIAGNOSIS — R351 Nocturia: Secondary | ICD-10-CM | POA: Insufficient documentation

## 2021-09-30 DIAGNOSIS — R0683 Snoring: Secondary | ICD-10-CM | POA: Insufficient documentation

## 2021-09-30 DIAGNOSIS — E1065 Type 1 diabetes mellitus with hyperglycemia: Secondary | ICD-10-CM

## 2021-09-30 DIAGNOSIS — Z13820 Encounter for screening for osteoporosis: Secondary | ICD-10-CM | POA: Insufficient documentation

## 2021-09-30 DIAGNOSIS — T464X5A Adverse effect of angiotensin-converting-enzyme inhibitors, initial encounter: Secondary | ICD-10-CM

## 2021-09-30 DIAGNOSIS — R058 Other specified cough: Secondary | ICD-10-CM | POA: Insufficient documentation

## 2021-09-30 HISTORY — DX: Snoring: R06.83

## 2021-09-30 HISTORY — DX: Type 1 diabetes mellitus with hyperglycemia: E10.65

## 2021-09-30 HISTORY — DX: Other specified cough: R05.8

## 2021-09-30 HISTORY — DX: Adverse effect of angiotensin-converting-enzyme inhibitors, initial encounter: T46.4X5A

## 2021-09-30 HISTORY — DX: Nocturia: R35.1

## 2021-09-30 NOTE — Assessment & Plan Note (Signed)
Order dexa.  

## 2021-09-30 NOTE — Assessment & Plan Note (Signed)
Change ace to arb. Stop lisinopril/hctz to losaratan 50 mg once daily.

## 2021-09-30 NOTE — Assessment & Plan Note (Signed)
Order home sleep study  

## 2021-09-30 NOTE — Assessment & Plan Note (Signed)
Insulin adjusted by endocrinology.

## 2021-09-30 NOTE — Assessment & Plan Note (Signed)
Start detrol LA 2 mg once at night.

## 2021-10-02 ENCOUNTER — Telehealth: Payer: Self-pay | Admitting: Family Medicine

## 2021-10-02 ENCOUNTER — Other Ambulatory Visit: Payer: Self-pay | Admitting: Family Medicine

## 2021-10-02 NOTE — Telephone Encounter (Signed)
   [name] has been scheduled for the following appointment:  WHAT: BONE DENSITY WHERE: [facility] DATE: 12/28/2021 TIME: 9:30 AM ARRIVAL TIME   A message has been left for the patient.

## 2021-10-05 DIAGNOSIS — H903 Sensorineural hearing loss, bilateral: Secondary | ICD-10-CM | POA: Diagnosis not present

## 2021-10-09 ENCOUNTER — Telehealth: Payer: Self-pay | Admitting: Family Medicine

## 2021-10-09 NOTE — Telephone Encounter (Signed)
ERROR

## 2021-10-11 DIAGNOSIS — E1065 Type 1 diabetes mellitus with hyperglycemia: Secondary | ICD-10-CM | POA: Diagnosis not present

## 2021-10-11 DIAGNOSIS — E10319 Type 1 diabetes mellitus with unspecified diabetic retinopathy without macular edema: Secondary | ICD-10-CM | POA: Diagnosis not present

## 2021-10-15 DIAGNOSIS — S61411A Laceration without foreign body of right hand, initial encounter: Secondary | ICD-10-CM | POA: Diagnosis not present

## 2021-10-17 DIAGNOSIS — R0602 Shortness of breath: Secondary | ICD-10-CM | POA: Diagnosis not present

## 2021-10-17 DIAGNOSIS — G4733 Obstructive sleep apnea (adult) (pediatric): Secondary | ICD-10-CM | POA: Diagnosis not present

## 2021-10-18 DIAGNOSIS — G4733 Obstructive sleep apnea (adult) (pediatric): Secondary | ICD-10-CM | POA: Diagnosis not present

## 2021-10-18 DIAGNOSIS — R0602 Shortness of breath: Secondary | ICD-10-CM | POA: Diagnosis not present

## 2021-10-19 ENCOUNTER — Ambulatory Visit: Payer: PPO | Admitting: Sports Medicine

## 2021-10-19 ENCOUNTER — Ambulatory Visit (INDEPENDENT_AMBULATORY_CARE_PROVIDER_SITE_OTHER): Payer: PPO | Admitting: Sports Medicine

## 2021-10-19 ENCOUNTER — Ambulatory Visit (INDEPENDENT_AMBULATORY_CARE_PROVIDER_SITE_OTHER): Payer: PPO

## 2021-10-19 DIAGNOSIS — S92001D Unspecified fracture of right calcaneus, subsequent encounter for fracture with routine healing: Secondary | ICD-10-CM | POA: Diagnosis not present

## 2021-10-19 DIAGNOSIS — M79671 Pain in right foot: Secondary | ICD-10-CM | POA: Diagnosis not present

## 2021-10-19 NOTE — Progress Notes (Signed)
Subjective: [name] is a 70 y.o. female patient who returns to office for follow-up evaluation of Right foot/heel pain secondary to calcaneal fracture that occurred on 07/29/2021. Patient reports that she is able to walk better as no current pain states that she is does get some swelling off and on and has been weightbearing in the surgical boot without any major issues or problems.   Patient Active Problem List   Diagnosis Date Noted   Osteoporosis screening 09/30/2021   Cough due to ACE inhibitor 09/30/2021   Type 1 diabetes mellitus with hyperglycemia ([place]) 09/30/2021   Snoring 09/30/2021   Nocturia 09/30/2021   CVA (cerebral vascular accident) ([place]) 12/19/2020   Well adult exam 07/03/2020   Left tibial fracture 08/03/2019   Capsulitis of metatarsophalangeal (MTP) joint of left foot 01/22/2018   Memory changes 07/04/2016   Asthma 08/14/2015   Laryngopharyngeal reflux (LPR) 08/14/2015   Rhinitis 08/14/2015   Acute respiratory failure with hypoxemia ([place]) 04/20/2014   Primary localized osteoarthrosis, lower leg 04/18/2014   Essential hypertension 04/15/2014    Current Outpatient Medications on File Prior to Visit  Medication Sig Dispense Refill   acyclovir (ZOVIRAX) 800 MG tablet TAKE 1 TABLET (800 MG) BY MOUTH 4 TIMES PER DAY FOR 3-7 DAYS AS NEEDED 30 tablet 2   amLODipine (NORVASC) 10 MG tablet TAKE 1 TABLET BY MOUTH EVERY DAY 90 tablet 3   aspirin EC 81 MG tablet Take 81 mg by mouth 2 (two) times a week.      buPROPion (WELLBUTRIN XL) 150 MG 24 hr tablet TAKE 1 TABLET BY MOUTH EVERY DAY 90 tablet 3   clopidogrel (PLAVIX) 75 MG tablet Take 1 tablet (75 mg total) by mouth daily. 14 tablet 0   gabapentin (NEURONTIN) 100 MG capsule Take 2 capsules (200 mg total) by mouth at bedtime. 180 capsule 1   GABAPENTIN PO Take 2 capsules by mouth daily. (Patient not taking: Reported on 09/11/2021)     HYDROcodone-acetaminophen (NORCO/VICODIN) 5-325 MG tablet Take 1 tablet by mouth every  6 (six) hours as needed. (Patient not taking: No sig reported)     insulin aspart (NOVOLOG) 100 UNIT/ML injection Inject 2-3 Units into the skin 3 (three) times daily before meals. Inject 6 units every am, 6 units at noon and 3 units at bedtime.      insulin glargine (LANTUS) 100 UNIT/ML injection Inject 0.15 mLs (15 Units total) into the skin every evening. Reported on 02/02/2016 10 mL 1   levothyroxine (SYNTHROID) 137 MCG tablet TAKE 1 TABLET BY MOUTH EVERY DAY BEFORE BREAKFAST 90 tablet 1   LORazepam (ATIVAN) 0.5 MG tablet TAKE 1 TABLET BY MOUTH EVERY DAY AS NEEDED FOR ANXIETY 30 tablet 3   losartan (COZAAR) 50 MG tablet Take 1 tablet (50 mg total) by mouth daily. 90 tablet 0   metoCLOPramide (REGLAN) 10 MG tablet Take 1 tablet (10 mg total) by mouth daily. 30 tablet 0   Omega-3 Fatty Acids (FISH OIL PO) Take 1 capsule by mouth daily.     omeprazole (PRILOSEC) 40 MG capsule TAKE 1 CAPSULE BY MOUTH EVERY DAY 90 capsule 3   ondansetron (ZOFRAN) 4 MG tablet Take 1 tablet (4 mg total) by mouth every 8 (eight) hours as needed for nausea or vomiting. 20 tablet 0   ONETOUCH ULTRA test strip SMARTSIG:6 Via Meter 6 Times Daily (Patient not taking: No sig reported)     Rimegepant Sulfate (NURTEC) 75 MG TBDP Take 75 mg by mouth once as needed  for up to 1 dose. 30 tablet 0   rosuvastatin (CRESTOR) 40 MG tablet Take 40 mg by mouth daily.     tolterodine (DETROL LA) 2 MG 24 hr capsule Take 1 capsule (2 mg total) by mouth at bedtime. 30 capsule 2   venlafaxine XR (EFFEXOR-XR) 150 MG 24 hr capsule TAKE 1 CAPSULE BY MOUTH EVERY DAY 90 capsule 2   Vitamin D, Ergocalciferol, (DRISDOL) 1.25 MG (50000 UNIT) CAPS capsule TAKE 1 CAPSULE BY MOUTH EACH WEEK 12 capsule 3   No current facility-administered medications on file prior to visit.    Allergies  Allergen Reactions   Lisinopril Cough    Objective:  General: Alert and oriented x3 in no acute distress  Dermatology: No open lesions bilateral lower  extremities, no webspace macerations, resolved ecchymosis improving in nature, all nails x 10 are well manicured.  Vascular: Decreased focal edema noted to right foot. Dorsalis Pedis and Posterior Tibial pedal pulses 1/4, Capillary Fill Time 3 seconds,(+) pedal hair growth bilateral, Temperature gradient within normal limits.  Neurology: Johney Maine sensation intact via light touch bilateral.  Musculoskeletal: There is minimal tenderness with palpation at Right heel, no calf compression bilateral, Strength within normal limits in all groups.  Assessment and Plan: Problem List Items Addressed This Visit   None Visit Diagnoses     Closed nondisplaced fracture of right calcaneus with routine healing, unspecified portion of calcaneus, subsequent encounter    -  Primary   Relevant Orders   DG Foot Complete Right   Pain of right heel           -Complete examination performed -Xrays reviewed consistent with nondisplaced calcaneal fracture extra-articular that is healing almost completely consolidated -Re-Discussed treatment options for fracture; risks, alternatives, and benefits explained. -Advised patient that she may slowly transition out of the cam boot to a stiff soled hiking boot or hightop tennis shoe with thick sole -Recommend protection, rest, ice, elevation daily until symptoms have completely resolved -Continue with Tylenol as needed for pain -Return as scheduled or final follow up xrays for right heel fracture.   Landis Martins, DPM

## 2021-10-20 ENCOUNTER — Other Ambulatory Visit: Payer: Self-pay | Admitting: Family Medicine

## 2021-10-22 ENCOUNTER — Telehealth: Payer: Self-pay | Admitting: Sports Medicine

## 2021-10-22 ENCOUNTER — Other Ambulatory Visit: Payer: Self-pay | Admitting: Sports Medicine

## 2021-10-22 MED ORDER — HYDROCODONE-ACETAMINOPHEN 5-325 MG PO TABS: 1.0000 | ORAL_TABLET | Freq: Four times a day (QID) | ORAL | 0 refills | Status: AC | PRN

## 2021-10-22 NOTE — Telephone Encounter (Signed)
Pt was seen Friday & states tylenol is not strong enough & would like something for pain [facility]

## 2021-10-22 NOTE — Progress Notes (Signed)
Pain meds sent to pharmacy.

## 2021-10-23 NOTE — Telephone Encounter (Signed)
10-22-21 unable to lv msg/reb  10-23-21 pt notified/reb

## 2021-10-25 ENCOUNTER — Other Ambulatory Visit: Payer: Self-pay

## 2021-10-25 ENCOUNTER — Encounter: Payer: Self-pay | Admitting: Family Medicine

## 2021-10-25 ENCOUNTER — Ambulatory Visit (INDEPENDENT_AMBULATORY_CARE_PROVIDER_SITE_OTHER): Payer: PPO | Admitting: Family Medicine

## 2021-10-25 VITALS — BP 130/68 | HR 97 | Temp 97.3°F | Ht 64.0 in | Wt 153.0 lb

## 2021-10-25 DIAGNOSIS — Z780 Asymptomatic menopausal state: Secondary | ICD-10-CM

## 2021-10-25 DIAGNOSIS — H6122 Impacted cerumen, left ear: Secondary | ICD-10-CM

## 2021-10-25 DIAGNOSIS — Z13820 Encounter for screening for osteoporosis: Secondary | ICD-10-CM | POA: Diagnosis not present

## 2021-10-25 HISTORY — DX: Impacted cerumen, left ear: H61.22

## 2021-10-25 NOTE — Progress Notes (Signed)
Acute Office Visit  Subjective:    Patient ID: [name], female    DOB: [birth year], 70 y.o.   MRN: [id number]  Chief Complaint  Patient presents with   Left ear impaction    HPI Patient is in today for left cerumen irrigation.  The patient was seen by audiology and they were unable to assess her left ear.  The patient has had hearing aids in the past and unfortunately had lost them.  She apparently have lost the left 1.  She was referred to her primary care so we would be able to clean out her ear.  In addition the patient asked about a bone density test.  It has been just over 2 years since her last one.  She is currently on Fosamax for osteoporosis.  Past Medical History:  Diagnosis Date   Depression    Diabetes mellitus without complication (HCC)    GERD (gastroesophageal reflux disease)    Hypertension    Hypothyroidism    Stroke ([place])    Type 1 diabetes ([place])     Past Surgical History:  Procedure Laterality Date   APPENDECTOMY     APPLICATION OF WOUND VAC Left 08/03/2019   Procedure: Application Of Wound Vac;  Surgeon: Altamese Salix, MD;  Location: [facility];  Service: Orthopedics;  Laterality: Left;   CESAREAN SECTION     ESOPHAGOGASTRODUODENOSCOPY (EGD) WITH PROPOFOL N/A 02/02/2016   Procedure: ESOPHAGOGASTRODUODENOSCOPY (EGD) WITH PROPOFOL;  Surgeon: Josefine Class, MD;  Location: [facility] ENDOSCOPY;  Service: Endoscopy;  Laterality: N/A;   HAND SURGERY     JOINT REPLACEMENT Bilateral    Knee    ORIF ANKLE FRACTURE Left 08/03/2019   with wound vac applied    ORIF ANKLE FRACTURE Left 08/03/2019   Procedure: OPEN REDUCTION INTERNAL FIXATION (ORIF) ANKLE FRACTURE;  Surgeon: Altamese Hockley, MD;  Location: [facility];  Service: Orthopedics;  Laterality: Left;   TIBIALIS TENDON TRANSFER / REPAIR      Family History  Problem Relation Age of Onset   Cancer Mother        Colon cancer    Dementia Father    Neuropathy Father     Social History   Socioeconomic History    Marital status: Single    Spouse name: Not on file   Number of children: Not on file   Years of education: Not on file   Highest education level: Not on file  Occupational History   Not on file  Tobacco Use   Smoking status: Never   Smokeless tobacco: Never  Vaping Use   Vaping Use: Never used  Substance and Sexual Activity   Alcohol use: Yes    Alcohol/week: 1.0 standard drink    Types: 1 Glasses of wine per week    Comment: everyday   Drug use: Never   Sexual activity: Not Currently  Other Topics Concern   Not on file  Social History Narrative   Right hand   Lives alone   Social Determinants of Health   Financial Resource Strain: Not on file  Food Insecurity: Not on file  Transportation Needs: Not on file  Physical Activity: Not on file  Stress: Not on file  Social Connections: Not on file  Intimate Partner Violence: Not on file    Outpatient Medications Prior to Visit  Medication Sig Dispense Refill   acyclovir (ZOVIRAX) 800 MG tablet TAKE 1 TABLET (800 MG) BY MOUTH 4 TIMES PER DAY FOR 3-7 DAYS AS NEEDED 30 tablet  2   amLODipine (NORVASC) 10 MG tablet TAKE 1 TABLET BY MOUTH EVERY DAY 90 tablet 3   aspirin EC 81 MG tablet Take 81 mg by mouth 2 (two) times a week.      buPROPion (WELLBUTRIN XL) 150 MG 24 hr tablet TAKE 1 TABLET BY MOUTH EVERY DAY 90 tablet 3   clopidogrel (PLAVIX) 75 MG tablet Take 1 tablet (75 mg total) by mouth daily. 14 tablet 0   gabapentin (NEURONTIN) 100 MG capsule Take 2 capsules (200 mg total) by mouth at bedtime. 180 capsule 1   GABAPENTIN PO Take 2 capsules by mouth daily.     HYDROcodone-acetaminophen (NORCO) 5-325 MG tablet Take 1 tablet by mouth every 6 (six) hours as needed for up to 5 days for moderate pain. 20 tablet 0   insulin aspart (NOVOLOG) 100 UNIT/ML injection Inject 2-3 Units into the skin 3 (three) times daily before meals. Inject 6 units every am, 6 units at noon and 3 units at bedtime.      insulin glargine (LANTUS) 100  UNIT/ML injection Inject 0.15 mLs (15 Units total) into the skin every evening. Reported on 02/02/2016 10 mL 1   levothyroxine (SYNTHROID) 125 MCG tablet Take 125 mcg by mouth daily.     LORazepam (ATIVAN) 0.5 MG tablet TAKE 1 TABLET BY MOUTH EVERY DAY AS NEEDED FOR ANXIETY 30 tablet 3   losartan (COZAAR) 50 MG tablet Take 1 tablet (50 mg total) by mouth daily. 90 tablet 0   metoCLOPramide (REGLAN) 10 MG tablet Take 1 tablet (10 mg total) by mouth daily. 30 tablet 0   Omega-3 Fatty Acids (FISH OIL PO) Take 1 capsule by mouth daily.     omeprazole (PRILOSEC) 40 MG capsule TAKE 1 CAPSULE BY MOUTH EVERY DAY 90 capsule 3   ondansetron (ZOFRAN) 4 MG tablet Take 1 tablet (4 mg total) by mouth every 8 (eight) hours as needed for nausea or vomiting. 20 tablet 0   ONETOUCH ULTRA test strip SMARTSIG:6 Via Meter 6 Times Daily     Rimegepant Sulfate (NURTEC) 75 MG TBDP Take 75 mg by mouth once as needed for up to 1 dose. 30 tablet 0   rosuvastatin (CRESTOR) 40 MG tablet Take 40 mg by mouth daily.     tolterodine (DETROL LA) 2 MG 24 hr capsule Take 1 capsule (2 mg total) by mouth at bedtime. 30 capsule 2   venlafaxine XR (EFFEXOR-XR) 150 MG 24 hr capsule TAKE 1 CAPSULE BY MOUTH EVERY DAY 90 capsule 2   Vitamin D, Ergocalciferol, (DRISDOL) 1.25 MG (50000 UNIT) CAPS capsule TAKE 1 CAPSULE BY MOUTH EACH WEEK 12 capsule 3   levothyroxine (SYNTHROID) 137 MCG tablet TAKE 1 TABLET BY MOUTH EVERY DAY BEFORE BREAKFAST 90 tablet 1   No facility-administered medications prior to visit.    Allergies  Allergen Reactions   Lisinopril Cough    Review of Systems  Constitutional:  Negative for appetite change, fatigue and fever.  HENT:  Positive for ear pain (Left ear impacted). Negative for congestion, sinus pressure and sore throat.   Eyes:  Negative for pain.  Respiratory:  Negative for cough, chest tightness, shortness of breath and wheezing.   Cardiovascular:  Negative for chest pain and palpitations.   Gastrointestinal:  Negative for abdominal pain, constipation, diarrhea, nausea and vomiting.  Genitourinary:  Negative for dysuria and hematuria.  Musculoskeletal:  Negative for arthralgias, back pain, joint swelling and myalgias.  Skin:  Negative for rash.  Neurological:  Negative for  dizziness, weakness and headaches.  Psychiatric/Behavioral:  Negative for dysphoric mood. The patient is not nervous/anxious.       Objective:    Physical Exam Vitals reviewed.  Constitutional:      Appearance: Normal appearance.  HENT:     Right Ear: Tympanic membrane normal.     Left Ear: There is impacted cerumen.  Neurological:     Mental Status: She is alert.    BP 130/68 (BP Location: Right Arm, Patient Position: Sitting)   Pulse 97   Temp (!) 97.3 F (36.3 C) (Temporal)   Ht 5' 4"  (1.626 m)   Wt 153 lb (69.4 kg)   SpO2 97%   BMI 26.26 kg/m  Wt Readings from Last 3 Encounters:  10/25/21 153 lb (69.4 kg)  06/05/21 150 lb (68 kg)  01/22/21 142 lb (64.4 kg)    Health Maintenance Due  Topic Date Due   OPHTHALMOLOGY EXAM  Never done   Hepatitis C Screening  Never done   DEXA SCAN  Never done   COVID-19 Vaccine (5 - Booster) 04/13/2021   INFLUENZA VACCINE  06/18/2021   Pneumonia Vaccine 69+ Years old (2 - PPSV23 if available, else PCV20) 07/03/2021   HEMOGLOBIN A1C  10/17/2021    There are no preventive care reminders to display for this patient.   Lab Results  Component Value Date   TSH 0.707 06/05/2021   Lab Results  Component Value Date   WBC 5.6 04/17/2021   HGB 12.6 04/17/2021   HCT 37.9 04/17/2021   MCV 92 04/17/2021   PLT 348 04/17/2021   Lab Results  Component Value Date   NA 136 04/17/2021   K 4.3 04/17/2021   CO2 23 04/17/2021   GLUCOSE 84 04/17/2021   BUN 20 04/17/2021   CREATININE 0.88 04/17/2021   BILITOT <0.2 04/17/2021   ALKPHOS 123 (H) 04/17/2021   AST 16 04/17/2021   ALT 16 04/17/2021   PROT 6.7 04/17/2021   ALBUMIN 4.4 04/17/2021   CALCIUM  9.2 04/17/2021   ANIONGAP 9 08/05/2019   EGFR 70 04/17/2021   Lab Results  Component Value Date   CHOL 245 (H) 04/17/2021   Lab Results  Component Value Date   HDL 119 04/17/2021   Lab Results  Component Value Date   LDLCALC 111 (H) 04/17/2021   Lab Results  Component Value Date   TRIG 88 04/17/2021   Lab Results  Component Value Date   CHOLHDL 2.1 04/17/2021   Lab Results  Component Value Date   HGBA1C 8.5 (H) 04/17/2021         Assessment & Plan:   Problem List Items Addressed This Visit       Nervous and Auditory   Hearing loss of left ear due to cerumen impaction - Primary    Successful irrigation of left ear.        Other   Osteoporosis screening    Ordering a bone density          I,Lauren M Auman,acting as a scribe for Rochel Brome, MD.,have documented all relevant documentation on the behalf of Rochel Brome, MD,as directed by  Rochel Brome, MD while in the presence of Rochel Brome, MD.    Rochel Brome, MD

## 2021-10-25 NOTE — Assessment & Plan Note (Signed)
Ordering a bone density

## 2021-10-25 NOTE — Assessment & Plan Note (Signed)
Successful irrigation of left ear.

## 2021-10-29 ENCOUNTER — Ambulatory Visit: Payer: PPO | Admitting: Neurology

## 2021-11-02 ENCOUNTER — Encounter: Payer: Self-pay | Admitting: Family Medicine

## 2021-11-02 ENCOUNTER — Other Ambulatory Visit: Payer: PPO

## 2021-11-02 ENCOUNTER — Other Ambulatory Visit: Payer: Self-pay

## 2021-11-02 DIAGNOSIS — E1065 Type 1 diabetes mellitus with hyperglycemia: Secondary | ICD-10-CM

## 2021-11-03 LAB — BASIC METABOLIC PANEL
BUN/Creatinine Ratio: 19 (ref 12–28)
BUN: 16 mg/dL (ref 8–27)
CO2: 24 mmol/L (ref 20–29)
Calcium: 9.7 mg/dL (ref 8.7–10.3)
Chloride: 98 mmol/L (ref 96–106)
Creatinine, Ser: 0.85 mg/dL (ref 0.57–1.00)
Glucose: 119 mg/dL — ABNORMAL HIGH (ref 70–99)
Potassium: 4.2 mmol/L (ref 3.5–5.2)
Sodium: 138 mmol/L (ref 134–144)
eGFR: 73 mL/min/{1.73_m2} (ref >59)

## 2021-11-03 LAB — HEMOGLOBIN A1C
Est. average glucose Bld gHb Est-mCnc: 174 mg/dL
Hgb A1c MFr Bld: 7.7 % — ABNORMAL HIGH (ref 4.8–5.6)

## 2021-11-03 LAB — TSH: TSH: 0.491 u[IU]/mL (ref 0.450–4.500)

## 2021-11-06 ENCOUNTER — Ambulatory Visit: Payer: PPO | Admitting: Sports Medicine

## 2021-11-06 ENCOUNTER — Ambulatory Visit: Payer: PPO | Admitting: Family Medicine

## 2021-11-10 DIAGNOSIS — E1065 Type 1 diabetes mellitus with hyperglycemia: Secondary | ICD-10-CM | POA: Diagnosis not present

## 2021-11-10 DIAGNOSIS — E10319 Type 1 diabetes mellitus with unspecified diabetic retinopathy without macular edema: Secondary | ICD-10-CM | POA: Diagnosis not present

## 2021-11-18 ENCOUNTER — Other Ambulatory Visit: Payer: Self-pay | Admitting: Family Medicine

## 2021-11-18 DIAGNOSIS — T464X5A Adverse effect of angiotensin-converting-enzyme inhibitors, initial encounter: Secondary | ICD-10-CM

## 2021-11-18 DIAGNOSIS — E1065 Type 1 diabetes mellitus with hyperglycemia: Secondary | ICD-10-CM

## 2021-11-21 ENCOUNTER — Encounter: Payer: Self-pay | Admitting: Family Medicine

## 2021-11-23 ENCOUNTER — Ambulatory Visit (INDEPENDENT_AMBULATORY_CARE_PROVIDER_SITE_OTHER): Payer: HMO

## 2021-11-23 ENCOUNTER — Other Ambulatory Visit: Payer: Self-pay

## 2021-11-23 ENCOUNTER — Ambulatory Visit: Payer: HMO | Admitting: Sports Medicine

## 2021-11-23 DIAGNOSIS — S92001D Unspecified fracture of right calcaneus, subsequent encounter for fracture with routine healing: Secondary | ICD-10-CM | POA: Diagnosis not present

## 2021-11-23 DIAGNOSIS — R2681 Unsteadiness on feet: Secondary | ICD-10-CM | POA: Diagnosis not present

## 2021-11-23 DIAGNOSIS — M79671 Pain in right foot: Secondary | ICD-10-CM | POA: Diagnosis not present

## 2021-11-23 LAB — HM DEXA SCAN

## 2021-11-23 NOTE — Progress Notes (Signed)
Subjective: [name] is a 71 y.o. female patient who returns to office for follow-up evaluation of Right foot/heel pain secondary to calcaneal fracture that occurred on 07/29/2021. Patient reports that she is doing better but is having issues with her balance currently in a normal shoe.  Patient Active Problem List   Diagnosis Date Noted   Hearing loss of left ear due to cerumen impaction 10/25/2021   Osteoporosis screening 09/30/2021   Cough due to ACE inhibitor 09/30/2021   Type 1 diabetes mellitus with hyperglycemia ([place]) 09/30/2021   Snoring 09/30/2021   Nocturia 09/30/2021   CVA (cerebral vascular accident) ([place]) 12/19/2020   Well adult exam 07/03/2020   Left tibial fracture 08/03/2019   Capsulitis of metatarsophalangeal (MTP) joint of left foot 01/22/2018   Memory changes 07/04/2016   Asthma 08/14/2015   Laryngopharyngeal reflux (LPR) 08/14/2015   Rhinitis 08/14/2015   Acute respiratory failure with hypoxemia ([place]) 04/20/2014   Primary localized osteoarthrosis, lower leg 04/18/2014   Essential hypertension 04/15/2014    Current Outpatient Medications on File Prior to Visit  Medication Sig Dispense Refill   acyclovir (ZOVIRAX) 800 MG tablet TAKE 1 TABLET (800 MG) BY MOUTH 4 TIMES PER DAY FOR 3-7 DAYS AS NEEDED 30 tablet 2   amLODipine (NORVASC) 10 MG tablet TAKE 1 TABLET BY MOUTH EVERY DAY 90 tablet 3   aspirin EC 81 MG tablet Take 81 mg by mouth 2 (two) times a week.      buPROPion (WELLBUTRIN XL) 150 MG 24 hr tablet TAKE 1 TABLET BY MOUTH EVERY DAY 90 tablet 3   clopidogrel (PLAVIX) 75 MG tablet Take 1 tablet (75 mg total) by mouth daily. 14 tablet 0   gabapentin (NEURONTIN) 100 MG capsule Take 2 capsules (200 mg total) by mouth at bedtime. 180 capsule 1   GABAPENTIN PO Take 2 capsules by mouth daily.     insulin aspart (NOVOLOG) 100 UNIT/ML injection Inject 2-3 Units into the skin 3 (three) times daily before meals. Inject 6 units every am, 6 units at noon and 3 units  at bedtime.      insulin glargine (LANTUS) 100 UNIT/ML injection Inject 0.15 mLs (15 Units total) into the skin every evening. Reported on 02/02/2016 10 mL 1   levothyroxine (SYNTHROID) 125 MCG tablet Take 125 mcg by mouth daily.     LORazepam (ATIVAN) 0.5 MG tablet TAKE 1 TABLET BY MOUTH EVERY DAY AS NEEDED FOR ANXIETY 30 tablet 3   losartan (COZAAR) 50 MG tablet TAKE 1 TABLET BY MOUTH EVERY DAY 90 tablet 1   metoCLOPramide (REGLAN) 10 MG tablet Take 1 tablet (10 mg total) by mouth daily. 30 tablet 0   Omega-3 Fatty Acids (FISH OIL PO) Take 1 capsule by mouth daily.     omeprazole (PRILOSEC) 40 MG capsule TAKE 1 CAPSULE BY MOUTH EVERY DAY 90 capsule 3   ondansetron (ZOFRAN) 4 MG tablet Take 1 tablet (4 mg total) by mouth every 8 (eight) hours as needed for nausea or vomiting. 20 tablet 0   ONETOUCH ULTRA test strip SMARTSIG:6 Via Meter 6 Times Daily     Rimegepant Sulfate (NURTEC) 75 MG TBDP Take 75 mg by mouth once as needed for up to 1 dose. 30 tablet 0   rosuvastatin (CRESTOR) 40 MG tablet Take 40 mg by mouth daily.     tolterodine (DETROL LA) 2 MG 24 hr capsule Take 1 capsule (2 mg total) by mouth at bedtime. 30 capsule 2   venlafaxine XR (EFFEXOR-XR) 150  MG 24 hr capsule TAKE 1 CAPSULE BY MOUTH EVERY DAY 90 capsule 2   Vitamin D, Ergocalciferol, (DRISDOL) 1.25 MG (50000 UNIT) CAPS capsule TAKE 1 CAPSULE BY MOUTH EACH WEEK 12 capsule 3   No current facility-administered medications on file prior to visit.    Allergies  Allergen Reactions   Lisinopril Cough    Objective:  General: Alert and oriented x3 in no acute distress  Dermatology: No open lesions bilateral lower extremities, no webspace macerations, resolved ecchymosis improving in nature, all nails x 10 are well manicured.  Vascular: Decreased focal edema noted to right foot. Dorsalis Pedis and Posterior Tibial pedal pulses 1/4, Capillary Fill Time 3 seconds,(+) pedal hair growth bilateral, Temperature gradient within normal  limits.  Neurology: Johney Maine sensation intact via light touch bilateral.  Musculoskeletal: There is no significant reproducible tenderness with palpation at Right heel, no calf compression bilateral, Strength within normal limits in all groups.  X-rays consistent with prematurely healed calcaneal fracture  Assessment and Plan: Problem List Items Addressed This Visit   None Visit Diagnoses     Closed nondisplaced fracture of right calcaneus with routine healing, unspecified portion of calcaneus, subsequent encounter    -  Primary   Relevant Orders   DG Foot Complete Right   Ambulatory referral to Physical Therapy   Gait instability       Relevant Orders   Ambulatory referral to Physical Therapy   Pain of right heel       Relevant Orders   Ambulatory referral to Physical Therapy        -Complete examination performed -Xrays reviewed  -Re-Discussed treatment options for prematurely healed fracture; risks, alternatives, and benefits explained. -Advised patient to continue with good supportive shoes and to limit activities to tolerance only walking for exercise on even surfaces I did prescribe patient to have physical therapy to assist with her instability and balance at [place] with Tylenol as needed for pain and compression elevation as needed for edema however patient is noted to be doing well at this visit with very minimal swelling -Return as needed or if symptoms fail to continue to improve  Landis Martins, DPM

## 2021-11-26 DIAGNOSIS — G319 Degenerative disease of nervous system, unspecified: Secondary | ICD-10-CM | POA: Diagnosis not present

## 2021-11-26 DIAGNOSIS — I10 Essential (primary) hypertension: Secondary | ICD-10-CM | POA: Diagnosis not present

## 2021-11-26 DIAGNOSIS — R42 Dizziness and giddiness: Secondary | ICD-10-CM | POA: Diagnosis not present

## 2021-11-26 DIAGNOSIS — R2681 Unsteadiness on feet: Secondary | ICD-10-CM | POA: Diagnosis not present

## 2021-11-26 DIAGNOSIS — E11649 Type 2 diabetes mellitus with hypoglycemia without coma: Secondary | ICD-10-CM | POA: Diagnosis not present

## 2021-11-26 DIAGNOSIS — R531 Weakness: Secondary | ICD-10-CM | POA: Diagnosis not present

## 2021-11-26 DIAGNOSIS — E10649 Type 1 diabetes mellitus with hypoglycemia without coma: Secondary | ICD-10-CM | POA: Diagnosis not present

## 2021-11-26 DIAGNOSIS — R262 Difficulty in walking, not elsewhere classified: Secondary | ICD-10-CM | POA: Diagnosis not present

## 2021-11-26 DIAGNOSIS — K449 Diaphragmatic hernia without obstruction or gangrene: Secondary | ICD-10-CM | POA: Diagnosis not present

## 2021-11-26 DIAGNOSIS — Z20822 Contact with and (suspected) exposure to covid-19: Secondary | ICD-10-CM | POA: Diagnosis not present

## 2021-11-28 ENCOUNTER — Ambulatory Visit (INDEPENDENT_AMBULATORY_CARE_PROVIDER_SITE_OTHER): Payer: HMO | Admitting: Family Medicine

## 2021-11-28 VITALS — BP 136/80 | HR 92 | Temp 96.0°F | Ht 63.0 in | Wt 152.0 lb

## 2021-11-28 DIAGNOSIS — R270 Ataxia, unspecified: Secondary | ICD-10-CM

## 2021-11-28 DIAGNOSIS — E039 Hypothyroidism, unspecified: Secondary | ICD-10-CM | POA: Diagnosis not present

## 2021-11-28 DIAGNOSIS — R410 Disorientation, unspecified: Secondary | ICD-10-CM

## 2021-11-28 DIAGNOSIS — R400 Somnolence: Secondary | ICD-10-CM

## 2021-11-28 DIAGNOSIS — R0683 Snoring: Secondary | ICD-10-CM

## 2021-11-28 MED ORDER — LEVOTHYROXINE SODIUM 137 MCG PO TABS: 137.0000 ug | ORAL_TABLET | Freq: Every day | ORAL | 0 refills | Status: DC

## 2021-11-28 NOTE — Progress Notes (Signed)
Subjective:  Patient ID: [name], female    DOB: [birth year]  Age: 71 y.o. MRN: [id number]  Chief Complaint  Patient presents with   Hospitalization Follow-up   Hypoglycemia    HPI Follow up Hospitalization Patient was seen at [facility] ED. Pt is a type 1 diabetic, hypertension, hyperlipidemia, hypothyroidism, GERD and history of stroke in  who presented to ED on 11/26/2020 with drowsiness, poor mental status, and staggery for 2 days.  Her son has noticed similar symptoms in the last 2 weeks intermittently. She had ct scan and mri of brain neither of which showed strokes. EKG and CXR normal. Final diagnosis was hypoglycemia. She was negative for rsv, flu, and covid.  Dexcom varied from ED sugars quite a bit. Dexcom showed lower.  This dexcom applied on   Current Outpatient Medications on File Prior to Visit  Medication Sig Dispense Refill   acyclovir (ZOVIRAX) 800 MG tablet TAKE 1 TABLET (800 MG) BY MOUTH 4 TIMES PER DAY FOR 3-7 DAYS AS NEEDED 30 tablet 2   amLODipine (NORVASC) 10 MG tablet TAKE 1 TABLET BY MOUTH EVERY DAY 90 tablet 3   aspirin EC 81 MG tablet Take 81 mg by mouth 2 (two) times a week.      buPROPion (WELLBUTRIN XL) 150 MG 24 hr tablet TAKE 1 TABLET BY MOUTH EVERY DAY 90 tablet 3   clopidogrel (PLAVIX) 75 MG tablet Take 1 tablet (75 mg total) by mouth daily. 14 tablet 0   gabapentin (NEURONTIN) 100 MG capsule Take 2 capsules (200 mg total) by mouth at bedtime. 180 capsule 1   GABAPENTIN PO Take 2 capsules by mouth daily.     insulin aspart (NOVOLOG) 100 UNIT/ML injection Inject 2-3 Units into the skin 3 (three) times daily before meals. Inject 6 units every am, 6 units at noon and 3 units at bedtime.      insulin glargine (LANTUS) 100 UNIT/ML injection Inject 0.15 mLs (15 Units total) into the skin every evening. Reported on 02/02/2016 10 mL 1   LORazepam (ATIVAN) 0.5 MG tablet TAKE 1 TABLET BY MOUTH EVERY DAY AS NEEDED FOR ANXIETY 30 tablet 3   losartan (COZAAR) 50 MG  tablet TAKE 1 TABLET BY MOUTH EVERY DAY 90 tablet 1   metoCLOPramide (REGLAN) 10 MG tablet Take 1 tablet (10 mg total) by mouth daily. 30 tablet 0   Omega-3 Fatty Acids (FISH OIL PO) Take 1 capsule by mouth daily.     omeprazole (PRILOSEC) 40 MG capsule TAKE 1 CAPSULE BY MOUTH EVERY DAY 90 capsule 3   ondansetron (ZOFRAN) 4 MG tablet Take 1 tablet (4 mg total) by mouth every 8 (eight) hours as needed for nausea or vomiting. 20 tablet 0   ONETOUCH ULTRA test strip SMARTSIG:6 Via Meter 6 Times Daily     Rimegepant Sulfate (NURTEC) 75 MG TBDP Take 75 mg by mouth once as needed for up to 1 dose. 30 tablet 0   rosuvastatin (CRESTOR) 40 MG tablet Take 40 mg by mouth daily.     tolterodine (DETROL LA) 2 MG 24 hr capsule Take 1 capsule (2 mg total) by mouth at bedtime. 30 capsule 2   venlafaxine XR (EFFEXOR-XR) 150 MG 24 hr capsule TAKE 1 CAPSULE BY MOUTH EVERY DAY 90 capsule 2   Vitamin D, Ergocalciferol, (DRISDOL) 1.25 MG (50000 UNIT) CAPS capsule TAKE 1 CAPSULE BY MOUTH EACH WEEK 12 capsule 3   No current facility-administered medications on file prior to visit.   Past Medical  History:  Diagnosis Date   Depression    Diabetes mellitus without complication ([place])    GERD (gastroesophageal reflux disease)    Hypertension    Hypothyroidism    Stroke ([place])    Type 1 diabetes ([place])    Past Surgical History:  Procedure Laterality Date   APPENDECTOMY     APPLICATION OF WOUND VAC Left 08/03/2019   Procedure: Application Of Wound Vac;  Surgeon: Altamese Sigel, MD;  Location: [facility];  Service: Orthopedics;  Laterality: Left;   CESAREAN SECTION     ESOPHAGOGASTRODUODENOSCOPY (EGD) WITH PROPOFOL N/A 02/02/2016   Procedure: ESOPHAGOGASTRODUODENOSCOPY (EGD) WITH PROPOFOL;  Surgeon: Josefine Class, MD;  [facility] ENDOSCOPY;  Service: Endoscopy;  Laterality: N/A;   HAND SURGERY     JOINT REPLACEMENT Bilateral    Knee    ORIF ANKLE FRACTURE Left 08/03/2019   with wound vac applied    ORIF ANKLE  FRACTURE Left 08/03/2019   Procedure: OPEN REDUCTION INTERNAL FIXATION (ORIF) ANKLE FRACTURE;  Surgeon: Altamese [place] Bend, MD;  Location: [facility];  Service: Orthopedics;  Laterality: Left;   TIBIALIS TENDON TRANSFER / REPAIR      Family History  Problem Relation Age of Onset   Cancer Mother        Colon cancer    Dementia Father    Neuropathy Father    Social History   Socioeconomic History   Marital status: Single    Spouse name: Not on file   Number of children: Not on file   Years of education: Not on file   Highest education level: Not on file  Occupational History   Not on file  Tobacco Use   Smoking status: Never   Smokeless tobacco: Never  Vaping Use   Vaping Use: Never used  Substance and Sexual Activity   Alcohol use: Yes    Alcohol/week: 1.0 standard drink    Types: 1 Glasses of wine per week    Comment: everyday   Drug use: Never   Sexual activity: Not Currently  Other Topics Concern   Not on file  Social History Narrative   Right hand   Lives alone   Social Determinants of Health   Financial Resource Strain: Not on file  Food Insecurity: Not on file  Transportation Needs: Not on file  Physical Activity: Not on file  Stress: Not on file  Social Connections: Not on file    Review of Systems  Constitutional:  Negative for chills, fatigue and fever.  HENT:  Negative for congestion, ear pain, rhinorrhea and sore throat.   Respiratory:  Negative for cough and shortness of breath.   Cardiovascular:  Negative for chest pain.  Gastrointestinal:  Negative for abdominal pain, constipation, diarrhea, nausea and vomiting.  Genitourinary:  Negative for dysuria and urgency.  Musculoskeletal:  Positive for arthralgias, back pain (Left) and myalgias.  Neurological:  Positive for dizziness. Negative for weakness, light-headedness and headaches.  Psychiatric/Behavioral:  Negative for dysphoric mood. The patient is not nervous/anxious.     Objective:  BP 136/80     Pulse 92    Temp (!) 96 F (35.6 C)    Ht 5\' 3"  (1.6 m)    Wt 152 lb (68.9 kg)    SpO2 99%    BMI 26.93 kg/m   BP/Weight 11/28/2021 10/25/2021 02/40/9735  Systolic BP 329 924 268  Diastolic BP 80 68 64  Wt. (Lbs) 152 153 -  BMI 26.93 26.26 25.75    Physical Exam Vitals reviewed.  Constitutional:      Appearance: Normal appearance. She is normal weight.  Neck:     Vascular: No carotid bruit.  Cardiovascular:     Rate and Rhythm: Normal rate and regular rhythm.     Heart sounds: Normal heart sounds.  Pulmonary:     Effort: Pulmonary effort is normal. No respiratory distress.     Breath sounds: Normal breath sounds.  Abdominal:     General: Abdomen is flat. Bowel sounds are normal.     Palpations: Abdomen is soft.     Tenderness: There is no abdominal tenderness.  Neurological:     Mental Status: She is alert and oriented to person, place, and time.  Psychiatric:        Mood and Affect: Mood normal.        Behavior: Behavior normal.    Diabetic Foot Exam - Simple   No data filed      Lab Results  Component Value Date   WBC 5.6 04/17/2021   HGB 12.6 04/17/2021   HCT 37.9 04/17/2021   PLT 348 04/17/2021   GLUCOSE 119 (H) 11/02/2021   CHOL 245 (H) 04/17/2021   TRIG 88 04/17/2021   HDL 119 04/17/2021   LDLCALC 111 (H) 04/17/2021   ALT 16 04/17/2021   AST 16 04/17/2021   NA 138 11/02/2021   K 4.2 11/02/2021   CL 98 11/02/2021   CREATININE 0.85 11/02/2021   BUN 16 11/02/2021   CO2 24 11/02/2021   TSH 16.300 (H) 11/28/2021   INR 0.9 08/03/2019   HGBA1C 7.7 (H) 11/02/2021      Assessment & Plan:   Problem List Items Addressed This Visit       Endocrine   Acquired hypothyroidism    Check tsh, free T4.  Abnormal. Increase synthroid to 137 mcg once daily in am.      Relevant Orders   T4, free (Completed)   TSH (Completed)     Nervous and Auditory   Confusion - Primary    Refer to neurology.      Relevant Orders   Carbon Monoxide, Blood (Completed)    Ambulatory referral to Neurology     Other   Snoring    Order sleep study      Relevant Orders   Home sleep test   Ataxia    Refer to neurology      Relevant Orders   Ambulatory referral to Neurology   Daytime somnolence    Check sleep study.       Relevant Orders   Home sleep test  .  Meds ordered this encounter  Medications   DISCONTD: levothyroxine (SYNTHROID) 137 MCG tablet    Sig: Take 1 tablet (137 mcg total) by mouth daily before breakfast.    Dispense:  90 tablet    Refill:  0    Orders Placed This Encounter  Procedures   Carbon Monoxide, Blood   T4, free   TSH   Ambulatory referral to Neurology   Home sleep test     Follow-up: Return in about 4 weeks (around 12/26/2021) for chronic follow up.  An After Visit Summary was printed and given to the patient.  Rochel Brome, MD [facility] [phone]

## 2021-11-29 DIAGNOSIS — M25511 Pain in right shoulder: Secondary | ICD-10-CM | POA: Diagnosis not present

## 2021-11-29 DIAGNOSIS — G8929 Other chronic pain: Secondary | ICD-10-CM | POA: Diagnosis not present

## 2021-11-29 DIAGNOSIS — M19011 Primary osteoarthritis, right shoulder: Secondary | ICD-10-CM | POA: Diagnosis not present

## 2021-11-29 LAB — TSH: TSH: 16.3 u[IU]/mL — ABNORMAL HIGH (ref 0.450–4.500)

## 2021-11-29 LAB — CARBON MONOXIDE, BLOOD (PERFORMED AT REF LAB): CARBON MONOXIDE, BLOOD: 2.7 % (ref 0.0–3.6)

## 2021-11-29 LAB — T4, FREE: Free T4: 0.9 ng/dL (ref 0.82–1.77)

## 2021-11-30 ENCOUNTER — Telehealth: Payer: Self-pay

## 2021-11-30 DIAGNOSIS — K5909 Other constipation: Secondary | ICD-10-CM | POA: Diagnosis not present

## 2021-11-30 DIAGNOSIS — R1110 Vomiting, unspecified: Secondary | ICD-10-CM | POA: Diagnosis not present

## 2021-11-30 DIAGNOSIS — K2900 Acute gastritis without bleeding: Secondary | ICD-10-CM | POA: Diagnosis not present

## 2021-11-30 DIAGNOSIS — R1013 Epigastric pain: Secondary | ICD-10-CM | POA: Diagnosis not present

## 2021-11-30 DIAGNOSIS — R109 Unspecified abdominal pain: Secondary | ICD-10-CM | POA: Diagnosis not present

## 2021-11-30 NOTE — Telephone Encounter (Signed)
Patient called stating that last night she woke up two different times with what she called acid or indigestion pain and threw up both times and stated that there was half a cup of blood each time in the Vomit. Patient was instructed to not wait another second to get to the nearest ER and be evaluated. Patient was told it could be a bleeding ulcer and intestinal issue etc. Life threatening situation. Patient understood verbally and agreed she would get her things and go.

## 2021-12-04 ENCOUNTER — Other Ambulatory Visit: Payer: Self-pay

## 2021-12-04 MED ORDER — LEVOTHYROXINE SODIUM 137 MCG PO TABS: 137.0000 ug | ORAL_TABLET | Freq: Every day | ORAL | 2 refills | Status: DC

## 2021-12-06 DIAGNOSIS — R2681 Unsteadiness on feet: Secondary | ICD-10-CM | POA: Diagnosis not present

## 2021-12-06 DIAGNOSIS — R2689 Other abnormalities of gait and mobility: Secondary | ICD-10-CM | POA: Diagnosis not present

## 2021-12-10 ENCOUNTER — Other Ambulatory Visit: Payer: Self-pay

## 2021-12-10 ENCOUNTER — Telehealth: Payer: Self-pay

## 2021-12-10 MED ORDER — LEVOTHYROXINE SODIUM 137 MCG PO TABS: 137.0000 ug | ORAL_TABLET | Freq: Every day | ORAL | 2 refills | Status: DC

## 2021-12-10 NOTE — Telephone Encounter (Signed)
Patient calling as she realized that she was already on levothyroxine 137 mcg before lab work. She believes Endo did this increase previously. What does pt need to increase to?   Royce Macadamia, [place] 12/10/21 10:55 AM

## 2021-12-11 ENCOUNTER — Other Ambulatory Visit: Payer: Self-pay

## 2021-12-11 DIAGNOSIS — E10319 Type 1 diabetes mellitus with unspecified diabetic retinopathy without macular edema: Secondary | ICD-10-CM | POA: Diagnosis not present

## 2021-12-11 DIAGNOSIS — Z794 Long term (current) use of insulin: Secondary | ICD-10-CM | POA: Diagnosis not present

## 2021-12-11 DIAGNOSIS — E039 Hypothyroidism, unspecified: Secondary | ICD-10-CM

## 2021-12-11 DIAGNOSIS — E782 Mixed hyperlipidemia: Secondary | ICD-10-CM | POA: Diagnosis not present

## 2021-12-11 DIAGNOSIS — E1065 Type 1 diabetes mellitus with hyperglycemia: Secondary | ICD-10-CM | POA: Diagnosis not present

## 2021-12-11 MED ORDER — LEVOTHYROXINE SODIUM 150 MCG PO TABS: 150.0000 ug | ORAL_TABLET | Freq: Every day | ORAL | 2 refills | Status: DC

## 2021-12-11 NOTE — Telephone Encounter (Signed)
Dr. Tobie Poet advised increase dose to 150 mcg and recheck in 6 weeks.

## 2021-12-11 NOTE — Telephone Encounter (Signed)
According to [place] she has been on 137 mcg for a long time.  She previously was on 125 mcg that was last filled in May, 2022.

## 2021-12-11 NOTE — Telephone Encounter (Signed)
Call pharmacy and find out when dose change occurred. Kc

## 2021-12-13 DIAGNOSIS — R2689 Other abnormalities of gait and mobility: Secondary | ICD-10-CM | POA: Diagnosis not present

## 2021-12-13 DIAGNOSIS — R2681 Unsteadiness on feet: Secondary | ICD-10-CM | POA: Diagnosis not present

## 2021-12-21 DIAGNOSIS — R2689 Other abnormalities of gait and mobility: Secondary | ICD-10-CM | POA: Diagnosis not present

## 2021-12-21 DIAGNOSIS — R2681 Unsteadiness on feet: Secondary | ICD-10-CM | POA: Diagnosis not present

## 2021-12-22 ENCOUNTER — Other Ambulatory Visit: Payer: Self-pay | Admitting: Family Medicine

## 2021-12-22 DIAGNOSIS — R351 Nocturia: Secondary | ICD-10-CM

## 2021-12-23 ENCOUNTER — Other Ambulatory Visit: Payer: Self-pay | Admitting: Physician Assistant

## 2021-12-23 ENCOUNTER — Other Ambulatory Visit: Payer: Self-pay | Admitting: Family Medicine

## 2021-12-23 ENCOUNTER — Encounter: Payer: Self-pay | Admitting: Family Medicine

## 2021-12-23 DIAGNOSIS — R410 Disorientation, unspecified: Secondary | ICD-10-CM | POA: Insufficient documentation

## 2021-12-23 DIAGNOSIS — R270 Ataxia, unspecified: Secondary | ICD-10-CM

## 2021-12-23 DIAGNOSIS — E039 Hypothyroidism, unspecified: Secondary | ICD-10-CM | POA: Insufficient documentation

## 2021-12-23 DIAGNOSIS — R400 Somnolence: Secondary | ICD-10-CM

## 2021-12-23 HISTORY — DX: Hypothyroidism, unspecified: E03.9

## 2021-12-23 HISTORY — DX: Ataxia, unspecified: R27.0

## 2021-12-23 HISTORY — DX: Somnolence: R40.0

## 2021-12-23 NOTE — Assessment & Plan Note (Signed)
Refer to neurology

## 2021-12-23 NOTE — Assessment & Plan Note (Signed)
Order sleep study 

## 2021-12-23 NOTE — Assessment & Plan Note (Signed)
Check sleep study 

## 2021-12-23 NOTE — Assessment & Plan Note (Signed)
Check tsh, free T4.  Abnormal. Increase synthroid to 137 mcg once daily in am.

## 2021-12-27 ENCOUNTER — Telehealth: Payer: Self-pay

## 2021-12-27 NOTE — Telephone Encounter (Signed)
Attempted to reach patient about Prolia Benefits - no answer/no voicemail available.  Per [employer] - Ref# [id number] No PA required, 20% Coinsurance due

## 2021-12-28 DIAGNOSIS — R2689 Other abnormalities of gait and mobility: Secondary | ICD-10-CM | POA: Diagnosis not present

## 2021-12-28 DIAGNOSIS — R2681 Unsteadiness on feet: Secondary | ICD-10-CM | POA: Diagnosis not present

## 2021-12-30 ENCOUNTER — Other Ambulatory Visit: Payer: Self-pay | Admitting: Neurology

## 2021-12-31 ENCOUNTER — Telehealth: Payer: Self-pay

## 2021-12-31 NOTE — Telephone Encounter (Signed)
[name] was instructed to continue the Effexor and stop the Wellbutrin per Dr. Tobie Poet.  She has a follow-up appointment scheduled.

## 2021-12-31 NOTE — Telephone Encounter (Signed)
Patient calling as she believes she has been experiencing side effects of Wellbutrin. States she has been foggy and tired for a few months. Has been taking Wellbutrin for approximately one year per patient. She is questioning if she can change to lexapro. When trying to get more information patient would become agitated.   Patient has follow up appointment 2/20.   Royce Macadamia, [place] 12/31/21 9:15 AM

## 2022-01-04 DIAGNOSIS — R2681 Unsteadiness on feet: Secondary | ICD-10-CM | POA: Diagnosis not present

## 2022-01-04 DIAGNOSIS — R2689 Other abnormalities of gait and mobility: Secondary | ICD-10-CM | POA: Diagnosis not present

## 2022-01-07 ENCOUNTER — Ambulatory Visit (INDEPENDENT_AMBULATORY_CARE_PROVIDER_SITE_OTHER): Payer: HMO | Admitting: Family Medicine

## 2022-01-07 ENCOUNTER — Telehealth: Payer: Self-pay

## 2022-01-07 DIAGNOSIS — R400 Somnolence: Secondary | ICD-10-CM

## 2022-01-07 DIAGNOSIS — R410 Disorientation, unspecified: Secondary | ICD-10-CM

## 2022-01-07 NOTE — Progress Notes (Signed)
No show

## 2022-01-07 NOTE — Telephone Encounter (Signed)
Unsuccessful attempt to reach patient regarding Prolia - no answer/no VM available

## 2022-01-08 ENCOUNTER — Ambulatory Visit: Payer: HMO | Admitting: Neurology

## 2022-01-11 DIAGNOSIS — E1065 Type 1 diabetes mellitus with hyperglycemia: Secondary | ICD-10-CM | POA: Diagnosis not present

## 2022-01-11 DIAGNOSIS — E10319 Type 1 diabetes mellitus with unspecified diabetic retinopathy without macular edema: Secondary | ICD-10-CM | POA: Diagnosis not present

## 2022-01-14 ENCOUNTER — Other Ambulatory Visit: Payer: Self-pay | Admitting: Family Medicine

## 2022-01-14 ENCOUNTER — Telehealth: Payer: Self-pay

## 2022-01-14 ENCOUNTER — Other Ambulatory Visit: Payer: Self-pay

## 2022-01-14 MED ORDER — ESCITALOPRAM OXALATE 5 MG PO TABS: 5.0000 mg | ORAL_TABLET | Freq: Every day | ORAL | 0 refills | Status: DC

## 2022-01-14 NOTE — Telephone Encounter (Signed)
[name] called to report that she is battling with her depression.  She is wanting to discontinue her Effexor and begin Lexapro.  Dr. Tobie Poet advised that she decrease the Effexor XR 150 to every other day and begin Lexapro 5 mg daily for 1 week then increase to 10 mg daily.

## 2022-01-15 ENCOUNTER — Other Ambulatory Visit: Payer: HMO

## 2022-01-16 DIAGNOSIS — R2681 Unsteadiness on feet: Secondary | ICD-10-CM | POA: Diagnosis not present

## 2022-01-16 DIAGNOSIS — R2689 Other abnormalities of gait and mobility: Secondary | ICD-10-CM | POA: Diagnosis not present

## 2022-01-17 DIAGNOSIS — H4322 Crystalline deposits in vitreous body, left eye: Secondary | ICD-10-CM | POA: Diagnosis not present

## 2022-01-17 DIAGNOSIS — E103553 Type 1 diabetes mellitus with stable proliferative diabetic retinopathy, bilateral: Secondary | ICD-10-CM | POA: Diagnosis not present

## 2022-01-21 ENCOUNTER — Telehealth: Payer: Self-pay

## 2022-01-21 ENCOUNTER — Encounter: Payer: Self-pay | Admitting: Family Medicine

## 2022-01-21 ENCOUNTER — Telehealth (INDEPENDENT_AMBULATORY_CARE_PROVIDER_SITE_OTHER): Payer: HMO | Admitting: Family Medicine

## 2022-01-21 DIAGNOSIS — F329 Major depressive disorder, single episode, unspecified: Secondary | ICD-10-CM

## 2022-01-21 DIAGNOSIS — F331 Major depressive disorder, recurrent, moderate: Secondary | ICD-10-CM

## 2022-01-21 HISTORY — DX: Major depressive disorder, single episode, unspecified: F32.9

## 2022-01-21 NOTE — Telephone Encounter (Signed)
General/Other - medication management ? ?Patient calling as she is scared to withdrawal from effexor with the plan previously spoken about with Hoyle Sauer. She has not started the effexor every other day. She is now questioning since her depression has been worse (like stated with Hoyle Sauer) if she can return to the wellbutrin and effexor that she was previously taking. Could not remember dosing while taking both medications.  ? ?[name] 01/21/22 8:39 AM ? ? ?

## 2022-01-21 NOTE — Telephone Encounter (Signed)
After speaking with provider, virtual appointment made with patient today with provider. Patient is aware. She also denies suicidal thoughts.  ? ?[name] 01/21/22 10:13 AM ? ?

## 2022-01-21 NOTE — Progress Notes (Signed)
Virtual Visit via Video Note   This visit type was conducted due to patient's inability to come to office today.   Date:  01/21/2022   ID:  [name], DOB Nov 13, 1951, MRN [id number]  Patient Location: Other:  work Provider Location: Office/Clinic  PCP:  Rochel Brome, MD   Evaluation Performed:  acute  Chief Complaint:  depression worsening.   History of Present Illness:    [name] is a 71 y.o. female with worsening depression. Initially requested to change from effexor xr to lexapro. Was concerned about withdrawal, so called back and requesting to add wellbutrin xl. PHQ9 SCORE ONLY 01/21/2022 11/28/2021 11/28/2021  PHQ-9 Total Score 6 0 0   Past Medical History:  Diagnosis Date   Depression    Diabetes mellitus without complication (HCC)    GERD (gastroesophageal reflux disease)    Hypertension    Hypothyroidism    Stroke ([place])    Type 1 diabetes ([place])     Past Surgical History:  Procedure Laterality Date   APPENDECTOMY     APPLICATION OF WOUND VAC Left 08/03/2019   Procedure: Application Of Wound Vac;  Surgeon: Altamese Hico, MD;  Location: [place];  Service: Orthopedics;  Laterality: Left;   CESAREAN SECTION     ESOPHAGOGASTRODUODENOSCOPY (EGD) WITH PROPOFOL N/A 02/02/2016   Procedure: ESOPHAGOGASTRODUODENOSCOPY (EGD) WITH PROPOFOL;  Surgeon: Josefine Class, MD;  Location[facility] ENDOSCOPY;  Service: Endoscopy;  Laterality: N/A;   HAND SURGERY     JOINT REPLACEMENT Bilateral    Knee    ORIF ANKLE FRACTURE Left 08/03/2019   with wound vac applied    ORIF ANKLE FRACTURE Left 08/03/2019   Procedure: OPEN REDUCTION INTERNAL FIXATION (ORIF) ANKLE FRACTURE;  Surgeon: Altamese Cool, MD;  Location: [place];  Service: Orthopedics;  Laterality: Left;   TIBIALIS TENDON TRANSFER / REPAIR      Family History  Problem Relation Age of Onset   Cancer Mother        Colon cancer    Dementia Father    Neuropathy Father     Social History   Socioeconomic History    Marital status: Single    Spouse name: Not on file   Number of children: Not on file   Years of education: Not on file   Highest education level: Not on file  Occupational History   Not on file  Tobacco Use   Smoking status: Never   Smokeless tobacco: Never  Vaping Use   Vaping Use: Never used  Substance and Sexual Activity   Alcohol use: Yes    Alcohol/week: 1.0 standard drink    Types: 1 Glasses of wine per week    Comment: everyday   Drug use: Never   Sexual activity: Not Currently  Other Topics Concern   Not on file  Social History Narrative   Right hand   Lives alone   Social Determinants of Health   Financial Resource Strain: Not on file  Food Insecurity: Not on file  Transportation Needs: Not on file  Physical Activity: Not on file  Stress: Not on file  Social Connections: Not on file  Intimate Partner Violence: Not on file    Outpatient Medications Prior to Visit  Medication Sig Dispense Refill   acyclovir (ZOVIRAX) 800 MG tablet TAKE 1 TABLET (800 MG) BY MOUTH 4 TIMES PER DAY FOR 3-7 DAYS AS NEEDED 30 tablet 2   amLODipine (NORVASC) 10 MG tablet TAKE 1 TABLET BY MOUTH EVERY DAY 90 tablet 3  aspirin EC 81 MG tablet Take 81 mg by mouth 2 (two) times a week.      buPROPion (WELLBUTRIN XL) 150 MG 24 hr tablet TAKE 1 TABLET BY MOUTH EVERY DAY 90 tablet 3   clopidogrel (PLAVIX) 75 MG tablet Take 1 tablet (75 mg total) by mouth daily. 14 tablet 0   escitalopram (LEXAPRO) 5 MG tablet Take 1 tablet (5 mg total) by mouth daily. Take 1 daily for 1 week then increase to 2 daily. 49 tablet 0   famotidine (PEPCID) 20 MG tablet Take 20 mg by mouth 2 (two) times daily.     gabapentin (NEURONTIN) 100 MG capsule TAKE 2 CAPSULES BY MOUTH AT BEDTIME. 60 capsule 0   GABAPENTIN PO Take 2 capsules by mouth daily.     insulin aspart (NOVOLOG) 100 UNIT/ML injection Inject 2-3 Units into the skin 3 (three) times daily before meals. Inject 6 units every am, 6 units at noon and 3  units at bedtime.      insulin glargine (LANTUS) 100 UNIT/ML injection Inject 0.15 mLs (15 Units total) into the skin every evening. Reported on 02/02/2016 10 mL 1   levothyroxine (SYNTHROID) 150 MCG tablet TAKE 1 TABLET BY MOUTH DAILY BEFORE BREAKFAST. 90 tablet 0   LORazepam (ATIVAN) 0.5 MG tablet TAKE 1 TABLET BY MOUTH EVERY DAY AS NEEDED FOR ANXIETY 30 tablet 3   losartan (COZAAR) 50 MG tablet TAKE 1 TABLET BY MOUTH EVERY DAY 90 tablet 1   metoCLOPramide (REGLAN) 10 MG tablet Take 1 tablet (10 mg total) by mouth daily. 30 tablet 0   Omega-3 Fatty Acids (FISH OIL PO) Take 1 capsule by mouth daily.     omeprazole (PRILOSEC) 40 MG capsule TAKE 1 CAPSULE BY MOUTH EVERY DAY 90 capsule 3   ondansetron (ZOFRAN) 4 MG tablet Take 1 tablet (4 mg total) by mouth every 8 (eight) hours as needed for nausea or vomiting. 20 tablet 0   ONETOUCH ULTRA test strip SMARTSIG:6 Via Meter 6 Times Daily     Rimegepant Sulfate (NURTEC) 75 MG TBDP Take 75 mg by mouth once as needed for up to 1 dose. 30 tablet 0   rosuvastatin (CRESTOR) 40 MG tablet Take 40 mg by mouth daily.     tolterodine (DETROL LA) 2 MG 24 hr capsule TAKE 1 CAPSULE BY MOUTH AT BEDTIME. 90 capsule 0   venlafaxine XR (EFFEXOR-XR) 150 MG 24 hr capsule TAKE 1 CAPSULE BY MOUTH EVERY DAY 90 capsule 0   Vitamin D, Ergocalciferol, (DRISDOL) 1.25 MG (50000 UNIT) CAPS capsule TAKE 1 CAPSULE BY MOUTH EACH WEEK 12 capsule 3   No facility-administered medications prior to visit.    Allergies:   Lisinopril   Social History   Tobacco Use   Smoking status: Never   Smokeless tobacco: Never  Vaping Use   Vaping Use: Never used  Substance Use Topics   Alcohol use: Yes    Alcohol/week: 1.0 standard drink    Types: 1 Glasses of wine per week    Comment: everyday   Drug use: Never     Review of Systems  Constitutional:  Negative for chills, diaphoresis, fever and malaise/fatigue.  HENT:  Negative for congestion, ear pain and sore throat.    Respiratory:  Negative for shortness of breath.   Cardiovascular:  Negative for chest pain.  Psychiatric/Behavioral:  Positive for depression. Negative for suicidal ideas. The patient is not nervous/anxious.     Labs/Other Tests and Data Reviewed:    Recent  Labs: 04/17/2021: ALT 16; Hemoglobin 12.6; Platelets 348 11/02/2021: BUN 16; Creatinine, Ser 0.85; Potassium 4.2; Sodium 138 11/28/2021: TSH 16.300   Recent Lipid Panel Lab Results  Component Value Date/Time   CHOL 245 (H) 04/17/2021 08:06 AM   TRIG 88 04/17/2021 08:06 AM   HDL 119 04/17/2021 08:06 AM   CHOLHDL 2.1 04/17/2021 08:06 AM   LDLCALC 111 (H) 04/17/2021 08:06 AM    Wt Readings from Last 3 Encounters:  11/28/21 152 lb (68.9 kg)  10/25/21 153 lb (69.4 kg)  06/05/21 150 lb (68 kg)     Objective:    Vital Signs:  There were no vitals taken for this visit.   Physical Exam   ASSESSMENT & PLAN:    Problem List Items Addressed This Visit   None .  No orders of the defined types were placed in this encounter.    No orders of the defined types were placed in this encounter.   COVID-19 Education: The signs and symptoms of COVID-19 were discussed with the patient and how to seek care for testing (follow up with PCP or arrange E-visit). The importance of social distancing was discussed today.   I spent < time > minutes dedicated to the care of this patient on the date of this encounter to include face-to-face time with the patient, as well as: ***  Follow Up:  {F/U Format:[phone]} {follow up:15908}  Signed, Rochel Brome, MD  01/21/2022 12:58 PM    [place]

## 2022-01-25 DIAGNOSIS — R2689 Other abnormalities of gait and mobility: Secondary | ICD-10-CM | POA: Diagnosis not present

## 2022-01-25 DIAGNOSIS — R2681 Unsteadiness on feet: Secondary | ICD-10-CM | POA: Diagnosis not present

## 2022-01-31 ENCOUNTER — Other Ambulatory Visit: Payer: Self-pay

## 2022-01-31 ENCOUNTER — Ambulatory Visit (INDEPENDENT_AMBULATORY_CARE_PROVIDER_SITE_OTHER): Payer: HMO | Admitting: Family Medicine

## 2022-01-31 VITALS — BP 130/70 | HR 84 | Temp 97.2°F | Resp 18 | Ht 63.5 in | Wt 155.0 lb

## 2022-01-31 DIAGNOSIS — I152 Hypertension secondary to endocrine disorders: Secondary | ICD-10-CM | POA: Diagnosis not present

## 2022-01-31 DIAGNOSIS — E1065 Type 1 diabetes mellitus with hyperglycemia: Secondary | ICD-10-CM | POA: Diagnosis not present

## 2022-01-31 DIAGNOSIS — F331 Major depressive disorder, recurrent, moderate: Secondary | ICD-10-CM

## 2022-01-31 DIAGNOSIS — E039 Hypothyroidism, unspecified: Secondary | ICD-10-CM

## 2022-01-31 DIAGNOSIS — E1159 Type 2 diabetes mellitus with other circulatory complications: Secondary | ICD-10-CM

## 2022-01-31 DIAGNOSIS — E782 Mixed hyperlipidemia: Secondary | ICD-10-CM

## 2022-01-31 MED ORDER — ROSUVASTATIN CALCIUM 40 MG PO TABS: 40.0000 mg | ORAL_TABLET | Freq: Every day | ORAL | 1 refills | Status: DC

## 2022-01-31 NOTE — Progress Notes (Signed)
? ?Subjective:  ?Patient ID: [name], female    DOB: September 03, 1951  Age: 71 y.o. MRN: [id number] ? ?Chief Complaint  ?Patient presents with  ? Depression  ? ?HPI ?GAD: Patient taking effexor xr 150 mg once in am. Patient would like to start wellbutrin xl 150 mg once in am. She has had increased situational anxiety with moving. Not depressed anymore.  ? ?  01/31/2022  ?  3:48 PM 01/21/2022  ? 12:58 PM 11/28/2021  ?  1:42 PM  ?PHQ9 SCORE ONLY  ?PHQ-9 Total Score 3 6 0  ? ? ? ? ?Current Outpatient Medications on File Prior to Visit  ?Medication Sig Dispense Refill  ? aspirin EC 81 MG tablet Take 81 mg by mouth 2 (two) times a week.     ? calcium carbonate (OSCAL) 1500 (600 Ca) MG TABS tablet Take by mouth 2 (two) times daily with a meal.    ? clopidogrel (PLAVIX) 75 MG tablet Take 1 tablet (75 mg total) by mouth daily. (Patient not taking: Reported on 02/13/2022) 14 tablet 0  ? ferrous sulfate 325 (65 FE) MG tablet Take 325 mg by mouth daily with breakfast.    ? insulin aspart (NOVOLOG) 100 UNIT/ML injection Inject 2-3 Units into the skin 3 (three) times daily before meals. Inject 6 units every am, 6 units at noon and 3 units at bedtime.     ? insulin glargine (LANTUS) 100 UNIT/ML injection Inject 0.15 mLs (15 Units total) into the skin every evening. Reported on 02/02/2016 (Patient taking differently: Inject 14 Units into the skin every evening. Reported on 02/02/2016) 10 mL 1  ? levothyroxine (SYNTHROID) 150 MCG tablet TAKE 1 TABLET BY MOUTH DAILY BEFORE BREAKFAST. 90 tablet 0  ? LORazepam (ATIVAN) 0.5 MG tablet TAKE 1 TABLET BY MOUTH EVERY DAY AS NEEDED FOR ANXIETY 30 tablet 3  ? losartan (COZAAR) 50 MG tablet TAKE 1 TABLET BY MOUTH EVERY DAY 90 tablet 1  ? metoCLOPramide (REGLAN) 10 MG tablet Take 1 tablet (10 mg total) by mouth daily. 30 tablet 0  ? Omega-3 Fatty Acids (FISH OIL PO) Take 1 capsule by mouth daily.    ? venlafaxine XR (EFFEXOR-XR) 150 MG 24 hr capsule TAKE 1 CAPSULE BY MOUTH EVERY DAY 90 capsule 0  ?  Vitamin D, Ergocalciferol, (DRISDOL) 1.25 MG (50000 UNIT) CAPS capsule TAKE 1 CAPSULE BY MOUTH EACH WEEK 12 capsule 3  ? famotidine (PEPCID) 20 MG tablet Take 20 mg by mouth 2 (two) times daily. (Patient not taking: Reported on 01/31/2022)    ? ?No current facility-administered medications on file prior to visit.  ? ?Past Medical History:  ?Diagnosis Date  ? Depression   ? Diabetes mellitus without complication ([place])   ? GERD (gastroesophageal reflux disease)   ? Hypertension   ? Hypothyroidism   ? Stroke [facility])   ? Type 1 diabetes ([place])   ? ?Past Surgical History:  ?Procedure Laterality Date  ? APPENDECTOMY    ? APPLICATION OF WOUND VAC Left 08/03/2019  ? Procedure: Application Of Wound Vac;  Surgeon: Altamese [place], MD;  Location: [place];  Service: Orthopedics;  Laterality: Left;  ? CESAREAN SECTION    ? ESOPHAGOGASTRODUODENOSCOPY (EGD) WITH PROPOFOL N/A 02/02/2016  ? Procedure: ESOPHAGOGASTRODUODENOSCOPY (EGD) WITH PROPOFOL;  Surgeon: Josefine Class, MD;  Location[facility] ENDOSCOPY;  Service: Endoscopy;  Laterality: N/A;  ? HAND SURGERY    ? JOINT REPLACEMENT Bilateral   ? Knee   ? ORIF ANKLE FRACTURE Left 08/03/2019  ?  with wound vac applied   ? ORIF ANKLE FRACTURE Left 08/03/2019  ? Procedure: OPEN REDUCTION INTERNAL FIXATION (ORIF) ANKLE FRACTURE;  Surgeon: Altamese Higbee, MD;  Location: [place];  Service: Orthopedics;  Laterality: Left;  ? TIBIALIS TENDON TRANSFER / REPAIR    ?  ?Family History  ?Problem Relation Age of Onset  ? Cancer Mother   ?     Colon cancer   ? Dementia Father   ? Neuropathy Father   ? ?Social History  ? ?Socioeconomic History  ? Marital status: Single  ?  Spouse name: Not on file  ? Number of children: Not on file  ? Years of education: Not on file  ? Highest education level: Not on file  ?Occupational History  ? Not on file  ?Tobacco Use  ? Smoking status: Never  ? Smokeless tobacco: Never  ?Vaping Use  ? Vaping Use: Never used  ?Substance and Sexual Activity  ? Alcohol use: Yes  ?   Alcohol/week: 1.0 standard drink  ?  Types: 1 Glasses of wine per week  ?  Comment: everyday  ? Drug use: Never  ? Sexual activity: Not Currently  ?Other Topics Concern  ? Not on file  ?Social History Narrative  ? Right hand  ? Lives alone  ? ?Social Determinants of Health  ? ?Financial Resource Strain: Not on file  ?Food Insecurity: Not on file  ?Transportation Needs: Not on file  ?Physical Activity: Not on file  ?Stress: Not on file  ?Social Connections: Not on file  ? ? ?Review of Systems  ?Constitutional:  Negative for chills, fatigue and fever.  ?HENT:  Negative for congestion, rhinorrhea and sore throat.   ?Respiratory:  Negative for cough and shortness of breath.   ?Cardiovascular:  Negative for chest pain.  ?Gastrointestinal:  Negative for abdominal pain, constipation, diarrhea, nausea and vomiting.  ?Genitourinary:  Negative for dysuria and urgency.  ?Musculoskeletal:  Negative for back pain and myalgias.  ?Neurological:  Negative for dizziness, weakness, light-headedness and headaches.  ?Psychiatric/Behavioral:  Positive for dysphoric mood and sleep disturbance. The patient is not nervous/anxious.   ? ? ?Objective:  ?BP 130/70   Pulse 84   Temp (!) 97.2 ?F (36.2 ?C)   Resp 18   Ht 5' 3.5" (1.613 m)   Wt 155 lb (70.3 kg)   BMI 27.03 kg/m?  ? ? ?  02/13/2022  ?  2:08 PM 01/31/2022  ?  3:37 PM 11/28/2021  ?  1:39 PM  ?BP/Weight  ?Systolic BP 245 809 983  ?Diastolic BP 74 70 80  ?Wt. (Lbs) 155 155 152  ?BMI 27.46 kg/m2 27.03 kg/m2 26.93 kg/m2  ? ? ?Physical Exam ?Vitals reviewed.  ?Constitutional:   ?   Appearance: Normal appearance. She is normal weight.  ?Neck:  ?   Vascular: No carotid bruit.  ?Cardiovascular:  ?   Rate and Rhythm: Normal rate and regular rhythm.  ?   Heart sounds: Normal heart sounds.  ?Pulmonary:  ?   Effort: Pulmonary effort is normal. No respiratory distress.  ?   Breath sounds: Normal breath sounds.  ?Abdominal:  ?   Palpations: Abdomen is soft.  ?Neurological:  ?   Mental Status:  She is alert and oriented to person, place, and time.  ?Psychiatric:     ?   Mood and Affect: Mood normal.     ?   Behavior: Behavior normal.  ? ? ?Diabetic Foot Exam - Simple   ?No data filed ?  ?  ? ?  Lab Results  ?Component Value Date  ? WBC 4.6 02/01/2022  ? HGB 11.8 02/01/2022  ? HCT 36.0 02/01/2022  ? PLT 396 02/01/2022  ? GLUCOSE 67 (L) 02/01/2022  ? CHOL 229 (H) 02/01/2022  ? TRIG 62 02/01/2022  ? HDL 135 02/01/2022  ? [place] 83 02/01/2022  ? ALT 14 02/01/2022  ? AST 18 02/01/2022  ? NA 137 02/01/2022  ? K 4.2 02/01/2022  ? CL 98 02/01/2022  ? CREATININE 0.88 02/01/2022  ? BUN 13 02/01/2022  ? CO2 25 02/01/2022  ? TSH 1.310 02/01/2022  ? INR 0.9 08/03/2019  ? HGBA1C 7.6 (H) 02/01/2022  ? ? ? ? ?Assessment & Plan:  ? ?Problem List Items Addressed This Visit   ? ?  ? Cardiovascular and Mediastinum  ? Hypertension associated with diabetes ([place])  ?  The current medical regimen is effective;  continue present plan and medications. ?Check labs.  ?  ?  ? Relevant Medications  ? rosuvastatin (CRESTOR) 40 MG tablet  ? Other Relevant Orders  ? CBC with Differential/Platelet (Completed)  ? Comprehensive metabolic panel (Completed)  ?  ? Endocrine  ? Type 1 diabetes mellitus with hyperglycemia (HCC)  ?  Management by endocrinology.  ?  ?  ? Relevant Medications  ? rosuvastatin (CRESTOR) 40 MG tablet  ? Other Relevant Orders  ? Hemoglobin A1c (Completed)  ? Acquired hypothyroidism  ?  Check tsh ?  ?  ? Relevant Orders  ? T4, free (Completed)  ? TSH (Completed)  ?  ? Other  ? Depression, major, recurrent, moderate ([place])  ?  Start wellbutrin xl 150 mg qam. ?  ?  ? Mixed hyperlipidemia - Primary  ?  Well controlled.  ?No changes to medicines.  ?Continue to work on eating a healthy diet and exercise.  ?Labs drawn today.  ? ?  ?  ? Relevant Medications  ? rosuvastatin (CRESTOR) 40 MG tablet  ? Other Relevant Orders  ? Lipid panel (Completed)  ?. ? ?Meds ordered this encounter  ?Medications  ? rosuvastatin (CRESTOR) 40 MG  tablet  ?  Sig: Take 1 tablet (40 mg total) by mouth daily.  ?  Dispense:  90 tablet  ?  Refill:  1  ? ? ?Orders Placed This Encounter  ?Procedures  ? CBC with Differential/Platelet  ? Comprehensive metabolic panel  ?

## 2022-02-01 ENCOUNTER — Other Ambulatory Visit: Payer: HMO

## 2022-02-01 DIAGNOSIS — E039 Hypothyroidism, unspecified: Secondary | ICD-10-CM | POA: Diagnosis not present

## 2022-02-01 DIAGNOSIS — E782 Mixed hyperlipidemia: Secondary | ICD-10-CM

## 2022-02-01 DIAGNOSIS — E1065 Type 1 diabetes mellitus with hyperglycemia: Secondary | ICD-10-CM | POA: Diagnosis not present

## 2022-02-01 DIAGNOSIS — R2689 Other abnormalities of gait and mobility: Secondary | ICD-10-CM | POA: Diagnosis not present

## 2022-02-01 DIAGNOSIS — I10 Essential (primary) hypertension: Secondary | ICD-10-CM

## 2022-02-01 DIAGNOSIS — R2681 Unsteadiness on feet: Secondary | ICD-10-CM | POA: Diagnosis not present

## 2022-02-02 ENCOUNTER — Other Ambulatory Visit: Payer: Self-pay | Admitting: Family Medicine

## 2022-02-02 LAB — CBC WITH DIFFERENTIAL/PLATELET
Basophils Absolute: 0.1 10*3/uL (ref 0.0–0.2)
Basos: 2 %
EOS (ABSOLUTE): 0.3 10*3/uL (ref 0.0–0.4)
Eos: 7 %
Hematocrit: 36 % (ref 34.0–46.6)
Hemoglobin: 11.8 g/dL (ref 11.1–15.9)
Immature Grans (Abs): 0 10*3/uL (ref 0.0–0.1)
Immature Granulocytes: 0 %
Lymphocytes Absolute: 0.8 10*3/uL (ref 0.7–3.1)
Lymphs: 18 %
MCH: 29.3 pg (ref 26.6–33.0)
MCHC: 32.8 g/dL (ref 31.5–35.7)
MCV: 89 fL (ref 79–97)
Monocytes Absolute: 0.4 10*3/uL (ref 0.1–0.9)
Monocytes: 10 %
Neutrophils Absolute: 2.9 10*3/uL (ref 1.4–7.0)
Neutrophils: 63 %
Platelets: 396 10*3/uL (ref 150–450)
RBC: 4.03 x10E6/uL (ref 3.77–5.28)
RDW: 13.4 % (ref 11.7–15.4)
WBC: 4.6 10*3/uL (ref 3.4–10.8)

## 2022-02-02 LAB — HEMOGLOBIN A1C
Est. average glucose Bld gHb Est-mCnc: 171 mg/dL
Hgb A1c MFr Bld: 7.6 % — ABNORMAL HIGH (ref 4.8–5.6)

## 2022-02-02 LAB — COMPREHENSIVE METABOLIC PANEL
ALT: 14 IU/L (ref 0–32)
AST: 18 IU/L (ref 0–40)
Albumin/Globulin Ratio: 1.8 (ref 1.2–2.2)
Albumin: 4.4 g/dL (ref 3.7–4.7)
Alkaline Phosphatase: 138 IU/L — ABNORMAL HIGH (ref 44–121)
BUN/Creatinine Ratio: 15 (ref 12–28)
BUN: 13 mg/dL (ref 8–27)
Bilirubin Total: <0.2 mg/dL (ref 0.0–1.2)
CO2: 25 mmol/L (ref 20–29)
Calcium: 9.8 mg/dL (ref 8.7–10.3)
Chloride: 98 mmol/L (ref 96–106)
Creatinine, Ser: 0.88 mg/dL (ref 0.57–1.00)
Globulin, Total: 2.4 g/dL (ref 1.5–4.5)
Glucose: 67 mg/dL — ABNORMAL LOW (ref 70–99)
Potassium: 4.2 mmol/L (ref 3.5–5.2)
Sodium: 137 mmol/L (ref 134–144)
Total Protein: 6.8 g/dL (ref 6.0–8.5)
eGFR: 70 mL/min/{1.73_m2} (ref >59)

## 2022-02-02 LAB — LIPID PANEL
Chol/HDL Ratio: 1.7 ratio (ref 0.0–4.4)
Cholesterol, Total: 229 mg/dL — ABNORMAL HIGH (ref 100–199)
HDL: 135 mg/dL (ref >39)
LDL Chol Calc (NIH): 83 mg/dL (ref 0–99)
Triglycerides: 62 mg/dL (ref 0–149)
VLDL Cholesterol Cal: 11 mg/dL (ref 5–40)

## 2022-02-02 LAB — T4, FREE: Free T4: 1.19 ng/dL (ref 0.82–1.77)

## 2022-02-02 LAB — CARDIOVASCULAR RISK ASSESSMENT

## 2022-02-02 LAB — TSH: TSH: 1.31 u[IU]/mL (ref 0.450–4.500)

## 2022-02-08 DIAGNOSIS — R2681 Unsteadiness on feet: Secondary | ICD-10-CM | POA: Diagnosis not present

## 2022-02-08 DIAGNOSIS — E10319 Type 1 diabetes mellitus with unspecified diabetic retinopathy without macular edema: Secondary | ICD-10-CM | POA: Diagnosis not present

## 2022-02-08 DIAGNOSIS — E1065 Type 1 diabetes mellitus with hyperglycemia: Secondary | ICD-10-CM | POA: Diagnosis not present

## 2022-02-08 DIAGNOSIS — R2689 Other abnormalities of gait and mobility: Secondary | ICD-10-CM | POA: Diagnosis not present

## 2022-02-10 ENCOUNTER — Other Ambulatory Visit: Payer: Self-pay | Admitting: Family Medicine

## 2022-02-10 DIAGNOSIS — F331 Major depressive disorder, recurrent, moderate: Secondary | ICD-10-CM

## 2022-02-11 ENCOUNTER — Telehealth: Payer: Self-pay

## 2022-02-11 ENCOUNTER — Other Ambulatory Visit: Payer: Self-pay

## 2022-02-11 ENCOUNTER — Encounter: Payer: Self-pay | Admitting: Family Medicine

## 2022-02-11 ENCOUNTER — Other Ambulatory Visit: Payer: Self-pay | Admitting: Family Medicine

## 2022-02-11 MED ORDER — ACYCLOVIR 800 MG PO TABS: ORAL_TABLET | ORAL | 2 refills | Status: DC

## 2022-02-11 MED ORDER — PANTOPRAZOLE SODIUM 40 MG PO TBEC: 40.0000 mg | DELAYED_RELEASE_TABLET | Freq: Every day | ORAL | 1 refills | Status: DC

## 2022-02-11 NOTE — Telephone Encounter (Signed)
Patient is requesting recommendations for omeprazole. Patient had a recent move and has misplaced her bottle. She states insurance will not let her fill this. Requests alternative due to not being able to get this filled.  ? ?Insurance will not allow fill due to last fill in February.  ? ?[name] 02/11/22 8:51 AM ? ?

## 2022-02-11 NOTE — Telephone Encounter (Signed)
Unable to reach patient.  ? ?[name] 02/11/22 10:59 AM ? ?

## 2022-02-11 NOTE — Telephone Encounter (Signed)
Patient has current outbreak.  ? ?[name] 02/11/22 8:44 AM ? ?

## 2022-02-12 NOTE — Telephone Encounter (Signed)
Patient [name].  ? ?She did also question if a new sleep study could be ordered. She stated the last sleep study was not done properly "the cannulas did not stay in." She felt she was supposed to have another one done. Last order placed 09/27/2021. She is requesting this now. Please advise.  ? ?Harrell Lark 02/12/22 3:50 PM ? ?

## 2022-02-13 ENCOUNTER — Encounter: Payer: Self-pay | Admitting: Neurology

## 2022-02-13 ENCOUNTER — Ambulatory Visit: Payer: HMO | Admitting: Neurology

## 2022-02-13 VITALS — BP 129/74 | HR 68 | Resp 18 | Ht 63.0 in | Wt 155.0 lb

## 2022-02-13 DIAGNOSIS — R410 Disorientation, unspecified: Secondary | ICD-10-CM | POA: Diagnosis not present

## 2022-02-13 DIAGNOSIS — R413 Other amnesia: Secondary | ICD-10-CM

## 2022-02-13 NOTE — Patient Instructions (Signed)
Good to see you doing well. ? ?Schedule Neurocognitive evaluation ? ?2. Follow-up in 8 months, call for any changes ? ? ?You have been referred for a neuropsychological evaluation (i.e., evaluation of memory and thinking abilities). Please bring someone with you to this appointment if possible, as it is helpful for the doctor to hear from both you and another adult who knows you well. Please bring eyeglasses and hearing aids if you wear them.  ?  ?The evaluation will take approximately 3 hours and has two parts: ?  ?The first part is a clinical interview with the neuropsychologist (Dr. Melvyn Novas or Dr. Nicole Kindred). During the interview, the neuropsychologist will speak with you and the individual you brought to the appointment.  ?  ?The second part of the evaluation is testing with the doctor's technician Hinton Dyer or Maudie Mercury). During the testing, the technician will ask you to remember different types of material, solve problems, and answer some questionnaires. Your family member will not be present for this portion of the evaluation. ?  ?Please note: We must reserve several hours of the neuropsychologist's time and the psychometrician's time for your evaluation appointment. As such, there is a No-Show fee of $100. If you are unable to attend any of your appointments, please contact our office as soon as possible to reschedule. ? ?

## 2022-02-13 NOTE — Progress Notes (Addendum)
? ?NEUROLOGY FOLLOW UP OFFICE NOTE ? ?[name] ?[id number] ?March 16, 1951 ? ?HISTORY OF PRESENT ILLNESS: ?I had the pleasure of seeing [name] in follow-up in the neurology clinic on 02/13/2022.  The patient was last seen a year ago for headaches. She was initially seen in 2017 for memory loss. She now presents for evaluation of new symptoms of confusion and ataxia leading to ER visit at [facility] in 11/26/2021 for drowsiness, poor mental status, and staggering gait for 2 days. Records were reviewed. Her son had noticed similar symptoms for 2 weeks intermittently prior to this. In the ER, she became more drowsy with glucose of 78. Her son reported cognitive decline for several years, forgetting she started the laundry, or thinking she put things into the fridge when she put in at the frong door. She reportedly had an unremarkable brain MRI, report unavailable for review. Final diagnosis was hypoglycemia. She states that she feels better, glucose levels are better, ranging from 80-200. She feels fine. Her son moved in with her, she feels her memory has lapses such as taking a longer time bringing a name up, but she denies any difficulties with complex tasks. She denies getting lost driving. She denies any difficulties managing her medications or finances. 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 She states her son is "worried about everything, watching everything I say or do, he is terrified I will get Alzheimer's." She denies any further headaches and stopped the gabapentin. She broke her heel in September which affected her stance, but she states she goes to physical therapy once a week to improve her balance and walks regularly. She is on aspirin '81mg'$  daily for secondary stroke prevention, BP today 129/74. ? ? 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 She started noticing changes last school year, she was told by co-workers that she was asking the same questions. Her son has anxiety and has been concerned because she comes down to the kitchen and does not see him to her side. He thinks there is something wrong. She has occasional word-finding difficulties. She denies getting lost driving, no missed bills or medications. She denies any difficulties with ADLs. She drinks 1-2 glasses of wine every night for the past 8-10 years. She denies any head injuries. She reports they were told her father has dementia, but they are unsure if true. Her sister had strokes with some cognitive changes. She has a history of anxiety and depression, reporting rare anxiety. She denies any worsening stress, but that she had to learn a lot of new things for the new school year. She denies any headaches, dizziness, diplopia, dysarthria, dysphagia, neck/back pain, focal numbness/tingling/weakness, bowel/bladder dysfunction. No anosmia, tremors, no falls.  ? ?TSH and B12 done at PCP office were normal. She had an MRI brain with and without contrast done 04/05/16 which was reported as unremarkable, mild chronic small vessel disease and atrophy, no acute changes. Images unavailable for review. ? ?Update 01/22/2021: She presents for an earlier visit today after recent hospitalization on 01/10/21 for stroke. She presented to [facility] for generalized malaise. She had a colonoscopy 3 weeks prior and while doing the prep, she began having abdominal cramping, headaches, generalized aches, which did not resolve after the procedure. She feels lightheaded when standing. She was found to have orthostatic hypotension and Lisinopril was stopped. CT head and CTA angiogram  no acute changes, there was some narrowing of the V4 segment of the right vertebral artery. MRI brain showed a subacute small vessel infarct in the right dorsal pons. Echocardiogram no significant abnormalities. She was  started on aspirin and Plavix, then after 2 weeks stop Plavix. LDL was 113, rosuvastatin dose increased to '20mg'$  daily. HbA1c was 9. She was evaluated by PT and OT with no deficits seen on exam. She was also noted to report daily alcohol use. She had a migraine on hospital admission treated with migraine cocktail. ? ? ?PAST MEDICAL HISTORY: ?Past Medical History:  ?Diagnosis Date  ? Depression   ? Diabetes mellitus without complication ([place])   ? GERD (gastroesophageal reflux disease)   ? Hypertension   ? Hypothyroidism   ? Stroke [facility])   ? Type 1 diabetes ([place])   ? ? ?MEDICATIONS: ?Current Outpatient Medications on File Prior to Visit  ?Medication Sig Dispense Refill  ? acyclovir (ZOVIRAX) 800 MG tablet TAKE 1 TABLET (800 MG) BY MOUTH 4 TIMES PER DAY FOR 3-7 DAYS AS NEEDED 30 tablet 2  ? amLODipine (NORVASC) 10 MG tablet TAKE 1 TABLET BY MOUTH EVERY DAY 90 tablet 3  ? aspirin EC 81 MG tablet Take 81 mg by mouth 2 (two) times a week.     ? buPROPion (WELLBUTRIN XL) 150 MG 24 hr tablet TAKE 1 TABLET BY MOUTH EVERY DAY 90 tablet 3  ? calcium carbonate (OSCAL) 1500 (600 Ca) MG TABS tablet Take by mouth 2 (two) times daily with a meal.    ? clopidogrel (PLAVIX) 75 MG tablet Take 1 tablet (75 mg total) by mouth daily. 14 tablet 0  ? famotidine (PEPCID) 20 MG tablet Take 20 mg by mouth 2 (two) times daily. (Patient not taking: Reported on 01/31/2022)    ? ferrous sulfate 325 (65 FE) MG tablet Take 325 mg by mouth daily with breakfast.    ? insulin aspart (NOVOLOG) 100 UNIT/ML injection Inject 2-3 Units into the skin 3 (three) times daily before meals. Inject 6 units every am, 6 units at noon and 3 units at bedtime.     ? insulin glargine (LANTUS) 100 UNIT/ML injection Inject 0.15 mLs (15 Units total) into the skin every evening. Reported on 02/02/2016 (Patient taking differently: Inject 14 Units into the skin every evening. Reported on 02/02/2016) 10 mL 1  ? levothyroxine (SYNTHROID) 150 MCG tablet TAKE 1 TABLET BY MOUTH DAILY  BEFORE BREAKFAST. 90 tablet 0  ? LORazepam (ATIVAN) 0.5 MG tablet TAKE 1 TABLET BY MOUTH EVERY DAY AS NEEDED FOR ANXIETY 30 tablet 3  ? losartan (COZAAR) 50 MG tablet TAKE 1 TABLET BY MOUTH EVERY DAY 90 tablet 1  ? metoCLOPramide (REGLAN) 10 MG tablet Take 1 tablet (10 mg total) by mouth daily. 30 tablet 0  ? Omega-3 Fatty Acids (FISH OIL PO) Take 1 capsule by mouth daily.    ? pantoprazole (PROTONIX) 40 MG tablet Take 1 tablet (40 mg total) by mouth daily. 90 tablet 1  ? rosuvastatin (CRESTOR) 40 MG tablet Take 1 tablet (40 mg total) by mouth daily. 90 tablet 1  ? venlafaxine XR (EFFEXOR-XR) 150 MG 24 hr capsule TAKE 1 CAPSULE BY MOUTH EVERY DAY 90 capsule 0  ? Vitamin D, Ergocalciferol, (DRISDOL) 1.25 MG (50000 UNIT) CAPS capsule TAKE 1 CAPSULE BY MOUTH EACH WEEK 12 capsule 3  ? ?No current facility-administered medications on file prior to visit.  ? ? ?ALLERGIES: ?Allergies  ?Allergen Reactions  ? Lisinopril Cough  ? ? ?  FAMILY HISTORY: ?Family History  ?Problem Relation Age of Onset  ? Cancer Mother   ?     Colon cancer   ? Dementia Father   ? Neuropathy Father   ? ? ?SOCIAL HISTORY: ?Social History  ? ?Socioeconomic History  ? Marital status: Single  ?  Spouse name: Not on file  ? Number of children: Not on file  ? Years of education: Not on file  ? Highest education level: Not on file  ?Occupational History  ? Not on file  ?Tobacco Use  ? Smoking status: Never  ? Smokeless tobacco: Never  ?Vaping Use  ? Vaping Use: Never used  ?Substance and Sexual Activity  ? Alcohol use: Yes  ?  Alcohol/week: 1.0 standard drink  ?  Types: 1 Glasses of wine per week  ?  Comment: everyday  ? Drug use: Never  ? Sexual activity: Not Currently  ?Other Topics Concern  ? Not on file  ?Social History Narrative  ? Right hand  ? Lives alone  ? ?Social Determinants of Health  ? ?Financial Resource Strain: Not on file  ?Food Insecurity: Not on file  ?Transportation Needs: Not on file  ?Physical Activity: Not on file  ?Stress: Not on file   ?Social Connections: Not on file  ?Intimate Partner Violence: Not on file  ? ? ? ?PHYSICAL EXAM: ?Vitals:  ? 02/13/22 1408  ?BP: 129/74  ?Pulse: 68  ?Resp: 18  ?SpO2: 95%  ? ?General: No acute distre

## 2022-02-13 NOTE — Telephone Encounter (Signed)
Spoke with Jeneen Rinks. He is unsure without looking up patient. He will be coming by office tomorrow and will bring what he has at that time by.  ? ?Harrell Lark 02/13/22 4:00 PM ? ?

## 2022-02-14 NOTE — Telephone Encounter (Signed)
[name] came by today and he does not have anything on her. He is waiting on a call back from the guy that does the actual sleep studies ?

## 2022-02-15 DIAGNOSIS — R2689 Other abnormalities of gait and mobility: Secondary | ICD-10-CM | POA: Diagnosis not present

## 2022-02-15 DIAGNOSIS — R2681 Unsteadiness on feet: Secondary | ICD-10-CM | POA: Diagnosis not present

## 2022-02-17 ENCOUNTER — Encounter: Payer: Self-pay | Admitting: Family Medicine

## 2022-02-17 DIAGNOSIS — E782 Mixed hyperlipidemia: Secondary | ICD-10-CM

## 2022-02-17 HISTORY — DX: Mixed hyperlipidemia: E78.2

## 2022-02-17 NOTE — Assessment & Plan Note (Signed)
Management by endocrinology.  ?

## 2022-02-17 NOTE — Assessment & Plan Note (Signed)
Start wellbutrin xl 150 mg qam. ?

## 2022-02-17 NOTE — Assessment & Plan Note (Signed)
Well controlled.  ?No changes to medicines.  ?Continue to work on eating a healthy diet and exercise.  ?Labs drawn today.  ?

## 2022-02-17 NOTE — Assessment & Plan Note (Signed)
The current medical regimen is effective;  continue present plan and medications. Check labs. 

## 2022-02-17 NOTE — Assessment & Plan Note (Signed)
Check tsh 

## 2022-02-19 ENCOUNTER — Telehealth: Payer: Self-pay

## 2022-02-19 NOTE — Telephone Encounter (Signed)
Patient made aware of sleep study results, Patient wanted to know if she could try the new medication that they have out for sleep apnea? Please advise. ?

## 2022-02-21 NOTE — Telephone Encounter (Signed)
Attempted to reach patient, no answer/voicemail available ?

## 2022-02-21 NOTE — Telephone Encounter (Signed)
Provider recommends referral to sleep specialist.  ? ?[name] 02/21/22 4:57 PM ? ?

## 2022-02-21 NOTE — Telephone Encounter (Signed)
Patient left VM stating she is questioning the inspire apnea implant, states this would replace a cpap. Would like Dr's thoughts.  ? ?Harrell Lark 02/21/22 1:31 PM ? ? ? ?

## 2022-02-22 ENCOUNTER — Other Ambulatory Visit: Payer: Self-pay

## 2022-02-22 DIAGNOSIS — R0683 Snoring: Secondary | ICD-10-CM

## 2022-02-22 DIAGNOSIS — R400 Somnolence: Secondary | ICD-10-CM

## 2022-02-22 NOTE — Telephone Encounter (Signed)
Discussed referral to sleep specialist - patient agreed to see specialist in [facility] or [facility].  Referral put in. ?

## 2022-02-24 ENCOUNTER — Other Ambulatory Visit: Payer: Self-pay | Admitting: Family Medicine

## 2022-02-24 DIAGNOSIS — R351 Nocturia: Secondary | ICD-10-CM

## 2022-02-25 DIAGNOSIS — R2689 Other abnormalities of gait and mobility: Secondary | ICD-10-CM | POA: Diagnosis not present

## 2022-02-25 DIAGNOSIS — R2681 Unsteadiness on feet: Secondary | ICD-10-CM | POA: Diagnosis not present

## 2022-03-01 DIAGNOSIS — M85852 Other specified disorders of bone density and structure, left thigh: Secondary | ICD-10-CM | POA: Diagnosis not present

## 2022-03-01 DIAGNOSIS — N959 Unspecified menopausal and perimenopausal disorder: Secondary | ICD-10-CM | POA: Diagnosis not present

## 2022-03-11 DIAGNOSIS — E1065 Type 1 diabetes mellitus with hyperglycemia: Secondary | ICD-10-CM | POA: Diagnosis not present

## 2022-03-11 DIAGNOSIS — E10319 Type 1 diabetes mellitus with unspecified diabetic retinopathy without macular edema: Secondary | ICD-10-CM | POA: Diagnosis not present

## 2022-03-21 ENCOUNTER — Other Ambulatory Visit: Payer: Self-pay

## 2022-03-21 DIAGNOSIS — Z13820 Encounter for screening for osteoporosis: Secondary | ICD-10-CM

## 2022-03-22 ENCOUNTER — Other Ambulatory Visit: Payer: Self-pay | Admitting: Family Medicine

## 2022-03-22 DIAGNOSIS — R351 Nocturia: Secondary | ICD-10-CM

## 2022-03-25 DIAGNOSIS — G4733 Obstructive sleep apnea (adult) (pediatric): Secondary | ICD-10-CM | POA: Diagnosis not present

## 2022-03-28 DIAGNOSIS — H5711 Ocular pain, right eye: Secondary | ICD-10-CM | POA: Diagnosis not present

## 2022-03-28 DIAGNOSIS — H538 Other visual disturbances: Secondary | ICD-10-CM | POA: Diagnosis not present

## 2022-03-28 DIAGNOSIS — H5461 Unqualified visual loss, right eye, normal vision left eye: Secondary | ICD-10-CM | POA: Diagnosis not present

## 2022-03-28 DIAGNOSIS — R519 Headache, unspecified: Secondary | ICD-10-CM | POA: Diagnosis not present

## 2022-03-28 DIAGNOSIS — E103553 Type 1 diabetes mellitus with stable proliferative diabetic retinopathy, bilateral: Secondary | ICD-10-CM | POA: Diagnosis not present

## 2022-03-28 DIAGNOSIS — H4322 Crystalline deposits in vitreous body, left eye: Secondary | ICD-10-CM | POA: Diagnosis not present

## 2022-03-28 DIAGNOSIS — G4451 Hemicrania continua: Secondary | ICD-10-CM | POA: Diagnosis not present

## 2022-04-10 DIAGNOSIS — E10319 Type 1 diabetes mellitus with unspecified diabetic retinopathy without macular edema: Secondary | ICD-10-CM | POA: Diagnosis not present

## 2022-04-10 DIAGNOSIS — E1065 Type 1 diabetes mellitus with hyperglycemia: Secondary | ICD-10-CM | POA: Diagnosis not present

## 2022-04-11 DIAGNOSIS — M25512 Pain in left shoulder: Secondary | ICD-10-CM | POA: Diagnosis not present

## 2022-04-11 DIAGNOSIS — G8929 Other chronic pain: Secondary | ICD-10-CM | POA: Diagnosis not present

## 2022-04-18 DIAGNOSIS — E103553 Type 1 diabetes mellitus with stable proliferative diabetic retinopathy, bilateral: Secondary | ICD-10-CM | POA: Diagnosis not present

## 2022-04-18 DIAGNOSIS — B0233 Zoster keratitis: Secondary | ICD-10-CM | POA: Diagnosis not present

## 2022-04-18 DIAGNOSIS — H4322 Crystalline deposits in vitreous body, left eye: Secondary | ICD-10-CM | POA: Diagnosis not present

## 2022-04-29 DIAGNOSIS — L578 Other skin changes due to chronic exposure to nonionizing radiation: Secondary | ICD-10-CM | POA: Diagnosis not present

## 2022-04-29 DIAGNOSIS — D485 Neoplasm of uncertain behavior of skin: Secondary | ICD-10-CM | POA: Diagnosis not present

## 2022-04-29 DIAGNOSIS — L821 Other seborrheic keratosis: Secondary | ICD-10-CM | POA: Diagnosis not present

## 2022-05-01 ENCOUNTER — Other Ambulatory Visit: Payer: Self-pay | Admitting: Family Medicine

## 2022-05-06 DIAGNOSIS — M25511 Pain in right shoulder: Secondary | ICD-10-CM | POA: Diagnosis not present

## 2022-05-06 DIAGNOSIS — G8929 Other chronic pain: Secondary | ICD-10-CM | POA: Diagnosis not present

## 2022-05-08 ENCOUNTER — Other Ambulatory Visit: Payer: Self-pay | Admitting: Family Medicine

## 2022-05-11 DIAGNOSIS — E1065 Type 1 diabetes mellitus with hyperglycemia: Secondary | ICD-10-CM | POA: Diagnosis not present

## 2022-05-11 DIAGNOSIS — E10319 Type 1 diabetes mellitus with unspecified diabetic retinopathy without macular edema: Secondary | ICD-10-CM | POA: Diagnosis not present

## 2022-05-13 DIAGNOSIS — Z794 Long term (current) use of insulin: Secondary | ICD-10-CM | POA: Diagnosis not present

## 2022-05-13 DIAGNOSIS — E039 Hypothyroidism, unspecified: Secondary | ICD-10-CM | POA: Diagnosis not present

## 2022-05-13 DIAGNOSIS — E10319 Type 1 diabetes mellitus with unspecified diabetic retinopathy without macular edema: Secondary | ICD-10-CM | POA: Diagnosis not present

## 2022-05-13 DIAGNOSIS — E782 Mixed hyperlipidemia: Secondary | ICD-10-CM | POA: Diagnosis not present

## 2022-05-17 LAB — HM DIABETES EYE EXAM

## 2022-05-24 DIAGNOSIS — Z0289 Encounter for other administrative examinations: Secondary | ICD-10-CM | POA: Diagnosis not present

## 2022-05-28 ENCOUNTER — Telehealth: Payer: Self-pay

## 2022-05-28 DIAGNOSIS — Z794 Long term (current) use of insulin: Secondary | ICD-10-CM | POA: Diagnosis not present

## 2022-05-28 DIAGNOSIS — E119 Type 2 diabetes mellitus without complications: Secondary | ICD-10-CM | POA: Diagnosis not present

## 2022-05-28 DIAGNOSIS — G4733 Obstructive sleep apnea (adult) (pediatric): Secondary | ICD-10-CM | POA: Diagnosis not present

## 2022-05-28 DIAGNOSIS — Z9181 History of falling: Secondary | ICD-10-CM | POA: Diagnosis not present

## 2022-05-28 NOTE — Telephone Encounter (Signed)
Called patient on May 27, 2022 in an attempt to schedule an AWV- unable to reach her by phone- left voice message.  Gentry Fitz, RN  05/28/2022 10:06 AM

## 2022-06-03 ENCOUNTER — Other Ambulatory Visit: Payer: Self-pay | Admitting: Family Medicine

## 2022-06-03 DIAGNOSIS — T464X5A Adverse effect of angiotensin-converting-enzyme inhibitors, initial encounter: Secondary | ICD-10-CM

## 2022-06-03 DIAGNOSIS — E1065 Type 1 diabetes mellitus with hyperglycemia: Secondary | ICD-10-CM

## 2022-06-04 DIAGNOSIS — G4733 Obstructive sleep apnea (adult) (pediatric): Secondary | ICD-10-CM | POA: Diagnosis not present

## 2022-06-10 DIAGNOSIS — E10319 Type 1 diabetes mellitus with unspecified diabetic retinopathy without macular edema: Secondary | ICD-10-CM | POA: Diagnosis not present

## 2022-06-10 DIAGNOSIS — E1065 Type 1 diabetes mellitus with hyperglycemia: Secondary | ICD-10-CM | POA: Diagnosis not present

## 2022-06-18 ENCOUNTER — Telehealth: Payer: Self-pay | Admitting: Neurology

## 2022-06-18 NOTE — Telephone Encounter (Signed)
Patients son called and stated that his mom is showing more symptoms.  He states her hand tremors have increased, gait while walking, multiple fall the last few weeks.  He is concerned.

## 2022-06-18 NOTE — Telephone Encounter (Signed)
Son called back again to add Hydie has had a lot of daytime sleepiness and napping.

## 2022-06-19 NOTE — Telephone Encounter (Signed)
Called patients son and explained we can get patient in to see Clarise Cruz today or later this week to be examined. I have also sent message up front to scheduling with the same details

## 2022-06-19 NOTE — Telephone Encounter (Signed)
Ok to schedule with Clarise Cruz for earlier appt, thanks

## 2022-06-20 ENCOUNTER — Encounter: Payer: Self-pay | Admitting: Physician Assistant

## 2022-06-20 ENCOUNTER — Ambulatory Visit: Payer: HMO | Admitting: Physician Assistant

## 2022-06-20 VITALS — BP 179/102 | HR 112 | Resp 20 | Ht 63.0 in | Wt 156.0 lb

## 2022-06-20 DIAGNOSIS — R2681 Unsteadiness on feet: Secondary | ICD-10-CM

## 2022-06-20 NOTE — Patient Instructions (Signed)
Follow up with Dr. Delice Lesch Continue your [place] testin Referral to PT Recommend Psychotherathy

## 2022-06-20 NOTE — Progress Notes (Signed)
Assessment/Plan:   Mild Cognitive Impairment   [name] is a very pleasant 71 y.o. RH female with a history of depression, DM 1, hypertension, hypothyroidism, status post right dorsal pons stroke with no residual deficits, presenting today in follow-up for evaluation of memory loss.  She is on donepezil 10 mg daily, tolerating well.  Last MMSE was 30/30, with normal neurological exam except for mild tremors, but without parkinsonian concerns.  It is unclear the nature of the falls, however the patient is taking several medications including Ativan, which may be contributing to some of her balance and sleepiness during the day.   Recommendations:   Continue donepezil 10 mg daily Side effects were discussed Continue to monitor mood as per PCP, recommend psychotherapy Continue secondary stroke prevention, continue aspirin 81 mg daily. Referral to PT for strength and balance Follow up with Dr. Delice Lesch, the patient has an appointment in October of this year. Recommend that she go over all her medications with her primary care physician, to evaluate if any of this could be causing increased sleepiness as well as possibly mild tremors. Continue neurocognitive testing, planned for later this year.   Case discussed with Dr. Delice Lesch who agrees with the plan   Subjective:   This patient is accompanied in the office by who supplements the history.  Previous records as well as any outside records available were reviewed prior to todays visit.  She was last seen on 02/05/2022 at which time her MMSE was 30/30.  She is not on antidementia medication.   Any changes in memory since last visit?  She continues to have trouble with names, but denies any difficulty with complex tasks.  There are no new cognitive changes. Patient lives with: son  repeats oneself?  Endorsed Disoriented when walking into a room?  Sometimes   Leaving objects in unusual places?  Patient denies   Ambulates  with  difficulty?  She broke her heel in September 2022, affecting her stance, but she received physical therapy which had initially improved her mobility.  However, her son now reports that her gait has changed with walking, and she had multiple falls over the last few weeks.  He says that she had 3 consecutive days in which she had several incidents, 1 heating left face but not the head, without loss of consciousness.  He reports that her balance is still poor and she is stiff, with worsening posture and shuffling of her feet.  In the past, she did physical therapy which was helpful. History of seizures?   Patient denies   Wandering behavior?  Patient denies   Patient drives?   Scared to drive in the daytime, coordination is declined, not as comfortable than before   Any mood changes since last visit? Depressed in the mornings, then her mood improves.  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  Any worsening depression?:  Patient denies   Hallucinations?  Patient denies   Paranoia?  Patient denies   Patient reports that she sleeps a lot during the daytime, napping frequently, although she attributes this for being bored.  She denies any vivid dreams, REM behavior or sleepwalking.  Of note, the patient does take Ativan 0.5 mg for anxiety, Effexor 150 mg daily, Wellbutrin XL 150 mg daily as well.   History of sleep apnea? Endorsed.   she has not been using her CPAP.   Any hygiene concerns?  Patient denies   Independent of bathing and dressing?  Endorsed  Does the patient  needs help with medications?  Denies Who is in charge of the finances?   is in charge    Any changes in appetite?  Depends on the sugar  Patient have trouble swallowing? Patient denies   Does the patient cook?  Patient denies   Any kitchen accidents such as leaving the stove on? Patient denies   Any headaches?  Patient denies   Any focal numbness or tingling?  Patient denies   Chronic back pain Patient denies   Unilateral weakness?   Patient denies   Any tremors?  Son reports that her hand tremors have increased.  She denies dropping objects, diplopia, worsening headaches, micrographia.  Any history of anosmia?  Patient denies   Any incontinence of urine?  Endorsed, chronic Any bowel dysfunction?   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 She started noticing changes last school year, she was told by co-workers that she was asking the same questions. Her son has anxiety and has been concerned because she comes down to the kitchen and does not see him to her side. He thinks there is something wrong. She has occasional word-finding difficulties. She denies getting lost driving, no missed bills or medications. She denies any difficulties with ADLs. She drinks 1-2 glasses of wine every night for the past 8-10 years. She denies any head injuries. She reports they were told her father has dementia, but they are unsure if true. Her sister had strokes with some cognitive changes. She has a history of anxiety and depression, reporting rare anxiety. She denies any worsening stress, but that she had to learn a lot of new things for the new school year. She denies any headaches, dizziness, diplopia, dysarthria, dysphagia, neck/back pain, focal numbness/tingling/weakness, bowel/bladder dysfunction. No anosmia, tremors, no falls.    TSH and B12 done at PCP office were normal. She had an MRI brain with and without contrast done 04/05/16 which was reported as unremarkable, mild chronic small vessel disease and atrophy, no acute changes. Images unavailable for review.   Update 01/22/2021: She presents for an earlier visit today after recent hospitalization on 01/10/21 for stroke. She presented to [facility] for generalized malaise. She had a colonoscopy 3 weeks prior and while doing  the prep, she began having abdominal cramping, headaches, generalized aches, which did not resolve after the procedure. She feels lightheaded when standing. She was found to have orthostatic hypotension and Lisinopril was stopped. CT head and CTA angiogram no acute changes, there was some narrowing of the V4 segment of the right vertebral artery. MRI brain showed a subacute small vessel infarct in the right dorsal pons. Echocardiogram no significant abnormalities. She was started on aspirin and Plavix, then after 2 weeks stop Plavix. LDL was 113, rosuvastatin dose increased to '20mg'$  daily. HbA1c was 9. She was evaluated by PT and OT with no deficits seen on exam. She was also noted to report daily alcohol use. She had a migraine on hospital admission treated with migraine cocktail.    Past Medical History:  Diagnosis Date   Depression    Diabetes mellitus without complication (HCC)    GERD (gastroesophageal reflux disease)    Hypertension    Hypothyroidism    Stroke ([place])    Type 1 diabetes ([place])      Past Surgical History:  Procedure Laterality Date  APPENDECTOMY     APPLICATION OF WOUND VAC Left 08/03/2019   Procedure: Application Of Wound Vac;  Surgeon: Altamese Woodstock, MD;  Location: [place];  Service: Orthopedics;  Laterality: Left;   CESAREAN SECTION     ESOPHAGOGASTRODUODENOSCOPY (EGD) WITH PROPOFOL N/A 02/02/2016   Procedure: ESOPHAGOGASTRODUODENOSCOPY (EGD) WITH PROPOFOL;  Surgeon: Josefine Class, MD;  Location: [facility] ENDOSCOPY;  Service: Endoscopy;  Laterality: N/A;   HAND SURGERY     JOINT REPLACEMENT Bilateral    Knee    ORIF ANKLE FRACTURE Left 08/03/2019   with wound vac applied    ORIF ANKLE FRACTURE Left 08/03/2019   Procedure: OPEN REDUCTION INTERNAL FIXATION (ORIF) ANKLE FRACTURE;  Surgeon: Altamese [place]ville, MD;  Location: [place];  Service: Orthopedics;  Laterality: Left;   TIBIALIS TENDON TRANSFER / REPAIR       PREVIOUS MEDICATIONS:   CURRENT MEDICATIONS:  Outpatient  Encounter Medications as of 06/20/2022  Medication Sig   acyclovir (ZOVIRAX) 800 MG tablet TAKE 1 TABLET (800 MG) BY MOUTH 4 TIMES PER DAY FOR 3-7 DAYS AS NEEDED   amLODipine (NORVASC) 10 MG tablet TAKE 1 TABLET BY MOUTH EVERY DAY   aspirin EC 81 MG tablet Take 81 mg by mouth 2 (two) times a week.    buPROPion (WELLBUTRIN XL) 150 MG 24 hr tablet TAKE 1 TABLET BY MOUTH EVERY DAY   calcium carbonate (OSCAL) 1500 (600 Ca) MG TABS tablet Take by mouth 2 (two) times daily with a meal.   ferrous sulfate 325 (65 FE) MG tablet Take 325 mg by mouth daily with breakfast.   insulin aspart (NOVOLOG) 100 UNIT/ML injection Inject 2-3 Units into the skin 3 (three) times daily before meals. Inject 6 units every am, 6 units at noon and 3 units at bedtime.    insulin glargine (LANTUS) 100 UNIT/ML injection Inject 0.15 mLs (15 Units total) into the skin every evening. Reported on 02/02/2016 (Patient taking differently: Inject 14 Units into the skin every evening. Reported on 02/02/2016)   levothyroxine (SYNTHROID) 150 MCG tablet TAKE 1 TABLET BY MOUTH EVERY DAY BEFORE BREAKFAST   LORazepam (ATIVAN) 0.5 MG tablet TAKE 1 TABLET BY MOUTH EVERY DAY AS NEEDED FOR ANXIETY   metoCLOPramide (REGLAN) 10 MG tablet Take 1 tablet (10 mg total) by mouth daily.   Omega-3 Fatty Acids (FISH OIL PO) Take 1 capsule by mouth daily.   pantoprazole (PROTONIX) 40 MG tablet TAKE 1 TABLET BY MOUTH EVERY DAY   rosuvastatin (CRESTOR) 40 MG tablet Take 1 tablet (40 mg total) by mouth daily.   venlafaxine XR (EFFEXOR-XR) 150 MG 24 hr capsule TAKE 1 CAPSULE BY MOUTH EVERY DAY   Vitamin D, Ergocalciferol, (DRISDOL) 1.25 MG (50000 UNIT) CAPS capsule TAKE 1 CAPSULE BY MOUTH EACH WEEK   [DISCONTINUED] losartan (COZAAR) 50 MG tablet TAKE 1 TABLET BY MOUTH EVERY DAY   clopidogrel (PLAVIX) 75 MG tablet Take 1 tablet (75 mg total) by mouth daily. (Patient not taking: Reported on 02/13/2022)   famotidine (PEPCID) 20 MG tablet Take 20 mg by mouth 2 (two)  times daily. (Patient not taking: Reported on 01/31/2022)   No facility-administered encounter medications on file as of 06/20/2022.     Objective:     PHYSICAL EXAMINATION:    VITALS:   Vitals:   06/20/22 1304 06/20/22 1406  BP: (!) 182/104 (!) 179/102  Pulse: (!) 112   Resp: 20   SpO2: 95%   Weight: 156 lb (70.8 kg)   Height: '5\' 3"'$  (1.6 m)  GEN:  The patient appears stated age and is in NAD. HEENT:  Normocephalic, atraumatic.   Neurological examination:  General: NAD, well-groomed, appears stated age. Orientation: The patient is alert. Oriented to person, place and date Cranial nerves: There is good facial symmetry.no flat affect.anxious appearing the speech is fluent and clear. No aphasia or dysarthria. Fund of knowledge is appropriate. Recent memory impaired and remote memory is normal.  Attention and concentration are normal.  Able to name objects and repeat phrases.  Hearing is intact to conversational tone.    Sensation: Sensation is intact to light touch throughout Motor: Strength is at least antigravity x4. DTR's 2/4 in UE/LE, no glabellar or mental reflexes     04/09/2019    9:00 AM 07/04/2016   12:00 PM  Montreal Cognitive Assessment   Visuospatial/ Executive (0/5) 0 5  Naming (0/3) 0 3  Attention: Read list of digits (0/2) 2 2  Attention: Read list of letters (0/1) 1 1  Attention: Serial 7 subtraction starting at 100 (0/3) 3 3  Language: Repeat phrase (0/2) 2 2  Language : Fluency (0/1) 1 1  Abstraction (0/2) 2 2  Delayed Recall (0/5) 3 5  Orientation (0/6) 6 6  Total 20 30       02/13/2022    3:00 PM 11/28/2021    2:15 PM 09/11/2020   10:00 AM  MMSE - Mini Mental State Exam  Orientation to time '4 5 5  '$ Orientation to time comments  2023, Winter, 11th, Wednesday, January   Orientation to Place '5 5 5  '$ Orientation to Place-comments  [facility], [place], [place], [place], [address]   Registration '3 3 3  '$ Registration-comments  [place], Geophysicist/field seismologist and Pen   Attention/  Calculation '5 5 5  '$ Attention/Calculation-comments  [employer] backwards. DLROW   Recall '3 2 3  '$ Recall-comments  Hat, Pen, X   Language- name 2 objects '2 2 2  '$ Language- name 2 objects-comments  Pen, Watch   Language- repeat '1 1 1  '$ Language- repeat-comments  No if's, and's or but's   Language- follow 3 step command '3 3 3  '$ Language- follow 3 step command-comments  Pick paper up with right hand and fold in half then place back onto the table.   Language- read & follow direction '1 1 1  '$ Language-read & follow direction-comments  Close your eyes.   Write a sentence '1 1 1  '$ Write a sentence-comments  "My son is here with me at Dr. Alyse Low."   Copy design '1 1 1  '$ Total score '29 29 30       '$ Movement examination: Tone: There is normal tone in the UE/LE.  No cogwheeling. Abnormal movements: She has mild bilateral tremors, left greater than right.  No tremors on her legs or face.  No myoclonus.  No asterixis.   Coordination:  There is no decremation with RAM's. Normal finger to nose  Gait and Station: The patient has no difficulty arising out of a deep-seated chair without the use of the hands. The patient's stride length is good.  Gait is cautious and narrow.  In today's visit no shuffling was noted.  Thank you for allowing [place] the opportunity to participate in the care of this nice patient. Please do not hesitate to contact us for any questions or concerns.   Total time spent on today's visit was 43 minutes dedicated to this patient today, preparing to see patient, examining the patient, ordering tests and/or medications and counseling the patient, documenting clinical information  in the EHR or other health record, independently interpreting results and communicating results to the patient/family, discussing treatment and goals, answering patient's questions and coordinating care.  Cc:  Rochel Brome, MD  Sharene Butters 06/21/2022 3:46 PM

## 2022-06-21 ENCOUNTER — Other Ambulatory Visit: Payer: Self-pay

## 2022-06-21 ENCOUNTER — Telehealth: Payer: Self-pay

## 2022-06-21 DIAGNOSIS — R058 Other specified cough: Secondary | ICD-10-CM

## 2022-06-21 DIAGNOSIS — E1065 Type 1 diabetes mellitus with hyperglycemia: Secondary | ICD-10-CM

## 2022-06-21 MED ORDER — LOSARTAN POTASSIUM 50 MG PO TABS: 50.0000 mg | ORAL_TABLET | Freq: Two times a day (BID) | ORAL | 1 refills | Status: DC

## 2022-06-21 NOTE — Telephone Encounter (Signed)
Patient calling as she is concerned about hypertension. Her BP is typically normal with her medications. Yesterday at neuro appointment BP was 179/102. Today at [facility] patient's BP is 154/87. She denies headache, chest pain/tightness, SHoB. She does have an upcoming appointment on Monday 8/7 but she is unsure if changes need to be made prior. Please advise.  Royce Macadamia, [place] 06/21/22 10:32 AM

## 2022-06-21 NOTE — Telephone Encounter (Signed)
Patient made aware, Verbalized understanding.

## 2022-06-24 ENCOUNTER — Encounter: Payer: Self-pay | Admitting: Family Medicine

## 2022-06-24 ENCOUNTER — Ambulatory Visit: Payer: HMO

## 2022-06-24 ENCOUNTER — Other Ambulatory Visit: Payer: Self-pay

## 2022-06-24 ENCOUNTER — Ambulatory Visit (INDEPENDENT_AMBULATORY_CARE_PROVIDER_SITE_OTHER): Payer: HMO | Admitting: Family Medicine

## 2022-06-24 VITALS — BP 168/100 | HR 102 | Temp 97.2°F | Resp 16 | Ht 63.0 in | Wt 157.0 lb

## 2022-06-24 DIAGNOSIS — E1065 Type 1 diabetes mellitus with hyperglycemia: Secondary | ICD-10-CM

## 2022-06-24 DIAGNOSIS — E1159 Type 2 diabetes mellitus with other circulatory complications: Secondary | ICD-10-CM | POA: Diagnosis not present

## 2022-06-24 DIAGNOSIS — F331 Major depressive disorder, recurrent, moderate: Secondary | ICD-10-CM | POA: Diagnosis not present

## 2022-06-24 DIAGNOSIS — E782 Mixed hyperlipidemia: Secondary | ICD-10-CM

## 2022-06-24 DIAGNOSIS — R413 Other amnesia: Secondary | ICD-10-CM

## 2022-06-24 DIAGNOSIS — E039 Hypothyroidism, unspecified: Secondary | ICD-10-CM

## 2022-06-24 DIAGNOSIS — I152 Hypertension secondary to endocrine disorders: Secondary | ICD-10-CM

## 2022-06-24 DIAGNOSIS — R251 Tremor, unspecified: Secondary | ICD-10-CM

## 2022-06-24 MED ORDER — DONEPEZIL HCL 5 MG PO TABS: 5.0000 mg | ORAL_TABLET | Freq: Every day | ORAL | 0 refills | Status: DC

## 2022-06-24 MED ORDER — VORTIOXETINE HBR 5 MG PO TABS: 5.0000 mg | ORAL_TABLET | Freq: Every day | ORAL | 0 refills | Status: DC

## 2022-06-24 MED ORDER — VENLAFAXINE HCL ER 75 MG PO CP24: 75.0000 mg | ORAL_CAPSULE | Freq: Every day | ORAL | 0 refills | Status: DC

## 2022-06-24 MED ORDER — PROPRANOLOL HCL ER 60 MG PO CP24: 60.0000 mg | ORAL_CAPSULE | Freq: Every day | ORAL | 0 refills | Status: DC

## 2022-06-24 NOTE — Patient Instructions (Addendum)
Hypertension and Tremor: Start propranolol ER 60 mg once daily.   In 2 weeks: Start on trintillex 5 mg once daily.  Decrease effexor xr 75 mg once daily in am x 1 week, then discontinue.

## 2022-06-24 NOTE — Progress Notes (Signed)
Subjective:  Patient ID: [name], female    DOB: 1951-05-19  Age: 71 y.o. MRN: [id number]  Chief Complaint  Patient presents with   Dizziness   Tremors   Diabetes   Dementia    HPI  Diabetes:  Complications: Hyperlipidemia, Hypertension. Glucose checking: 5-6 times a day. Glucose logs: 160's mg/dl Hypoglycemia: no often. Most recent A1C: 7.6% Current medications: Lantus 16 units every evening, Novolog slidding scale. Last Eye Exam: Few years ago. Foot checks: none  Hyperlipidemia: Current medications: Rosuvastatin 40 mg daily, Fish Oil 1 capsule daily.  Hypertension: Complications: Current medications: Amlodipine 10 mg daily, Losartan 50 mg daily, aspirin 81 mg daily.   Depression: [name] takes Wellbutrin XL 150 mg everyday, Effexor 150 mg daily. [name] held wellbutrin xl for a period and then restarted it. Patient is depressed.  Patient's son is concerned about her memory is getting worse, and tremors. Tremors did not improve with holding wellbutrin xl.  [name] mentioned [name] has had 3 falls in the past 2 weeks ago and [name] thinks that [name] had a contusion. Increased daytime somnolence.  Patient has had 3 falls in the last month. It was [facility].    Current Outpatient Medications on File Prior to Visit  Medication Sig Dispense Refill   acyclovir (ZOVIRAX) 800 MG tablet TAKE 1 TABLET (800 MG) BY MOUTH 4 TIMES PER DAY FOR 3-7 DAYS AS NEEDED 30 tablet 2   amLODipine (NORVASC) 10 MG tablet TAKE 1 TABLET BY MOUTH EVERY DAY 90 tablet 3   aspirin EC 81 MG tablet Take 81 mg by mouth 2 (two) times a week.      buPROPion (WELLBUTRIN XL) 150 MG 24 hr tablet TAKE 1 TABLET BY MOUTH EVERY DAY 90 tablet 3   calcium carbonate (OSCAL) 1500 (600 Ca) MG TABS tablet Take by mouth 2 (two) times daily with a meal.     clopidogrel (PLAVIX) 75 MG tablet Take 1 tablet (75 mg total) by mouth daily. 14 tablet 0   famotidine (PEPCID) 20 MG tablet Take 20 mg by mouth  2 (two) times daily.     ferrous sulfate 325 (65 FE) MG tablet Take 325 mg by mouth daily with breakfast.     insulin aspart (NOVOLOG) 100 UNIT/ML injection Inject 2-3 Units into the skin 3 (three) times daily before meals. Inject 6 units every am, 6 units at noon and 3 units at bedtime.      insulin glargine (LANTUS) 100 UNIT/ML injection Inject 0.15 mLs (15 Units total) into the skin every evening. Reported on 02/02/2016 (Patient taking differently: Inject 16 Units into the skin every evening. Reported on 02/02/2016) 10 mL 1   losartan (COZAAR) 50 MG tablet Take 1 tablet (50 mg total) by mouth 2 (two) times daily. 90 tablet 1   Omega-3 Fatty Acids (FISH OIL PO) Take 1 capsule by mouth daily.     omeprazole (PRILOSEC) 40 MG capsule Take 40 mg by mouth daily.     rosuvastatin (CRESTOR) 40 MG tablet Take 1 tablet (40 mg total) by mouth daily. 90 tablet 1   venlafaxine XR (EFFEXOR-XR) 150 MG 24 hr capsule TAKE 1 CAPSULE BY MOUTH EVERY DAY 90 capsule 1   Vitamin D, Ergocalciferol, (DRISDOL) 1.25 MG (50000 UNIT) CAPS capsule TAKE 1 CAPSULE BY MOUTH EACH WEEK 12 capsule 3   No current facility-administered medications on file prior to visit.   Past Medical History:  Diagnosis Date   Depression  Diabetes mellitus without complication ([place])    GERD (gastroesophageal reflux disease)    Hypertension    Hypothyroidism    Stroke ([place])    Type 1 diabetes ([place])    Past Surgical History:  Procedure Laterality Date   APPENDECTOMY     APPLICATION OF WOUND VAC Left 08/03/2019   Procedure: Application Of Wound Vac;  Surgeon: Altamese [place], MD;  Location: [place];  Service: Orthopedics;  Laterality: Left;   CESAREAN SECTION     ESOPHAGOGASTRODUODENOSCOPY (EGD) WITH PROPOFOL N/A 02/02/2016   Procedure: ESOPHAGOGASTRODUODENOSCOPY (EGD) WITH PROPOFOL;  Surgeon: Josefine Class, MD;  Location: [facility] ENDOSCOPY;  Service: Endoscopy;  Laterality: N/A;   HAND SURGERY     JOINT REPLACEMENT Bilateral    Knee     ORIF ANKLE FRACTURE Left 08/03/2019   with wound vac applied    ORIF ANKLE FRACTURE Left 08/03/2019   Procedure: OPEN REDUCTION INTERNAL FIXATION (ORIF) ANKLE FRACTURE;  Surgeon: Altamese [place], MD;  Location: [place];  Service: Orthopedics;  Laterality: Left;   TIBIALIS TENDON TRANSFER / REPAIR      Family History  Problem Relation Age of Onset   Cancer Mother        Colon cancer    Dementia Father    Neuropathy Father    Social History   Socioeconomic History   Marital status: Single    Spouse name: Not on file   Number of children: 2   Years of education: 49   Highest education level: Not on file  Occupational History   Not on file  Tobacco Use   Smoking status: Never   Smokeless tobacco: Never  Vaping Use   Vaping Use: Never used  Substance and Sexual Activity   Alcohol use: Yes    Alcohol/week: 1.0 standard drink of alcohol    Types: 1 Glasses of wine per week    Comment: everyday   Drug use: Never   Sexual activity: Not Currently  Other Topics Concern   Not on file  Social History Narrative   Right hand   Lives alone   Drinks caffeine   An apartment building   Social Determinants of Health   Financial Resource Strain: Not on file  Food Insecurity: Not on file  Transportation Needs: Not on file  Physical Activity: Not on file  Stress: Not on file  Social Connections: Not on file    Review of Systems  Constitutional:  Negative for chills, fatigue and fever.  HENT:  Negative for congestion, ear pain and sore throat.   Respiratory:  Negative for cough and shortness of breath.   Cardiovascular:  Negative for chest pain and palpitations.  Gastrointestinal:  Negative for abdominal pain, constipation, diarrhea, nausea and vomiting.  Endocrine: Negative for polydipsia, polyphagia and polyuria.  Genitourinary:  Negative for difficulty urinating and dysuria.  Musculoskeletal:  Negative for arthralgias, back pain and myalgias.  Skin:  Negative for rash.   Neurological:  Positive for dizziness and headaches.  Psychiatric/Behavioral:  Negative for dysphoric mood. The patient is not nervous/anxious.      Objective:  BP (!) 168/100   Pulse (!) 102   Temp (!) 97.2 F (36.2 C)   Resp 16   Ht '5\' 3"'$  (1.6 m)   Wt 157 lb (71.2 kg)   SpO2 98%   BMI 27.81 kg/m      06/24/2022    3:53 PM 06/20/2022    2:06 PM 06/20/2022    1:04 PM  BP/Weight  Systolic  BP 194 174 081  Diastolic BP 448 185 631  Wt. (Lbs) 157  156  BMI 27.81 kg/m2  27.63 kg/m2    Physical Exam Vitals reviewed.  Constitutional:      Appearance: Normal appearance. [name] is normal weight.  Neck:     Vascular: No carotid bruit.  Cardiovascular:     Rate and Rhythm: Normal rate and regular rhythm.     Heart sounds: Normal heart sounds.  Pulmonary:     Effort: Pulmonary effort is normal. No respiratory distress.     Breath sounds: Normal breath sounds.  Abdominal:     General: Abdomen is flat. Bowel sounds are normal.     Palpations: Abdomen is soft.     Tenderness: There is no abdominal tenderness.  Neurological:     Mental Status: [name] is alert.  Psychiatric:        Mood and Affect: Mood normal.        Behavior: Behavior normal.    Diabetic Foot Exam - Simple   No data filed      Lab Results  Component Value Date   WBC 4.3 06/24/2022   HGB 12.9 06/24/2022   HCT 38.8 06/24/2022   PLT 309 06/24/2022   GLUCOSE 189 (H) 06/24/2022   CHOL 213 (H) 06/24/2022   TRIG 78 06/24/2022   HDL 118 06/24/2022   LDLCALC 82 06/24/2022   ALT 17 06/24/2022   AST 17 06/24/2022   NA 139 06/24/2022   K 4.1 06/24/2022   CL 101 06/24/2022   CREATININE 0.78 06/24/2022   BUN 14 06/24/2022   CO2 24 06/24/2022   TSH 0.020 (L) 06/24/2022   INR 0.9 08/03/2019   HGBA1C 8.6 (H) 06/24/2022      Assessment & Plan:   Problem List Items Addressed This Visit       Cardiovascular and Mediastinum   Hypertension associated with diabetes ([place])    Add propranolol ER 60 mg in am.        Relevant Medications   propranolol ER (INDERAL LA) 60 MG 24 hr capsule     Endocrine   Type 1 diabetes mellitus with hyperglycemia (HCC) - Primary    Check a1c. Follow up with endocrinology.       Relevant Orders   Hemoglobin A1c   Acquired hypothyroidism    Check tsh.       Relevant Medications   propranolol ER (INDERAL LA) 60 MG 24 hr capsule   Other Relevant Orders   T4, free (Completed)     Other   Memory loss    Check labs. If normal, order mri of brain.  Refer to neurology.       Relevant Medications   donepezil (ARICEPT) 5 MG tablet   Other Relevant Orders   B12 and Folate Panel (Completed)   Methylmalonic acid, serum (Completed)   T4, free (Completed)   ANA w/Reflex (Completed)   MR Brain Wo Contrast   Ambulatory referral to Neurology   Depression, major, recurrent, moderate (HCC)    Decrease effexor xr 75 mg once daily in am x 1 week, then discontinue.  In 2 weeks: Start on trintillex 5 mg once daily.         Relevant Medications   vortioxetine HBr (TRINTELLIX) 5 MG TABS tablet   venlafaxine XR (EFFEXOR-XR) 75 MG 24 hr capsule   Mixed hyperlipidemia   Relevant Medications   propranolol ER (INDERAL LA) 60 MG 24 hr capsule   Tremor  Start propranolol ER 60 mg once daily.        Relevant Medications   propranolol ER (INDERAL LA) 60 MG 24 hr capsule   vortioxetine HBr (TRINTELLIX) 5 MG TABS tablet  .  Meds ordered this encounter  Medications   propranolol ER (INDERAL LA) 60 MG 24 hr capsule    Sig: Take 1 capsule (60 mg total) by mouth daily.    Dispense:  30 capsule    Refill:  0   vortioxetine HBr (TRINTELLIX) 5 MG TABS tablet    Sig: Take 1 tablet (5 mg total) by mouth daily.    Dispense:  30 tablet    Refill:  0   venlafaxine XR (EFFEXOR-XR) 75 MG 24 hr capsule    Sig: Take 1 capsule (75 mg total) by mouth daily with breakfast.    Dispense:  30 capsule    Refill:  0   donepezil (ARICEPT) 5 MG tablet    Sig: Take 1 tablet (5 mg  total) by mouth at bedtime.    Dispense:  90 tablet    Refill:  0    Orders Placed This Encounter  Procedures   MR Brain Wo Contrast   B12 and Folate Panel   Methylmalonic acid, serum   T4, free   ANA w/Reflex   Hemoglobin A1c   Ambulatory referral to Neurology     Follow-up: Return in about 4 weeks (around 07/22/2022).  An After Visit Summary was printed and given to the patient.  Rochel Brome, MD [facility] [phone]

## 2022-06-25 ENCOUNTER — Telehealth: Payer: Self-pay

## 2022-06-25 ENCOUNTER — Telehealth: Payer: Self-pay | Admitting: Neurology

## 2022-06-25 ENCOUNTER — Other Ambulatory Visit: Payer: Self-pay

## 2022-06-25 DIAGNOSIS — E039 Hypothyroidism, unspecified: Secondary | ICD-10-CM

## 2022-06-25 LAB — CARDIOVASCULAR RISK ASSESSMENT

## 2022-06-25 LAB — LIPID PANEL
Chol/HDL Ratio: 1.8 ratio (ref 0.0–4.4)
Cholesterol, Total: 213 mg/dL — ABNORMAL HIGH (ref 100–199)
HDL: 118 mg/dL (ref >39)
LDL Chol Calc (NIH): 82 mg/dL (ref 0–99)
Triglycerides: 78 mg/dL (ref 0–149)
VLDL Cholesterol Cal: 13 mg/dL (ref 5–40)

## 2022-06-25 LAB — CBC
Hematocrit: 38.8 % (ref 34.0–46.6)
Hemoglobin: 12.9 g/dL (ref 11.1–15.9)
MCH: 31.3 pg (ref 26.6–33.0)
MCHC: 33.2 g/dL (ref 31.5–35.7)
MCV: 94 fL (ref 79–97)
Platelets: 309 10*3/uL (ref 150–450)
RBC: 4.12 x10E6/uL (ref 3.77–5.28)
RDW: 11.8 % (ref 11.7–15.4)
WBC: 4.3 10*3/uL (ref 3.4–10.8)

## 2022-06-25 LAB — ANA W/REFLEX: Anti Nuclear Antibody (ANA): NEGATIVE

## 2022-06-25 LAB — COMPREHENSIVE METABOLIC PANEL
ALT: 17 IU/L (ref 0–32)
AST: 17 IU/L (ref 0–40)
Albumin/Globulin Ratio: 2 (ref 1.2–2.2)
Albumin: 4.3 g/dL (ref 3.8–4.8)
Alkaline Phosphatase: 133 IU/L — ABNORMAL HIGH (ref 44–121)
BUN/Creatinine Ratio: 18 (ref 12–28)
BUN: 14 mg/dL (ref 8–27)
Bilirubin Total: 0.2 mg/dL (ref 0.0–1.2)
CO2: 24 mmol/L (ref 20–29)
Calcium: 9.5 mg/dL (ref 8.7–10.3)
Chloride: 101 mmol/L (ref 96–106)
Creatinine, Ser: 0.78 mg/dL (ref 0.57–1.00)
Globulin, Total: 2.1 g/dL (ref 1.5–4.5)
Glucose: 189 mg/dL — ABNORMAL HIGH (ref 70–99)
Potassium: 4.1 mmol/L (ref 3.5–5.2)
Sodium: 139 mmol/L (ref 134–144)
Total Protein: 6.4 g/dL (ref 6.0–8.5)
eGFR: 81 mL/min/{1.73_m2} (ref >59)

## 2022-06-25 LAB — TSH: TSH: 0.02 u[IU]/mL — ABNORMAL LOW (ref 0.450–4.500)

## 2022-06-25 LAB — HEMOGLOBIN A1C
Est. average glucose Bld gHb Est-mCnc: 200 mg/dL
Hgb A1c MFr Bld: 8.6 % — ABNORMAL HIGH (ref 4.8–5.6)

## 2022-06-25 MED ORDER — LEVOTHYROXINE SODIUM 137 MCG PO TABS: 137.0000 ug | ORAL_TABLET | Freq: Every day | ORAL | 0 refills | Status: DC

## 2022-06-25 MED ORDER — EZETIMIBE 10 MG PO TABS: 10.0000 mg | ORAL_TABLET | Freq: Every day | ORAL | 0 refills | Status: DC

## 2022-06-25 NOTE — Telephone Encounter (Signed)
Patient called and stated she picked up her plavix today and wanted to know does she take plavix and aspirin? Please advise.

## 2022-06-25 NOTE — Telephone Encounter (Signed)
Patient said she needs to speak with someone about a prescription that the PA has on her list. She doesn't recall taking this.

## 2022-06-25 NOTE — Telephone Encounter (Signed)
Called apteitns son and expalined the error with starting the  donepezil and that Clarise Cruz did indeed  want thwe patient to start this medication

## 2022-06-25 NOTE — Telephone Encounter (Signed)
Patient made ware, Verbalized Understanding

## 2022-06-26 ENCOUNTER — Encounter: Payer: Self-pay | Admitting: Family Medicine

## 2022-06-26 LAB — B12 AND FOLATE PANEL
Folate: 16.3 ng/mL (ref >3.0)
Vitamin B-12: 1341 pg/mL — ABNORMAL HIGH (ref 232–1245)

## 2022-06-26 LAB — T4, FREE: Free T4: 3.07 ng/dL — ABNORMAL HIGH (ref 0.82–1.77)

## 2022-06-26 LAB — METHYLMALONIC ACID, SERUM: Methylmalonic Acid: 136 nmol/L (ref 0–378)

## 2022-06-27 NOTE — Progress Notes (Signed)
Message also said she was not taking amlodipine. Recommend take losartan and  propranolol rather than amlodipine.  Thyroid function abnormal. Free T4 elevated. Dose is too high except patient messaged and has been taking 3 doses.  Patient should only be taking one reflux medicine. Recommend omeprazole 40 mg daily.

## 2022-06-29 ENCOUNTER — Encounter: Payer: Self-pay | Admitting: Family Medicine

## 2022-06-29 DIAGNOSIS — R251 Tremor, unspecified: Secondary | ICD-10-CM | POA: Insufficient documentation

## 2022-06-29 HISTORY — DX: Tremor, unspecified: R25.1

## 2022-06-29 NOTE — Assessment & Plan Note (Signed)
Start propranolol ER 60 mg once daily.

## 2022-06-29 NOTE — Assessment & Plan Note (Signed)
Add propranolol ER 60 mg in am.

## 2022-06-29 NOTE — Assessment & Plan Note (Signed)
Check tsh 

## 2022-06-29 NOTE — Assessment & Plan Note (Signed)
Check a1c. Follow up with endocrinology.

## 2022-06-29 NOTE — Assessment & Plan Note (Addendum)
Check labs. If normal, order mri of brain.  Refer to neurology.

## 2022-06-29 NOTE — Assessment & Plan Note (Signed)
Decrease effexor xr 75 mg once daily in am x 1 week, then discontinue.  In 2 weeks: Start on trintillex 5 mg once daily.

## 2022-07-01 ENCOUNTER — Other Ambulatory Visit: Payer: Self-pay

## 2022-07-01 ENCOUNTER — Telehealth: Payer: Self-pay

## 2022-07-01 MED ORDER — CLOPIDOGREL BISULFATE 75 MG PO TABS: 75.0000 mg | ORAL_TABLET | Freq: Every day | ORAL | 0 refills | Status: DC

## 2022-07-01 NOTE — Telephone Encounter (Signed)
Called patient and made her aware of  provider response to her my chart message. Patient is currently taking 137 mcg, Patient stated she had a MRI done the first of the year. Records was printed from [place]. Patient and her son was made aware of information, verbalized understanding.  Per Dr. Tobie Poet patient does not need MRI (MRI has been Canceled)

## 2022-07-02 ENCOUNTER — Encounter: Payer: Self-pay | Admitting: Psychology

## 2022-07-02 ENCOUNTER — Encounter: Payer: Self-pay | Admitting: Family Medicine

## 2022-07-04 ENCOUNTER — Encounter: Payer: Self-pay | Admitting: Family Medicine

## 2022-07-04 ENCOUNTER — Other Ambulatory Visit: Payer: Self-pay | Admitting: Family Medicine

## 2022-07-04 ENCOUNTER — Encounter: Payer: Self-pay | Admitting: Neurology

## 2022-07-04 ENCOUNTER — Encounter: Payer: Self-pay | Admitting: Physician Assistant

## 2022-07-05 ENCOUNTER — Telehealth: Payer: Self-pay

## 2022-07-05 DIAGNOSIS — R4181 Age-related cognitive decline: Secondary | ICD-10-CM | POA: Diagnosis not present

## 2022-07-05 DIAGNOSIS — R54 Age-related physical debility: Secondary | ICD-10-CM | POA: Diagnosis not present

## 2022-07-05 DIAGNOSIS — Z9181 History of falling: Secondary | ICD-10-CM | POA: Diagnosis not present

## 2022-07-05 NOTE — Telephone Encounter (Signed)
Patient is NOT on donepezil. A smartphrase was pretyped in the note, and was not erased. SHe is not on antidementia meds, last MMSE is 29.

## 2022-07-05 NOTE — Telephone Encounter (Signed)
Glennon Mac called back about his Mom.  He is going to take her to the ED for evaluation and treatment.

## 2022-07-05 NOTE — Telephone Encounter (Signed)
A message was left for Mrs. O'Connor's son to call us back.

## 2022-07-08 ENCOUNTER — Telehealth: Payer: Self-pay

## 2022-07-08 NOTE — Telephone Encounter (Signed)
  Transition Care Management Follow-up Telephone Call  Addelynn Batte 04/04/1951  Admit Date: 07/05/22 Discharge Date: 07/05/22 Diagnoses: Falls   3 day post discharge: [facility] phone call from ED visit at [facility] ER.   Mckenize Mezera was discharged from [facility] ER on 07/05/22 with the diagnoses listed above.  She was contacted today via telephone in regards to transition of care.     Discharge Instructions: Patient was instructed to follow up with PCP in 1 week, and due to schedule openings patient was placed for tomorrow at 3:40, told patient to arrive at 3:20.   Items Reviewed: Medications reviewed: yes no changes Allergies reviewed: yes no changes Dietary changes reviewed: Yes  Referrals reviewed: Yes     Any patient concerns? no  Confirmed importance and date/time of follow-up visits scheduled yes Provider Appointment booked with  Confirmed with patient if condition begins to worsen call PCP or go to the ER.  Patient was given the office number and encouraged to call back with question or concerns.  : yes  Elissa Lovett MA 07/08/22 2:24 PM

## 2022-07-09 ENCOUNTER — Encounter: Payer: Self-pay | Admitting: Family Medicine

## 2022-07-09 ENCOUNTER — Ambulatory Visit (INDEPENDENT_AMBULATORY_CARE_PROVIDER_SITE_OTHER): Payer: HMO | Admitting: Family Medicine

## 2022-07-09 VITALS — BP 124/70 | HR 100 | Temp 97.1°F | Resp 16 | Ht 63.0 in | Wt 159.0 lb

## 2022-07-09 DIAGNOSIS — R413 Other amnesia: Secondary | ICD-10-CM

## 2022-07-09 DIAGNOSIS — E782 Mixed hyperlipidemia: Secondary | ICD-10-CM

## 2022-07-09 DIAGNOSIS — R2689 Other abnormalities of gait and mobility: Secondary | ICD-10-CM

## 2022-07-09 DIAGNOSIS — R82998 Other abnormal findings in urine: Secondary | ICD-10-CM

## 2022-07-09 DIAGNOSIS — F331 Major depressive disorder, recurrent, moderate: Secondary | ICD-10-CM

## 2022-07-09 DIAGNOSIS — R251 Tremor, unspecified: Secondary | ICD-10-CM

## 2022-07-09 LAB — POCT URINALYSIS DIP (CLINITEK)
Bilirubin, UA: NEGATIVE
Blood, UA: NEGATIVE
Glucose, UA: NEGATIVE mg/dL
Ketones, POC UA: NEGATIVE mg/dL
Nitrite, UA: NEGATIVE
Spec Grav, UA: 1.01 (ref 1.010–1.025)
Urobilinogen, UA: 1 E.U./dL
pH, UA: 7.5 (ref 5.0–8.0)

## 2022-07-09 MED ORDER — PROPRANOLOL HCL ER 60 MG PO CP24: 60.0000 mg | ORAL_CAPSULE | Freq: Every day | ORAL | 1 refills | Status: DC

## 2022-07-09 NOTE — Patient Instructions (Signed)
[facility] for [facility].  Continue effexor xr 150 mg daily in am.   Hold off on memory medicine.  Improved of ezetimibe (zetia.) Continue propranolol for tremor.

## 2022-07-09 NOTE — Progress Notes (Signed)
Subjective:  Patient ID: [name], female    DOB: 13-Mar-1951  Age: 71 y.o. MRN: [id number]  Chief Complaint  Patient presents with   Hospitalization Follow-up   HPI [name] comes in for ED follow-up.  She was seen at [facility] on 07/05/2022 following a fall and change in mental status. Patient presented to ER accompanied by her son, who lives with patient. Her son stated mother fell couple weeks ago and injured her head, he noted some decline in her mental state since then. Patient had ct scan of her brain. No abnormalities. Labs normal, except sugar being high. Patient was discharged Stable to home.   Current Outpatient Medications on File Prior to Visit  Medication Sig Dispense Refill   acyclovir (ZOVIRAX) 800 MG tablet TAKE 1 TABLET (800 MG) BY MOUTH 4 TIMES PER DAY FOR 3-7 DAYS AS NEEDED 30 tablet 2   aspirin EC 81 MG tablet Take 81 mg by mouth 2 (two) times a week.      calcium carbonate (OSCAL) 1500 (600 Ca) MG TABS tablet Take by mouth 2 (two) times daily with a meal.     clopidogrel (PLAVIX) 75 MG tablet Take 1 tablet (75 mg total) by mouth daily. 90 tablet 0   insulin aspart (NOVOLOG) 100 UNIT/ML injection Inject 2-3 Units into the skin 3 (three) times daily before meals. Inject 6 units every am, 6 units at noon and 3 units at bedtime.      insulin glargine (LANTUS) 100 UNIT/ML injection Inject 0.15 mLs (15 Units total) into the skin every evening. Reported on 02/02/2016 (Patient taking differently: Inject 16 Units into the skin every evening. Reported on 02/02/2016) 10 mL 1   levothyroxine (SYNTHROID) 137 MCG tablet Take 1 tablet (137 mcg total) by mouth daily before breakfast. 90 tablet 0   rosuvastatin (CRESTOR) 40 MG tablet Take 1 tablet (40 mg total) by mouth daily. 90 tablet 1   venlafaxine XR (EFFEXOR-XR) 150 MG 24 hr capsule TAKE 1 CAPSULE BY MOUTH EVERY DAY 90 capsule 1   Vitamin D, Ergocalciferol, (DRISDOL) 1.25 MG (50000 UNIT) CAPS capsule TAKE 1 CAPSULE BY MOUTH  EACH WEEK 12 capsule 3   ferrous sulfate 325 (65 FE) MG tablet Take 325 mg by mouth daily with breakfast.     losartan (COZAAR) 50 MG tablet Take 1 tablet (50 mg total) by mouth 2 (two) times daily. 90 tablet 1   omeprazole (PRILOSEC) 40 MG capsule Take 40 mg by mouth daily.     No current facility-administered medications on file prior to visit.   Past Medical History:  Diagnosis Date   Depression    Diabetes mellitus without complication (HCC)    GERD (gastroesophageal reflux disease)    Hypertension    Hypothyroidism    Stroke ([place])    Type 1 diabetes ([place])    Past Surgical History:  Procedure Laterality Date   APPENDECTOMY     APPLICATION OF WOUND VAC Left 08/03/2019   Procedure: Application Of Wound Vac;  Surgeon: Altamese [place], MD;  Location: [place];  Service: Orthopedics;  Laterality: Left;   CESAREAN SECTION     ESOPHAGOGASTRODUODENOSCOPY (EGD) WITH PROPOFOL N/A 02/02/2016   Procedure: ESOPHAGOGASTRODUODENOSCOPY (EGD) WITH PROPOFOL;  Surgeon: Josefine Class, MD;  Location: [facility] ENDOSCOPY;  Service: Endoscopy;  Laterality: N/A;   HAND SURGERY     JOINT REPLACEMENT Bilateral    Knee    ORIF ANKLE FRACTURE Left 08/03/2019   with wound vac applied  ORIF ANKLE FRACTURE Left 08/03/2019   Procedure: OPEN REDUCTION INTERNAL FIXATION (ORIF) ANKLE FRACTURE;  Surgeon: Altamese [place], MD;  Location: [place];  Service: Orthopedics;  Laterality: Left;   TIBIALIS TENDON TRANSFER / REPAIR      Family History  Problem Relation Age of Onset   Cancer Mother        Colon cancer    Dementia Father    Neuropathy Father    Social History   Socioeconomic History   Marital status: Single    Spouse name: Not on file   Number of children: 2   Years of education: 7   Highest education level: Not on file  Occupational History   Not on file  Tobacco Use   Smoking status: Never   Smokeless tobacco: Never  Vaping Use   Vaping Use: Never used  Substance and Sexual Activity    Alcohol use: Yes    Alcohol/week: 1.0 standard drink of alcohol    Types: 1 Glasses of wine per week    Comment: everyday   Drug use: Never   Sexual activity: Not Currently  Other Topics Concern   Not on file  Social History Narrative   Right hand   Lives alone   Drinks caffeine   An apartment building   Social Determinants of Health   Financial Resource Strain: Not on file  Food Insecurity: Not on file  Transportation Needs: Not on file  Physical Activity: Not on file  Stress: Not on file  Social Connections: Not on file    Review of Systems  Constitutional:  Positive for fatigue. Negative for chills and fever.  HENT:  Negative for congestion, rhinorrhea and sore throat.   Respiratory:  Negative for cough and shortness of breath.   Cardiovascular:  Negative for chest pain.  Gastrointestinal:  Negative for abdominal pain, constipation, diarrhea, nausea and vomiting.  Genitourinary:  Negative for dysuria and urgency.  Musculoskeletal:  Positive for arthralgias (bilateral shoulder pain) and back pain. Negative for myalgias.  Neurological:  Positive for weakness. Negative for dizziness, light-headedness and headaches.  Psychiatric/Behavioral:  Positive for agitation. Negative for dysphoric mood. The patient is not nervous/anxious.      Objective:  BP 124/70   Pulse 100   Temp (!) 97.1 F (36.2 C)   Resp 16   Ht '5\' 3"'$  (1.6 m)   Wt 159 lb (72.1 kg)   BMI 28.17 kg/m      07/09/2022    4:08 PM 06/24/2022    3:53 PM 06/20/2022    2:06 PM  BP/Weight  Systolic BP 628 315 176  Diastolic BP 70 160 737  Wt. (Lbs) 159 157   BMI 28.17 kg/m2 27.81 kg/m2     Physical Exam Vitals reviewed.  Constitutional:      Appearance: Normal appearance. She is normal weight.  Neck:     Vascular: No carotid bruit.  Cardiovascular:     Rate and Rhythm: Normal rate and regular rhythm.     Heart sounds: Normal heart sounds.  Pulmonary:     Effort: Pulmonary effort is normal. No  respiratory distress.     Breath sounds: Normal breath sounds.  Abdominal:     General: Abdomen is flat. Bowel sounds are normal.     Palpations: Abdomen is soft.     Tenderness: There is no abdominal tenderness.  Neurological:     Mental Status: She is alert.  Psychiatric:        Mood and Affect: Mood  normal.        Behavior: Behavior normal.     Diabetic Foot Exam - Simple   No data filed      Lab Results  Component Value Date   WBC 4.3 06/24/2022   HGB 12.9 06/24/2022   HCT 38.8 06/24/2022   PLT 309 06/24/2022   GLUCOSE 189 (H) 06/24/2022   CHOL 213 (H) 06/24/2022   TRIG 78 06/24/2022   HDL 118 06/24/2022   LDLCALC 82 06/24/2022   ALT 17 06/24/2022   AST 17 06/24/2022   NA 139 06/24/2022   K 4.1 06/24/2022   CL 101 06/24/2022   CREATININE 0.78 06/24/2022   BUN 14 06/24/2022   CO2 24 06/24/2022   TSH 0.020 (L) 06/24/2022   INR 0.9 08/03/2019   HGBA1C 8.6 (H) 06/24/2022      Assessment & Plan:   Problem List Items Addressed This Visit       Other   Memory loss    Hold off on memory medicine.  Patient improved off zetia which was a fairly new medicine.       Depression, major, recurrent, moderate (HCC)    Continue effexor xr 150 mg daily in am.      Mixed hyperlipidemia   Relevant Medications   propranolol ER (INDERAL LA) 60 MG 24 hr capsule   Tremor    Continue propranolol for tremor.      Relevant Medications   propranolol ER (INDERAL LA) 60 MG 24 hr capsule   Imbalance - Primary    Ref[facility] for balance.      Relevant Orders   Ambulatory referral to Physical Therapy   Urine WBC increased    Checked UA.      Relevant Orders   POCT URINALYSIS DIP (CLINITEK) (Completed)  .  Meds ordered this encounter  Medications   propranolol ER (INDERAL LA) 60 MG 24 hr capsule    Sig: Take 1 capsule (60 mg total) by mouth daily.    Dispense:  90 capsule    Refill:  1    Orders Placed This Encounter  Procedures    Ambulatory referral to Physical Therapy   POCT URINALYSIS DIP (CLINITEK)    Marla Scherrie Gerlach, acting as a Education administrator for Rochel Brome, MD.,have documented all relevant documentation on the behalf of Rochel Brome, MD,as directed by  Rochel Brome, MD while in the presence of Rochel Brome, MD.   Follow-up: No follow-ups on file.  An After Visit Summary was printed and given to the patient.  Rochel Brome, MD [facility] [phone]

## 2022-07-11 DIAGNOSIS — R82998 Other abnormal findings in urine: Secondary | ICD-10-CM

## 2022-07-11 DIAGNOSIS — R2689 Other abnormalities of gait and mobility: Secondary | ICD-10-CM | POA: Insufficient documentation

## 2022-07-11 DIAGNOSIS — E1065 Type 1 diabetes mellitus with hyperglycemia: Secondary | ICD-10-CM | POA: Diagnosis not present

## 2022-07-11 DIAGNOSIS — E10319 Type 1 diabetes mellitus with unspecified diabetic retinopathy without macular edema: Secondary | ICD-10-CM | POA: Diagnosis not present

## 2022-07-11 HISTORY — DX: Other abnormal findings in urine: R82.998

## 2022-07-11 HISTORY — DX: Other abnormalities of gait and mobility: R26.89

## 2022-07-11 NOTE — Assessment & Plan Note (Deleted)
Improved of ezetimibe (zetia.)

## 2022-07-11 NOTE — Assessment & Plan Note (Signed)
Continue effexor xr 150 mg daily in am.

## 2022-07-11 NOTE — Assessment & Plan Note (Signed)
Refer to [facility] for balance.

## 2022-07-11 NOTE — Assessment & Plan Note (Addendum)
Hold off on memory medicine.  Patient improved off zetia which was a fairly new medicine.

## 2022-07-11 NOTE — Assessment & Plan Note (Signed)
Continue propranolol for tremor

## 2022-07-11 NOTE — Assessment & Plan Note (Signed)
Checked UA.

## 2022-07-21 ENCOUNTER — Other Ambulatory Visit: Payer: Self-pay | Admitting: Family Medicine

## 2022-07-21 DIAGNOSIS — F331 Major depressive disorder, recurrent, moderate: Secondary | ICD-10-CM

## 2022-07-23 ENCOUNTER — Other Ambulatory Visit: Payer: Self-pay | Admitting: Physician Assistant

## 2022-07-23 ENCOUNTER — Other Ambulatory Visit: Payer: Self-pay

## 2022-07-23 DIAGNOSIS — R251 Tremor, unspecified: Secondary | ICD-10-CM

## 2022-07-23 MED ORDER — VENLAFAXINE HCL ER 150 MG PO CP24: ORAL_CAPSULE | ORAL | 0 refills | Status: DC

## 2022-07-23 MED ORDER — PROPRANOLOL HCL ER 60 MG PO CP24: 60.0000 mg | ORAL_CAPSULE | Freq: Every day | ORAL | 1 refills | Status: DC

## 2022-07-25 DIAGNOSIS — M25552 Pain in left hip: Secondary | ICD-10-CM | POA: Diagnosis not present

## 2022-07-29 ENCOUNTER — Ambulatory Visit: Payer: HMO | Admitting: Family Medicine

## 2022-08-05 ENCOUNTER — Ambulatory Visit: Payer: HMO | Admitting: Podiatry

## 2022-08-05 DIAGNOSIS — G4733 Obstructive sleep apnea (adult) (pediatric): Secondary | ICD-10-CM | POA: Diagnosis not present

## 2022-08-11 DIAGNOSIS — E1065 Type 1 diabetes mellitus with hyperglycemia: Secondary | ICD-10-CM | POA: Diagnosis not present

## 2022-08-11 DIAGNOSIS — E10319 Type 1 diabetes mellitus with unspecified diabetic retinopathy without macular edema: Secondary | ICD-10-CM | POA: Diagnosis not present

## 2022-08-12 ENCOUNTER — Encounter: Payer: Self-pay | Admitting: Family Medicine

## 2022-08-12 ENCOUNTER — Ambulatory Visit (INDEPENDENT_AMBULATORY_CARE_PROVIDER_SITE_OTHER): Payer: HMO | Admitting: Family Medicine

## 2022-08-12 VITALS — BP 140/82 | HR 88 | Temp 97.1°F | Resp 15 | Ht 63.0 in | Wt 157.0 lb

## 2022-08-12 DIAGNOSIS — E039 Hypothyroidism, unspecified: Secondary | ICD-10-CM | POA: Diagnosis not present

## 2022-08-12 DIAGNOSIS — I152 Hypertension secondary to endocrine disorders: Secondary | ICD-10-CM

## 2022-08-12 DIAGNOSIS — R413 Other amnesia: Secondary | ICD-10-CM

## 2022-08-12 DIAGNOSIS — R140 Abdominal distension (gaseous): Secondary | ICD-10-CM | POA: Insufficient documentation

## 2022-08-12 DIAGNOSIS — E1159 Type 2 diabetes mellitus with other circulatory complications: Secondary | ICD-10-CM

## 2022-08-12 DIAGNOSIS — F331 Major depressive disorder, recurrent, moderate: Secondary | ICD-10-CM | POA: Diagnosis not present

## 2022-08-12 HISTORY — DX: Abdominal distension (gaseous): R14.0

## 2022-08-12 NOTE — Progress Notes (Unsigned)
Subjective:  Patient ID: [name], female    DOB: Mar 20, 1951  Age: 71 y.o. MRN: [id number]  Chief Complaint  Patient presents with   Hypertension   Depression   Hypothyroidism   HPI Depression: Patient started Trintellix 5 mg once a day and stopped Effexor since one month ago.  Hypertension: Patient is taking Losartan 50 mg once a day, but is supposed to be taking twice daily, and Propranolol 60 mg once daily was added on 06/24/2022.  Hypothyroidism: TSH was abnormal. Levothyroxine was decreased to 137 mcg daily. She needs to recheck TSH today.  Diabetic gastroparesis: Patient reports she is constipated and bloating. She feels it is related to gastroparesis. Dr. Gabriel Carina, her endocrinologist, has not put her on anything. Usually daily miralax helps constipation. She is having Bms every day, but they are small, hard. Patient is currently on miralax once daily. Patient eats a lot of fiber. She is a vegetarian.  Current Outpatient Medications on File Prior to Visit  Medication Sig Dispense Refill   acyclovir (ZOVIRAX) 800 MG tablet TAKE 1 TABLET (800 MG) BY MOUTH 4 TIMES PER DAY FOR 3-7 DAYS AS NEEDED 30 tablet 2   aspirin EC 81 MG tablet Take 81 mg by mouth 2 (two) times a week.      calcium carbonate (OSCAL) 1500 (600 Ca) MG TABS tablet Take by mouth 2 (two) times daily with a meal.     clopidogrel (PLAVIX) 75 MG tablet Take 1 tablet (75 mg total) by mouth daily. 90 tablet 0   ferrous sulfate 325 (65 FE) MG tablet Take 325 mg by mouth daily with breakfast.     insulin aspart (NOVOLOG) 100 UNIT/ML injection Inject 2-3 Units into the skin 3 (three) times daily before meals. Inject 6 units every am, 6 units at noon and 3 units at bedtime.      insulin degludec (TRESIBA FLEXTOUCH) 100 UNIT/ML FlexTouch Pen Inject 18 Units into the skin daily.     levothyroxine (SYNTHROID) 137 MCG tablet Take 1 tablet (137 mcg total) by mouth daily before breakfast. 90 tablet 0   LORazepam (ATIVAN) 0.5 MG  tablet Take 0.5 mg by mouth daily as needed.     losartan (COZAAR) 50 MG tablet Take 1 tablet (50 mg total) by mouth 2 (two) times daily. (Patient taking differently: Take 50 mg by mouth daily in the afternoon.) 90 tablet 1   omeprazole (PRILOSEC) 40 MG capsule Take 40 mg by mouth daily.     propranolol ER (INDERAL LA) 60 MG 24 hr capsule Take 1 capsule (60 mg total) by mouth daily. 90 capsule 1   rosuvastatin (CRESTOR) 40 MG tablet Take 1 tablet (40 mg total) by mouth daily. 90 tablet 1   Vitamin D, Ergocalciferol, (DRISDOL) 1.25 MG (50000 UNIT) CAPS capsule TAKE 1 CAPSULE BY MOUTH EACH WEEK 12 capsule 3   No current facility-administered medications on file prior to visit.   Past Medical History:  Diagnosis Date   Depression    Diabetes mellitus without complication (HCC)    GERD (gastroesophageal reflux disease)    Hypertension    Hypothyroidism    Stroke ([place])    Type 1 diabetes ([place])    Past Surgical History:  Procedure Laterality Date   APPENDECTOMY     APPLICATION OF WOUND VAC Left 08/03/2019   Procedure: Application Of Wound Vac;  Surgeon: Altamese Sparks, MD;  Location: [facility];  Service: Orthopedics;  Laterality: Left;   CESAREAN SECTION  ESOPHAGOGASTRODUODENOSCOPY (EGD) WITH PROPOFOL N/A 02/02/2016   Procedure: ESOPHAGOGASTRODUODENOSCOPY (EGD) WITH PROPOFOL;  Surgeon: Josefine Class, MD;  Location: [facility] END[facility]COPY;  Service: Endoscopy;  Laterality: N/A;   HAND SURGERY     JOINT REPLACEMENT Bilateral    Knee    ORIF ANKLE FRACTURE Left 08/03/2019   with wound vac applied    ORIF ANKLE FRACTURE Left 08/03/2019   Procedure: OPEN REDUCTION INTERNAL FIXATION (ORIF) ANKLE FRACTURE;  Surgeon: Altamese Ecorse, MD;  Location: [facility];  Service: Orthopedics;  Laterality: Left;   TIBIALIS TENDON TRANSFER / REPAIR      Family History  Problem Relation Age of Onset   Cancer Mother        Colon cancer    Dementia Father    Neuropathy Father    Social History   Socioeconomic  History   Marital status: Single    Spouse name: Not on file   Number of children: 2   Years of education: 77   Highest education level: Not on file  Occupational History   Not on file  Tobacco Use   Smoking status: Never   Smokeless tobacco: Never  Vaping Use   Vaping Use: Never used  Substance and Sexual Activity   Alcohol use: Yes    Alcohol/week: 1.0 standard drink of alcohol    Types: 1 Glasses of wine per week    Comment: everyday   Drug use: Never   Sexual activity: Not Currently  Other Topics Concern   Not on file  Social History Narrative   Right hand   Lives alone   Drinks caffeine   An apartment building   Social Determinants of Health   Financial Resource Strain: Not on file  Food Insecurity: Not on file  Transportation Needs: Not on file  Physical Activity: Not on file  Stress: Not on file  Social Connections: Not on file    Review of Systems  Constitutional:  Negative for chills, fatigue and fever.  HENT:  Negative for congestion, ear pain and sore throat.   Respiratory:  Negative for cough and shortness of breath.   Cardiovascular:  Negative for chest pain and palpitations.  Gastrointestinal:  Positive for constipation. Negative for abdominal pain, diarrhea, nausea and vomiting.  Endocrine: Negative for polydipsia, polyphagia and polyuria.  Genitourinary:  Negative for difficulty urinating and dysuria.  Musculoskeletal:  Positive for arthralgias (left hip pain. shoulder pain.). Negative for back pain and myalgias.       Left foot turns out. Saw Dr. Joie Bimler. He scheduled an mri of hip. Patient has hip pain.  Patient has BL shoulder pain and was told she needed surgery after failed steroid injections. Patient is questioning whether she would benefit from physical therapy.   Skin:  Negative for rash.  Neurological:  Negative for headaches.  Psychiatric/Behavioral:  Negative for dysphoric mood. The patient is not nervous/anxious.      Objective:  BP  (!) 140/82   Pulse 88   Temp (!) 97.1 F (36.2 C)   Resp 15   Ht '5\' 3"'$  (1.6 m)   Wt 157 lb (71.2 kg)   SpO2 96%   BMI 27.81 kg/m      08/12/2022   10:34 AM 08/12/2022    9:41 AM 07/09/2022    4:08 PM  BP/Weight  Systolic BP 017 510 258  Diastolic BP 82 80 70  Wt. (Lbs)  157 159  BMI  27.81 kg/m2 28.17 kg/m2    Physical Exam Vitals reviewed.  Constitutional:      Appearance: Normal appearance. She is normal weight.  Neck:     Vascular: No carotid bruit.  Cardiovascular:     Rate and Rhythm: Normal rate and regular rhythm.     Heart sounds: Normal heart sounds.  Pulmonary:     Effort: Pulmonary effort is normal. No respiratory distress.     Breath sounds: Normal breath sounds.  Abdominal:     General: Abdomen is flat. Bowel sounds are normal.     Palpations: Abdomen is soft.     Tenderness: There is no abdominal tenderness.  Musculoskeletal:     Comments: Left foot turns out.  FROM hips BL.  Neurological:     Mental Status: She is alert and oriented to person, place, and time.  Psychiatric:        Mood and Affect: Mood normal.        Behavior: Behavior normal.     Diabetic Foot Exam - Simple   No data filed      Lab Results  Component Value Date   WBC 4.3 06/24/2022   HGB 12.9 06/24/2022   HCT 38.8 06/24/2022   PLT 309 06/24/2022   GLUCOSE 189 (H) 06/24/2022   CHOL 213 (H) 06/24/2022   TRIG 78 06/24/2022   HDL 118 06/24/2022   LDLCALC 82 06/24/2022   ALT 17 06/24/2022   AST 17 06/24/2022   NA 139 06/24/2022   K 4.1 06/24/2022   CL 101 06/24/2022   CREATININE 0.78 06/24/2022   BUN 14 06/24/2022   CO2 24 06/24/2022   TSH 0.020 (L) 06/24/2022   INR 0.9 08/03/2019   HGBA1C 8.6 (H) 06/24/2022      Assessment & Plan:   Problem List Items Addressed This Visit       Cardiovascular and Mediastinum   Hypertension associated with diabetes ([place])    Increase losartan to 100 mg daily and continue propranolol 60 mg qd.  Continue to work on eating a  healthy diet and exercise.          Relevant Medications   insulin degludec (TRESIBA FLEXTOUCH) 100 UNIT/ML FlexTouch Pen     Endocrine   Acquired hypothyroidism    Previously well controlled Continue Synthroid at current dose  Recheck TSH and adjust Synthroid as indicated          Other   Memory loss   Depression, major, recurrent, moderate (HCC) - Primary   Relevant Medications   LORazepam (ATIVAN) 0.5 MG tablet   Abdominal bloating    Ordered gastric emptying study.      Relevant Orders   NM Gastric Emptying   Orders Placed This Encounter  Procedures   NM Gastric Emptying     Follow-up: Return in about 7 weeks (around 09/30/2022) for chronic fasting.  An After Visit Summary was printed and given to the patient.  Rochel Brome, MD [facility] [phone]

## 2022-08-12 NOTE — Assessment & Plan Note (Signed)
Ordered gastric emptying study.

## 2022-08-12 NOTE — Assessment & Plan Note (Signed)
Previously well controlled Continue Synthroid at current dose  Recheck TSH and adjust Synthroid as indicated   

## 2022-08-12 NOTE — Patient Instructions (Addendum)
For constipation: Increase miralax to one dose twice a day.  Check thyroid.  Order gastric emptying study to assess gastroparesis.  For hypertension: Increase losartan to 100 mg daily. For depression: continue trintillix.

## 2022-08-12 NOTE — Assessment & Plan Note (Addendum)
Increase losartan to 100 mg daily and continue propranolol 60 mg qd.  Continue to work on eating a healthy diet and exercise.

## 2022-08-13 LAB — TSH: TSH: 0.695 u[IU]/mL (ref 0.450–4.500)

## 2022-08-14 ENCOUNTER — Telehealth: Payer: Self-pay | Admitting: Family Medicine

## 2022-08-14 NOTE — Telephone Encounter (Signed)
   [name] has been scheduled for the following appointment:  WHAT: GASTRIC EMPTYING TEST WHERE: [place] [facility] DATE: 08/21/22 TIME: 7:30 AM CHECK-IN  A message has been left for the patient. NOTHING TO EAT, DRINK, OR MEDICATIONS AFTER MIDNIGHT

## 2022-08-19 DIAGNOSIS — M25552 Pain in left hip: Secondary | ICD-10-CM | POA: Diagnosis not present

## 2022-08-19 DIAGNOSIS — S76312A Strain of muscle, fascia and tendon of the posterior muscle group at thigh level, left thigh, initial encounter: Secondary | ICD-10-CM | POA: Diagnosis not present

## 2022-08-19 DIAGNOSIS — M1612 Unilateral primary osteoarthritis, left hip: Secondary | ICD-10-CM | POA: Diagnosis not present

## 2022-08-22 DIAGNOSIS — E119 Type 2 diabetes mellitus without complications: Secondary | ICD-10-CM | POA: Diagnosis not present

## 2022-08-22 DIAGNOSIS — R140 Abdominal distension (gaseous): Secondary | ICD-10-CM | POA: Diagnosis not present

## 2022-08-22 DIAGNOSIS — M1612 Unilateral primary osteoarthritis, left hip: Secondary | ICD-10-CM | POA: Diagnosis not present

## 2022-08-22 DIAGNOSIS — K219 Gastro-esophageal reflux disease without esophagitis: Secondary | ICD-10-CM | POA: Diagnosis not present

## 2022-08-22 DIAGNOSIS — S76012S Strain of muscle, fascia and tendon of left hip, sequela: Secondary | ICD-10-CM | POA: Diagnosis not present

## 2022-08-22 DIAGNOSIS — S76912S Strain of unspecified muscles, fascia and tendons at thigh level, left thigh, sequela: Secondary | ICD-10-CM | POA: Diagnosis not present

## 2022-08-26 ENCOUNTER — Other Ambulatory Visit: Payer: Self-pay

## 2022-08-26 MED ORDER — LOSARTAN POTASSIUM 100 MG PO TABS: 100.0000 mg | ORAL_TABLET | Freq: Every day | ORAL | 0 refills | Status: DC

## 2022-08-26 NOTE — Telephone Encounter (Signed)
Patient called stating that she lost her Rx bottle for the Losartan '50mg'$  1 tablet twice daily and can't find it. So patient is needing a new RX so her insurance will still pay for the medication. So sending new Rx in for the patient for the Losartan 100 mg 1 tablet daily.

## 2022-08-27 ENCOUNTER — Other Ambulatory Visit: Payer: Self-pay | Admitting: Family Medicine

## 2022-09-03 ENCOUNTER — Other Ambulatory Visit: Payer: Self-pay

## 2022-09-03 DIAGNOSIS — R140 Abdominal distension (gaseous): Secondary | ICD-10-CM

## 2022-09-15 ENCOUNTER — Other Ambulatory Visit: Payer: Self-pay | Admitting: Family Medicine

## 2022-09-15 DIAGNOSIS — R413 Other amnesia: Secondary | ICD-10-CM

## 2022-09-19 ENCOUNTER — Other Ambulatory Visit: Payer: Self-pay | Admitting: Family Medicine

## 2022-09-22 ENCOUNTER — Other Ambulatory Visit: Payer: Self-pay | Admitting: Family Medicine

## 2022-10-04 ENCOUNTER — Encounter: Payer: Self-pay | Admitting: Psychology

## 2022-10-04 ENCOUNTER — Encounter: Payer: Self-pay | Admitting: Neurology

## 2022-10-04 DIAGNOSIS — K219 Gastro-esophageal reflux disease without esophagitis: Secondary | ICD-10-CM | POA: Insufficient documentation

## 2022-10-04 NOTE — Telephone Encounter (Signed)
It is 5 mg, thanks

## 2022-10-07 ENCOUNTER — Other Ambulatory Visit: Payer: HMO

## 2022-10-07 ENCOUNTER — Encounter: Payer: HMO | Admitting: Psychology

## 2022-10-07 DIAGNOSIS — Z029 Encounter for administrative examinations, unspecified: Secondary | ICD-10-CM

## 2022-10-08 ENCOUNTER — Encounter: Payer: Self-pay | Admitting: Psychology

## 2022-10-08 ENCOUNTER — Encounter: Payer: HMO | Admitting: Psychology

## 2022-10-09 ENCOUNTER — Other Ambulatory Visit: Payer: Self-pay | Admitting: Family Medicine

## 2022-10-09 DIAGNOSIS — R413 Other amnesia: Secondary | ICD-10-CM

## 2022-10-12 ENCOUNTER — Other Ambulatory Visit: Payer: Self-pay | Admitting: Family Medicine

## 2022-10-13 ENCOUNTER — Other Ambulatory Visit: Payer: Self-pay | Admitting: Family Medicine

## 2022-10-14 ENCOUNTER — Ambulatory Visit: Payer: HMO | Admitting: Family Medicine

## 2022-10-14 ENCOUNTER — Ambulatory Visit (INDEPENDENT_AMBULATORY_CARE_PROVIDER_SITE_OTHER): Payer: HMO | Admitting: Psychology

## 2022-10-14 ENCOUNTER — Encounter: Payer: Self-pay | Admitting: Psychology

## 2022-10-14 DIAGNOSIS — Z029 Encounter for administrative examinations, unspecified: Secondary | ICD-10-CM

## 2022-10-14 DIAGNOSIS — R413 Other amnesia: Secondary | ICD-10-CM

## 2022-10-14 NOTE — Progress Notes (Signed)
   Neuropsychology Note [name]     [name] did not show to her scheduled appointment on 10/07/2022. Out of courtesy, she was offered a new appointment this afternoon. She confirmed this appointment on the afternoon of 10/07/2022 and again on 10/09/2022. She then called and left a voicemail after hours during the Thanksgiving holiday with the intention to cancel as she would be out of town. As such, she did not attend her current appointment. As a less than 24 business hours notice was given, this will be formally marked as her 2nd no show appointment. She will be rescheduled in the earliest available appointment slot.

## 2022-10-15 ENCOUNTER — Encounter: Payer: HMO | Admitting: Psychology

## 2022-10-16 ENCOUNTER — Ambulatory Visit: Payer: HMO | Admitting: Neurology

## 2022-10-17 ENCOUNTER — Ambulatory Visit: Payer: HMO | Admitting: Family Medicine

## 2022-10-18 ENCOUNTER — Encounter: Payer: Self-pay | Admitting: Family Medicine

## 2022-10-18 ENCOUNTER — Ambulatory Visit (INDEPENDENT_AMBULATORY_CARE_PROVIDER_SITE_OTHER): Payer: HMO | Admitting: Family Medicine

## 2022-10-18 VITALS — BP 142/80 | HR 91 | Temp 98.2°F | Resp 16 | Ht 63.0 in | Wt 163.0 lb

## 2022-10-18 DIAGNOSIS — E1159 Type 2 diabetes mellitus with other circulatory complications: Secondary | ICD-10-CM | POA: Diagnosis not present

## 2022-10-18 DIAGNOSIS — I152 Hypertension secondary to endocrine disorders: Secondary | ICD-10-CM

## 2022-10-18 DIAGNOSIS — E039 Hypothyroidism, unspecified: Secondary | ICD-10-CM | POA: Diagnosis not present

## 2022-10-18 DIAGNOSIS — K219 Gastro-esophageal reflux disease without esophagitis: Secondary | ICD-10-CM

## 2022-10-18 DIAGNOSIS — E1065 Type 1 diabetes mellitus with hyperglycemia: Secondary | ICD-10-CM

## 2022-10-18 DIAGNOSIS — G301 Alzheimer's disease with late onset: Secondary | ICD-10-CM

## 2022-10-18 DIAGNOSIS — F02A18 Dementia in other diseases classified elsewhere, mild, with other behavioral disturbance: Secondary | ICD-10-CM

## 2022-10-18 DIAGNOSIS — E782 Mixed hyperlipidemia: Secondary | ICD-10-CM

## 2022-10-18 MED ORDER — DONEPEZIL HCL 10 MG PO TABS: 10.0000 mg | ORAL_TABLET | Freq: Every day | ORAL | 0 refills | Status: DC

## 2022-10-18 MED ORDER — GVOKE HYPOPEN 2-PACK 0.5 MG/0.1ML ~~LOC~~ SOAJ: 0.5000 mg | Freq: Every day | SUBCUTANEOUS | 2 refills | Status: AC

## 2022-10-18 NOTE — Patient Instructions (Signed)
Please check if taking effexor at home and if so what dose, so we can adjust for hot flashes.   Increase aricept to 10 mg before bed.

## 2022-10-18 NOTE — Progress Notes (Addendum)
Subjective:  Patient ID: [name], female    DOB: 1951-05-08  Age: 71 y.o. MRN: [id number]  Chief Complaint  Patient presents with   Diabetes   Hypertension   Hyperlipidemia    HPI Diabetes:  Complications: Hypertension Glucose checking: Patient has Dexcom Glucose logs: 140-150's mg/dl Hypoglycemia: low sugars numerous times in a week.  Most recent A1C: 8.6% Current medications: Tresiba 20 units daily, Novolog slidding scale. Last Eye Exam: 05/17/2022 Foot checks: Weekly  Hypertension: Patient is taking Losartan 100 mg once a day and Propranolol 60 mg once daily, Aspirin 81 mg daily.  Hypothyroidism: Levothyroxine 137 mcg daily.   Dementia: Taking Aricept 5 mg at bedtime. Son says it is helping. Patient is unsure.   GAD: Takes Ativan 0.5 mg daily PRN.  Current Outpatient Medications on File Prior to Visit  Medication Sig Dispense Refill   acyclovir (ZOVIRAX) 800 MG tablet TAKE 1 TABLET (800 MG) BY MOUTH 4 TIMES PER DAY FOR 3-7 DAYS AS NEEDED 30 tablet 2   aspirin EC 81 MG tablet Take 81 mg by mouth 2 (two) times a week.      calcium carbonate (OSCAL) 1500 (600 Ca) MG TABS tablet Take by mouth 2 (two) times daily with a meal.     clopidogrel (PLAVIX) 75 MG tablet Take 1 tablet (75 mg total) by mouth daily. (Patient not taking: Reported on 11/04/2022) 90 tablet 0   ferrous sulfate 325 (65 FE) MG tablet Take 325 mg by mouth daily with breakfast. (Patient not taking: Reported on 11/04/2022)     insulin aspart (NOVOLOG) 100 UNIT/ML injection Inject 2-3 Units into the skin 3 (three) times daily before meals. Inject 6 units every am, 6 units at noon and 3 units at bedtime.      insulin degludec (TRESIBA FLEXTOUCH) 100 UNIT/ML FlexTouch Pen Inject 22 Units into the skin daily.     LORazepam (ATIVAN) 0.5 MG tablet TAKE 1 TABLET BY MOUTH EVERY DAY AS NEEDED FOR ANXIETY 30 tablet 2   omeprazole (PRILOSEC) 40 MG capsule Take 40 mg by mouth daily.     propranolol ER (INDERAL LA)  60 MG 24 hr capsule Take 1 capsule (60 mg total) by mouth daily. 90 capsule 1   rosuvastatin (CRESTOR) 40 MG tablet TAKE 1 TABLET BY MOUTH EVERY DAY 90 tablet 1   No current facility-administered medications on file prior to visit.   Past Medical History:  Diagnosis Date   Abdominal bloating 08/12/2022   Acquired hypothyroidism 12/23/2021   Acute respiratory failure with hypoxemia 04/20/2014   Asthma 08/14/2015   Ataxia 12/23/2021   Capsulitis of metatarsophalangeal (MTP) joint of left foot 01/22/2018   Cough due to ACE inhibitor 09/30/2021   Daytime somnolence 12/23/2021   GERD (gastroesophageal reflux disease)    Hearing loss of left ear due to cerumen impaction 10/25/2021   Hypertension associated with diabetes 04/15/2014   Imbalance 07/11/2022   Lacunar infarction 01/10/2021   right dorsal pons   Left tibial fracture 08/03/2019   Major depressive disorder 01/21/2022   Mixed hyperlipidemia 02/17/2022   Nocturia 09/30/2021   Primary localized osteoarthrosis, lower leg 04/18/2014   Snoring 09/30/2021   Tremor 06/29/2022   Type 1 diabetes mellitus with hyperglycemia 09/30/2021   Urine WBC increased 07/11/2022   Past Surgical History:  Procedure Laterality Date   APPENDECTOMY     APPLICATION OF WOUND VAC Left 08/03/2019   Procedure: Application Of Wound Vac;  Surgeon: Altamese Max, MD;  Location: [facility];  Service: Orthopedics;  Laterality: Left;   CESAREAN SECTION     ESOPHAGOGASTRODUODENOSCOPY (EGD) WITH PROPOFOL N/A 02/02/2016   Procedure: ESOPHAGOGASTRODUODENOSCOPY (EGD) WITH PROPOFOL;  Surgeon: Josefine Class, MD;  [facility] ENDOSCOPY;  Service: Endoscopy;  Laterality: N/A;   HAND SURGERY     JOINT REPLACEMENT Bilateral    Knee    ORIF ANKLE FRACTURE Left 08/03/2019   with wound vac applied    ORIF ANKLE FRACTURE Left 08/03/2019   Procedure: OPEN REDUCTION INTERNAL FIXATION (ORIF) ANKLE FRACTURE;  Surgeon: Altamese Briarcliff, MD;  Location: [facility];  Service:  Orthopedics;  Laterality: Left;   TIBIALIS TENDON TRANSFER / REPAIR      Family History  Problem Relation Age of Onset   Cancer Mother        Colon cancer    Dementia Father    Neuropathy Father    Social History   Socioeconomic History   Marital status: Single    Spouse name: Not on file   Number of children: 2   Years of education: 40   Highest education level: Not on file  Occupational History   Not on file  Tobacco Use   Smoking status: Never   Smokeless tobacco: Never  Vaping Use   Vaping Use: Never used  Substance and Sexual Activity   Alcohol use: Yes    Alcohol/week: 1.0 standard drink of alcohol    Types: 1 Glasses of wine per week    Comment: everyday   Drug use: Never   Sexual activity: Not Currently  Other Topics Concern   Not on file  Social History Narrative   Right hand   Lives alone   Drinks caffeine   An apartment building   Social Determinants of Health   Financial Resource Strain: Not on file  Food Insecurity: Not on file  Transportation Needs: Not on file  Physical Activity: Not on file  Stress: Not on file  Social Connections: Not on file    Review of Systems  Constitutional:  Negative for chills, fatigue and fever.  HENT:  Negative for congestion, ear pain, sinus pain and sore throat.   Respiratory:  Negative for cough and shortness of breath.   Cardiovascular:  Negative for chest pain and palpitations.  Gastrointestinal:  Negative for abdominal pain, constipation, diarrhea, nausea and vomiting.  Endocrine: Negative for polydipsia, polyphagia and polyuria.  Genitourinary:  Negative for difficulty urinating and dysuria.  Musculoskeletal:  Negative for arthralgias, back pain and myalgias.  Skin:  Negative for rash.  Neurological:  Negative for headaches.  Psychiatric/Behavioral:  Negative for dysphoric mood. The patient is not nervous/anxious.      Objective:  BP (!) 142/80   Pulse 91   Temp 98.2 F (36.8 C)   Resp 16   Ht 5'  3" (1.6 m)   Wt 163 lb (73.9 kg)   SpO2 97%   BMI 28.87 kg/m      11/04/2022    9:49 AM 11/04/2022    9:29 AM 10/18/2022   11:10 AM  BP/Weight  Systolic BP 782 956 213  Diastolic BP 72 90 80  Wt. (Lbs)  168 163  BMI  29.76 kg/m2 28.87 kg/m2    Physical Exam Vitals reviewed.  Constitutional:      Appearance: Normal appearance. She is normal weight.  Neck:     Vascular: No carotid bruit.  Cardiovascular:     Rate and Rhythm: Normal rate and regular rhythm.     Heart sounds: Normal  heart sounds.  Pulmonary:     Effort: Pulmonary effort is normal. No respiratory distress.     Breath sounds: Normal breath sounds.  Abdominal:     General: Abdomen is flat. Bowel sounds are normal.     Palpations: Abdomen is soft.     Tenderness: There is no abdominal tenderness.  Neurological:     Mental Status: She is alert and oriented to person, place, and time.  Psychiatric:        Mood and Affect: Mood normal.        Behavior: Behavior normal.    Diabetic Foot Exam - Simple   No data filed      Lab Results  Component Value Date   WBC 5.3 10/31/2022   HGB 12.7 10/31/2022   HCT 38 10/31/2022   PLT 275 10/31/2022   GLUCOSE 189 (H) 06/24/2022   CHOL 213 (H) 06/24/2022   TRIG 78 06/24/2022   HDL 118 06/24/2022   LDLCALC 82 06/24/2022   ALT 33 10/31/2022   AST 39 (A) 10/31/2022   NA 139 06/24/2022   K 4.1 10/31/2022   CL 101 06/24/2022   CREATININE 0.7 10/31/2022   BUN 14 06/24/2022   CO2 24 06/24/2022   TSH 0.695 08/12/2022   INR 0.9 08/03/2019   HGBA1C 8.6 10/31/2022      Assessment & Plan:   Problem List Items Addressed This Visit       Cardiovascular and Mediastinum   Hypertension associated with diabetes - Primary    The current medical regimen is effective;  continue present plan and medications. Recommend continue to work on eating healthy diet and exercise. Continue losartan 100 mg once daily  Continue inderal LA 60 mg daily.       Relevant  Medications   Glucagon (GVOKE HYPOPEN 2-PACK) 0.5 MG/0.1ML SOAJ     Digestive   GERD (gastroesophageal reflux disease)    Continue omeprazole 40 mg daily.         Endocrine   Type 1 diabetes mellitus with hyperglycemia    Management per specialist.  The current medical regimen is effective;  continue present plan and medications. Sent GVOKE.      Relevant Medications   Glucagon (GVOKE HYPOPEN 2-PACK) 0.5 MG/0.1ML SOAJ   Acquired hypothyroidism    The current medical regimen is effective;  continue present plan and medications. Continue levothyroxine 137 mcg once daily.         Nervous and Auditory   DAT (dementia of Alzheimer type) (HCC)    Increase aricept 10 mg before bed.       Relevant Medications   donepezil (ARICEPT) 10 MG tablet     Other   Mixed hyperlipidemia    Well controlled.  No changes to medicines. Continue crestor 40 mg before bed.  Continue to work on eating a healthy diet and exercise.       .  Meds ordered this encounter  Medications   Glucagon (GVOKE HYPOPEN 2-PACK) 0.5 MG/0.1ML SOAJ    Sig: Inject 0.5 mg into the skin daily. As needed for low sugars less than 60 if does not respond oral sugar.    Dispense:  0.2 mL    Refill:  2   donepezil (ARICEPT) 10 MG tablet    Sig: Take 1 tablet (10 mg total) by mouth at bedtime.    Dispense:  90 tablet    Refill:  0    Follow-up: Return in about 3 months (around 01/17/2023) for chronic  fasting.  An After Visit Summary was printed and given to the patient.  Rochel Brome, MD [name] [phone]

## 2022-10-20 DIAGNOSIS — F0280 Dementia in other diseases classified elsewhere without behavioral disturbance: Secondary | ICD-10-CM | POA: Insufficient documentation

## 2022-10-20 DIAGNOSIS — R413 Other amnesia: Secondary | ICD-10-CM | POA: Insufficient documentation

## 2022-10-20 NOTE — Assessment & Plan Note (Signed)
Management per specialist.  The current medical regimen is effective;  continue present plan and medications. Sent GVOKE.

## 2022-10-20 NOTE — Assessment & Plan Note (Signed)
Increase aricept 10 mg before bed.

## 2022-10-20 NOTE — Assessment & Plan Note (Signed)
Well controlled.  No changes to medicines. Continue crestor 40 mg before bed.  Continue to work on eating a healthy diet and exercise.

## 2022-10-20 NOTE — Assessment & Plan Note (Signed)
The current medical regimen is effective;  continue present plan and medications. Continue levothyroxine 137 mcg once daily.

## 2022-10-20 NOTE — Assessment & Plan Note (Signed)
The current medical regimen is effective;  continue present plan and medications. Recommend continue to work on eating healthy diet and exercise. Continue losartan 100 mg once daily  Continue inderal LA 60 mg daily.

## 2022-10-20 NOTE — Assessment & Plan Note (Signed)
Continue omeprazole 40 mg daily

## 2022-10-23 ENCOUNTER — Other Ambulatory Visit: Payer: Self-pay

## 2022-10-25 ENCOUNTER — Other Ambulatory Visit: Payer: Self-pay | Admitting: Family Medicine

## 2022-10-29 ENCOUNTER — Encounter: Payer: HMO | Admitting: Psychology

## 2022-10-31 DIAGNOSIS — Z01818 Encounter for other preprocedural examination: Secondary | ICD-10-CM | POA: Diagnosis not present

## 2022-10-31 LAB — HEPATIC FUNCTION PANEL
ALT: 33 U/L (ref 7–35)
AST: 39 — AB (ref 13–35)

## 2022-10-31 LAB — CBC AND DIFFERENTIAL
HCT: 38 (ref 36–46)
Hemoglobin: 12.7 (ref 12.0–16.0)
Platelets: 275 10*3/uL (ref 150–400)
WBC: 5.3

## 2022-10-31 LAB — BASIC METABOLIC PANEL
Creatinine: 0.7 (ref 0.5–1.1)
Potassium: 4.1 mEq/L (ref 3.5–5.1)

## 2022-10-31 LAB — VITAMIN D 25 HYDROXY (VIT D DEFICIENCY, FRACTURES): Vit D, 25-Hydroxy: 85.9

## 2022-10-31 LAB — HEMOGLOBIN A1C: Hemoglobin A1C: 8.6

## 2022-10-31 LAB — COMPREHENSIVE METABOLIC PANEL: eGFR: 60

## 2022-11-04 ENCOUNTER — Ambulatory Visit (INDEPENDENT_AMBULATORY_CARE_PROVIDER_SITE_OTHER): Payer: PPO | Admitting: Family Medicine

## 2022-11-04 VITALS — BP 134/72 | HR 78 | Temp 96.1°F | Resp 16 | Ht 63.0 in | Wt 168.0 lb

## 2022-11-04 DIAGNOSIS — K644 Residual hemorrhoidal skin tags: Secondary | ICD-10-CM

## 2022-11-04 DIAGNOSIS — H6122 Impacted cerumen, left ear: Secondary | ICD-10-CM

## 2022-11-04 DIAGNOSIS — I152 Hypertension secondary to endocrine disorders: Secondary | ICD-10-CM | POA: Diagnosis not present

## 2022-11-04 DIAGNOSIS — E039 Hypothyroidism, unspecified: Secondary | ICD-10-CM | POA: Diagnosis not present

## 2022-11-04 DIAGNOSIS — E1159 Type 2 diabetes mellitus with other circulatory complications: Secondary | ICD-10-CM

## 2022-11-04 DIAGNOSIS — Z01818 Encounter for other preprocedural examination: Secondary | ICD-10-CM

## 2022-11-04 HISTORY — DX: Residual hemorrhoidal skin tags: K64.4

## 2022-11-04 MED ORDER — LEVOTHYROXINE SODIUM 150 MCG PO TABS: 150.0000 ug | ORAL_TABLET | Freq: Every day | ORAL | 0 refills | Status: DC

## 2022-11-04 MED ORDER — HYDROCORTISONE (PERIANAL) 2.5 % EX CREA: 1.0000 | TOPICAL_CREAM | Freq: Two times a day (BID) | CUTANEOUS | 1 refills | Status: DC

## 2022-11-04 MED ORDER — VALSARTAN 320 MG PO TABS: 320.0000 mg | ORAL_TABLET | Freq: Every day | ORAL | 0 refills | Status: DC

## 2022-11-04 NOTE — Assessment & Plan Note (Signed)
INCREASE LEVOTHYROXINE TO 150 MCG ONCE DAILY IN AM 30 MINUTES PRIOR TO BREAKFAST.  Recheck in 6 weeks

## 2022-11-04 NOTE — Assessment & Plan Note (Signed)
Second bp improved, but will change to stronger medicine valsartan since has had numerous elevated bps recorded in recent appts.  Will need cardioloogy and endocrinology clearance.  Goal is usually to have A1c less than 8 to proceed with surgery.

## 2022-11-04 NOTE — Assessment & Plan Note (Signed)
CHANGE LOSARTAN TO VALSARTAN 320 MG ONCE DAILY.  RECOMMEND PURCHASE A WRIST BP CUFF AND CHECK BP DAILY. PLEASE KEEP A LOG.  KEEP FOLLOW UP APPT WITH ENDOCRINOLOGY.  PER patient anesthesia was referring to cardiology for preoperative cv clearance.

## 2022-11-04 NOTE — Assessment & Plan Note (Signed)
Anusol hc cream twice daily x 7 days. Refill given.

## 2022-11-04 NOTE — Progress Notes (Signed)
Subjective:  Patient ID: [name], female    DOB: August 02, 1951  Age: 71 y.o. MRN: [id number]  Chief Complaint  Patient presents with   Hypertension   HPI:  [name] comes in to day for evaluation of her elevated blood pressure.  She was getting pre-operative work done at [facility] and her bp was elevated to SBP 180/ .  Does not check bp at home.   EKG NSR. No st changes CXR normal.  A1C 8.6 TSH 16+. PER patient taking levothyroxine correctly.  CBC and CMP normal except sugar up.    Patient was drinking 2 full glasses of wine every day. No liquor or beer. Quit 3 days ago. Patient is craving wine. Not shaky or jittery. Her son sent note requesting she get naloxone to help prevent the cravings and possible withdrawal.    [facility] Office Visit from 11/04/2022 in [place]  Alcohol Use Disorder Identification Test Final Score (AUDIT) 6         Current Outpatient Medications on File Prior to Visit  Medication Sig Dispense Refill   acyclovir (ZOVIRAX) 800 MG tablet TAKE 1 TABLET (800 MG) BY MOUTH 4 TIMES PER DAY FOR 3-7 DAYS AS NEEDED 30 tablet 2   aspirin EC 81 MG tablet Take 81 mg by mouth 2 (two) times a week.      calcium carbonate (OSCAL) 1500 (600 Ca) MG TABS tablet Take by mouth 2 (two) times daily with a meal.     donepezil (ARICEPT) 10 MG tablet Take 1 tablet (10 mg total) by mouth at bedtime. 90 tablet 0   Glucagon (GVOKE HYPOPEN 2-PACK) 0.5 MG/0.1ML SOAJ Inject 0.5 mg into the skin daily. As needed for low sugars less than 60 if does not respond oral sugar. 0.2 mL 2   insulin aspart (NOVOLOG) 100 UNIT/ML injection Inject 2-3 Units into the skin 3 (three) times daily before meals. Inject 6 units every am, 6 units at noon and 3 units at bedtime.      insulin degludec (TRESIBA FLEXTOUCH) 100 UNIT/ML FlexTouch Pen Inject 22 Units into the skin daily.     LORazepam (ATIVAN) 0.5 MG tablet TAKE 1 TABLET BY MOUTH EVERY DAY AS NEEDED FOR ANXIETY 30 tablet 2    omeprazole (PRILOSEC) 40 MG capsule Take 40 mg by mouth daily.     propranolol ER (INDERAL LA) 60 MG 24 hr capsule Take 1 capsule (60 mg total) by mouth daily. 90 capsule 1   rosuvastatin (CRESTOR) 40 MG tablet TAKE 1 TABLET BY MOUTH EVERY DAY 90 tablet 1   venlafaxine XR (EFFEXOR-XR) 150 MG 24 hr capsule Take 150 mg by mouth daily with breakfast.     Vitamin D, Ergocalciferol, (DRISDOL) 1.25 MG (50000 UNIT) CAPS capsule TAKE 1 CAPSULE BY MOUTH EACH WEEK 12 capsule 3   clopidogrel (PLAVIX) 75 MG tablet Take 1 tablet (75 mg total) by mouth daily. (Patient not taking: Reported on 11/04/2022) 90 tablet 0   ferrous sulfate 325 (65 FE) MG tablet Take 325 mg by mouth daily with breakfast. (Patient not taking: Reported on 11/04/2022)     No current facility-administered medications on file prior to visit.   Past Medical History:  Diagnosis Date   Abdominal bloating 08/12/2022   Acquired hypothyroidism 12/23/2021   Acute respiratory failure with hypoxemia 04/20/2014   Asthma 08/14/2015   Ataxia 12/23/2021   Capsulitis of metatarsophalangeal (MTP) joint of left foot 01/22/2018   Cough due to ACE inhibitor 09/30/2021  Daytime somnolence 12/23/2021   GERD (gastroesophageal reflux disease)    Hearing loss of left ear due to cerumen impaction 10/25/2021   Hypertension associated with diabetes 04/15/2014   Imbalance 07/11/2022   Lacunar infarction 01/10/2021   right dorsal pons   Left tibial fracture 08/03/2019   Major depressive disorder 01/21/2022   Mixed hyperlipidemia 02/17/2022   Nocturia 09/30/2021   Primary localized osteoarthrosis, lower leg 04/18/2014   Snoring 09/30/2021   Tremor 06/29/2022   Type 1 diabetes mellitus with hyperglycemia 09/30/2021   Urine WBC increased 07/11/2022   Past Surgical History:  Procedure Laterality Date   APPENDECTOMY     APPLICATION OF WOUND VAC Left 08/03/2019   Procedure: Application Of Wound Vac;  Surgeon: Altamese Bluewell, MD;  Location: [facility];   Service: Orthopedics;  Laterality: Left;   CESAREAN SECTION     ESOPHAGOGASTRODUODENOSCOPY (EGD) WITH PROPOFOL N/A 02/02/2016   Procedure: ESOPHAGOGASTRODUODENOSCOPY (EGD) WITH PROPOFOL;  Surgeon: Josefine Class, MD;  Location: [facility] ENDOSCOPY;  Service: Endoscopy;  Laterality: N/A;   HAND SURGERY     JOINT REPLACEMENT Bilateral    Knee    ORIF ANKLE FRACTURE Left 08/03/2019   with wound vac applied    ORIF ANKLE FRACTURE Left 08/03/2019   Procedure: OPEN REDUCTION INTERNAL FIXATION (ORIF) ANKLE FRACTURE;  Surgeon: Altamese Fulshear, MD;  Location: [place];  Service: Orthopedics;  Laterality: Left;   TIBIALIS TENDON TRANSFER / REPAIR      Family History  Problem Relation Age of Onset   Cancer Mother        Colon cancer    Dementia Father    Neuropathy Father    Social History   Socioeconomic History   Marital status: Single    Spouse name: Not on file   Number of children: 2   Years of education: 67   Highest education level: Not on file  Occupational History   Not on file  Tobacco Use   Smoking status: Never   Smokeless tobacco: Never  Vaping Use   Vaping Use: Never used  Substance and Sexual Activity   Alcohol use: Yes    Alcohol/week: 1.0 standard drink of alcohol    Types: 1 Glasses of wine per week    Comment: everyday   Drug use: Never   Sexual activity: Not Currently  Other Topics Concern   Not on file  Social History Narrative   Right hand   Lives alone   Drinks caffeine   An apartment building   Social Determinants of Health   Financial Resource Strain: Not on file  Food Insecurity: Not on file  Transportation Needs: Not on file  Physical Activity: Not on file  Stress: Not on file  Social Connections: Not on file    Review of Systems  Constitutional:  Positive for fatigue. Negative for chills and fever.  HENT:  Negative for congestion, rhinorrhea and sore throat.   Respiratory:  Negative for cough and shortness of breath.   Cardiovascular:   Negative for chest pain.  Gastrointestinal:  Negative for abdominal pain, constipation, diarrhea, nausea and vomiting.       Complaining of external hemorrhoid x 3 days. Using prep h which usually works, but has not helped yet.  It is painful and itchy.   Genitourinary:  Negative for dysuria and urgency.  Musculoskeletal:  Negative for back pain and myalgias.  Neurological:  Positive for headaches. Negative for dizziness and weakness.  Psychiatric/Behavioral:  Positive for confusion. Negative for dysphoric mood.  The patient is not nervous/anxious.      Objective:  BP 134/72   Pulse 78   Temp (!) 96.1 F (35.6 C)   Resp 16   Ht '5\' 3"'$  (1.6 m)   Wt 168 lb (76.2 kg)   BMI 29.76 kg/m      11/04/2022    9:49 AM 11/04/2022    9:29 AM 10/18/2022   11:10 AM  BP/Weight  Systolic BP 440 102 725  Diastolic BP 72 90 80  Wt. (Lbs)  168 163  BMI  29.76 kg/m2 28.87 kg/m2    Physical Exam Vitals reviewed.  Constitutional:      Appearance: Normal appearance.  HENT:     Right Ear: There is no impacted cerumen.     Left Ear: There is impacted cerumen.  Neck:     Vascular: No carotid bruit.  Cardiovascular:     Rate and Rhythm: Normal rate and regular rhythm.     Heart sounds: Normal heart sounds.  Pulmonary:     Effort: Pulmonary effort is normal.     Breath sounds: Normal breath sounds.  Abdominal:     Palpations: Abdomen is soft.     Tenderness: There is no abdominal tenderness.  Neurological:     Mental Status: She is alert and oriented to person, place, and time.  Psychiatric:        Mood and Affect: Mood normal.        Behavior: Behavior normal.     Diabetic Foot Exam - Simple   No data filed      Lab Results  Component Value Date   WBC 5.3 10/31/2022   HGB 12.7 10/31/2022   HCT 38 10/31/2022   PLT 275 10/31/2022   GLUCOSE 189 (H) 06/24/2022   CHOL 213 (H) 06/24/2022   TRIG 78 06/24/2022   HDL 118 06/24/2022   LDLCALC 82 06/24/2022   ALT 33 10/31/2022   AST  39 (A) 10/31/2022   NA 139 06/24/2022   K 4.1 10/31/2022   CL 101 06/24/2022   CREATININE 0.7 10/31/2022   BUN 14 06/24/2022   CO2 24 06/24/2022   TSH 0.695 08/12/2022   INR 0.9 08/03/2019   HGBA1C 8.6 10/31/2022      Assessment & Plan:   Problem List Items Addressed This Visit       Cardiovascular and Mediastinum   Hypertension associated with diabetes - Primary    CHANGE LOSARTAN TO VALSARTAN 320 MG ONCE DAILY.  RECOMMEND PURCHASE A WRIST BP CUFF AND CHECK BP DAILY. PLEASE KEEP A LOG.  KEEP FOLLOW UP APPT WITH ENDOCRINOLOGY.  PER patient anesthesia was referring to cardiology for preoperative cv clearance.       Relevant Medications   valsartan (DIOVAN) 320 MG tablet   External hemorrhoid    Anusol hc cream twice daily x 7 days. Refill given.       Relevant Medications   valsartan (DIOVAN) 320 MG tablet   hydrocortisone (ANUSOL-HC) 2.5 % rectal cream     Endocrine   Acquired hypothyroidism    INCREASE LEVOTHYROXINE TO 150 MCG ONCE DAILY IN AM 30 MINUTES PRIOR TO BREAKFAST.  Recheck in 6 weeks      Relevant Medications   levothyroxine (SYNTHROID) 150 MCG tablet     Nervous and Auditory   Hearing loss of left ear due to cerumen impaction    Irrigation and instrumental cerumen removal successful. Small anterior trauma to ear canal.  Other   Preoperative clearance    Second bp improved, but will change to stronger medicine valsartan since has had numerous elevated bps recorded in recent appts.  Will need cardioloogy and endocrinology clearance.  Goal is usually to have A1c less than 8 to proceed with surgery.       .  Meds ordered this encounter  Medications   valsartan (DIOVAN) 320 MG tablet    Sig: Take 1 tablet (320 mg total) by mouth daily.    Dispense:  90 tablet    Refill:  0   levothyroxine (SYNTHROID) 150 MCG tablet    Sig: Take 1 tablet (150 mcg total) by mouth daily.    Dispense:  90 tablet    Refill:  0   hydrocortisone  (ANUSOL-HC) 2.5 % rectal cream    Sig: Place 1 Application rectally 2 (two) times daily.    Dispense:  30 g    Refill:  1   Total time spent on today's visit was greater than 30 minutes, including both face-to-face time and nonface-to-face time personally spent on review of chart (labs and imaging), discussing labs and goals, discussing further work-up, treatment options, referrals to specialist if needed, reviewing outside records of pertinent, answering patient's questions, and coordinating care.   Follow-up: Return in about 6 weeks (around 12/16/2022) for chronic follow up.  An After Visit Summary was printed and given to the patient.  Rochel Brome, MD [facility] [phone]

## 2022-11-04 NOTE — Assessment & Plan Note (Signed)
Irrigation and instrumental cerumen removal successful. Small anterior trauma to ear canal.

## 2022-11-04 NOTE — Patient Instructions (Signed)
CHANGE LOSARTAN TO VALSARTAN 320 MG ONCE DAILY.  INCREASE LEVOTHYROXINE TO 150 MCG ONCE DAILY IN AM 30 MINUTES PRIOR TO BREAKFAST.  RECOMMEND PURCHASE A WRIST BP CUFF AND CHECK BP DAILY. PLEASE KEEP A LOG.

## 2022-11-05 ENCOUNTER — Encounter: Payer: HMO | Admitting: Psychology

## 2022-11-06 ENCOUNTER — Encounter: Payer: Self-pay | Admitting: Family Medicine

## 2022-11-09 ENCOUNTER — Other Ambulatory Visit: Payer: Self-pay | Admitting: Physician Assistant

## 2022-11-11 ENCOUNTER — Encounter: Payer: Self-pay | Admitting: Family Medicine

## 2022-11-15 ENCOUNTER — Other Ambulatory Visit: Payer: Self-pay | Admitting: Family Medicine

## 2022-11-20 DIAGNOSIS — R739 Hyperglycemia, unspecified: Secondary | ICD-10-CM | POA: Diagnosis not present

## 2022-11-20 DIAGNOSIS — M25539 Pain in unspecified wrist: Secondary | ICD-10-CM | POA: Diagnosis not present

## 2022-11-20 DIAGNOSIS — S62001A Unspecified fracture of navicular [scaphoid] bone of right wrist, initial encounter for closed fracture: Secondary | ICD-10-CM | POA: Diagnosis not present

## 2022-11-20 DIAGNOSIS — W19XXXA Unspecified fall, initial encounter: Secondary | ICD-10-CM | POA: Diagnosis not present

## 2022-11-20 DIAGNOSIS — E7800 Pure hypercholesterolemia, unspecified: Secondary | ICD-10-CM | POA: Diagnosis not present

## 2022-11-20 DIAGNOSIS — S52571A Other intraarticular fracture of lower end of right radius, initial encounter for closed fracture: Secondary | ICD-10-CM | POA: Diagnosis not present

## 2022-11-20 DIAGNOSIS — S52614A Nondisplaced fracture of right ulna styloid process, initial encounter for closed fracture: Secondary | ICD-10-CM | POA: Diagnosis not present

## 2022-11-21 ENCOUNTER — Other Ambulatory Visit: Payer: Self-pay | Admitting: Family Medicine

## 2022-11-21 ENCOUNTER — Telehealth: Payer: Self-pay

## 2022-11-21 DIAGNOSIS — S52571A Other intraarticular fracture of lower end of right radius, initial encounter for closed fracture: Secondary | ICD-10-CM | POA: Diagnosis not present

## 2022-11-21 NOTE — Telephone Encounter (Signed)
[name] called to report that she has had a recent fall and has a fracture of her wrist.  Dr. Samule Dry is evaluation this.  She had questions about her elevated liver function.  She is scheduled to be seen the last week in January for follow-up.  Will call us back if her surgery with her shoulder interferes with her follow-up date.

## 2022-11-25 ENCOUNTER — Telehealth: Payer: Self-pay

## 2022-11-25 NOTE — Telephone Encounter (Signed)
     Patient  visit on 11/20/2022  at [facility] was for fall, fracture of wrist.  Have you been able to follow up with your primary care physician? Patient followed up with her Orthopedic Surgeon PA.  The patient was or was not able to obtain any needed medicine or equipment. Patient obtained medication.  Are there diet recommendations that you are having difficulty following? No  Patient expresses understanding of discharge instructions and education provided has no other needs at this time.    [facility] Care Guide   [email or web address]  ?? [phone]   Website: [email or web address]  [email or web address]

## 2022-11-26 DIAGNOSIS — F339 Major depressive disorder, recurrent, unspecified: Secondary | ICD-10-CM | POA: Diagnosis not present

## 2022-11-26 DIAGNOSIS — E039 Hypothyroidism, unspecified: Secondary | ICD-10-CM | POA: Diagnosis not present

## 2022-11-26 DIAGNOSIS — Z6829 Body mass index (BMI) 29.0-29.9, adult: Secondary | ICD-10-CM | POA: Diagnosis not present

## 2022-11-26 DIAGNOSIS — S5291XD Unspecified fracture of right forearm, subsequent encounter for closed fracture with routine healing: Secondary | ICD-10-CM | POA: Diagnosis not present

## 2022-11-26 DIAGNOSIS — E119 Type 2 diabetes mellitus without complications: Secondary | ICD-10-CM | POA: Diagnosis not present

## 2022-11-26 DIAGNOSIS — Z794 Long term (current) use of insulin: Secondary | ICD-10-CM | POA: Diagnosis not present

## 2022-11-26 DIAGNOSIS — I10 Essential (primary) hypertension: Secondary | ICD-10-CM | POA: Diagnosis not present

## 2022-11-26 DIAGNOSIS — Z79890 Hormone replacement therapy (postmenopausal): Secondary | ICD-10-CM | POA: Diagnosis not present

## 2022-11-26 DIAGNOSIS — Z9181 History of falling: Secondary | ICD-10-CM | POA: Diagnosis not present

## 2022-11-28 DIAGNOSIS — S52571A Other intraarticular fracture of lower end of right radius, initial encounter for closed fracture: Secondary | ICD-10-CM | POA: Diagnosis not present

## 2022-12-02 ENCOUNTER — Other Ambulatory Visit: Payer: Self-pay | Admitting: Family Medicine

## 2022-12-04 ENCOUNTER — Telehealth: Payer: Self-pay

## 2022-12-04 NOTE — Telephone Encounter (Signed)
Patient called and stated that she is having surgery and due to her pain, she has not been able to sleep at night and wanted to know if she could take something to help her sleep.  Spoke with Hoyle Sauer, RN and she stated patient could take her lorazepam 0.5 mg before bed like normal but recommend her to call office that is doing her surgery to see if she can take anything else due to her being NPO after midnight.

## 2022-12-11 DIAGNOSIS — E10319 Type 1 diabetes mellitus with unspecified diabetic retinopathy without macular edema: Secondary | ICD-10-CM | POA: Diagnosis not present

## 2022-12-11 DIAGNOSIS — Z7982 Long term (current) use of aspirin: Secondary | ICD-10-CM | POA: Diagnosis not present

## 2022-12-11 DIAGNOSIS — G8918 Other acute postprocedural pain: Secondary | ICD-10-CM | POA: Diagnosis not present

## 2022-12-11 DIAGNOSIS — E1065 Type 1 diabetes mellitus with hyperglycemia: Secondary | ICD-10-CM | POA: Diagnosis not present

## 2022-12-11 DIAGNOSIS — Z794 Long term (current) use of insulin: Secondary | ICD-10-CM | POA: Diagnosis not present

## 2022-12-11 DIAGNOSIS — E109 Type 1 diabetes mellitus without complications: Secondary | ICD-10-CM | POA: Diagnosis not present

## 2022-12-11 DIAGNOSIS — S52571A Other intraarticular fracture of lower end of right radius, initial encounter for closed fracture: Secondary | ICD-10-CM | POA: Diagnosis not present

## 2022-12-11 DIAGNOSIS — Z79899 Other long term (current) drug therapy: Secondary | ICD-10-CM | POA: Diagnosis not present

## 2022-12-11 DIAGNOSIS — M810 Age-related osteoporosis without current pathological fracture: Secondary | ICD-10-CM | POA: Diagnosis not present

## 2022-12-11 DIAGNOSIS — I10 Essential (primary) hypertension: Secondary | ICD-10-CM | POA: Diagnosis not present

## 2022-12-16 ENCOUNTER — Ambulatory Visit (INDEPENDENT_AMBULATORY_CARE_PROVIDER_SITE_OTHER): Payer: PPO | Admitting: Family Medicine

## 2022-12-16 ENCOUNTER — Encounter: Payer: Self-pay | Admitting: Family Medicine

## 2022-12-16 VITALS — BP 138/82 | HR 72 | Temp 97.3°F | Ht 63.0 in | Wt 163.0 lb

## 2022-12-16 DIAGNOSIS — I10 Essential (primary) hypertension: Secondary | ICD-10-CM

## 2022-12-16 DIAGNOSIS — I152 Hypertension secondary to endocrine disorders: Secondary | ICD-10-CM

## 2022-12-16 DIAGNOSIS — F0280 Dementia in other diseases classified elsewhere without behavioral disturbance: Secondary | ICD-10-CM | POA: Diagnosis not present

## 2022-12-16 DIAGNOSIS — E039 Hypothyroidism, unspecified: Secondary | ICD-10-CM | POA: Diagnosis not present

## 2022-12-16 DIAGNOSIS — E1065 Type 1 diabetes mellitus with hyperglycemia: Secondary | ICD-10-CM | POA: Diagnosis not present

## 2022-12-16 DIAGNOSIS — I1A0 Resistant hypertension: Secondary | ICD-10-CM

## 2022-12-16 DIAGNOSIS — E782 Mixed hyperlipidemia: Secondary | ICD-10-CM

## 2022-12-16 DIAGNOSIS — K219 Gastro-esophageal reflux disease without esophagitis: Secondary | ICD-10-CM | POA: Diagnosis not present

## 2022-12-16 DIAGNOSIS — G309 Alzheimer's disease, unspecified: Secondary | ICD-10-CM

## 2022-12-16 DIAGNOSIS — F02A18 Dementia in other diseases classified elsewhere, mild, with other behavioral disturbance: Secondary | ICD-10-CM

## 2022-12-16 MED ORDER — AMLODIPINE BESYLATE 5 MG PO TABS: 5.0000 mg | ORAL_TABLET | Freq: Every day | ORAL | 0 refills | Status: DC

## 2022-12-16 MED ORDER — MEMANTINE HCL 5 MG PO TABS: 5.0000 mg | ORAL_TABLET | Freq: Two times a day (BID) | ORAL | 2 refills | Status: DC

## 2022-12-16 MED ORDER — AMLODIPINE BESYLATE 2.5 MG PO TABS: 2.5000 mg | ORAL_TABLET | Freq: Every day | ORAL | 0 refills | Status: DC

## 2022-12-16 NOTE — Patient Instructions (Addendum)
Hypertension: Add amlodipine 2.5 mg daily.  Continue other medications. ANTICIPATE CARDIOLOGY MAKING ADJUSTMENTS IF NEEDED.   Hypothyroidism: Check what dose of Synthroid (levothyroxine) you are taking at home.  Dementia: Started on Namenda 5 mg twice daily.  Continue Aricept.  SURGERY CLEARANCE: Referred to cardiology for preop clearance for shoulder surgery.  Request clearance from her endocrinologist for shoulder surgery.

## 2022-12-16 NOTE — Assessment & Plan Note (Signed)
Continue omeprazole 40 mg daily

## 2022-12-16 NOTE — Assessment & Plan Note (Signed)
Well controlled.  No changes to medicines. Continue crestor 40 mg before bed.  Continue to work on eating a healthy diet and exercise.

## 2022-12-16 NOTE — Assessment & Plan Note (Addendum)
Management per specialist.  Recheck A1c.  Request clearance from endocrinology.  Sent GVOKE.

## 2022-12-16 NOTE — Progress Notes (Signed)
Subjective:  Patient ID: [name], female    DOB: [birth year]  Age: 72 y.o. MRN: [id number]  Chief Complaint  Patient presents with   Diabetes   Hypertension    HPI Welda comes in today for hypertension.  She was getting pre-operative work done at [facility] and her bp was elevated to SBP 180/ . At her last visit, I changed losartan to valsartan 320 mg once daily, propranolol ER 60 mg once daily,   Recommended patient check bp daily and was returning today for hopefully my clearance. In the meantime the patient fell and had surgery for right broken wrist. Has been having high readings still with her blood pressure. Patient thought she was supposed to get a cardiology referral from anesthesiology at the hospital.   Recent preop work up for upcoming shoulder surgery below. EKG NSR. No st changes CXR normal.  A1C 8.6 (goal for operative clearance is less than 8) I had recommended changes to insulin, but patient did not change these because she was going to see her endocrinology.  I had planned to recheck A1C in 6 weeks to hopefully clear her for her surgery.  TSH 16+.(I increased levothyroxine to 150 mcg once daily in am.)  CBC and CMP normal except sugar up.    Patient has discontinued her alcohol intake one month ago.         12/16/2022   10:11 AM 12/16/2022   10:10 AM 08/12/2022   10:38 AM 01/31/2022    3:48 PM 01/21/2022   12:58 PM  Depression screen PHQ 2/9  Decreased Interest 0 0 0 0 1  Down, Depressed, Hopeless 0 0 0 0 1  PHQ - 2 Score 0 0 0 0 2  Altered sleeping 0  '2 1 1  '$ Tired, decreased energy 0  0 1 1  Change in appetite 0  3 1 0  Feeling bad or failure about yourself  0  0 0 0  Trouble concentrating 0  0 0 2  Moving slowly or fidgety/restless 0  0 0 0  Suicidal thoughts 0  0 0 0  PHQ-9 Score 0  '5 3 6  '$ Difficult doing work/chores Not difficult at all  Not difficult at all Not difficult at all Somewhat difficult         11/28/2021    1:41 PM 11/28/2021     1:42 PM 02/13/2022    2:08 PM 06/20/2022    1:09 PM 12/16/2022   10:10 AM  Fall Risk  Falls in the past year? 0 1 0 1 1  Was there an injury with Fall? 0 1 0 0 1  Fall Risk Category Calculator 0 3 0 2 2  Fall Risk Category (Retired) Low High Low Moderate   (RETIRED) Patient Fall Risk Level Low fall risk Moderate fall risk Low fall risk Moderate fall risk   Patient at Risk for Falls Due to No Fall Risks History of fall(s)   Impaired balance/gait  Fall risk Follow up Falls evaluation completed Falls evaluation completed   Falls evaluation completed      Review of Systems  Constitutional:  Negative for appetite change, fatigue and fever.  HENT:  Negative for congestion, ear pain, sinus pressure and sore throat.   Respiratory:  Negative for cough, chest tightness, shortness of breath and wheezing.   Cardiovascular:  Negative for chest pain and palpitations.  Gastrointestinal:  Negative for abdominal pain, constipation, diarrhea, nausea and vomiting.  Genitourinary:  Negative for dysuria and  hematuria.  Musculoskeletal:  Negative for arthralgias, back pain, joint swelling and myalgias.  Skin:  Negative for rash.  Neurological:  Negative for dizziness, weakness and headaches.  Psychiatric/Behavioral:  Negative for dysphoric mood. The patient is not nervous/anxious.     Current Outpatient Medications on File Prior to Visit  Medication Sig Dispense Refill   acyclovir (ZOVIRAX) 800 MG tablet TAKE 1 TABLET (800 MG) BY MOUTH 4 TIMES PER DAY FOR 3-7 DAYS AS NEEDED 30 tablet 2   aspirin EC 81 MG tablet Take 81 mg by mouth 2 (two) times a week.      calcium carbonate (OSCAL) 1500 (600 Ca) MG TABS tablet Take by mouth 2 (two) times daily with a meal.     donepezil (ARICEPT) 10 MG tablet Take 1 tablet (10 mg total) by mouth at bedtime. 90 tablet 0   ferrous sulfate 325 (65 FE) MG tablet Take 325 mg by mouth daily with breakfast.     Glucagon (GVOKE HYPOPEN 2-PACK) 0.5 MG/0.1ML SOAJ Inject 0.5 mg into  the skin daily. As needed for low sugars less than 60 if does not respond oral sugar. 0.2 mL 2   hydrocortisone (ANUSOL-HC) 2.5 % rectal cream Place 1 Application rectally 2 (two) times daily. 30 g 1   insulin aspart (NOVOLOG) 100 UNIT/ML injection Inject 2-3 Units into the skin 3 (three) times daily before meals. Inject 6 units every am, 6 units at noon and 3 units at bedtime.      insulin degludec (TRESIBA FLEXTOUCH) 100 UNIT/ML FlexTouch Pen Inject 19 Units into the skin daily.     levothyroxine (SYNTHROID) 137 MCG tablet TAKE 1 TABLET BY MOUTH DAILY BEFORE BREAKFAST. 90 tablet 1   LORazepam (ATIVAN) 0.5 MG tablet TAKE 1 TABLET BY MOUTH EVERY DAY AS NEEDED FOR ANXIETY 30 tablet 1   omeprazole (PRILOSEC) 40 MG capsule TAKE 1 CAPSULE BY MOUTH EVERY DAY 90 capsule 0   propranolol ER (INDERAL LA) 60 MG 24 hr capsule Take 1 capsule (60 mg total) by mouth daily. 90 capsule 1   rosuvastatin (CRESTOR) 40 MG tablet TAKE 1 TABLET BY MOUTH EVERY DAY 90 tablet 1   valsartan (DIOVAN) 320 MG tablet Take 1 tablet (320 mg total) by mouth daily. 90 tablet 0   venlafaxine XR (EFFEXOR-XR) 150 MG 24 hr capsule TAKE 1 CAPSULE BY MOUTH EVERY DAY 90 capsule 1   Vitamin D, Ergocalciferol, (DRISDOL) 1.25 MG (50000 UNIT) CAPS capsule TAKE 1 CAPSULE BY MOUTH EACH WEEK 12 capsule 3   clopidogrel (PLAVIX) 75 MG tablet Take 1 tablet (75 mg total) by mouth daily. (Patient not taking: Reported on 11/04/2022) 90 tablet 0   No current facility-administered medications on file prior to visit.   Past Medical History:  Diagnosis Date   Abdominal bloating 08/12/2022   Acquired hypothyroidism 12/23/2021   Acute respiratory failure with hypoxemia 04/20/2014   Asthma 08/14/2015   Ataxia 12/23/2021   Capsulitis of metatarsophalangeal (MTP) joint of left foot 01/22/2018   Cough due to ACE inhibitor 09/30/2021   Daytime somnolence 12/23/2021   GERD (gastroesophageal reflux disease)    Hearing loss of left ear due to cerumen  impaction 10/25/2021   Hypertension associated with diabetes 04/15/2014   Imbalance 07/11/2022   Lacunar infarction 01/10/2021   right dorsal pons   Left tibial fracture 08/03/2019   Major depressive disorder 01/21/2022   Mixed hyperlipidemia 02/17/2022   Nocturia 09/30/2021   Primary localized osteoarthrosis, lower leg 04/18/2014   Snoring 09/30/2021  Tremor 06/29/2022   Type 1 diabetes mellitus with hyperglycemia 09/30/2021   Urine WBC increased 07/11/2022   Past Surgical History:  Procedure Laterality Date   APPENDECTOMY     APPLICATION OF WOUND VAC Left 08/03/2019   Procedure: Application Of Wound Vac;  Surgeon: Altamese Gagetown, MD;  Location: [place];  Service: Orthopedics;  Laterality: Left;   CESAREAN SECTION     ESOPHAGOGASTRODUODENOSCOPY (EGD) WITH PROPOFOL N/A 02/02/2016   Procedure: ESOPHAGOGASTRODUODENOSCOPY (EGD) WITH PROPOFOL;  Surgeon: Josefine Class, MD;  Location: [facility] ENDOSCOPY;  Service: Endoscopy;  Laterality: N/A;   HAND SURGERY     JOINT REPLACEMENT Bilateral    Knee    ORIF ANKLE FRACTURE Left 08/03/2019   with wound vac applied    ORIF ANKLE FRACTURE Left 08/03/2019   Procedure: OPEN REDUCTION INTERNAL FIXATION (ORIF) ANKLE FRACTURE;  Surgeon: Altamese Kanorado, MD;  Location: [place];  Service: Orthopedics;  Laterality: Left;   TIBIALIS TENDON TRANSFER / REPAIR      Family History  Problem Relation Age of Onset   Cancer Mother        Colon cancer    Dementia Father    Neuropathy Father    Social History   Socioeconomic History   Marital status: Single    Spouse name: Not on file   Number of children: 2   Years of education: 66   Highest education level: Not on file  Occupational History   Not on file  Tobacco Use   Smoking status: Never   Smokeless tobacco: Never  Vaping Use   Vaping Use: Never used  Substance and Sexual Activity   Alcohol use: Yes    Alcohol/week: 1.0 standard drink of alcohol    Types: 1 Glasses of wine per week     Comment: everyday   Drug use: Never   Sexual activity: Not Currently  Other Topics Concern   Not on file  Social History Narrative   Right hand   Lives alone   Drinks caffeine   An apartment building   Social Determinants of Health   Financial Resource Strain: Not on file  Food Insecurity: Not on file  Transportation Needs: Not on file  Physical Activity: Not on file  Stress: Not on file  Social Connections: Not on file    Objective:  BP 138/82   Pulse 72   Temp (!) 97.3 F (36.3 C) (Temporal)   Ht '5\' 3"'$  (1.6 m)   Wt 163 lb (73.9 kg)   SpO2 98%   BMI 28.87 kg/m      12/16/2022   10:48 AM 12/16/2022   10:11 AM 11/04/2022    9:49 AM  BP/Weight  Systolic BP 518 841 660  Diastolic BP 82 92 72  Wt. (Lbs)  163   BMI  28.87 kg/m2     Physical Exam Vitals reviewed.  Constitutional:      Appearance: Normal appearance. She is normal weight.  Cardiovascular:     Rate and Rhythm: Normal rate and regular rhythm.     Pulses: Normal pulses.     Heart sounds: Normal heart sounds.  Pulmonary:     Effort: Pulmonary effort is normal.     Breath sounds: Normal breath sounds.  Abdominal:     General: Abdomen is flat. Bowel sounds are normal.     Palpations: Abdomen is soft.  Musculoskeletal:     Comments: Right wrist in cast.  Neurological:     Mental Status: She is alert and  oriented to person, place, and time.  Psychiatric:        Mood and Affect: Mood normal.        Behavior: Behavior normal.     Diabetic Foot Exam - Simple   No data filed      Lab Results  Component Value Date   WBC 5.3 10/31/2022   HGB 12.7 10/31/2022   HCT 38 10/31/2022   PLT 275 10/31/2022   GLUCOSE 189 (H) 06/24/2022   CHOL 213 (H) 06/24/2022   TRIG 78 06/24/2022   HDL 118 06/24/2022   LDLCALC 82 06/24/2022   ALT 33 10/31/2022   AST 39 (A) 10/31/2022   NA 139 06/24/2022   K 4.1 10/31/2022   CL 101 06/24/2022   CREATININE 0.7 10/31/2022   BUN 14 06/24/2022   CO2 24 06/24/2022    TSH 0.208 (L) 12/16/2022   INR 0.9 08/03/2019   HGBA1C 8.4 (H) 12/16/2022      Assessment & Plan:    Acquired hypothyroidism Assessment & Plan: Check what dose of Synthroid (levothyroxine) you are taking at home. Let us know.   Orders: -     T4, free -     TSH  Type 1 diabetes mellitus with hyperglycemia Assessment & Plan: Management per specialist.  Recheck A1c.  Request clearance from endocrinology.  Sent GVOKE.  Orders: -     Hemoglobin A1c -     Ambulatory referral to Endocrinology  Gastroesophageal reflux disease without esophagitis Assessment & Plan: Continue omeprazole 40 mg daily.    Mixed hyperlipidemia Assessment & Plan: Well controlled.  No changes to medicines. Continue crestor 40 mg before bed.  Continue to work on eating a healthy diet and exercise.     Dementia due to Alzheimer's disease [facility]) Assessment & Plan: Started on Namenda 5 mg twice daily.  Continue Aricept.  Orders: -     Memantine HCl; Take 1 tablet (5 mg total) by mouth 2 (two) times daily.  Dispense: 60 tablet; Refill: 2  Resistant hypertension Assessment & Plan: Not at goal Continue valsartan 320 mg daily.  Add amlodipine 2.5 mg daily.  Refer to cardiology .  Orders: -     Ambulatory referral to Cardiology -     amLODIPine Besylate; Take 1 tablet (2.5 mg total) by mouth daily.  Dispense: 30 tablet; Refill: 0     Meds ordered this encounter  Medications   DISCONTD: amLODipine (NORVASC) 5 MG tablet    Sig: Take 1 tablet (5 mg total) by mouth daily.    Dispense:  90 tablet    Refill:  0   amLODipine (NORVASC) 2.5 MG tablet    Sig: Take 1 tablet (2.5 mg total) by mouth daily.    Dispense:  30 tablet    Refill:  0    PLEASE CANCEL PREVIOUS PRESCRIPTION SENT FOR AMLODIPINE 5 MG DAILY.   memantine (NAMENDA) 5 MG tablet    Sig: Take 1 tablet (5 mg total) by mouth 2 (two) times daily.    Dispense:  60 tablet    Refill:  2    Orders Placed This Encounter  Procedures    T4, free   TSH   Hemoglobin A1c   Ambulatory referral to Cardiology   Ambulatory referral to Endocrinology     Follow-up: Return in about 3 months (around 03/17/2023) for chronic fasting.  An After Visit Summary was printed and given to the patient.  Total time spent on today's visit was greater than 30  minutes, including both face-to-face time and nonface-to-face time personally spent on review of chart (labs and imaging), discussing labs and goals, discussing further work-up, treatment options, referrals to specialist if needed, reviewing outside records of pertinent, answering patient's questions, and coordinating care.   I,Lauren M Auman,acting as a scribe for Rochel Brome, MD.,have documented all relevant documentation on the behalf of Rochel Brome, MD,as directed by  Rochel Brome, MD while in the presence of Rochel Brome, MD.    Rochel Brome, MD [place] [phone]

## 2022-12-17 LAB — HEMOGLOBIN A1C
Est. average glucose Bld gHb Est-mCnc: 194 mg/dL
Hgb A1c MFr Bld: 8.4 % — ABNORMAL HIGH (ref 4.8–5.6)

## 2022-12-17 LAB — TSH: TSH: 0.208 u[IU]/mL — ABNORMAL LOW (ref 0.450–4.500)

## 2022-12-17 LAB — T4, FREE: Free T4: 1.97 ng/dL — ABNORMAL HIGH (ref 0.82–1.77)

## 2022-12-17 NOTE — Progress Notes (Signed)
Thyroid function abnormal. Recommend decrease dose of levothyroxine 125 mcg once daily in am. Recheck tsh/Free T4 in 6 weeks.  HBA1C: 8.4. Not at goal. Request endocrinology clearance for shoulder surgery.

## 2022-12-19 DIAGNOSIS — M25611 Stiffness of right shoulder, not elsewhere classified: Secondary | ICD-10-CM | POA: Diagnosis not present

## 2022-12-19 DIAGNOSIS — M25512 Pain in left shoulder: Secondary | ICD-10-CM | POA: Diagnosis not present

## 2022-12-19 DIAGNOSIS — M25612 Stiffness of left shoulder, not elsewhere classified: Secondary | ICD-10-CM | POA: Diagnosis not present

## 2022-12-19 DIAGNOSIS — M25511 Pain in right shoulder: Secondary | ICD-10-CM | POA: Diagnosis not present

## 2022-12-21 DIAGNOSIS — E1165 Type 2 diabetes mellitus with hyperglycemia: Secondary | ICD-10-CM | POA: Insufficient documentation

## 2022-12-21 NOTE — Assessment & Plan Note (Signed)
Check what dose of Synthroid (levothyroxine) you are taking at home. Let us know.

## 2022-12-21 NOTE — Assessment & Plan Note (Deleted)
Recheck A1c.  Request clearance from endocrinology.

## 2022-12-21 NOTE — Assessment & Plan Note (Addendum)
Not at goal Continue valsartan 320 mg daily.  Add amlodipine 2.5 mg daily.  Refer to cardiology .

## 2022-12-21 NOTE — Assessment & Plan Note (Signed)
Started on Namenda 5 mg twice daily.  Continue Aricept.

## 2022-12-24 ENCOUNTER — Other Ambulatory Visit: Payer: Self-pay

## 2022-12-24 DIAGNOSIS — S52571A Other intraarticular fracture of lower end of right radius, initial encounter for closed fracture: Secondary | ICD-10-CM | POA: Diagnosis not present

## 2022-12-24 DIAGNOSIS — E039 Hypothyroidism, unspecified: Secondary | ICD-10-CM

## 2022-12-24 MED ORDER — LEVOTHYROXINE SODIUM 125 MCG PO TABS: 125.0000 ug | ORAL_TABLET | Freq: Every day | ORAL | 1 refills | Status: DC

## 2022-12-26 ENCOUNTER — Telehealth: Payer: Self-pay

## 2022-12-26 DIAGNOSIS — M25611 Stiffness of right shoulder, not elsewhere classified: Secondary | ICD-10-CM | POA: Diagnosis not present

## 2022-12-26 DIAGNOSIS — M25511 Pain in right shoulder: Secondary | ICD-10-CM | POA: Diagnosis not present

## 2022-12-26 DIAGNOSIS — M25612 Stiffness of left shoulder, not elsewhere classified: Secondary | ICD-10-CM | POA: Diagnosis not present

## 2022-12-26 DIAGNOSIS — M25512 Pain in left shoulder: Secondary | ICD-10-CM | POA: Diagnosis not present

## 2022-12-26 NOTE — Progress Notes (Addendum)
Cardiology Office Note:    Date:  12/27/2022   ID:  [name], DOB 1951-08-01, MRN [id number]  PCP:  Rochel Brome, MD  Cardiologist:  Shirlee More, MD   Referring MD: Rochel Brome, MD  ASSESSMENT:    1. Preoperative cardiovascular examination   2. Essential hypertension   3. Mixed hyperlipidemia    PLAN:    In order of problems listed above:  I assume she is here because her EKG showed the same pattern when she was at [facility] and had surgery wrist fracture.  There is no clinical correlate myocardial infarction which is type I diabetic and need for this elective procedure we will go ahead and do an echocardiogram.  Unless there is marked abnormality I would proceed with her elective shoulder surgery. Hypertension is improved continue current treatment including calcium channel blocker and ARB Continue her high intensity statin Labile type 1 diabetes managed with her PCP  echocardiogram shows normal left ventricular function no pattern of infarction normal ejection fraction proceed with her planned orthopedic surgery  Next appointment as needed I will plan to see in the office again as needed   Medication Adjustments/Labs and Tests Ordered: Current medicines are reviewed at length with the patient today.  Concerns regarding medicines are outlined above.  No orders of the defined types were placed in this encounter.  No orders of the defined types were placed in this encounter.    Chief Complaint  Patient presents with   Pre-op Exam  She is having shoulder arthroplasty  History of Present Illness:    [name] is a 72 y.o. female with a history of type 1 diabetes hyperlipidemia and hypertension. She had surgery at [facility] 12/11/2022 with open reduction internal fixation of the right distal radius fracture.  She s being seen today for Preoperative cardiology evaluation At the request of Cox, Kirsten, MD. she was seen with her PCP 12/16/2022 with  elevated blood pressure at [facility] systolic in the range of [id number].  Chart relates that she has upcoming shoulder surgery but not date and surgeon.  She has seen Dr. Dorian Furnace orthopedic surgeon and is pending surgery for shoulder He has no known history of heart disease congenital rheumatic murmur or atrial fibrillation Until her fall and fracture of her wrist she was quite active she would do 45 minutes of walking biking and yoga day and was an avid hiker Exercise tolerance is well-preserved Her blood pressure is improved Her revised cardiovascular risk index is 1 class II low risk Her surgery is elective intermediate risk Her EKG shows a pattern of possible inferior apical MI and with her type 1 diabetes she requires a preoperative echocardiogram which we will do in my office in send addendum to this note  Past Medical History:  Diagnosis Date   Abdominal bloating 08/12/2022   Acquired hypothyroidism 12/23/2021   Acute respiratory failure with hypoxemia 04/20/2014   Asthma 08/14/2015   Ataxia 12/23/2021   Capsulitis of metatarsophalangeal (MTP) joint of left foot 01/22/2018   Cough due to ACE inhibitor 09/30/2021   Daytime somnolence 12/23/2021   GERD (gastroesophageal reflux disease)    Hearing loss of left ear due to cerumen impaction 10/25/2021   Hypertension associated with diabetes 04/15/2014   Imbalance 07/11/2022   Lacunar infarction 01/10/2021   right dorsal pons   Left tibial fracture 08/03/2019   Major depressive disorder 01/21/2022   Mixed hyperlipidemia 02/17/2022   Nocturia 09/30/2021   Primary localized osteoarthrosis, lower leg  04/18/2014   Snoring 09/30/2021   Tremor 06/29/2022   Type 1 diabetes mellitus with hyperglycemia 09/30/2021   Urine WBC increased 07/11/2022    Past Surgical History:  Procedure Laterality Date   APPENDECTOMY     APPLICATION OF WOUND VAC Left 08/03/2019   Procedure: Application Of Wound Vac;  Surgeon: Altamese Shepherd, MD;   Location: [place];  Service: Orthopedics;  Laterality: Left;   CESAREAN SECTION     ESOPHAGOGASTRODUODENOSCOPY (EGD) WITH PROPOFOL N/A 02/02/2016   Procedure: ESOPHAGOGASTRODUODENOSCOPY (EGD) WITH PROPOFOL;  Surgeon: Josefine Class, MD;  Location: [facility] ENDOSCOPY;  Service: Endoscopy;  Laterality: N/A;   HAND SURGERY     JOINT REPLACEMENT Bilateral    Knee    ORIF ANKLE FRACTURE Left 08/03/2019   with wound vac applied    ORIF ANKLE FRACTURE Left 08/03/2019   Procedure: OPEN REDUCTION INTERNAL FIXATION (ORIF) ANKLE FRACTURE;  Surgeon: Altamese Brentford, MD;  Location: [place];  Service: Orthopedics;  Laterality: Left;   TIBIALIS TENDON TRANSFER / REPAIR      Current Medications: Current Meds  Medication Sig   acyclovir (ZOVIRAX) 800 MG tablet TAKE 1 TABLET (800 MG) BY MOUTH 4 TIMES PER DAY FOR 3-7 DAYS AS NEEDED   amLODipine (NORVASC) 2.5 MG tablet Take 1 tablet (2.5 mg total) by mouth daily.   aspirin EC 81 MG tablet Take 81 mg by mouth 2 (two) times a week.    calcium carbonate (OSCAL) 1500 (600 Ca) MG TABS tablet Take by mouth 2 (two) times daily with a meal.   clopidogrel (PLAVIX) 75 MG tablet Take 1 tablet (75 mg total) by mouth daily.   donepezil (ARICEPT) 10 MG tablet Take 1 tablet (10 mg total) by mouth at bedtime.   ferrous sulfate 325 (65 FE) MG tablet Take 325 mg by mouth daily with breakfast.   Glucagon (GVOKE HYPOPEN 2-PACK) 0.5 MG/0.1ML SOAJ Inject 0.5 mg into the skin daily. As needed for low sugars less than 60 if does not respond oral sugar.   hydrocortisone (ANUSOL-HC) 2.5 % rectal cream Place 1 Application rectally 2 (two) times daily.   insulin aspart (NOVOLOG) 100 UNIT/ML injection Inject 2-3 Units into the skin 3 (three) times daily before meals. Inject 6 units every am, 6 units at noon and 3 units at bedtime.    insulin degludec (TRESIBA FLEXTOUCH) 100 UNIT/ML FlexTouch Pen Inject 19 Units into the skin daily.   levothyroxine (SYNTHROID) 125 MCG tablet Take 1 tablet (125  mcg total) by mouth daily before breakfast.   LORazepam (ATIVAN) 0.5 MG tablet TAKE 1 TABLET BY MOUTH EVERY DAY AS NEEDED FOR ANXIETY   memantine (NAMENDA) 5 MG tablet Take 1 tablet (5 mg total) by mouth 2 (two) times daily.   omeprazole (PRILOSEC) 40 MG capsule TAKE 1 CAPSULE BY MOUTH EVERY DAY   propranolol ER (INDERAL LA) 60 MG 24 hr capsule Take 1 capsule (60 mg total) by mouth daily.   rosuvastatin (CRESTOR) 40 MG tablet TAKE 1 TABLET BY MOUTH EVERY DAY   valsartan (DIOVAN) 320 MG tablet Take 1 tablet (320 mg total) by mouth daily.   venlafaxine XR (EFFEXOR-XR) 150 MG 24 hr capsule TAKE 1 CAPSULE BY MOUTH EVERY DAY   Vitamin D, Ergocalciferol, (DRISDOL) 1.25 MG (50000 UNIT) CAPS capsule TAKE 1 CAPSULE BY MOUTH EACH WEEK     Allergies:   Lisinopril and Zetia [ezetimibe]   Social History   Socioeconomic History   Marital status: Single    Spouse name: Not  on file   Number of children: 2   Years of education: 16   Highest education level: Not on file  Occupational History   Not on file  Tobacco Use   Smoking status: Never   Smokeless tobacco: Never  Vaping Use   Vaping Use: Never used  Substance and Sexual Activity   Alcohol use: Yes    Alcohol/week: 1.0 standard drink of alcohol    Types: 1 Glasses of wine per week    Comment: everyday   Drug use: Never   Sexual activity: Not Currently  Other Topics Concern   Not on file  Social History Narrative   Right hand   Lives alone   Drinks caffeine   An apartment building   Social Determinants of Health   Financial Resource Strain: Not on file  Food Insecurity: Not on file  Transportation Needs: Not on file  Physical Activity: Not on file  Stress: Not on file  Social Connections: Not on file     Family History: The patient's family history includes Cancer in her mother; Dementia in her father; Neuropathy in her father.  ROS:   ROS Please see the history of present illness.     All other systems reviewed and are  negative.  EKGs/Labs/Other Studies Reviewed:    The following studies were reviewed today:   EKG:  EKG is  ordered today.  The ekg ordered today is personally reviewed and demonstrates sinus rhythm and a pattern of possible inferior apical MI  Recent Labs: 06/24/2022: BUN 14; Sodium 139 10/31/2022: ALT 33; Creatinine 0.7; Hemoglobin 12.7; Platelets 275; Potassium 4.1 12/16/2022: TSH 0.208  Recent Lipid Panel    Component Value Date/Time   CHOL 213 (H) 06/24/2022 1102   TRIG 78 06/24/2022 1102   HDL 118 06/24/2022 1102   CHOLHDL 1.8 06/24/2022 1102   LDLCALC 82 06/24/2022 1102    Physical Exam:    VS:  BP (!) 148/80 (BP Location: Right Arm, Patient Position: Sitting)   Pulse 81   Ht 5' 3"$  (1.6 m)   Wt 159 lb 12.8 oz (72.5 kg)   SpO2 98%   BMI 28.31 kg/m     Wt Readings from Last 3 Encounters:  12/27/22 159 lb 12.8 oz (72.5 kg)  12/16/22 163 lb (73.9 kg)  11/04/22 168 lb (76.2 kg)     GEN:  Well nourished, well developed in no acute distress HEENT: Normal NECK: No JVD; No carotid bruits LYMPHATICS: No lymphadenopathy CARDIAC: RRR, no murmurs, rubs, gallops RESPIRATORY:  Clear to auscultation without rales, wheezing or rhonchi  ABDOMEN: Soft, non-tender, non-distended MUSCULOSKELETAL:  No edema; No deformity  SKIN: Warm and dry NEUROLOGIC:  Alert and oriented x 3 PSYCHIATRIC:  Normal affect   Seen with Jerl Santos CMA chaperone    Signed, Shirlee More, MD  12/27/2022 10:11 AM    [place]

## 2022-12-26 NOTE — Telephone Encounter (Signed)
Patient called with some worries about medication: son was questioning why she was on 4 blood pressure medication and two memory medications.  Read office visit and explain to the son that she should only be taking the valsartan 320 mg, propranolol 60 mg, and amlodipine 2.5 mg due to her Blood pressure not at goal. Explain to him that doctor cox added namenda 5 mg and stated for patient to continue her Aricept 10 mg.

## 2022-12-27 ENCOUNTER — Encounter: Payer: Self-pay | Admitting: Cardiology

## 2022-12-27 ENCOUNTER — Ambulatory Visit: Payer: PPO | Attending: Cardiology | Admitting: Cardiology

## 2022-12-27 VITALS — BP 148/80 | HR 81 | Ht 63.0 in | Wt 159.8 lb

## 2022-12-27 DIAGNOSIS — E782 Mixed hyperlipidemia: Secondary | ICD-10-CM

## 2022-12-27 DIAGNOSIS — R9431 Abnormal electrocardiogram [ECG] [EKG]: Secondary | ICD-10-CM | POA: Diagnosis not present

## 2022-12-27 DIAGNOSIS — Z01810 Encounter for preprocedural cardiovascular examination: Secondary | ICD-10-CM

## 2022-12-27 DIAGNOSIS — I10 Essential (primary) hypertension: Secondary | ICD-10-CM

## 2022-12-27 NOTE — Patient Instructions (Addendum)
Medication Instructions:  Your physician recommends that you continue on your current medications as directed. Please refer to the Current Medication list given to you today.  *If you need a refill on your cardiac medications before your next appointment, please call your pharmacy*   Lab Work:NONE Testing/Procedures: Your physician has requested that you have an echocardiogram. Echocardiography is a painless test that uses sound waves to create images of your heart. It provides your doctor with information about the size and shape of your heart and how well your heart's chambers and valves are working. This procedure takes approximately one hour. There are no restrictions for this procedure. Please do NOT wear cologne, perfume, aftershave, or lotions (deodorant is allowed). Please arrive 15 minutes prior to your appointment time.   Follow-Up: At [facility], you and your health needs are our priority.  As part of our continuing mission to provide you with exceptional heart care, we have created designated Provider Care Teams.  These Care Teams include your primary Cardiologist (physician) and Advanced Practice Providers (APPs -  Physician Assistants and Nurse Practitioners) who all work together to provide you with the care you need, when you need it.  We recommend signing up for the patient portal called "MyChart".  Sign up information is provided on this After Visit Summary.  MyChart is used to connect with patients for Virtual Visits (Telemedicine).  Patients are able to view lab/test results, encounter notes, upcoming appointments, etc.  Non-urgent messages can be sent to your provider as well.   To learn more about what you can do with MyChart, go to [email or web address].    Your next appointment: Follow up as needed      Provider:   Shirlee More, MD    Other Instructions This visit was accompanied by Jerl Santos.

## 2022-12-27 NOTE — Addendum Note (Signed)
Addended by: Edwyna Shell I on: 12/27/2022 10:36 AM   Modules accepted: Orders

## 2023-01-03 ENCOUNTER — Ambulatory Visit: Payer: PPO | Attending: Cardiology

## 2023-01-03 DIAGNOSIS — Z01810 Encounter for preprocedural cardiovascular examination: Secondary | ICD-10-CM | POA: Diagnosis not present

## 2023-01-03 DIAGNOSIS — R9431 Abnormal electrocardiogram [ECG] [EKG]: Secondary | ICD-10-CM

## 2023-01-03 DIAGNOSIS — I10 Essential (primary) hypertension: Secondary | ICD-10-CM | POA: Diagnosis not present

## 2023-01-03 DIAGNOSIS — E782 Mixed hyperlipidemia: Secondary | ICD-10-CM

## 2023-01-03 LAB — ECHOCARDIOGRAM COMPLETE
AR max vel: 1.19 cm2
AV Area VTI: 1.37 cm2
AV Area mean vel: 1.13 cm2
AV Mean grad: 6 mmHg
AV Peak grad: 9.5 mmHg
Ao pk vel: 1.54 m/s
Area-P 1/2: 3.85 cm2
S' Lateral: 2.4 cm

## 2023-01-07 DIAGNOSIS — M25512 Pain in left shoulder: Secondary | ICD-10-CM | POA: Diagnosis not present

## 2023-01-07 DIAGNOSIS — M25612 Stiffness of left shoulder, not elsewhere classified: Secondary | ICD-10-CM | POA: Diagnosis not present

## 2023-01-07 DIAGNOSIS — M25511 Pain in right shoulder: Secondary | ICD-10-CM | POA: Diagnosis not present

## 2023-01-07 DIAGNOSIS — M25611 Stiffness of right shoulder, not elsewhere classified: Secondary | ICD-10-CM | POA: Diagnosis not present

## 2023-01-11 ENCOUNTER — Other Ambulatory Visit: Payer: Self-pay | Admitting: Family Medicine

## 2023-01-11 DIAGNOSIS — R413 Other amnesia: Secondary | ICD-10-CM

## 2023-01-11 DIAGNOSIS — E10319 Type 1 diabetes mellitus with unspecified diabetic retinopathy without macular edema: Secondary | ICD-10-CM | POA: Diagnosis not present

## 2023-01-11 DIAGNOSIS — E1065 Type 1 diabetes mellitus with hyperglycemia: Secondary | ICD-10-CM | POA: Diagnosis not present

## 2023-01-13 ENCOUNTER — Other Ambulatory Visit: Payer: Self-pay | Admitting: Family Medicine

## 2023-01-13 DIAGNOSIS — F02A18 Dementia in other diseases classified elsewhere, mild, with other behavioral disturbance: Secondary | ICD-10-CM

## 2023-01-13 MED ORDER — DONEPEZIL HCL 10 MG PO TABS: 10.0000 mg | ORAL_TABLET | Freq: Every day | ORAL | 1 refills | Status: DC

## 2023-01-16 ENCOUNTER — Other Ambulatory Visit: Payer: Self-pay | Admitting: Family Medicine

## 2023-01-16 DIAGNOSIS — F0280 Dementia in other diseases classified elsewhere without behavioral disturbance: Secondary | ICD-10-CM

## 2023-01-17 ENCOUNTER — Other Ambulatory Visit: Payer: Self-pay | Admitting: Family Medicine

## 2023-01-17 DIAGNOSIS — I1A0 Resistant hypertension: Secondary | ICD-10-CM

## 2023-01-21 ENCOUNTER — Telehealth: Payer: Self-pay

## 2023-01-21 DIAGNOSIS — S52571A Other intraarticular fracture of lower end of right radius, initial encounter for closed fracture: Secondary | ICD-10-CM | POA: Diagnosis not present

## 2023-01-21 NOTE — Telephone Encounter (Signed)
   Pre-operative Risk Assessment    Patient Name: [name]  DOB: Dec 12, 1950 MRN: [id number]      Request for Surgical Clearance    Procedure:   Right Total Shoulder Replacement  Date of Surgery:  Clearance TBD                                 Surgeon:  Joya Salm, MD Surgeon's Group or Practice Name:  [facility] Phone number:  [phone] Fax number:  [phone]   Type of Clearance Requested:   - Medical    Type of Anesthesia:  General    Additional requests/questions:    [name]   01/21/2023, 4:32 PM

## 2023-01-21 NOTE — Telephone Encounter (Signed)
     Primary Cardiologist: Shirlee More, MD  Chart reviewed as part of pre-operative protocol coverage. Given past medical history and time since last visit, based on ACC/AHA guidelines, [name] would be at acceptable risk for the planned procedure without further cardiovascular testing.   Her RCRI is a class III risk, 6.6% risk of major cardiac event.  I will route this recommendation to the requesting party via Epic fax function and remove from pre-op pool.  Please call with questions.  Jossie Ng. Cydne Grahn NP-C     01/21/2023, 4:55 PM [facility] [address] Office [phone] Fax [phone]

## 2023-01-23 ENCOUNTER — Encounter: Payer: Self-pay | Admitting: Physician Assistant

## 2023-01-23 NOTE — Telephone Encounter (Signed)
I saw patient for memory changes. Even mild cognitive impairment can be due to neurodegenerative disease. Last time I saw her I recommended to continue donepezil 10 mg daily, monitor mood as per PCP recommending  psychotherapy  She had an appt with  Dr. Delice Lesch, the patient had several appointments and she cancelled. She was to have Neuropsych testing for clarity of diagnosis and cancelled.   Recommended that she go over all her medications with her primary care physician, because I am not sure she was taking them correctly. She needs a neuropsychological testing for proper diagnosis. Last score however was 30/30. It is ok for Family practice to manage the memory meds, I leave  to Dr. Tobie Poet discretion what meds to give her. . Thanks

## 2023-01-28 DIAGNOSIS — M25612 Stiffness of left shoulder, not elsewhere classified: Secondary | ICD-10-CM | POA: Diagnosis not present

## 2023-01-28 DIAGNOSIS — M25511 Pain in right shoulder: Secondary | ICD-10-CM | POA: Diagnosis not present

## 2023-01-28 DIAGNOSIS — M25512 Pain in left shoulder: Secondary | ICD-10-CM | POA: Diagnosis not present

## 2023-01-28 DIAGNOSIS — M79609 Pain in unspecified limb: Secondary | ICD-10-CM | POA: Diagnosis not present

## 2023-01-28 DIAGNOSIS — R52 Pain, unspecified: Secondary | ICD-10-CM | POA: Diagnosis not present

## 2023-01-28 DIAGNOSIS — Z79899 Other long term (current) drug therapy: Secondary | ICD-10-CM | POA: Diagnosis not present

## 2023-01-28 DIAGNOSIS — M25611 Stiffness of right shoulder, not elsewhere classified: Secondary | ICD-10-CM | POA: Diagnosis not present

## 2023-01-30 ENCOUNTER — Other Ambulatory Visit: Payer: Self-pay | Admitting: Family Medicine

## 2023-01-30 DIAGNOSIS — H4322 Crystalline deposits in vitreous body, left eye: Secondary | ICD-10-CM | POA: Diagnosis not present

## 2023-01-30 DIAGNOSIS — E103553 Type 1 diabetes mellitus with stable proliferative diabetic retinopathy, bilateral: Secondary | ICD-10-CM | POA: Diagnosis not present

## 2023-01-30 DIAGNOSIS — E1159 Type 2 diabetes mellitus with other circulatory complications: Secondary | ICD-10-CM

## 2023-01-30 DIAGNOSIS — B0233 Zoster keratitis: Secondary | ICD-10-CM | POA: Diagnosis not present

## 2023-01-30 LAB — HM DIABETES EYE EXAM

## 2023-01-31 ENCOUNTER — Ambulatory Visit: Payer: HMO | Admitting: Podiatry

## 2023-02-03 ENCOUNTER — Other Ambulatory Visit: Payer: Self-pay | Admitting: Family Medicine

## 2023-02-03 DIAGNOSIS — F331 Major depressive disorder, recurrent, moderate: Secondary | ICD-10-CM

## 2023-02-03 DIAGNOSIS — R251 Tremor, unspecified: Secondary | ICD-10-CM

## 2023-02-03 DIAGNOSIS — M25641 Stiffness of right hand, not elsewhere classified: Secondary | ICD-10-CM | POA: Diagnosis not present

## 2023-02-03 DIAGNOSIS — E039 Hypothyroidism, unspecified: Secondary | ICD-10-CM

## 2023-02-03 DIAGNOSIS — M25631 Stiffness of right wrist, not elsewhere classified: Secondary | ICD-10-CM | POA: Diagnosis not present

## 2023-02-03 DIAGNOSIS — R351 Nocturia: Secondary | ICD-10-CM

## 2023-02-03 DIAGNOSIS — M25531 Pain in right wrist: Secondary | ICD-10-CM | POA: Diagnosis not present

## 2023-02-03 DIAGNOSIS — R413 Other amnesia: Secondary | ICD-10-CM

## 2023-02-03 DIAGNOSIS — M25431 Effusion, right wrist: Secondary | ICD-10-CM | POA: Diagnosis not present

## 2023-02-04 ENCOUNTER — Other Ambulatory Visit: Payer: Self-pay

## 2023-02-04 DIAGNOSIS — M25612 Stiffness of left shoulder, not elsewhere classified: Secondary | ICD-10-CM | POA: Diagnosis not present

## 2023-02-04 DIAGNOSIS — M25512 Pain in left shoulder: Secondary | ICD-10-CM | POA: Diagnosis not present

## 2023-02-04 DIAGNOSIS — M25611 Stiffness of right shoulder, not elsewhere classified: Secondary | ICD-10-CM | POA: Diagnosis not present

## 2023-02-04 DIAGNOSIS — E039 Hypothyroidism, unspecified: Secondary | ICD-10-CM

## 2023-02-04 DIAGNOSIS — M25511 Pain in right shoulder: Secondary | ICD-10-CM | POA: Diagnosis not present

## 2023-02-05 ENCOUNTER — Other Ambulatory Visit: Payer: PPO

## 2023-02-05 ENCOUNTER — Other Ambulatory Visit: Payer: Self-pay | Admitting: Family Medicine

## 2023-02-05 DIAGNOSIS — E10319 Type 1 diabetes mellitus with unspecified diabetic retinopathy without macular edema: Secondary | ICD-10-CM | POA: Diagnosis not present

## 2023-02-05 DIAGNOSIS — E039 Hypothyroidism, unspecified: Secondary | ICD-10-CM | POA: Diagnosis not present

## 2023-02-06 LAB — T4, FREE: Free T4: 1.79 ng/dL — ABNORMAL HIGH (ref 0.82–1.77)

## 2023-02-06 LAB — TSH: TSH: 1.45 u[IU]/mL (ref 0.450–4.500)

## 2023-02-07 DIAGNOSIS — M25631 Stiffness of right wrist, not elsewhere classified: Secondary | ICD-10-CM | POA: Diagnosis not present

## 2023-02-07 DIAGNOSIS — M25641 Stiffness of right hand, not elsewhere classified: Secondary | ICD-10-CM | POA: Diagnosis not present

## 2023-02-07 DIAGNOSIS — M25431 Effusion, right wrist: Secondary | ICD-10-CM | POA: Diagnosis not present

## 2023-02-07 DIAGNOSIS — M25531 Pain in right wrist: Secondary | ICD-10-CM | POA: Diagnosis not present

## 2023-02-09 DIAGNOSIS — E10319 Type 1 diabetes mellitus with unspecified diabetic retinopathy without macular edema: Secondary | ICD-10-CM | POA: Diagnosis not present

## 2023-02-09 DIAGNOSIS — E1065 Type 1 diabetes mellitus with hyperglycemia: Secondary | ICD-10-CM | POA: Diagnosis not present

## 2023-02-11 DIAGNOSIS — H4322 Crystalline deposits in vitreous body, left eye: Secondary | ICD-10-CM | POA: Diagnosis not present

## 2023-02-14 ENCOUNTER — Other Ambulatory Visit: Payer: Self-pay | Admitting: Family Medicine

## 2023-02-14 DIAGNOSIS — F331 Major depressive disorder, recurrent, moderate: Secondary | ICD-10-CM

## 2023-02-14 DIAGNOSIS — E1159 Type 2 diabetes mellitus with other circulatory complications: Secondary | ICD-10-CM

## 2023-02-18 DIAGNOSIS — M25631 Stiffness of right wrist, not elsewhere classified: Secondary | ICD-10-CM | POA: Diagnosis not present

## 2023-02-18 DIAGNOSIS — M25531 Pain in right wrist: Secondary | ICD-10-CM | POA: Diagnosis not present

## 2023-02-18 DIAGNOSIS — M25431 Effusion, right wrist: Secondary | ICD-10-CM | POA: Diagnosis not present

## 2023-02-18 DIAGNOSIS — M25641 Stiffness of right hand, not elsewhere classified: Secondary | ICD-10-CM | POA: Diagnosis not present

## 2023-02-20 DIAGNOSIS — E103553 Type 1 diabetes mellitus with stable proliferative diabetic retinopathy, bilateral: Secondary | ICD-10-CM | POA: Diagnosis not present

## 2023-02-20 DIAGNOSIS — H4322 Crystalline deposits in vitreous body, left eye: Secondary | ICD-10-CM | POA: Diagnosis not present

## 2023-02-21 DIAGNOSIS — M25631 Stiffness of right wrist, not elsewhere classified: Secondary | ICD-10-CM | POA: Diagnosis not present

## 2023-02-21 DIAGNOSIS — M25431 Effusion, right wrist: Secondary | ICD-10-CM | POA: Diagnosis not present

## 2023-02-21 DIAGNOSIS — M25641 Stiffness of right hand, not elsewhere classified: Secondary | ICD-10-CM | POA: Diagnosis not present

## 2023-02-21 DIAGNOSIS — M25531 Pain in right wrist: Secondary | ICD-10-CM | POA: Diagnosis not present

## 2023-02-24 DIAGNOSIS — G8929 Other chronic pain: Secondary | ICD-10-CM | POA: Diagnosis not present

## 2023-02-24 DIAGNOSIS — M25512 Pain in left shoulder: Secondary | ICD-10-CM | POA: Diagnosis not present

## 2023-02-24 DIAGNOSIS — M19012 Primary osteoarthritis, left shoulder: Secondary | ICD-10-CM | POA: Diagnosis not present

## 2023-02-25 DIAGNOSIS — M25511 Pain in right shoulder: Secondary | ICD-10-CM | POA: Diagnosis not present

## 2023-02-25 DIAGNOSIS — M25431 Effusion, right wrist: Secondary | ICD-10-CM | POA: Diagnosis not present

## 2023-02-25 DIAGNOSIS — M25531 Pain in right wrist: Secondary | ICD-10-CM | POA: Diagnosis not present

## 2023-02-25 DIAGNOSIS — M25612 Stiffness of left shoulder, not elsewhere classified: Secondary | ICD-10-CM | POA: Diagnosis not present

## 2023-02-25 DIAGNOSIS — M25512 Pain in left shoulder: Secondary | ICD-10-CM | POA: Diagnosis not present

## 2023-02-25 DIAGNOSIS — M25641 Stiffness of right hand, not elsewhere classified: Secondary | ICD-10-CM | POA: Diagnosis not present

## 2023-02-25 DIAGNOSIS — M25631 Stiffness of right wrist, not elsewhere classified: Secondary | ICD-10-CM | POA: Diagnosis not present

## 2023-02-25 DIAGNOSIS — M25611 Stiffness of right shoulder, not elsewhere classified: Secondary | ICD-10-CM | POA: Diagnosis not present

## 2023-02-27 DIAGNOSIS — M25641 Stiffness of right hand, not elsewhere classified: Secondary | ICD-10-CM | POA: Diagnosis not present

## 2023-02-27 DIAGNOSIS — M25631 Stiffness of right wrist, not elsewhere classified: Secondary | ICD-10-CM | POA: Diagnosis not present

## 2023-02-27 DIAGNOSIS — M25431 Effusion, right wrist: Secondary | ICD-10-CM | POA: Diagnosis not present

## 2023-02-27 DIAGNOSIS — M25531 Pain in right wrist: Secondary | ICD-10-CM | POA: Diagnosis not present

## 2023-03-04 DIAGNOSIS — M7552 Bursitis of left shoulder: Secondary | ICD-10-CM | POA: Diagnosis not present

## 2023-03-04 DIAGNOSIS — M25641 Stiffness of right hand, not elsewhere classified: Secondary | ICD-10-CM | POA: Diagnosis not present

## 2023-03-04 DIAGNOSIS — M25531 Pain in right wrist: Secondary | ICD-10-CM | POA: Diagnosis not present

## 2023-03-04 DIAGNOSIS — M19012 Primary osteoarthritis, left shoulder: Secondary | ICD-10-CM | POA: Diagnosis not present

## 2023-03-04 DIAGNOSIS — M25431 Effusion, right wrist: Secondary | ICD-10-CM | POA: Diagnosis not present

## 2023-03-04 DIAGNOSIS — M25631 Stiffness of right wrist, not elsewhere classified: Secondary | ICD-10-CM | POA: Diagnosis not present

## 2023-03-07 DIAGNOSIS — M25641 Stiffness of right hand, not elsewhere classified: Secondary | ICD-10-CM | POA: Diagnosis not present

## 2023-03-07 DIAGNOSIS — M25631 Stiffness of right wrist, not elsewhere classified: Secondary | ICD-10-CM | POA: Diagnosis not present

## 2023-03-07 DIAGNOSIS — M25431 Effusion, right wrist: Secondary | ICD-10-CM | POA: Diagnosis not present

## 2023-03-07 DIAGNOSIS — M25531 Pain in right wrist: Secondary | ICD-10-CM | POA: Diagnosis not present

## 2023-03-10 DIAGNOSIS — M25512 Pain in left shoulder: Secondary | ICD-10-CM | POA: Diagnosis not present

## 2023-03-10 DIAGNOSIS — M25612 Stiffness of left shoulder, not elsewhere classified: Secondary | ICD-10-CM | POA: Diagnosis not present

## 2023-03-10 DIAGNOSIS — M25611 Stiffness of right shoulder, not elsewhere classified: Secondary | ICD-10-CM | POA: Diagnosis not present

## 2023-03-10 DIAGNOSIS — M25511 Pain in right shoulder: Secondary | ICD-10-CM | POA: Diagnosis not present

## 2023-03-10 DIAGNOSIS — M19012 Primary osteoarthritis, left shoulder: Secondary | ICD-10-CM | POA: Diagnosis not present

## 2023-03-10 DIAGNOSIS — G8929 Other chronic pain: Secondary | ICD-10-CM | POA: Diagnosis not present

## 2023-03-12 DIAGNOSIS — E1065 Type 1 diabetes mellitus with hyperglycemia: Secondary | ICD-10-CM | POA: Diagnosis not present

## 2023-03-12 DIAGNOSIS — E10319 Type 1 diabetes mellitus with unspecified diabetic retinopathy without macular edema: Secondary | ICD-10-CM | POA: Diagnosis not present

## 2023-03-14 ENCOUNTER — Other Ambulatory Visit: Payer: Self-pay | Admitting: Family Medicine

## 2023-03-14 DIAGNOSIS — M25531 Pain in right wrist: Secondary | ICD-10-CM | POA: Diagnosis not present

## 2023-03-14 DIAGNOSIS — M25641 Stiffness of right hand, not elsewhere classified: Secondary | ICD-10-CM | POA: Diagnosis not present

## 2023-03-14 DIAGNOSIS — F331 Major depressive disorder, recurrent, moderate: Secondary | ICD-10-CM

## 2023-03-14 DIAGNOSIS — M25631 Stiffness of right wrist, not elsewhere classified: Secondary | ICD-10-CM | POA: Diagnosis not present

## 2023-03-14 DIAGNOSIS — M25431 Effusion, right wrist: Secondary | ICD-10-CM | POA: Diagnosis not present

## 2023-03-18 DIAGNOSIS — M25631 Stiffness of right wrist, not elsewhere classified: Secondary | ICD-10-CM | POA: Diagnosis not present

## 2023-03-18 DIAGNOSIS — Z79899 Other long term (current) drug therapy: Secondary | ICD-10-CM | POA: Diagnosis not present

## 2023-03-18 DIAGNOSIS — M25531 Pain in right wrist: Secondary | ICD-10-CM | POA: Diagnosis not present

## 2023-03-18 DIAGNOSIS — M25431 Effusion, right wrist: Secondary | ICD-10-CM | POA: Diagnosis not present

## 2023-03-18 DIAGNOSIS — M79609 Pain in unspecified limb: Secondary | ICD-10-CM | POA: Diagnosis not present

## 2023-03-18 DIAGNOSIS — R52 Pain, unspecified: Secondary | ICD-10-CM | POA: Diagnosis not present

## 2023-03-18 DIAGNOSIS — M25641 Stiffness of right hand, not elsewhere classified: Secondary | ICD-10-CM | POA: Diagnosis not present

## 2023-03-20 ENCOUNTER — Ambulatory Visit: Payer: PPO | Admitting: Family Medicine

## 2023-03-20 DIAGNOSIS — M67814 Other specified disorders of tendon, left shoulder: Secondary | ICD-10-CM | POA: Diagnosis not present

## 2023-03-20 DIAGNOSIS — Z96612 Presence of left artificial shoulder joint: Secondary | ICD-10-CM | POA: Diagnosis not present

## 2023-03-20 DIAGNOSIS — R2689 Other abnormalities of gait and mobility: Secondary | ICD-10-CM | POA: Diagnosis not present

## 2023-03-20 DIAGNOSIS — M75112 Incomplete rotator cuff tear or rupture of left shoulder, not specified as traumatic: Secondary | ICD-10-CM | POA: Diagnosis not present

## 2023-03-20 DIAGNOSIS — G8918 Other acute postprocedural pain: Secondary | ICD-10-CM | POA: Diagnosis not present

## 2023-03-20 DIAGNOSIS — Z794 Long term (current) use of insulin: Secondary | ICD-10-CM | POA: Diagnosis not present

## 2023-03-20 DIAGNOSIS — Z885 Allergy status to narcotic agent status: Secondary | ICD-10-CM | POA: Diagnosis not present

## 2023-03-20 DIAGNOSIS — Z471 Aftercare following joint replacement surgery: Secondary | ICD-10-CM | POA: Diagnosis not present

## 2023-03-20 DIAGNOSIS — M778 Other enthesopathies, not elsewhere classified: Secondary | ICD-10-CM | POA: Diagnosis not present

## 2023-03-20 DIAGNOSIS — Z23 Encounter for immunization: Secondary | ICD-10-CM | POA: Diagnosis not present

## 2023-03-20 DIAGNOSIS — F418 Other specified anxiety disorders: Secondary | ICD-10-CM | POA: Diagnosis not present

## 2023-03-20 DIAGNOSIS — I10 Essential (primary) hypertension: Secondary | ICD-10-CM | POA: Diagnosis not present

## 2023-03-20 DIAGNOSIS — E038 Other specified hypothyroidism: Secondary | ICD-10-CM | POA: Diagnosis not present

## 2023-03-20 DIAGNOSIS — E119 Type 2 diabetes mellitus without complications: Secondary | ICD-10-CM | POA: Diagnosis not present

## 2023-03-20 DIAGNOSIS — M19012 Primary osteoarthritis, left shoulder: Secondary | ICD-10-CM | POA: Diagnosis not present

## 2023-03-27 DIAGNOSIS — M25612 Stiffness of left shoulder, not elsewhere classified: Secondary | ICD-10-CM | POA: Diagnosis not present

## 2023-03-27 DIAGNOSIS — M25512 Pain in left shoulder: Secondary | ICD-10-CM | POA: Diagnosis not present

## 2023-03-31 ENCOUNTER — Encounter: Payer: Self-pay | Admitting: Family Medicine

## 2023-04-01 DIAGNOSIS — Z96619 Presence of unspecified artificial shoulder joint: Secondary | ICD-10-CM | POA: Diagnosis not present

## 2023-04-01 DIAGNOSIS — T8459XA Infection and inflammatory reaction due to other internal joint prosthesis, initial encounter: Secondary | ICD-10-CM | POA: Diagnosis not present

## 2023-04-03 DIAGNOSIS — E103553 Type 1 diabetes mellitus with stable proliferative diabetic retinopathy, bilateral: Secondary | ICD-10-CM | POA: Diagnosis not present

## 2023-04-03 DIAGNOSIS — M25512 Pain in left shoulder: Secondary | ICD-10-CM | POA: Diagnosis not present

## 2023-04-03 DIAGNOSIS — M25612 Stiffness of left shoulder, not elsewhere classified: Secondary | ICD-10-CM | POA: Diagnosis not present

## 2023-04-04 ENCOUNTER — Encounter: Payer: HMO | Admitting: Psychology

## 2023-04-04 DIAGNOSIS — M25531 Pain in right wrist: Secondary | ICD-10-CM | POA: Diagnosis not present

## 2023-04-04 DIAGNOSIS — M25431 Effusion, right wrist: Secondary | ICD-10-CM | POA: Diagnosis not present

## 2023-04-04 DIAGNOSIS — M25631 Stiffness of right wrist, not elsewhere classified: Secondary | ICD-10-CM | POA: Diagnosis not present

## 2023-04-04 DIAGNOSIS — M25641 Stiffness of right hand, not elsewhere classified: Secondary | ICD-10-CM | POA: Diagnosis not present

## 2023-04-09 ENCOUNTER — Other Ambulatory Visit: Payer: Self-pay | Admitting: Family Medicine

## 2023-04-09 DIAGNOSIS — M25631 Stiffness of right wrist, not elsewhere classified: Secondary | ICD-10-CM | POA: Diagnosis not present

## 2023-04-09 DIAGNOSIS — M25431 Effusion, right wrist: Secondary | ICD-10-CM | POA: Diagnosis not present

## 2023-04-09 DIAGNOSIS — M25531 Pain in right wrist: Secondary | ICD-10-CM | POA: Diagnosis not present

## 2023-04-09 DIAGNOSIS — M25641 Stiffness of right hand, not elsewhere classified: Secondary | ICD-10-CM | POA: Diagnosis not present

## 2023-04-10 ENCOUNTER — Encounter: Payer: HMO | Admitting: Psychology

## 2023-04-10 DIAGNOSIS — M25512 Pain in left shoulder: Secondary | ICD-10-CM | POA: Diagnosis not present

## 2023-04-10 DIAGNOSIS — M25612 Stiffness of left shoulder, not elsewhere classified: Secondary | ICD-10-CM | POA: Diagnosis not present

## 2023-04-11 DIAGNOSIS — M25631 Stiffness of right wrist, not elsewhere classified: Secondary | ICD-10-CM | POA: Diagnosis not present

## 2023-04-11 DIAGNOSIS — E1065 Type 1 diabetes mellitus with hyperglycemia: Secondary | ICD-10-CM | POA: Diagnosis not present

## 2023-04-11 DIAGNOSIS — M25641 Stiffness of right hand, not elsewhere classified: Secondary | ICD-10-CM | POA: Diagnosis not present

## 2023-04-11 DIAGNOSIS — E10319 Type 1 diabetes mellitus with unspecified diabetic retinopathy without macular edema: Secondary | ICD-10-CM | POA: Diagnosis not present

## 2023-04-11 DIAGNOSIS — M25531 Pain in right wrist: Secondary | ICD-10-CM | POA: Diagnosis not present

## 2023-04-11 DIAGNOSIS — M25431 Effusion, right wrist: Secondary | ICD-10-CM | POA: Diagnosis not present

## 2023-04-15 ENCOUNTER — Encounter: Payer: Self-pay | Admitting: Psychology

## 2023-04-15 DIAGNOSIS — M25631 Stiffness of right wrist, not elsewhere classified: Secondary | ICD-10-CM | POA: Diagnosis not present

## 2023-04-15 DIAGNOSIS — M25641 Stiffness of right hand, not elsewhere classified: Secondary | ICD-10-CM | POA: Diagnosis not present

## 2023-04-15 DIAGNOSIS — M25431 Effusion, right wrist: Secondary | ICD-10-CM | POA: Diagnosis not present

## 2023-04-15 DIAGNOSIS — M25531 Pain in right wrist: Secondary | ICD-10-CM | POA: Diagnosis not present

## 2023-04-16 ENCOUNTER — Encounter: Payer: Self-pay | Admitting: Psychology

## 2023-04-16 ENCOUNTER — Ambulatory Visit: Payer: HMO

## 2023-04-16 ENCOUNTER — Ambulatory Visit (INDEPENDENT_AMBULATORY_CARE_PROVIDER_SITE_OTHER): Payer: PPO | Admitting: Psychology

## 2023-04-16 DIAGNOSIS — I6381 Other cerebral infarction due to occlusion or stenosis of small artery: Secondary | ICD-10-CM | POA: Diagnosis not present

## 2023-04-16 DIAGNOSIS — G4733 Obstructive sleep apnea (adult) (pediatric): Secondary | ICD-10-CM | POA: Insufficient documentation

## 2023-04-16 DIAGNOSIS — I679 Cerebrovascular disease, unspecified: Secondary | ICD-10-CM | POA: Diagnosis not present

## 2023-04-16 DIAGNOSIS — R4189 Other symptoms and signs involving cognitive functions and awareness: Secondary | ICD-10-CM | POA: Diagnosis not present

## 2023-04-16 NOTE — Progress Notes (Signed)
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 across a story learning task, 138% across a list learning task, and 100% across a shape learning task. Performance across recognition tasks was appropriate, suggesting evidence for information consolidation.   Results of emotional screening instruments suggested that recent symptoms of generalized anxiety were in the minimal to mild range, while symptoms of depression were within normal limits. A screening instrument assessing recent sleep quality suggested the presence of minimal sleep dysfunction.  Tables of Scores:   Note: This summary of test scores accompanies the interpretive report and should not be considered in isolation without reference to the appropriate sections in the text. Descriptors are based on appropriate normative data and may be adjusted based on clinical judgment. Terms such as "Within Normal Limits" and "Outside Normal Limits" are used when a more specific description of the test score cannot be determined.       Percentile - Normative Descriptor > 98 - Exceptionally High 91-97 - Well Above Average 75-90 - Above Average 25-74 - Average 9-24 - Below Average 2-8 - Well Below  Average < 2 - Exceptionally Low       Orientation:      Raw Score Percentile   NAB Orientation, Form 1 29/29 --- ---       Cognitive Screening:      Raw Score Percentile   SLUMS: 27/30 --- ---       Intellectual Functioning:      Standard Score Percentile   Test of Premorbid Functioning: 123 94 Well Above Average       Memory:     NAB Memory Module, Form 1: Standard Score/ T Score Percentile   Total Memory Index 93 32 Average  List Learning       Total Trials 1-3 25/36 (51) 54 Average    List B 6/12 (56) 73 Average    Short Delay Free Recall 8/12 (50) 50 Average    Long Delay Free Recall 11/12 (67) 96 Well Above Average    Retention Percentage 138 (63) 91 Well Above Average    Recognition Discriminability 10 (59) 82 Above Average  Shape Learning       Total Trials 1-3 10/27 (31) 3 Well Below Average    Delayed Recall 4/9 (38) 12 Below Average    Retention Percentage 100 (50) 50 Average    Recognition Discriminability 5 (42) 21 Below Average  Story Learning       Immediate Recall 70/80 (56) 73 Average    Delayed Recall 36/40 (54) 66 Average    Retention Percentage  97 (52) 58 Average  Daily Living Memory       Immediate Recall 40/51 (42) 21 Below Average    Delayed Recall 12/17 (38) 12 Below Average    Retention Percentage 80 (47) 38 Average    Recognition Hits 9/10 (53) 62 Average       Attention/Executive Function:     Trail Making Test (TMT): Raw Score (T Score) Percentile     Part A 27 secs.,  0 errors (56) 73 Average    Part B 116 secs.,  2 errors (41) 18 Below Average         Scaled Score Percentile   WAIS-IV Coding: 7 16 Below Average       NAB Attention Module, Form 1: T Score Percentile     Digits Forward 50 50 Average    Digits Backwards 61 86 Above Average        Scaled Score Percentile   WAIS-IV Similarities: 10 50 Average       D-KEFS Color-Word Interference Test: Raw Score (Scaled Score) Percentile     Color Naming 32 secs. (11) 63 Average     Word Reading 22 secs. (12) 75 Above Average    Inhibition 69 secs. (11) 63 Average      Total Errors 0 errors (13) 84 Above Average    Inhibition/Switching 98 secs. (8) 25 Average      Total Errors 1 error (12) 75 Above Average       NAB Executive Functions Module, Form 1: T Score Percentile     Judgment 65 93 Well Above Average       Language:     Verbal Fluency Test: Raw Score (T Score) Percentile     Phonemic Fluency (FAS) 37 (43) 25 Average    Animal Fluency 17 (45) 31 Average        NAB Language Module, Form 1: T Score Percentile     Auditory Comprehension 57 75 Above Average    Naming 31/31 (58) 79 Above Average       Visuospatial/Visuoconstruction:      Raw Score Percentile   Clock Drawing: 10/10 --- Within Normal Limits       NAB Spatial Module, Form 1: T Score Percentile     Figure Drawing Copy 46 34 Average        Scaled Score Percentile   WAIS-IV Block Design: 8 25 Average       Mood and Personality:      Raw Score Percentile   Geriatric Depression Scale: 4 --- Within Normal Limits  Geriatric Anxiety Scale: 11 --- Minimal    Somatic 7 --- Mild    Cognitive 0 --- Minimal    Affective 4 --- Mild       Additional Questionnaires:      Raw Score Percentile   PROMIS Sleep Disturbance Questionnaire: 19 --- None to Slight   Informed Consent and Coding/Compliance:   The current evaluation represents a clinical evaluation for the purposes previously outlined by the referral source and is in no way reflective of a forensic evaluation.   Ms. Malfitano was provided with a verbal description of the nature and purpose of the present neuropsychological evaluation. Also reviewed were the foreseeable risks and/or discomforts and benefits of the procedure, limits of confidentiality, and mandatory reporting requirements of this provider. The patient was given the opportunity to ask questions and receive answers about the evaluation. Oral consent to participate was provided by the  patient.   This evaluation  was conducted by Newman Nickels, Ph.D., ABPP-CN, board certified clinical neuropsychologist. Ms. Davison completed a clinical interview with Dr. Milbert Coulter, billed as one unit [phone], and 125 minutes of cognitive testing and scoring, billed as one unit [phone] and three additional units [id number]. Psychometrist Shan Levans, B.S., assisted Dr. Milbert Coulter with test administration and scoring procedures. As a separate and discrete service, one unit [id number] and two units [phone] were billed for Dr. Tammy Sours time spent in interpretation and report writing.

## 2023-04-16 NOTE — Progress Notes (Signed)
   Psychometrician Note   Cognitive testing was administered to [name] by Shan Levans, B.S. (psychometrist) under the supervision of Dr. Newman Nickels, Ph.D., licensed psychologist on 04/16/2023. [name] did not appear overtly distressed by the testing session per behavioral observation or responses across self-report questionnaires. Rest breaks were offered.    The battery of tests administered was selected by Dr. Newman Nickels, Ph.D. with consideration to [name]'s current level of functioning, the nature of her symptoms, emotional and behavioral responses during interview, level of literacy, observed level of motivation/effort, and the nature of the referral question. This battery was communicated to the psychometrist. Communication between Dr. Newman Nickels, Ph.D. and the psychometrist was ongoing throughout the evaluation and Dr. Newman Nickels, Ph.D. was immediately accessible at all times. Dr. Newman Nickels, Ph.D. provided supervision to the psychometrist on the date of this service to the extent necessary to assure the quality of all services provided.    [name] will return within approximately 1-2 weeks for an interactive feedback session with Dr. Milbert Coulter at which time her test performances, clinical impressions, and treatment recommendations will be reviewed in detail. [name] understands she can contact our office should she require our assistance before this time.  A total of 125 minutes of billable time were spent face-to-face with [name] by the psychometrist. This includes both test administration and scoring time. Billing for these services is reflected in the clinical report generated by Dr. Newman Nickels, Ph.D.  This note reflects time spent with the psychometrician and does not include test scores or any clinical interpretations made by Dr. Milbert Coulter. The full report will follow in a separate note.

## 2023-04-22 ENCOUNTER — Telehealth: Payer: Self-pay

## 2023-04-22 DIAGNOSIS — M25531 Pain in right wrist: Secondary | ICD-10-CM | POA: Diagnosis not present

## 2023-04-22 DIAGNOSIS — H6093 Unspecified otitis externa, bilateral: Secondary | ICD-10-CM | POA: Diagnosis not present

## 2023-04-22 DIAGNOSIS — M25431 Effusion, right wrist: Secondary | ICD-10-CM | POA: Diagnosis not present

## 2023-04-22 DIAGNOSIS — H9203 Otalgia, bilateral: Secondary | ICD-10-CM | POA: Diagnosis not present

## 2023-04-22 DIAGNOSIS — M25631 Stiffness of right wrist, not elsewhere classified: Secondary | ICD-10-CM | POA: Diagnosis not present

## 2023-04-22 DIAGNOSIS — M25641 Stiffness of right hand, not elsewhere classified: Secondary | ICD-10-CM | POA: Diagnosis not present

## 2023-04-22 NOTE — Telephone Encounter (Signed)
Patient informed and scheduled.

## 2023-04-22 NOTE — Telephone Encounter (Signed)
Dr. Sedalia Muta,  The patient called today requesting an appointment for her ears. The patient was seen at [facility] today. She would like a second opinion. Patient was told that the preferred treatment was prednisone. The patient stated that she is unable to take prednisone due to being a type 1 diabetic. He recommended other the counter medication. The patient only wants to see you.   Please advise.

## 2023-04-22 NOTE — Telephone Encounter (Signed)
Schedule tomorrow at 10 am. Dr. Sedalia Muta

## 2023-04-23 ENCOUNTER — Ambulatory Visit (INDEPENDENT_AMBULATORY_CARE_PROVIDER_SITE_OTHER): Payer: PPO | Admitting: Family Medicine

## 2023-04-23 ENCOUNTER — Other Ambulatory Visit: Payer: Self-pay | Admitting: Family Medicine

## 2023-04-23 ENCOUNTER — Ambulatory Visit (INDEPENDENT_AMBULATORY_CARE_PROVIDER_SITE_OTHER): Payer: PPO | Admitting: Psychology

## 2023-04-23 ENCOUNTER — Encounter: Payer: Self-pay | Admitting: *Deleted

## 2023-04-23 VITALS — BP 120/70 | HR 88 | Temp 97.2°F | Resp 16 | Ht 63.0 in | Wt 161.0 lb

## 2023-04-23 DIAGNOSIS — R4189 Other symptoms and signs involving cognitive functions and awareness: Secondary | ICD-10-CM | POA: Diagnosis not present

## 2023-04-23 DIAGNOSIS — I6381 Other cerebral infarction due to occlusion or stenosis of small artery: Secondary | ICD-10-CM | POA: Diagnosis not present

## 2023-04-23 DIAGNOSIS — H9223 Otorrhagia, bilateral: Secondary | ICD-10-CM

## 2023-04-23 DIAGNOSIS — I1A0 Resistant hypertension: Secondary | ICD-10-CM

## 2023-04-23 DIAGNOSIS — I679 Cerebrovascular disease, unspecified: Secondary | ICD-10-CM

## 2023-04-23 DIAGNOSIS — H938X3 Other specified disorders of ear, bilateral: Secondary | ICD-10-CM | POA: Diagnosis not present

## 2023-04-23 DIAGNOSIS — T169XXA Foreign body in ear, unspecified ear, initial encounter: Secondary | ICD-10-CM | POA: Diagnosis not present

## 2023-04-23 DIAGNOSIS — F02A18 Dementia in other diseases classified elsewhere, mild, with other behavioral disturbance: Secondary | ICD-10-CM

## 2023-04-23 NOTE — Progress Notes (Signed)
   Neuropsychology Feedback Session Eligha Bridegroom. [facility] Department of Neurology  Reason for Referral:   [name] is a 72 y.o. right-handed Caucasian female referred by Marlowe Kays, PA-C, to characterize her current cognitive functioning and assist with diagnostic clarity and treatment planning in the context of subjective cognitive decline.   Feedback:   Ms. Nearhoof completed a comprehensive neuropsychological evaluation on 04/16/2023. Please refer to that encounter for the full report and recommendations. Briefly, results suggested neuropsychological functioning largely within normal limits relative to age-matched peers. An isolated impairment was exhibited efficiently learning novel visual information across a shape learning task. However, her delayed retention rate was 100% and she performed well across a recognition trial, as well as across all aspects of verbal memory testing. As such, there is no objective evidence to suggest consistent memory dysfunction presently. Performances were also adequate across processing speed, attention/concentration, executive functioning, safety/judgment receptive and expressive language, and visuospatial abilities. Day-to-day dysfunction could be related to chronic microvascular ischemic disease over the years, made mildly worse by her small 2022 infarct, superimposed on the normal aging process. Any ongoing psychiatric distress (she reported mild anxiety across mood-related questionnaires) or acute stressors in her day-to-day life could also exacerbate difficulties.   Ms. Arber was unaccompanied during the current telephone call. She was within her residence while I was within my office. I discussed the limitations of evaluation and management by telemedicine and the availability of in person appointments. Ms. Arman expressed her understanding and agreed to proceed. Content of the current session focused on the results of her  neuropsychological evaluation. Ms. Teats was given the opportunity to ask questions and her questions were answered. She was encouraged to reach out should additional questions arise. A copy of her report was mailed at the conclusion of the visit.      One unit [phone] was billed for Dr. Tammy Sours time spent preparing for, conducting, and documenting the current feedback session with Ms. Thresa Ross.

## 2023-04-23 NOTE — Progress Notes (Signed)
[facility] Quality Team Note  Name: [name] Date of Birth: 28-Apr-1951 MRN: [id number] Date: 04/23/2023  [facility] Quality Team has reviewed this patient's chart, please see recommendations below:  [facility] Quality Other; Pt has open gaps for AWV and mammogram.  Pt has ov scheduled for 05/27/2023.  Would provider be able to address open gap for mammogram then?

## 2023-04-23 NOTE — Progress Notes (Signed)
Acute Office Visit  Subjective:    Patient ID: [name], female    DOB: 1951/05/31, 72 y.o.   MRN: [id number]  Chief Complaint  Patient presents with   Hearing Loss    HPI: Patient is in today for ear congestion.  She was visiting her daughter in [place] for 5 days and developed ear congestion with decreased hearing.  She was seen at Urgent Care yesterday with no cerumen impaction found.  She has been started on Flonase OTC.  She comes in today for a second opinion.    Past Medical History:  Diagnosis Date   Abdominal bloating 08/12/2022   Acquired hypothyroidism 12/23/2021   Acute respiratory failure with hypoxemia 04/20/2014   Asthma 08/14/2015   Ataxia 12/23/2021   Capsulitis of metatarsophalangeal (MTP) joint of left foot 01/22/2018   Cough due to ACE inhibitor 09/30/2021   Daytime somnolence 12/23/2021   Essential hypertension 04/15/2014   External hemorrhoid 11/04/2022   GERD (gastroesophageal reflux disease)    Hearing loss of left ear due to cerumen impaction 10/25/2021   Imbalance 07/11/2022   Lacunar infarction 01/10/2021   right dorsal pons   Left tibial fracture 08/03/2019   Major depressive disorder 01/21/2022   Mixed hyperlipidemia 02/17/2022   Nocturia 09/30/2021   Obstructive sleep apnea    No CPAP   Primary localized osteoarthrosis, lower leg 04/18/2014   Snoring 09/30/2021   Tremor 06/29/2022   Type 1 diabetes mellitus with hyperglycemia 09/30/2021   Urine WBC increased 07/11/2022    Past Surgical History:  Procedure Laterality Date   APPENDECTOMY     APPLICATION OF WOUND VAC Left 08/03/2019   Procedure: Application Of Wound Vac;  Surgeon: Myrene Galas, MD;  Location: [facility] OR;  Service: Orthopedics;  Laterality: Left;   CESAREAN SECTION     ESOPHAGOGASTRODUODENOSCOPY (EGD) WITH PROPOFOL N/A 02/02/2016   Procedure: ESOPHAGOGASTRODUODENOSCOPY (EGD) WITH PROPOFOL;  Surgeon: Elnita Maxwell, MD;  Location: [facility] ENDOSCOPY;  Service:  Endoscopy;  Laterality: N/A;   HAND SURGERY     JOINT REPLACEMENT Bilateral    Knee    ORIF ANKLE FRACTURE Left 08/03/2019   with wound vac applied    ORIF ANKLE FRACTURE Left 08/03/2019   Procedure: OPEN REDUCTION INTERNAL FIXATION (ORIF) ANKLE FRACTURE;  Surgeon: Myrene Galas, MD;  Location: [facility] OR;  Service: Orthopedics;  Laterality: Left;   TIBIALIS TENDON TRANSFER / REPAIR      Family History  Problem Relation Age of Onset   Cancer Mother        Colon cancer    Dementia Father        mid 68s, unspecified type   Neuropathy Father    Schizophrenia Brother     Social History   Socioeconomic History   Marital status: Single    Spouse name: Not on file   Number of children: 2   Years of education: 16   Highest education level: Bachelor's degree (e.g., BA, AB, BS)  Occupational History   Occupation: Retired    Comment: Runner, broadcasting/film/video  Tobacco Use   Smoking status: Never   Smokeless tobacco: Never  Vaping Use   Vaping Use: Never used  Substance and Sexual Activity   Alcohol use: Not Currently   Drug use: Never   Sexual activity: Not Currently  Other Topics Concern   Not on file  Social History Narrative   Right hand   Lives alone   Drinks caffeine   An apartment building   Social Determinants of Health  Financial Resource Strain: Not on file  Food Insecurity: Not on file  Transportation Needs: Not on file  Physical Activity: Not on file  Stress: Not on file  Social Connections: Not on file  Intimate Partner Violence: Not on file    Outpatient Medications Prior to Visit  Medication Sig Dispense Refill   fluticasone (FLONASE) 50 MCG/ACT nasal spray Place into both nostrils daily.     acyclovir (ZOVIRAX) 800 MG tablet TAKE 1 TABLET (800 MG) BY MOUTH 4 TIMES PER DAY FOR 3-7 DAYS AS NEEDED 30 tablet 2   aspirin EC 81 MG tablet Take 81 mg by mouth 2 (two) times a week.      calcium carbonate (OSCAL) 1500 (600 Ca) MG TABS tablet Take by mouth 2 (two) times daily with  a meal.     clopidogrel (PLAVIX) 75 MG tablet Take 1 tablet (75 mg total) by mouth daily. 90 tablet 0   donepezil (ARICEPT) 5 MG tablet TAKE 1 TABLET BY MOUTH EVERYDAY AT BEDTIME 90 tablet 1   ferrous sulfate 325 (65 FE) MG tablet Take 325 mg by mouth daily with breakfast.     Glucagon (GVOKE HYPOPEN 2-PACK) 0.5 MG/0.1ML SOAJ Inject 0.5 mg into the skin daily. As needed for low sugars less than 60 if does not respond oral sugar. 0.2 mL 2   hydrocortisone (ANUSOL-HC) 2.5 % rectal cream Place 1 Application rectally 2 (two) times daily. 30 g 1   insulin aspart (NOVOLOG) 100 UNIT/ML injection Inject 2-3 Units into the skin 3 (three) times daily before meals. Inject 6 units every am, 6 units at noon and 3 units at bedtime.      insulin degludec (TRESIBA FLEXTOUCH) 100 UNIT/ML FlexTouch Pen Inject 19 Units into the skin daily.     LORazepam (ATIVAN) 0.5 MG tablet TAKE 1 TABLET BY MOUTH EVERY DAY AS NEEDED FOR ANXIETY 30 tablet 1   omeprazole (PRILOSEC) 40 MG capsule TAKE 1 CAPSULE BY MOUTH EVERY DAY 90 capsule 3   propranolol ER (INDERAL LA) 60 MG 24 hr capsule TAKE 1 CAPSULE BY MOUTH EVERY DAY 90 capsule 1   rosuvastatin (CRESTOR) 40 MG tablet TAKE 1 TABLET BY MOUTH EVERY DAY 90 tablet 1   valsartan (DIOVAN) 320 MG tablet TAKE 1 TABLET BY MOUTH EVERY DAY 90 tablet 0   venlafaxine XR (EFFEXOR-XR) 75 MG 24 hr capsule TAKE 1 CAPSULE BY MOUTH DAILY WITH BREAKFAST. 90 capsule 1   Vitamin D, Ergocalciferol, (DRISDOL) 1.25 MG (50000 UNIT) CAPS capsule TAKE 1 CAPSULE BY MOUTH EACH WEEK 12 capsule 3   amLODipine (NORVASC) 2.5 MG tablet TAKE 1 TABLET BY MOUTH EVERY DAY 90 tablet 1   donepezil (ARICEPT) 10 MG tablet Take 1 tablet (10 mg total) by mouth at bedtime. 90 tablet 1   ezetimibe (ZETIA) 10 MG tablet TAKE 1 TABLET BY MOUTH EVERY DAY 90 tablet 1   levothyroxine (SYNTHROID) 125 MCG tablet TAKE 1 TABLET BY MOUTH DAILY BEFORE BREAKFAST. 90 tablet 1   memantine (NAMENDA) 5 MG tablet TAKE 1 TABLET BY MOUTH  TWICE A DAY 180 tablet 1   tolterodine (DETROL LA) 2 MG 24 hr capsule TAKE 1 CAPSULE BY MOUTH EVERYDAY AT BEDTIME 90 capsule 3   venlafaxine XR (EFFEXOR-XR) 150 MG 24 hr capsule TAKE 1 CAPSULE BY MOUTH EVERY DAY 90 capsule 1   No facility-administered medications prior to visit.    Allergies  Allergen Reactions   Lisinopril Cough   Zetia [Ezetimibe]     CONFUSION  Review of Systems  Constitutional:  Negative for chills and fever.  HENT:  Positive for ear pain.   Musculoskeletal:  Positive for arthralgias (recovering from left shoulder surgery.).  Neurological:  Negative for dizziness and headaches.       Objective:        04/23/2023   10:27 AM 12/27/2022   10:10 AM 12/16/2022   10:48 AM  Vitals with BMI  Height 5\' 3"  5\' 3"    Weight 161 lbs 159 lbs 13 oz   BMI 28.53 28.31   Systolic 120 148 161  Diastolic 70 80 82  Pulse 88 81     No data found.   Physical Exam Vitals reviewed.  Constitutional:      Appearance: Normal appearance.  HENT:     Ears:     Comments: BL ears: Black material filling each ear. Unable to view Tms. No erythema. No discharge. No erythema of canals.  Neurological:     Mental Status: She is alert.     Health Maintenance Due  Topic Date Due   Diabetic kidney evaluation - Urine ACR  Never done   Hepatitis C Screening  Never done   Medicare Annual Wellness (AWV)  08/13/2019   COVID-19 Vaccine (6 - 2023-24 season) 07/19/2022   FOOT EXAM  07/31/2022   MAMMOGRAM  03/24/2023    There are no preventive care reminders to display for this patient.   Lab Results  Component Value Date   TSH 1.450 02/05/2023   Lab Results  Component Value Date   WBC 5.3 10/31/2022   HGB 12.7 10/31/2022   HCT 38 10/31/2022   MCV 94 06/24/2022   PLT 275 10/31/2022   Lab Results  Component Value Date   NA 139 06/24/2022   K 4.1 10/31/2022   CO2 24 06/24/2022   GLUCOSE 189 (H) 06/24/2022   BUN 14 06/24/2022   CREATININE 0.7 10/31/2022   BILITOT 0.2  06/24/2022   ALKPHOS 133 (H) 06/24/2022   AST 39 (A) 10/31/2022   ALT 33 10/31/2022   PROT 6.4 06/24/2022   ALBUMIN 4.3 06/24/2022   CALCIUM 9.5 06/24/2022   ANIONGAP 9 08/05/2019   EGFR 60 10/31/2022   Lab Results  Component Value Date   CHOL 213 (H) 06/24/2022   Lab Results  Component Value Date   HDL 118 06/24/2022   Lab Results  Component Value Date   LDLCALC 82 06/24/2022   Lab Results  Component Value Date   TRIG 78 06/24/2022   Lab Results  Component Value Date   CHOLHDL 1.8 06/24/2022   Lab Results  Component Value Date   HGBA1C 8.4 (H) 12/16/2022       Assessment & Plan:  Blood in both ear canals Assessment & Plan: Unclear if old blood versus foreign body.  Urgent referral to ENT.   Patient went back to urgent care one day after appointment with me due to ent appointment not being scheduled until 06/2023 despite my urgent referral. Urgent care provider was able to remove debris which ended up being pieces of hearing aids.       No orders of the defined types were placed in this encounter.   No orders of the defined types were placed in this encounter.    Follow-up: No follow-ups on file.  An After Visit Summary was printed and given to the patient.  Blane Ohara, MD [facility] [phone]

## 2023-04-24 DIAGNOSIS — M25531 Pain in right wrist: Secondary | ICD-10-CM | POA: Diagnosis not present

## 2023-04-24 DIAGNOSIS — M25631 Stiffness of right wrist, not elsewhere classified: Secondary | ICD-10-CM | POA: Diagnosis not present

## 2023-04-24 DIAGNOSIS — M25641 Stiffness of right hand, not elsewhere classified: Secondary | ICD-10-CM | POA: Diagnosis not present

## 2023-04-24 DIAGNOSIS — M25431 Effusion, right wrist: Secondary | ICD-10-CM | POA: Diagnosis not present

## 2023-04-24 DIAGNOSIS — M25612 Stiffness of left shoulder, not elsewhere classified: Secondary | ICD-10-CM | POA: Diagnosis not present

## 2023-04-24 DIAGNOSIS — M25512 Pain in left shoulder: Secondary | ICD-10-CM | POA: Diagnosis not present

## 2023-04-28 ENCOUNTER — Encounter: Payer: Self-pay | Admitting: Family Medicine

## 2023-04-28 ENCOUNTER — Ambulatory Visit: Payer: HMO | Admitting: Neurology

## 2023-04-28 DIAGNOSIS — L820 Inflamed seborrheic keratosis: Secondary | ICD-10-CM | POA: Diagnosis not present

## 2023-04-28 DIAGNOSIS — R140 Abdominal distension (gaseous): Secondary | ICD-10-CM | POA: Diagnosis not present

## 2023-04-28 DIAGNOSIS — M25531 Pain in right wrist: Secondary | ICD-10-CM | POA: Diagnosis not present

## 2023-04-28 DIAGNOSIS — M25631 Stiffness of right wrist, not elsewhere classified: Secondary | ICD-10-CM | POA: Diagnosis not present

## 2023-04-28 DIAGNOSIS — L814 Other melanin hyperpigmentation: Secondary | ICD-10-CM | POA: Diagnosis not present

## 2023-04-28 DIAGNOSIS — K649 Unspecified hemorrhoids: Secondary | ICD-10-CM | POA: Diagnosis not present

## 2023-04-28 DIAGNOSIS — M25641 Stiffness of right hand, not elsewhere classified: Secondary | ICD-10-CM | POA: Diagnosis not present

## 2023-04-28 DIAGNOSIS — K5900 Constipation, unspecified: Secondary | ICD-10-CM | POA: Diagnosis not present

## 2023-04-28 DIAGNOSIS — L570 Actinic keratosis: Secondary | ICD-10-CM | POA: Diagnosis not present

## 2023-04-28 DIAGNOSIS — H9223 Otorrhagia, bilateral: Secondary | ICD-10-CM | POA: Insufficient documentation

## 2023-04-28 DIAGNOSIS — M25431 Effusion, right wrist: Secondary | ICD-10-CM | POA: Diagnosis not present

## 2023-04-28 DIAGNOSIS — K219 Gastro-esophageal reflux disease without esophagitis: Secondary | ICD-10-CM | POA: Diagnosis not present

## 2023-04-28 NOTE — Assessment & Plan Note (Signed)
Unclear if old blood versus foreign body.  Urgent referral to ENT.   Patient went back to urgent care one day after appointment with me due to ent appointment not being scheduled until 06/2023 despite my urgent referral. Urgent care provider was able to remove debris which ended up being pieces of hearing aids.

## 2023-04-30 DIAGNOSIS — M25431 Effusion, right wrist: Secondary | ICD-10-CM | POA: Diagnosis not present

## 2023-04-30 DIAGNOSIS — M25631 Stiffness of right wrist, not elsewhere classified: Secondary | ICD-10-CM | POA: Diagnosis not present

## 2023-04-30 DIAGNOSIS — M25531 Pain in right wrist: Secondary | ICD-10-CM | POA: Diagnosis not present

## 2023-04-30 DIAGNOSIS — M25641 Stiffness of right hand, not elsewhere classified: Secondary | ICD-10-CM | POA: Diagnosis not present

## 2023-05-05 DIAGNOSIS — R140 Abdominal distension (gaseous): Secondary | ICD-10-CM | POA: Diagnosis not present

## 2023-05-06 ENCOUNTER — Telehealth: Payer: PPO | Admitting: Neurology

## 2023-05-08 DIAGNOSIS — M25512 Pain in left shoulder: Secondary | ICD-10-CM | POA: Diagnosis not present

## 2023-05-08 DIAGNOSIS — Z96612 Presence of left artificial shoulder joint: Secondary | ICD-10-CM | POA: Diagnosis not present

## 2023-05-08 DIAGNOSIS — E10319 Type 1 diabetes mellitus with unspecified diabetic retinopathy without macular edema: Secondary | ICD-10-CM | POA: Diagnosis not present

## 2023-05-08 DIAGNOSIS — R531 Weakness: Secondary | ICD-10-CM | POA: Diagnosis not present

## 2023-05-09 ENCOUNTER — Telehealth (INDEPENDENT_AMBULATORY_CARE_PROVIDER_SITE_OTHER): Payer: PPO | Admitting: Neurology

## 2023-05-09 ENCOUNTER — Encounter: Payer: Self-pay | Admitting: Neurology

## 2023-05-09 VITALS — Ht 63.0 in | Wt 160.0 lb

## 2023-05-09 DIAGNOSIS — M79672 Pain in left foot: Secondary | ICD-10-CM | POA: Diagnosis not present

## 2023-05-09 DIAGNOSIS — S90852A Superficial foreign body, left foot, initial encounter: Secondary | ICD-10-CM | POA: Diagnosis not present

## 2023-05-09 DIAGNOSIS — Z23 Encounter for immunization: Secondary | ICD-10-CM | POA: Diagnosis not present

## 2023-05-09 DIAGNOSIS — I679 Cerebrovascular disease, unspecified: Secondary | ICD-10-CM | POA: Diagnosis not present

## 2023-05-09 DIAGNOSIS — R4189 Other symptoms and signs involving cognitive functions and awareness: Secondary | ICD-10-CM | POA: Diagnosis not present

## 2023-05-09 DIAGNOSIS — M7732 Calcaneal spur, left foot: Secondary | ICD-10-CM | POA: Diagnosis not present

## 2023-05-09 NOTE — Patient Instructions (Signed)
Good to see you doing well. Wean off Donepezil: take 1/2 tablet daily for 1 week, then stop. Continue to monitor cognition off medication. Recommend using CPAP regularly. Follow-up in 6 months, call for any changes.
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
## 2023-05-11 ENCOUNTER — Other Ambulatory Visit: Payer: Self-pay | Admitting: Family Medicine

## 2023-05-12 DIAGNOSIS — E10319 Type 1 diabetes mellitus with unspecified diabetic retinopathy without macular edema: Secondary | ICD-10-CM | POA: Diagnosis not present

## 2023-05-12 DIAGNOSIS — E1065 Type 1 diabetes mellitus with hyperglycemia: Secondary | ICD-10-CM | POA: Diagnosis not present

## 2023-05-14 ENCOUNTER — Encounter: Payer: Self-pay | Admitting: Neurology

## 2023-05-14 DIAGNOSIS — M810 Age-related osteoporosis without current pathological fracture: Secondary | ICD-10-CM | POA: Diagnosis not present

## 2023-05-14 DIAGNOSIS — Z794 Long term (current) use of insulin: Secondary | ICD-10-CM | POA: Diagnosis not present

## 2023-05-14 DIAGNOSIS — Z8781 Personal history of (healed) traumatic fracture: Secondary | ICD-10-CM | POA: Diagnosis not present

## 2023-05-14 DIAGNOSIS — E782 Mixed hyperlipidemia: Secondary | ICD-10-CM | POA: Diagnosis not present

## 2023-05-14 DIAGNOSIS — E10319 Type 1 diabetes mellitus with unspecified diabetic retinopathy without macular edema: Secondary | ICD-10-CM | POA: Diagnosis not present

## 2023-05-20 ENCOUNTER — Ambulatory Visit: Payer: PPO | Admitting: Family Medicine

## 2023-05-20 DIAGNOSIS — R531 Weakness: Secondary | ICD-10-CM | POA: Diagnosis not present

## 2023-05-20 DIAGNOSIS — M25512 Pain in left shoulder: Secondary | ICD-10-CM | POA: Diagnosis not present

## 2023-05-20 DIAGNOSIS — Z96612 Presence of left artificial shoulder joint: Secondary | ICD-10-CM | POA: Diagnosis not present

## 2023-05-21 DIAGNOSIS — R531 Weakness: Secondary | ICD-10-CM | POA: Diagnosis not present

## 2023-05-21 DIAGNOSIS — Z96612 Presence of left artificial shoulder joint: Secondary | ICD-10-CM | POA: Diagnosis not present

## 2023-05-21 DIAGNOSIS — M25512 Pain in left shoulder: Secondary | ICD-10-CM | POA: Diagnosis not present

## 2023-05-26 NOTE — Progress Notes (Unsigned)
Subjective:  Patient ID: [name], female    DOB: Jul 06, 1951  Age: 72 y.o. MRN: [id number]  Chief Complaint  Patient presents with   Medical Management of Chronic Issues    HPI  HTN: Norvasc 2.5 mg daily, Propranolol 60 mg daily and diovan 320 mg daily.  DMI: Novolog 2-3 units before meals, 6 units q a.m., 6 units at noon and 3 units at bedtime, tresiba 19 units daily. A1C 7.9  Hypothyroid: Synthroid 125 mcg daily, last TSH 1.4  GERD: Prilosec 40 mg daily  Cholesterol: Crestor 40 mg daily  Depression/Anxiety: Effexor 150 mg daily, Ativan 0.5 mg as needed  Osteoporosis:  Alendronate 70 mg weekly.       05/27/2023   11:02 AM 12/16/2022   10:11 AM 12/16/2022   10:10 AM 08/12/2022   10:38 AM 01/31/2022    3:48 PM  Depression screen PHQ 2/9  Decreased Interest 0 0 0 0 0  Down, Depressed, Hopeless 0 0 0 0 0  PHQ - 2 Score 0 0 0 0 0  Altered sleeping 0 0  2 1  Tired, decreased energy 0 0  0 1  Change in appetite 0 0  3 1  Feeling bad or failure about yourself  0 0  0 0  Trouble concentrating 0 0  0 0  Moving slowly or fidgety/restless 0 0  0 0  Suicidal thoughts 0 0  0 0  PHQ-9 Score 0 0  5 3  Difficult doing work/chores Not difficult at all Not difficult at all  Not difficult at all Not difficult at all        05/27/2023   11:02 AM  Fall Risk   Falls in the past year? 0  Number falls in past yr: 0  Injury with Fall? 0  Risk for fall due to : No Fall Risks  Follow up Falls evaluation completed;Falls prevention discussed    Patient Care Team: Blane Ohara, MD as PCP - General (Family Medicine) Baldo Daub, MD as PCP - Cardiology (Cardiology) Van Clines, MD as Consulting Physician (Neurology) Tedd Sias Marlana Salvage, MD as Physician Assistant (Endocrinology)   Review of Systems  Constitutional:  Negative for chills, fatigue and fever.  HENT:  Negative for congestion, ear pain, rhinorrhea and sore throat.   Respiratory:  Negative for cough and shortness of  breath.   Cardiovascular:  Negative for chest pain.  Gastrointestinal:  Negative for abdominal pain, constipation, diarrhea, nausea and vomiting.  Genitourinary:  Negative for dysuria and urgency.  Musculoskeletal:  Positive for arthralgias (left shoulder (getting physical therapy twice weekly).). Negative for back pain and myalgias.  Neurological:  Negative for dizziness, weakness, light-headedness and headaches.  Psychiatric/Behavioral:  Negative for dysphoric mood. The patient is not nervous/anxious.     Current Outpatient Medications on File Prior to Visit  Medication Sig Dispense Refill   alendronate (FOSAMAX) 70 MG tablet Take 70 mg by mouth once a week.     acyclovir (ZOVIRAX) 800 MG tablet TAKE 1 TABLET (800 MG) BY MOUTH 4 TIMES PER DAY FOR 3-7 DAYS AS NEEDED 30 tablet 2   amLODipine (NORVASC) 2.5 MG tablet TAKE 1 TABLET BY MOUTH EVERY DAY 90 tablet 1   aspirin EC 81 MG tablet Take 81 mg by mouth daily.     calcium carbonate (OSCAL) 1500 (600 Ca) MG TABS tablet Take by mouth 2 (two) times daily with a meal.     ferrous sulfate 325 (65 FE) MG tablet Take  325 mg by mouth daily with breakfast.     fluticasone (FLONASE) 50 MCG/ACT nasal spray Place into both nostrils daily.     Glucagon (GVOKE HYPOPEN 2-PACK) 0.5 MG/0.1ML SOAJ Inject 0.5 mg into the skin daily. As needed for low sugars less than 60 if does not respond oral sugar. (Patient not taking: Reported on 05/09/2023) 0.2 mL 2   insulin aspart (NOVOLOG) 100 UNIT/ML injection Inject 2-3 Units into the skin 3 (three) times daily before meals. Inject 6 units every am, 6 units at noon and 3 units at bedtime.      insulin degludec (TRESIBA FLEXTOUCH) 100 UNIT/ML FlexTouch Pen Inject 19 Units into the skin daily.     levothyroxine (SYNTHROID) 125 MCG tablet TAKE 1 TABLET BY MOUTH EVERY DAY BEFORE BREAKFAST 90 tablet 1   LORazepam (ATIVAN) 0.5 MG tablet TAKE 1 TABLET BY MOUTH EVERY DAY AS NEEDED FOR ANXIETY 30 tablet 1   omeprazole  (PRILOSEC) 40 MG capsule TAKE 1 CAPSULE BY MOUTH EVERY DAY 90 capsule 3   propranolol ER (INDERAL LA) 60 MG 24 hr capsule TAKE 1 CAPSULE BY MOUTH EVERY DAY 90 capsule 1   rosuvastatin (CRESTOR) 40 MG tablet TAKE 1 TABLET BY MOUTH EVERY DAY 90 tablet 1   valsartan (DIOVAN) 320 MG tablet TAKE 1 TABLET BY MOUTH EVERY DAY 90 tablet 0   venlafaxine XR (EFFEXOR-XR) 150 MG 24 hr capsule TAKE 1 CAPSULE BY MOUTH EVERY DAY 90 capsule 1   vitamin B-12 (CYANOCOBALAMIN) 100 MCG tablet Take 100 mcg by mouth daily.     Vitamin D, Ergocalciferol, (DRISDOL) 1.25 MG (50000 UNIT) CAPS capsule TAKE 1 CAPSULE BY MOUTH EACH WEEK 12 capsule 3   No current facility-administered medications on file prior to visit.   Past Medical History:  Diagnosis Date   Abdominal bloating 08/12/2022   Acquired hypothyroidism 12/23/2021   Acute respiratory failure with hypoxemia 04/20/2014   Asthma 08/14/2015   Ataxia 12/23/2021   Capsulitis of metatarsophalangeal (MTP) joint of left foot 01/22/2018   Cough due to ACE inhibitor 09/30/2021   Daytime somnolence 12/23/2021   Essential hypertension 04/15/2014   External hemorrhoid 11/04/2022   GERD (gastroesophageal reflux disease)    Hearing loss of left ear due to cerumen impaction 10/25/2021   Imbalance 07/11/2022   Lacunar infarction 01/10/2021   right dorsal pons   Left tibial fracture 08/03/2019   Major depressive disorder 01/21/2022   Mixed hyperlipidemia 02/17/2022   Nocturia 09/30/2021   Obstructive sleep apnea    No CPAP   Primary localized osteoarthrosis, lower leg 04/18/2014   Snoring 09/30/2021   Tremor 06/29/2022   Type 1 diabetes mellitus with hyperglycemia 09/30/2021   Urine WBC increased 07/11/2022   Past Surgical History:  Procedure Laterality Date   APPENDECTOMY     APPLICATION OF WOUND VAC Left 08/03/2019   Procedure: Application Of Wound Vac;  Surgeon: Myrene Galas, MD;  Location: [facility] OR;  Service: Orthopedics;  Laterality: Left;   CESAREAN  SECTION     ESOPHAGOGASTRODUODENOSCOPY (EGD) WITH PROPOFOL N/A 02/02/2016   Procedure: ESOPHAGOGASTRODUODENOSCOPY (EGD) WITH PROPOFOL;  Surgeon: Elnita Maxwell, MD;  Location: [facility] ENDOSCOPY;  Service: Endoscopy;  Laterality: N/A;   HAND SURGERY     JOINT REPLACEMENT Bilateral    Knee    ORIF ANKLE FRACTURE Left 08/03/2019   with wound vac applied    ORIF ANKLE FRACTURE Left 08/03/2019   Procedure: OPEN REDUCTION INTERNAL FIXATION (ORIF) ANKLE FRACTURE;  Surgeon: Myrene Galas, MD;  Location:  [facility] OR;  Service: Orthopedics;  Laterality: Left;   TIBIALIS TENDON TRANSFER / REPAIR      Family History  Problem Relation Age of Onset   Cancer Mother        Colon cancer    Dementia Father        mid 39s, unspecified type   Neuropathy Father    Schizophrenia Brother    Social History   Socioeconomic History   Marital status: Single    Spouse name: Not on file   Number of children: 2   Years of education: 16   Highest education level: Bachelor's degree (e.g., BA, AB, BS)  Occupational History   Occupation: Retired    Comment: Runner, broadcasting/film/video  Tobacco Use   Smoking status: Never   Smokeless tobacco: Never  Vaping Use   Vaping Use: Never used  Substance and Sexual Activity   Alcohol use: Not Currently   Drug use: Never   Sexual activity: Not Currently  Other Topics Concern   Not on file  Social History Narrative   Right hand   Lives alone   Drinks caffeine   An apartment building   Social Determinants of Health   Financial Resource Strain: Not on file  Food Insecurity: Not on file  Transportation Needs: Not on file  Physical Activity: Not on file  Stress: Not on file  Social Connections: Not on file    Objective:  BP 132/70   Pulse 80   Temp (!) 97 F (36.1 C)   Resp 14   Ht 5\' 3"  (1.6 m)   Wt 161 lb (73 kg)   BMI 28.52 kg/m      05/27/2023   11:00 AM 05/09/2023   12:44 PM 04/23/2023   10:27 AM  BP/Weight  Systolic BP 132  161  Diastolic BP 70  70  Wt. (Lbs)  161 160 161  BMI 28.52 kg/m2 28.34 kg/m2 28.52 kg/m2    Physical Exam Vitals reviewed.  Constitutional:      Appearance: Normal appearance. She is normal weight.  Neck:     Vascular: No carotid bruit.  Cardiovascular:     Rate and Rhythm: Normal rate and regular rhythm.     Heart sounds: Normal heart sounds.  Pulmonary:     Effort: Pulmonary effort is normal. No respiratory distress.     Breath sounds: Normal breath sounds.  Abdominal:     General: Abdomen is flat. Bowel sounds are normal.     Palpations: Abdomen is soft.     Tenderness: There is no abdominal tenderness.  Neurological:     Mental Status: She is alert and oriented to person, place, and time.  Psychiatric:        Mood and Affect: Mood normal.        Behavior: Behavior normal.     Diabetic Foot Exam - Simple   Simple Foot Form Visual Inspection See comments: Yes Sensation Testing Intact to touch and monofilament testing bilaterally: Yes Pulse Check Posterior Tibialis and Dorsalis pulse intact bilaterally: Yes Comments Callouses and dry skin both feet.        Lab Results  Component Value Date   WBC 5.3 10/31/2022   HGB 12.7 10/31/2022   HCT 38 10/31/2022   PLT 275 10/31/2022   GLUCOSE 189 (H) 06/24/2022   CHOL 213 (H) 06/24/2022   TRIG 78 06/24/2022   HDL 118 06/24/2022   LDLCALC 82 06/24/2022   ALT 33 10/31/2022   AST 39 (A) 10/31/2022  NA 139 06/24/2022   K 4.1 10/31/2022   CL 101 06/24/2022   CREATININE 0.7 10/31/2022   BUN 14 06/24/2022   CO2 24 06/24/2022   TSH 1.450 02/05/2023   INR 0.9 08/03/2019   HGBA1C 8.4 (H) 12/16/2022      Assessment & Plan:    Acquired hypothyroidism Assessment & Plan: Continue synthroid 125 mcg daily.   Obtain recent labs from outside provider.     Type 1 diabetes mellitus with hyperglycemia Assessment & Plan: Managed by Endocrinology. Continue Novolog sliding scaled before meals, and at bedtime. Continue Tresiba 19 units daily. Daily foot  checks.    Orders: -     Microalbumin / creatinine urine ratio  Gastroesophageal reflux disease without esophagitis Assessment & Plan: Continue prilosec 40 mg daily.   Continue healthy diet and exercise.    Mixed hyperlipidemia Assessment & Plan: Well controlled.  No changes to medicines. Continue crestor 40 mg before bed.  Continue to work on eating a healthy diet and exercise.     Hypertension associated with diabetes (HCC) Assessment & Plan: Continue norvasc 2.5 mg daily, propranolol 60 mg daily and diovan 320 mg daily. Healthy diet and exercise.     Encounter for osteoporosis screening in asymptomatic postmenopausal patient Assessment & Plan: Recently restarted on Fosamax 70 mg daily.        No orders of the defined types were placed in this encounter.   Orders Placed This Encounter  Procedures   Microalbumin / creatinine urine ratio     Follow-up: Return in about 3 months (around 08/27/2023) for chronic, fasting.   I,Katherina A Bramblett,acting as a scribe for Blane Ohara, MD.,have documented all relevant documentation on the behalf of Blane Ohara, MD,as directed by  Blane Ohara, MD while in the presence of Blane Ohara, MD.   An After Visit Summary was printed and given to the patient.  Blane Ohara, MD [facility] [phone]

## 2023-05-27 ENCOUNTER — Ambulatory Visit (INDEPENDENT_AMBULATORY_CARE_PROVIDER_SITE_OTHER): Payer: PPO | Admitting: Family Medicine

## 2023-05-27 ENCOUNTER — Encounter: Payer: Self-pay | Admitting: Family Medicine

## 2023-05-27 VITALS — BP 132/70 | HR 80 | Temp 97.0°F | Resp 14 | Ht 63.0 in | Wt 161.0 lb

## 2023-05-27 DIAGNOSIS — Z13820 Encounter for screening for osteoporosis: Secondary | ICD-10-CM | POA: Diagnosis not present

## 2023-05-27 DIAGNOSIS — M25512 Pain in left shoulder: Secondary | ICD-10-CM | POA: Diagnosis not present

## 2023-05-27 DIAGNOSIS — E782 Mixed hyperlipidemia: Secondary | ICD-10-CM

## 2023-05-27 DIAGNOSIS — E039 Hypothyroidism, unspecified: Secondary | ICD-10-CM | POA: Diagnosis not present

## 2023-05-27 DIAGNOSIS — K219 Gastro-esophageal reflux disease without esophagitis: Secondary | ICD-10-CM | POA: Diagnosis not present

## 2023-05-27 DIAGNOSIS — I152 Hypertension secondary to endocrine disorders: Secondary | ICD-10-CM

## 2023-05-27 DIAGNOSIS — E1065 Type 1 diabetes mellitus with hyperglycemia: Secondary | ICD-10-CM

## 2023-05-27 DIAGNOSIS — E1059 Type 1 diabetes mellitus with other circulatory complications: Secondary | ICD-10-CM

## 2023-05-27 DIAGNOSIS — Z780 Asymptomatic menopausal state: Secondary | ICD-10-CM | POA: Diagnosis not present

## 2023-05-27 DIAGNOSIS — Z96612 Presence of left artificial shoulder joint: Secondary | ICD-10-CM | POA: Diagnosis not present

## 2023-05-27 DIAGNOSIS — F02A18 Dementia in other diseases classified elsewhere, mild, with other behavioral disturbance: Secondary | ICD-10-CM

## 2023-05-27 DIAGNOSIS — Z794 Long term (current) use of insulin: Secondary | ICD-10-CM

## 2023-05-27 DIAGNOSIS — E1159 Type 2 diabetes mellitus with other circulatory complications: Secondary | ICD-10-CM

## 2023-05-27 DIAGNOSIS — R531 Weakness: Secondary | ICD-10-CM | POA: Diagnosis not present

## 2023-05-27 NOTE — Assessment & Plan Note (Signed)
Continue norvasc 2.5 mg daily, propranolol 60 mg daily and diovan 320 mg daily. Healthy diet and exercise.

## 2023-05-27 NOTE — Assessment & Plan Note (Signed)
Continue prilosec 40 mg daily.   Continue healthy diet and exercise.

## 2023-05-27 NOTE — Assessment & Plan Note (Signed)
Continue synthroid 125 mcg daily.   Obtain recent labs from outside provider.

## 2023-05-27 NOTE — Assessment & Plan Note (Signed)
Managed by Endocrinology. Continue Novolog sliding scaled before meals, and at bedtime. Continue Tresiba 19 units daily. Daily foot checks.

## 2023-05-27 NOTE — Assessment & Plan Note (Signed)
Recently restarted on Fosamax 70 mg daily.

## 2023-05-28 ENCOUNTER — Other Ambulatory Visit: Payer: Self-pay | Admitting: Family Medicine

## 2023-05-28 DIAGNOSIS — R413 Other amnesia: Secondary | ICD-10-CM

## 2023-05-28 NOTE — Assessment & Plan Note (Signed)
Well controlled.  No changes to medicines. Continue crestor 40 mg before bed.  Continue to work on eating a healthy diet and exercise.   

## 2023-05-30 ENCOUNTER — Other Ambulatory Visit: Payer: PPO

## 2023-05-30 DIAGNOSIS — I10 Essential (primary) hypertension: Secondary | ICD-10-CM | POA: Diagnosis not present

## 2023-05-30 DIAGNOSIS — E782 Mixed hyperlipidemia: Secondary | ICD-10-CM

## 2023-05-30 DIAGNOSIS — R531 Weakness: Secondary | ICD-10-CM | POA: Diagnosis not present

## 2023-05-30 DIAGNOSIS — Z8673 Personal history of transient ischemic attack (TIA), and cerebral infarction without residual deficits: Secondary | ICD-10-CM | POA: Diagnosis not present

## 2023-05-30 DIAGNOSIS — E039 Hypothyroidism, unspecified: Secondary | ICD-10-CM

## 2023-05-30 DIAGNOSIS — Z6828 Body mass index (BMI) 28.0-28.9, adult: Secondary | ICD-10-CM | POA: Diagnosis not present

## 2023-05-30 DIAGNOSIS — E1159 Type 2 diabetes mellitus with other circulatory complications: Secondary | ICD-10-CM | POA: Diagnosis not present

## 2023-05-30 DIAGNOSIS — E785 Hyperlipidemia, unspecified: Secondary | ICD-10-CM | POA: Diagnosis not present

## 2023-05-30 DIAGNOSIS — Z9181 History of falling: Secondary | ICD-10-CM | POA: Diagnosis not present

## 2023-05-30 DIAGNOSIS — I152 Hypertension secondary to endocrine disorders: Secondary | ICD-10-CM

## 2023-05-30 DIAGNOSIS — M1990 Unspecified osteoarthritis, unspecified site: Secondary | ICD-10-CM | POA: Diagnosis not present

## 2023-05-30 DIAGNOSIS — K219 Gastro-esophageal reflux disease without esophagitis: Secondary | ICD-10-CM | POA: Diagnosis not present

## 2023-05-30 DIAGNOSIS — Z13820 Encounter for screening for osteoporosis: Secondary | ICD-10-CM

## 2023-05-30 DIAGNOSIS — E1065 Type 1 diabetes mellitus with hyperglycemia: Secondary | ICD-10-CM | POA: Diagnosis not present

## 2023-05-30 DIAGNOSIS — E119 Type 2 diabetes mellitus without complications: Secondary | ICD-10-CM | POA: Diagnosis not present

## 2023-05-30 DIAGNOSIS — Z794 Long term (current) use of insulin: Secondary | ICD-10-CM | POA: Diagnosis not present

## 2023-05-30 DIAGNOSIS — Z96612 Presence of left artificial shoulder joint: Secondary | ICD-10-CM | POA: Diagnosis not present

## 2023-05-30 DIAGNOSIS — M25512 Pain in left shoulder: Secondary | ICD-10-CM | POA: Diagnosis not present

## 2023-05-31 LAB — CMP14+EGFR
ALT: 26 IU/L (ref 0–32)
AST: 26 IU/L (ref 0–40)
Albumin: 4.5 g/dL (ref 3.8–4.8)
Alkaline Phosphatase: 144 IU/L — ABNORMAL HIGH (ref 44–121)
BUN/Creatinine Ratio: 19 (ref 12–28)
BUN: 16 mg/dL (ref 8–27)
Bilirubin Total: <0.2 mg/dL (ref 0.0–1.2)
CO2: 27 mmol/L (ref 20–29)
Calcium: 9.9 mg/dL (ref 8.7–10.3)
Chloride: 101 mmol/L (ref 96–106)
Creatinine, Ser: 0.85 mg/dL (ref 0.57–1.00)
Globulin, Total: 2.3 g/dL (ref 1.5–4.5)
Glucose: 88 mg/dL (ref 70–99)
Potassium: 4.8 mmol/L (ref 3.5–5.2)
Sodium: 142 mmol/L (ref 134–144)
Total Protein: 6.8 g/dL (ref 6.0–8.5)
eGFR: 72 mL/min/{1.73_m2} (ref >59)

## 2023-05-31 LAB — CBC WITH DIFFERENTIAL/PLATELET
Basophils Absolute: 0.1 10*3/uL (ref 0.0–0.2)
Basos: 2 %
EOS (ABSOLUTE): 0.4 10*3/uL (ref 0.0–0.4)
Eos: 7 %
Hematocrit: 39.3 % (ref 34.0–46.6)
Hemoglobin: 12.9 g/dL (ref 11.1–15.9)
Immature Grans (Abs): 0 10*3/uL (ref 0.0–0.1)
Immature Granulocytes: 0 %
Lymphocytes Absolute: 1.2 10*3/uL (ref 0.7–3.1)
Lymphs: 22 %
MCH: 30.8 pg (ref 26.6–33.0)
MCHC: 32.8 g/dL (ref 31.5–35.7)
MCV: 94 fL (ref 79–97)
Monocytes Absolute: 0.5 10*3/uL (ref 0.1–0.9)
Monocytes: 9 %
Neutrophils Absolute: 3.3 10*3/uL (ref 1.4–7.0)
Neutrophils: 60 %
Platelets: 302 10*3/uL (ref 150–450)
RBC: 4.19 x10E6/uL (ref 3.77–5.28)
RDW: 11.3 % — ABNORMAL LOW (ref 11.7–15.4)
WBC: 5.5 10*3/uL (ref 3.4–10.8)

## 2023-05-31 LAB — LIPID PANEL
Chol/HDL Ratio: 2.2 ratio (ref 0.0–4.4)
Cholesterol, Total: 208 mg/dL — ABNORMAL HIGH (ref 100–199)
HDL: 93 mg/dL (ref >39)
LDL Chol Calc (NIH): 96 mg/dL (ref 0–99)
Triglycerides: 108 mg/dL (ref 0–149)
VLDL Cholesterol Cal: 19 mg/dL (ref 5–40)

## 2023-05-31 LAB — T4, FREE: Free T4: 1.23 ng/dL (ref 0.82–1.77)

## 2023-05-31 LAB — TSH: TSH: 3.44 u[IU]/mL (ref 0.450–4.500)

## 2023-05-31 LAB — HEMOGLOBIN A1C
Est. average glucose Bld gHb Est-mCnc: 183 mg/dL
Hgb A1c MFr Bld: 8 % — ABNORMAL HIGH (ref 4.8–5.6)

## 2023-06-05 DIAGNOSIS — Z96612 Presence of left artificial shoulder joint: Secondary | ICD-10-CM | POA: Diagnosis not present

## 2023-06-05 DIAGNOSIS — R531 Weakness: Secondary | ICD-10-CM | POA: Diagnosis not present

## 2023-06-05 DIAGNOSIS — M25512 Pain in left shoulder: Secondary | ICD-10-CM | POA: Diagnosis not present

## 2023-06-06 DIAGNOSIS — M25512 Pain in left shoulder: Secondary | ICD-10-CM | POA: Diagnosis not present

## 2023-06-06 DIAGNOSIS — Z96612 Presence of left artificial shoulder joint: Secondary | ICD-10-CM | POA: Diagnosis not present

## 2023-06-06 DIAGNOSIS — R531 Weakness: Secondary | ICD-10-CM | POA: Diagnosis not present

## 2023-06-09 DIAGNOSIS — K760 Fatty (change of) liver, not elsewhere classified: Secondary | ICD-10-CM | POA: Diagnosis not present

## 2023-06-09 DIAGNOSIS — K219 Gastro-esophageal reflux disease without esophagitis: Secondary | ICD-10-CM | POA: Diagnosis not present

## 2023-06-09 DIAGNOSIS — R140 Abdominal distension (gaseous): Secondary | ICD-10-CM | POA: Diagnosis not present

## 2023-06-09 DIAGNOSIS — K649 Unspecified hemorrhoids: Secondary | ICD-10-CM | POA: Diagnosis not present

## 2023-06-09 DIAGNOSIS — K5900 Constipation, unspecified: Secondary | ICD-10-CM | POA: Diagnosis not present

## 2023-06-10 DIAGNOSIS — M25512 Pain in left shoulder: Secondary | ICD-10-CM | POA: Diagnosis not present

## 2023-06-10 DIAGNOSIS — Z96612 Presence of left artificial shoulder joint: Secondary | ICD-10-CM | POA: Diagnosis not present

## 2023-06-10 DIAGNOSIS — R531 Weakness: Secondary | ICD-10-CM | POA: Diagnosis not present

## 2023-06-11 DIAGNOSIS — E1065 Type 1 diabetes mellitus with hyperglycemia: Secondary | ICD-10-CM | POA: Diagnosis not present

## 2023-06-11 DIAGNOSIS — E10319 Type 1 diabetes mellitus with unspecified diabetic retinopathy without macular edema: Secondary | ICD-10-CM | POA: Diagnosis not present

## 2023-06-12 DIAGNOSIS — M25512 Pain in left shoulder: Secondary | ICD-10-CM | POA: Diagnosis not present

## 2023-06-12 DIAGNOSIS — R531 Weakness: Secondary | ICD-10-CM | POA: Diagnosis not present

## 2023-06-12 DIAGNOSIS — Z96612 Presence of left artificial shoulder joint: Secondary | ICD-10-CM | POA: Diagnosis not present

## 2023-06-17 DIAGNOSIS — S0083XA Contusion of other part of head, initial encounter: Secondary | ICD-10-CM | POA: Diagnosis not present

## 2023-06-17 DIAGNOSIS — R0781 Pleurodynia: Secondary | ICD-10-CM | POA: Diagnosis not present

## 2023-06-17 DIAGNOSIS — J9811 Atelectasis: Secondary | ICD-10-CM | POA: Diagnosis not present

## 2023-06-18 ENCOUNTER — Other Ambulatory Visit: Payer: Self-pay | Admitting: Family Medicine

## 2023-06-19 DIAGNOSIS — Z96612 Presence of left artificial shoulder joint: Secondary | ICD-10-CM | POA: Diagnosis not present

## 2023-06-19 DIAGNOSIS — M25512 Pain in left shoulder: Secondary | ICD-10-CM | POA: Diagnosis not present

## 2023-06-19 DIAGNOSIS — R531 Weakness: Secondary | ICD-10-CM | POA: Diagnosis not present

## 2023-06-24 DIAGNOSIS — M25512 Pain in left shoulder: Secondary | ICD-10-CM | POA: Diagnosis not present

## 2023-06-24 DIAGNOSIS — Z96612 Presence of left artificial shoulder joint: Secondary | ICD-10-CM | POA: Diagnosis not present

## 2023-06-24 DIAGNOSIS — R531 Weakness: Secondary | ICD-10-CM | POA: Diagnosis not present

## 2023-06-26 DIAGNOSIS — R531 Weakness: Secondary | ICD-10-CM | POA: Diagnosis not present

## 2023-06-26 DIAGNOSIS — M25512 Pain in left shoulder: Secondary | ICD-10-CM | POA: Diagnosis not present

## 2023-06-26 DIAGNOSIS — Z96612 Presence of left artificial shoulder joint: Secondary | ICD-10-CM | POA: Diagnosis not present

## 2023-07-01 DIAGNOSIS — Z96612 Presence of left artificial shoulder joint: Secondary | ICD-10-CM | POA: Diagnosis not present

## 2023-07-01 DIAGNOSIS — R531 Weakness: Secondary | ICD-10-CM | POA: Diagnosis not present

## 2023-07-01 DIAGNOSIS — M25512 Pain in left shoulder: Secondary | ICD-10-CM | POA: Diagnosis not present

## 2023-07-03 DIAGNOSIS — M25512 Pain in left shoulder: Secondary | ICD-10-CM | POA: Diagnosis not present

## 2023-07-03 DIAGNOSIS — R531 Weakness: Secondary | ICD-10-CM | POA: Diagnosis not present

## 2023-07-03 DIAGNOSIS — Z96612 Presence of left artificial shoulder joint: Secondary | ICD-10-CM | POA: Diagnosis not present

## 2023-07-07 DIAGNOSIS — M25512 Pain in left shoulder: Secondary | ICD-10-CM | POA: Diagnosis not present

## 2023-07-07 DIAGNOSIS — R531 Weakness: Secondary | ICD-10-CM | POA: Diagnosis not present

## 2023-07-07 DIAGNOSIS — Z96612 Presence of left artificial shoulder joint: Secondary | ICD-10-CM | POA: Diagnosis not present

## 2023-07-09 ENCOUNTER — Telehealth: Payer: Self-pay | Admitting: Family Medicine

## 2023-07-09 NOTE — Telephone Encounter (Signed)
PT DROPPED OFF "HEALTH EXAM CERTIFICATE [employer]" PT STATED SHE WAS TOLD TO DROP OFF THE FORMS AND I DID STATE MOST LIKELY SHE WOULD NEED AN APPT FOR FORMS TO BE FILLED OUT. SHE TURNED AROUND MUMBLED LEFT THE OFFICE.

## 2023-07-10 DIAGNOSIS — Z96612 Presence of left artificial shoulder joint: Secondary | ICD-10-CM | POA: Diagnosis not present

## 2023-07-10 DIAGNOSIS — M25512 Pain in left shoulder: Secondary | ICD-10-CM | POA: Diagnosis not present

## 2023-07-10 DIAGNOSIS — R531 Weakness: Secondary | ICD-10-CM | POA: Diagnosis not present

## 2023-07-12 DIAGNOSIS — E10319 Type 1 diabetes mellitus with unspecified diabetic retinopathy without macular edema: Secondary | ICD-10-CM | POA: Diagnosis not present

## 2023-07-12 DIAGNOSIS — E1065 Type 1 diabetes mellitus with hyperglycemia: Secondary | ICD-10-CM | POA: Diagnosis not present

## 2023-07-14 DIAGNOSIS — M25512 Pain in left shoulder: Secondary | ICD-10-CM | POA: Diagnosis not present

## 2023-07-14 DIAGNOSIS — G8929 Other chronic pain: Secondary | ICD-10-CM | POA: Diagnosis not present

## 2023-07-14 DIAGNOSIS — M19012 Primary osteoarthritis, left shoulder: Secondary | ICD-10-CM | POA: Diagnosis not present

## 2023-07-15 DIAGNOSIS — Z96612 Presence of left artificial shoulder joint: Secondary | ICD-10-CM | POA: Diagnosis not present

## 2023-07-15 DIAGNOSIS — M25512 Pain in left shoulder: Secondary | ICD-10-CM | POA: Diagnosis not present

## 2023-07-15 DIAGNOSIS — R531 Weakness: Secondary | ICD-10-CM | POA: Diagnosis not present

## 2023-07-18 DIAGNOSIS — Z96612 Presence of left artificial shoulder joint: Secondary | ICD-10-CM | POA: Diagnosis not present

## 2023-07-18 DIAGNOSIS — R531 Weakness: Secondary | ICD-10-CM | POA: Diagnosis not present

## 2023-07-18 DIAGNOSIS — M25512 Pain in left shoulder: Secondary | ICD-10-CM | POA: Diagnosis not present

## 2023-07-21 ENCOUNTER — Other Ambulatory Visit: Payer: Self-pay | Admitting: Family Medicine

## 2023-07-24 DIAGNOSIS — R531 Weakness: Secondary | ICD-10-CM | POA: Diagnosis not present

## 2023-07-24 DIAGNOSIS — M25512 Pain in left shoulder: Secondary | ICD-10-CM | POA: Diagnosis not present

## 2023-07-24 DIAGNOSIS — Z96612 Presence of left artificial shoulder joint: Secondary | ICD-10-CM | POA: Diagnosis not present

## 2023-07-26 ENCOUNTER — Other Ambulatory Visit: Payer: Self-pay | Admitting: Family Medicine

## 2023-07-26 DIAGNOSIS — E1159 Type 2 diabetes mellitus with other circulatory complications: Secondary | ICD-10-CM

## 2023-07-28 DIAGNOSIS — M19071 Primary osteoarthritis, right ankle and foot: Secondary | ICD-10-CM | POA: Diagnosis not present

## 2023-07-28 DIAGNOSIS — Z96612 Presence of left artificial shoulder joint: Secondary | ICD-10-CM | POA: Diagnosis not present

## 2023-07-28 DIAGNOSIS — M25512 Pain in left shoulder: Secondary | ICD-10-CM | POA: Diagnosis not present

## 2023-07-28 DIAGNOSIS — R531 Weakness: Secondary | ICD-10-CM | POA: Diagnosis not present

## 2023-08-01 DIAGNOSIS — W19XXXA Unspecified fall, initial encounter: Secondary | ICD-10-CM | POA: Diagnosis not present

## 2023-08-01 DIAGNOSIS — Z794 Long term (current) use of insulin: Secondary | ICD-10-CM | POA: Diagnosis not present

## 2023-08-01 DIAGNOSIS — S42412A Displaced simple supracondylar fracture without intercondylar fracture of left humerus, initial encounter for closed fracture: Secondary | ICD-10-CM | POA: Diagnosis not present

## 2023-08-01 DIAGNOSIS — S52572A Other intraarticular fracture of lower end of left radius, initial encounter for closed fracture: Secondary | ICD-10-CM | POA: Diagnosis not present

## 2023-08-01 DIAGNOSIS — M47892 Other spondylosis, cervical region: Secondary | ICD-10-CM | POA: Diagnosis not present

## 2023-08-01 DIAGNOSIS — M47812 Spondylosis without myelopathy or radiculopathy, cervical region: Secondary | ICD-10-CM | POA: Diagnosis not present

## 2023-08-01 DIAGNOSIS — Z743 Need for continuous supervision: Secondary | ICD-10-CM | POA: Diagnosis not present

## 2023-08-01 DIAGNOSIS — I6782 Cerebral ischemia: Secondary | ICD-10-CM | POA: Diagnosis not present

## 2023-08-01 DIAGNOSIS — M4312 Spondylolisthesis, cervical region: Secondary | ICD-10-CM | POA: Diagnosis not present

## 2023-08-01 DIAGNOSIS — M5030 Other cervical disc degeneration, unspecified cervical region: Secondary | ICD-10-CM | POA: Diagnosis not present

## 2023-08-01 DIAGNOSIS — R52 Pain, unspecified: Secondary | ICD-10-CM | POA: Diagnosis not present

## 2023-08-01 DIAGNOSIS — E109 Type 1 diabetes mellitus without complications: Secondary | ICD-10-CM | POA: Diagnosis not present

## 2023-08-05 DIAGNOSIS — S42412D Displaced simple supracondylar fracture without intercondylar fracture of left humerus, subsequent encounter for fracture with routine healing: Secondary | ICD-10-CM | POA: Diagnosis not present

## 2023-08-05 DIAGNOSIS — M25522 Pain in left elbow: Secondary | ICD-10-CM | POA: Diagnosis not present

## 2023-08-06 DIAGNOSIS — J45909 Unspecified asthma, uncomplicated: Secondary | ICD-10-CM | POA: Diagnosis not present

## 2023-08-06 DIAGNOSIS — S42402A Unspecified fracture of lower end of left humerus, initial encounter for closed fracture: Secondary | ICD-10-CM | POA: Diagnosis not present

## 2023-08-06 DIAGNOSIS — E119 Type 2 diabetes mellitus without complications: Secondary | ICD-10-CM | POA: Diagnosis not present

## 2023-08-11 DIAGNOSIS — S42402A Unspecified fracture of lower end of left humerus, initial encounter for closed fracture: Secondary | ICD-10-CM | POA: Diagnosis not present

## 2023-08-11 DIAGNOSIS — S52202A Unspecified fracture of shaft of left ulna, initial encounter for closed fracture: Secondary | ICD-10-CM | POA: Diagnosis not present

## 2023-08-11 DIAGNOSIS — S42352A Displaced comminuted fracture of shaft of humerus, left arm, initial encounter for closed fracture: Secondary | ICD-10-CM | POA: Diagnosis not present

## 2023-08-12 DIAGNOSIS — E10319 Type 1 diabetes mellitus with unspecified diabetic retinopathy without macular edema: Secondary | ICD-10-CM | POA: Diagnosis not present

## 2023-08-12 DIAGNOSIS — S42302A Unspecified fracture of shaft of humerus, left arm, initial encounter for closed fracture: Secondary | ICD-10-CM | POA: Diagnosis not present

## 2023-08-12 DIAGNOSIS — S52502D Unspecified fracture of the lower end of left radius, subsequent encounter for closed fracture with routine healing: Secondary | ICD-10-CM | POA: Diagnosis not present

## 2023-08-12 DIAGNOSIS — E1065 Type 1 diabetes mellitus with hyperglycemia: Secondary | ICD-10-CM | POA: Diagnosis not present

## 2023-08-12 DIAGNOSIS — W19XXXD Unspecified fall, subsequent encounter: Secondary | ICD-10-CM | POA: Diagnosis not present

## 2023-08-13 ENCOUNTER — Other Ambulatory Visit: Payer: Self-pay

## 2023-08-13 ENCOUNTER — Encounter (HOSPITAL_COMMUNITY): Payer: Self-pay | Admitting: Orthopedic Surgery

## 2023-08-13 NOTE — Anesthesia Preprocedure Evaluation (Addendum)
Anesthesia Evaluation  Patient identified by MRN, date of birth, ID band Patient awake    Reviewed: Allergy & Precautions, H&P , NPO status , Patient's Chart, lab work & pertinent test results  Airway Mallampati: III  TM Distance: >3 FB Neck ROM: Full    Dental  (+) Teeth Intact, Dental Advisory Given   Pulmonary asthma (no inhalers) , sleep apnea (noncompliant with CPAP)    Pulmonary exam normal breath sounds clear to auscultation       Cardiovascular hypertension (184/87 preop, doesnt know where she normally runs), Pt. on medications Normal cardiovascular exam Rhythm:Regular Rate:Normal   saw cardiologist Dr. Dulce Sellar on 12/27/22 for preoperative evaluation prior to shoulder surgery. Echo was ordered and done on 12/27/22 and showed normal LV function, EF 60-65%, no regional wall motion abnormalities, grade 1 diastolic dysfunction, normal RV systolic function, normal PASP. No additional testing recommended prior to that procedure. She had a non-ischemic stress echo in November 2019.    Neuro/Psych  PSYCHIATRIC DISORDERS  Depression    CVA, No Residual Symptoms    GI/Hepatic Neg liver ROS,GERD  Controlled and Medicated,,  Endo/Other  diabetes, Poorly Controlled, Type 2, Insulin DependentHypothyroidism  FS 300 this AM in preop Took 3 units short acting this morning Took 1/2 normal dose of long acting last night Last A1c 8.0  Renal/GU negative Renal ROS  negative genitourinary   Musculoskeletal  (+) Arthritis , Osteoarthritis,    Abdominal   Peds negative pediatric ROS (+)  Hematology negative hematology ROS (+)   Anesthesia Other Findings   Reproductive/Obstetrics negative OB ROS                             Anesthesia Physical Anesthesia Plan  ASA: 3  Anesthesia Plan: General and Regional   Post-op Pain Management: Tylenol PO (pre-op)* and Regional block*   Induction: Intravenous  PONV Risk  Score and Plan: 3 and Ondansetron, Dexamethasone and Treatment may vary due to age or medical condition  Airway Management Planned: Oral ETT  Additional Equipment: None  Intra-op Plan:   Post-operative Plan: Extubation in OR  Informed Consent: I have reviewed the patients History and Physical, chart, labs and discussed the procedure including the risks, benefits and alternatives for the proposed anesthesia with the patient or authorized representative who has indicated his/her understanding and acceptance.     Dental advisory given  Plan Discussed with: CRNA  Anesthesia Plan Comments: ( )       Anesthesia Quick Evaluation

## 2023-08-13 NOTE — Progress Notes (Signed)
Anesthesia Chart Review: [name]   Case: [id number] Date/Time: 08/14/23 1256   Procedure: OPEN REDUCTION INTERNAL FIXATION (ORIF) DISTAL HUMERUS FRACTURE WITH OLECRANON OSTEOTOMY (Left)   Anesthesia type: General   Pre-op diagnosis: FRACTURE LEFT DISTAL HUMERUS   [facility] ROOM 03 / [facility]   Surgeons: Myrene Galas, MD       DISCUSSION: Patient is a 72 year old female scheduled for the above procedure. She had a mechanical fall on 08/01/23 and sustained a left humerus fracture.  History includes never smoker, DM1, asthma, HTN, HLD, GERD, hypothyroidism, CVA (12/2020), tremor, OSA. Osteoarthritis (left reverse TSA 03/20/23)  She saw cardiologist Dr. Dulce Sellar on 12/27/22 for preoperative evaluation prior to shoulder surgery. Echo was ordered and done on 12/27/22 and showed normal LV function, EF 60-65%, no regional wall motion abnormalities, grade 1 diastolic dysfunction, normal RV systolic function, normal PASP. No additional testing recommended prior to that procedure. She had a non-ischemic stress echo in November 2019.   Per 05/14/23 endocrinology telemedicine visit with Dr. Tedd Sias, "Type 1 diabetes. Last Hb A1c is now 7.9%. She has a DexCom G6 CGM." [place] increased to 19 units. Novolog increased by 1 unit befpre breakfast and supper.    She is a same day work-up. Anesthesia team to evaluate on the day of surgery.   VS:  BP Readings from Last 3 Encounters:  05/27/23 132/70  04/23/23 120/70  12/27/22 (!) 148/80   Pulse Readings from Last 3 Encounters:  05/27/23 80  04/23/23 88  12/27/22 81    PROVIDERS: Blane Ohara, MD is PCP Carlena Sax, MD is endocrinologist Norman Herrlich, MD is cardiologist Patrcia Dolly, MD is neurologist   LABS: For day of surgery as indicated. Last results in [facility] include: Lab Results  Component Value Date   WBC 5.5 05/30/2023   HGB 12.9 05/30/2023   HCT 39.3 05/30/2023   PLT 302 05/30/2023   GLUCOSE 88 05/30/2023   CHOL 208 (H) 05/30/2023    TRIG 108 05/30/2023   HDL 93 05/30/2023   LDLCALC 96 05/30/2023   ALT 26 05/30/2023   AST 26 05/30/2023   NA 142 05/30/2023   K 4.8 05/30/2023   CL 101 05/30/2023   CREATININE 0.85 05/30/2023   BUN 16 05/30/2023   CO2 27 05/30/2023   TSH 3.440 05/30/2023   HGBA1C 8.0 (H) 05/30/2023     IMAGES: CT C-spine 08/01/23 [facility]): IMPRESSION: No evidence of acute cervical spinal fracture.  Grade 1 anterolisthesis C3 on C4.  Severe multilevel degenerative disc disease and spondylosis.   Xray left elbow 08/01/23 [facility]): IMPRESSION: Comminuted, intra-articular T-shaped intercondylar fracture.   CT Head 08/01/23 [facility]): IMPRESSION: No acute intracranial findings. Small vessel microvascular ischemic change. MRI with diffusion weighted imaging is more sensitive for acute ischemia.   Xray Ribs/PA Chest 06/17/23 ([facility]): Impression:   1.  Deep inspiration.  2.  Minimal left base atelectasis.  3.  No acute left rib fracture.  4.  No pneumothorax.   NM Gastric Emptying Study 08/22/22:  IMPRESSION:  Normal gastric emptying study.   EKG: 10/31/22 [facility]): NSR. Possible LAE.   CV: Echo 01/03/23: IMPRESSIONS   1. Left ventricular ejection fraction, by estimation, is 60 to 65%. The  left ventricle has normal function. The left ventricle has no regional  wall motion abnormalities. Left ventricular diastolic parameters are  consistent with Grade I diastolic  dysfunction (impaired relaxation). GLS -13.3%   2. Right ventricular  systolic function is normal. The right ventricular  size is normal. There is normal pulmonary artery systolic pressure.   3. The mitral valve is normal in structure. No evidence of mitral valve  regurgitation. No evidence of mitral stenosis.   4. The aortic valve is normal in structure. Aortic valve regurgitation is  not visualized. No aortic stenosis is present.   5. The inferior vena cava is normal in size  with greater than 50%  respiratory variability, suggesting right atrial pressure of 3 mmHg.    Stress echo 09/28/18 [facility]; scanned under Media tab, Enc 08/03/19):  - Echo images negative for ischemia - Negative stress echo by EKG, pain, and imaging criteria.      Past Medical History:  Diagnosis Date   Abdominal bloating 08/12/2022   Acquired hypothyroidism 12/23/2021   Acute respiratory failure with hypoxemia 04/20/2014   Asthma 08/14/2015   Ataxia 12/23/2021   Capsulitis of metatarsophalangeal (MTP) joint of left foot 01/22/2018   Cough due to ACE inhibitor 09/30/2021   Daytime somnolence 12/23/2021   Essential hypertension 04/15/2014   External hemorrhoid 11/04/2022   GERD (gastroesophageal reflux disease)    Hearing loss of left ear due to cerumen impaction 10/25/2021   Imbalance 07/11/2022   Lacunar infarction 01/10/2021   right dorsal pons   Left tibial fracture 08/03/2019   Major depressive disorder 01/21/2022   Mixed hyperlipidemia 02/17/2022   Nocturia 09/30/2021   Obstructive sleep apnea    No CPAP   Primary localized osteoarthrosis, lower leg 04/18/2014   Snoring 09/30/2021   Tremor 06/29/2022   Type 1 diabetes mellitus with hyperglycemia 09/30/2021   Urine WBC increased 07/11/2022    Past Surgical History:  Procedure Laterality Date   APPENDECTOMY     APPLICATION OF WOUND VAC Left 08/03/2019   Procedure: Application Of Wound Vac;  Surgeon: Myrene Galas, MD;  Location[facility] OR;  Service: Orthopedics;  Laterality: Left;   CESAREAN SECTION     ESOPHAGOGASTRODUODENOSCOPY (EGD) WITH PROPOFOL N/A 02/02/2016   Procedure: ESOPHAGOGASTRODUODENOSCOPY (EGD) WITH PROPOFOL;  Surgeon: Elnita Maxwell, MD;  Location: [facility] ENDOSCOPY;  Service: Endoscopy;  Laterality: N/A;   HAND SURGERY     JOINT REPLACEMENT Bilateral    Knee    ORIF ANKLE FRACTURE Left 08/03/2019   with wound vac applied    ORIF ANKLE FRACTURE Left 08/03/2019   Procedure: OPEN  REDUCTION INTERNAL FIXATION (ORIF) ANKLE FRACTURE;  Surgeon: Myrene Galas, MD;  Location: [facility] OR;  Service: Orthopedics;  Laterality: Left;   TIBIALIS TENDON TRANSFER / REPAIR     TOTAL SHOULDER REPLACEMENT      MEDICATIONS: No current facility-administered medications for this encounter.    alendronate (FOSAMAX) 70 MG tablet   amLODipine (NORVASC) 2.5 MG tablet   aspirin EC 81 MG tablet   calcium carbonate (OSCAL) 1500 (600 Ca) MG TABS tablet   ferrous sulfate 325 (65 FE) MG tablet   Glucagon (GVOKE HYPOPEN 2-PACK) 0.5 MG/0.1ML SOAJ   HYDROcodone-acetaminophen (NORCO/VICODIN) 5-325 MG tablet   ibuprofen (ADVIL) 600 MG tablet   insulin aspart (NOVOLOG) 100 UNIT/ML injection   insulin degludec (TRESIBA FLEXTOUCH) 100 UNIT/ML FlexTouch Pen   levothyroxine (SYNTHROID) 125 MCG tablet   LORazepam (ATIVAN) 0.5 MG tablet   omeprazole (PRILOSEC) 40 MG capsule   OVER THE COUNTER MEDICATION   oxyCODONE (OXY IR/ROXICODONE) 5 MG immediate release tablet   polyethylene glycol powder (GLYCOLAX/MIRALAX) 17 GM/SCOOP powder   propranolol ER (INDERAL LA) 60 MG 24 hr capsule   psyllium (  METAMUCIL) 58.6 % powder   rosuvastatin (CRESTOR) 40 MG tablet   valsartan (DIOVAN) 320 MG tablet   venlafaxine XR (EFFEXOR-XR) 150 MG 24 hr capsule   vitamin B-12 (CYANOCOBALAMIN) 100 MCG tablet   Vitamin D, Ergocalciferol, (DRISDOL) 1.25 MG (50000 UNIT) CAPS capsule    Shonna Chock, PA-C Surgical Short Stay/Anesthesiology [facility] Phone [phone] [facility] Phone [phone] 08/13/2023 4:21 PM

## 2023-08-13 NOTE — Progress Notes (Addendum)
PCP - Blane Ohara, MD Cardiologist - Baldo Daub, MD   PPM/ICD - denies Device Orders - n/a Rep Notified - n/a  Chest x-ray - 11-01-22 EKG - 10-31-22 Stress Test -  ECHO - 01-03-23 Cardiac Cath -    Home sleep test-10-18-21 CPAP -  no  GLP-1 -  Fasting Blood Sugar - per patient 125 Checks Blood Sugar Dexacom to stomach/  Blood Thinner Instructions: n/a Aspirin Instructions: Instructed her to reach out to surgeons office for further instruction  ERAS Protcol - Clear liquids until 10:10 a.m.  COVID TEST- no  Anesthesia review: yes HTN, OSA< DM  Patient verbally denies any shortness of breath, fever, cough and chest pain during phone call   -------------  SDW INSTRUCTIONS given:  Your procedure is scheduled on Sept. 26, 2024.  Report to [facility] Main Entrance "A" at 10:40 A.M., and check in at the Admitting office.  Call this number if you have problems the morning of surgery:  [phone]   Remember:  Do not eat after midnight the night before your surgery  You may drink clear liquids until 10:10 A.M. the morning of your surgery.   Clear liquids allowed are: Water, Non-Citrus Juices (without pulp), Carbonated Beverages, Clear Tea, Black Coffee Only, and Gatorade    Take these medicines the morning of surgery with A SIP OF WATER  amLODipine (NORVASC)  levothyroxine (SYNTHROID)  omeprazole (PRILOSEC)  propranolol ER (INDERAL LA)  rosuvastatin (CRESTOR)  venlafaxine XR (EFFEXOR-XR)   IF NEEDED HYDROcodone-acetaminophen (NORCO/VICODIN)  LORazepam (ATIVAN)  oxyCODONE (OXY IR/ROXICODONE)    As of today, STOP taking any Aspirin (unless otherwise instructed by your surgeon) Aleve, Naproxen, Ibuprofen, Motrin, Advil, Goody's, BC's, all herbal medications, fish oil, and all vitamins. WHAT DO I DO ABOUT MY DIABETES MEDICATION?          degludec (TRESIBA FLEXTOUCH)Take      1/2 your bedtime dose   THE MORNING OF SURGERY, take 1/2 of usual dose insulin aspart  (NOVOLOG)  if blood sugar greater than 220  The day of surgery, do not take other diabetes injectables, including Byetta (exenatide), Bydureon (exenatide ER), Victoza (liraglutide), or Trulicity (dulaglutide).       HOW TO MANAGE YOUR DIABETES BEFORE AND AFTER SURGERY  Why is it important to control my blood sugar before and after surgery? Improving blood sugar levels before and after surgery helps healing and can limit problems. A way of improving blood sugar control is eating a healthy diet by:  Eating less sugar and carbohydrates  Increasing activity/exercise  Talking with your doctor about reaching your blood sugar goals High blood sugars (greater than 180 mg/dL) can raise your risk of infections and slow your recovery, so you will need to focus on controlling your diabetes during the weeks before surgery. Make sure that the doctor who takes care of your diabetes knows about your planned surgery including the date and location.  How do I manage my blood sugar before surgery? Check your blood sugar at least 4 times a day, starting 2 days before surgery, to make sure that the level is not too high or low.  Check your blood sugar the morning of your surgery when you wake up and every 2 hours until you get to the Short Stay unit.  If your blood sugar is less than 70 mg/dL, you will need to treat for low blood sugar: Do not take insulin. Treat a low blood sugar (less than 70 mg/dL) with  cup of  clear juice (cranberry or apple), 4 glucose tablets, OR glucose gel. Recheck blood sugar in 15 minutes after treatment (to make sure it is greater than 70 mg/dL). If your blood sugar is not greater than 70 mg/dL on recheck, call [phone] for further instructions. Report your blood sugar to the short stay nurse when you get to Short Stay.  If you are admitted to the hospital after surgery: Your blood sugar will be checked by the staff and you will probably be given insulin after surgery  (instead of oral diabetes medicines) to make sure you have good blood sugar levels. The goal for blood sugar control after surgery is 80-180 mg/dL.                      Do not wear jewelry, make up, or nail polish            Do not wear lotions, powders, perfumes/colognes, or deodorant.            Do not shave 48 hours prior to surgery.  Men may shave face and neck.            Do not bring valuables to the hospital.            [facility] is not responsible for any belongings or valuables.  Do NOT Smoke (Tobacco/Vaping) 24 hours prior to your procedure If you use a CPAP at night, you may bring all equipment for your overnight stay.   Contacts, glasses, dentures or bridgework may not be worn into surgery.      For patients admitted to the hospital, discharge time will be determined by your treatment team.   Patients discharged the day of surgery will not be allowed to drive home, and someone needs to stay with them for 24 hours.    Special instructions:   - Preparing For Surgery  Before surgery, you can play an important role. Because skin is not sterile, your skin needs to be as free of germs as possible. You can reduce the number of germs on your skin by washing with CHG (chlorahexidine gluconate) Soap before surgery.  CHG is an antiseptic cleaner which kills germs and bonds with the skin to continue killing germs even after washing.    Oral Hygiene is also important to reduce your risk of infection.  Remember - BRUSH YOUR TEETH THE MORNING OF SURGERY WITH YOUR REGULAR TOOTHPASTE  Please do not use if you have an allergy to CHG or antibacterial soaps. If your skin becomes reddened/irritated stop using the CHG.  Do not shave (including legs and underarms) for at least 48 hours prior to first CHG shower. It is OK to shave your face.  Please follow these instructions carefully.   Shower the NIGHT BEFORE SURGERY and the MORNING OF SURGERY with DIAL Soap.   Pat yourself dry  with a CLEAN TOWEL.  Wear CLEAN PAJAMAS to bed the night before surgery  Place CLEAN SHEETS on your bed the night of your first shower and DO NOT SLEEP WITH PETS.   Day of Surgery: Please shower morning of surgery  Wear Clean/Comfortable clothing the morning of surgery Do not apply any deodorants/lotions.   Remember to brush your teeth WITH YOUR REGULAR TOOTHPASTE.   Questions were answered. Patient verbalized understanding of instructions.

## 2023-08-14 ENCOUNTER — Ambulatory Visit (HOSPITAL_COMMUNITY): Payer: PPO

## 2023-08-14 ENCOUNTER — Ambulatory Visit (HOSPITAL_BASED_OUTPATIENT_CLINIC_OR_DEPARTMENT_OTHER): Payer: PPO | Admitting: Vascular Surgery

## 2023-08-14 ENCOUNTER — Other Ambulatory Visit: Payer: Self-pay

## 2023-08-14 ENCOUNTER — Encounter (HOSPITAL_COMMUNITY)
Admission: RE | Disposition: A | Discharge status: Home / Self Care | Payer: Self-pay | Source: Home / Self Care | Attending: Orthopedic Surgery

## 2023-08-14 ENCOUNTER — Ambulatory Visit (HOSPITAL_COMMUNITY)
Admission: RE | Admit: 2023-08-14 | Discharge: 2023-08-16 | Disposition: A | Discharge status: Home / Self Care | Payer: PPO | Attending: Orthopedic Surgery | Admitting: Orthopedic Surgery

## 2023-08-14 ENCOUNTER — Ambulatory Visit (HOSPITAL_COMMUNITY): Payer: Self-pay | Admitting: Vascular Surgery

## 2023-08-14 ENCOUNTER — Encounter (HOSPITAL_COMMUNITY): Payer: Self-pay | Admitting: Orthopedic Surgery

## 2023-08-14 DIAGNOSIS — E1065 Type 1 diabetes mellitus with hyperglycemia: Secondary | ICD-10-CM

## 2023-08-14 DIAGNOSIS — G4733 Obstructive sleep apnea (adult) (pediatric): Secondary | ICD-10-CM | POA: Diagnosis not present

## 2023-08-14 DIAGNOSIS — M810 Age-related osteoporosis without current pathological fracture: Secondary | ICD-10-CM | POA: Diagnosis not present

## 2023-08-14 DIAGNOSIS — E109 Type 1 diabetes mellitus without complications: Secondary | ICD-10-CM | POA: Diagnosis not present

## 2023-08-14 DIAGNOSIS — X58XXXA Exposure to other specified factors, initial encounter: Secondary | ICD-10-CM | POA: Diagnosis not present

## 2023-08-14 DIAGNOSIS — I10 Essential (primary) hypertension: Secondary | ICD-10-CM | POA: Diagnosis not present

## 2023-08-14 DIAGNOSIS — E039 Hypothyroidism, unspecified: Secondary | ICD-10-CM | POA: Insufficient documentation

## 2023-08-14 DIAGNOSIS — K219 Gastro-esophageal reflux disease without esophagitis: Secondary | ICD-10-CM | POA: Insufficient documentation

## 2023-08-14 DIAGNOSIS — M1990 Unspecified osteoarthritis, unspecified site: Secondary | ICD-10-CM | POA: Insufficient documentation

## 2023-08-14 DIAGNOSIS — Z794 Long term (current) use of insulin: Secondary | ICD-10-CM | POA: Diagnosis not present

## 2023-08-14 DIAGNOSIS — S42352A Displaced comminuted fracture of shaft of humerus, left arm, initial encounter for closed fracture: Secondary | ICD-10-CM | POA: Diagnosis not present

## 2023-08-14 DIAGNOSIS — J45909 Unspecified asthma, uncomplicated: Secondary | ICD-10-CM | POA: Diagnosis not present

## 2023-08-14 DIAGNOSIS — Z79890 Hormone replacement therapy (postmenopausal): Secondary | ICD-10-CM | POA: Diagnosis not present

## 2023-08-14 DIAGNOSIS — S42492A Other displaced fracture of lower end of left humerus, initial encounter for closed fracture: Secondary | ICD-10-CM

## 2023-08-14 DIAGNOSIS — S42402A Unspecified fracture of lower end of left humerus, initial encounter for closed fracture: Secondary | ICD-10-CM | POA: Diagnosis present

## 2023-08-14 DIAGNOSIS — S42202A Unspecified fracture of upper end of left humerus, initial encounter for closed fracture: Secondary | ICD-10-CM | POA: Diagnosis not present

## 2023-08-14 DIAGNOSIS — Z01818 Encounter for other preprocedural examination: Secondary | ICD-10-CM

## 2023-08-14 DIAGNOSIS — Z13820 Encounter for screening for osteoporosis: Secondary | ICD-10-CM

## 2023-08-14 DIAGNOSIS — G4730 Sleep apnea, unspecified: Secondary | ICD-10-CM | POA: Insufficient documentation

## 2023-08-14 DIAGNOSIS — S42472A Displaced transcondylar fracture of left humerus, initial encounter for closed fracture: Secondary | ICD-10-CM | POA: Insufficient documentation

## 2023-08-14 HISTORY — PX: ORIF HUMERUS FRACTURE: SHX2126

## 2023-08-14 LAB — COMPREHENSIVE METABOLIC PANEL
ALT: 21 U/L (ref 0–44)
AST: 19 U/L (ref 15–41)
Albumin: 3.4 g/dL — ABNORMAL LOW (ref 3.5–5.0)
Alkaline Phosphatase: 122 U/L (ref 38–126)
Anion gap: 10 (ref 5–15)
BUN: 15 mg/dL (ref 8–23)
CO2: 25 mmol/L (ref 22–32)
Calcium: 8.7 mg/dL — ABNORMAL LOW (ref 8.9–10.3)
Chloride: 98 mmol/L (ref 98–111)
Creatinine, Ser: 0.82 mg/dL (ref 0.44–1.00)
GFR, Estimated: >60 mL/min (ref >60)
Glucose, Bld: 262 mg/dL — ABNORMAL HIGH (ref 70–99)
Potassium: 3.9 mmol/L (ref 3.5–5.1)
Sodium: 133 mmol/L — ABNORMAL LOW (ref 135–145)
Total Bilirubin: 0.6 mg/dL (ref 0.3–1.2)
Total Protein: 6.4 g/dL — ABNORMAL LOW (ref 6.5–8.1)

## 2023-08-14 LAB — CBC WITH DIFFERENTIAL/PLATELET
Abs Immature Granulocytes: 0.02 10*3/uL (ref 0.00–0.07)
Basophils Absolute: 0.1 10*3/uL (ref 0.0–0.1)
Basophils Relative: 1 %
Eosinophils Absolute: 0.3 10*3/uL (ref 0.0–0.5)
Eosinophils Relative: 6 %
HCT: 33.9 % — ABNORMAL LOW (ref 36.0–46.0)
Hemoglobin: 11.2 g/dL — ABNORMAL LOW (ref 12.0–15.0)
Immature Granulocytes: 0 %
Lymphocytes Relative: 16 %
Lymphs Abs: 1 10*3/uL (ref 0.7–4.0)
MCH: 31.8 pg (ref 26.0–34.0)
MCHC: 33 g/dL (ref 30.0–36.0)
MCV: 96.3 fL (ref 80.0–100.0)
Monocytes Absolute: 0.5 10*3/uL (ref 0.1–1.0)
Monocytes Relative: 8 %
Neutro Abs: 4.2 10*3/uL (ref 1.7–7.7)
Neutrophils Relative %: 69 %
Platelets: 341 10*3/uL (ref 150–400)
RBC: 3.52 MIL/uL — ABNORMAL LOW (ref 3.87–5.11)
RDW: 11.9 % (ref 11.5–15.5)
WBC: 6.2 10*3/uL (ref 4.0–10.5)
nRBC: 0 % (ref 0.0–0.2)

## 2023-08-14 LAB — URINALYSIS, ROUTINE W REFLEX MICROSCOPIC
Bilirubin Urine: NEGATIVE
Glucose, UA: 50 mg/dL — AB
Hgb urine dipstick: NEGATIVE
Ketones, ur: 5 mg/dL — AB
Leukocytes,Ua: NEGATIVE
Nitrite: NEGATIVE
Protein, ur: NEGATIVE mg/dL
Specific Gravity, Urine: 1.014 (ref 1.005–1.030)
pH: 6 (ref 5.0–8.0)

## 2023-08-14 LAB — GLUCOSE, CAPILLARY
Glucose-Capillary: 300 mg/dL — ABNORMAL HIGH (ref 70–99)
Glucose-Capillary: 151 mg/dL — ABNORMAL HIGH (ref 70–99)
Glucose-Capillary: 159 mg/dL — ABNORMAL HIGH (ref 70–99)
Glucose-Capillary: 282 mg/dL — ABNORMAL HIGH (ref 70–99)
Glucose-Capillary: 291 mg/dL — ABNORMAL HIGH (ref 70–99)

## 2023-08-14 LAB — SURGICAL PCR SCREEN
MRSA, PCR: NEGATIVE
Staphylococcus aureus: NEGATIVE

## 2023-08-14 LAB — VITAMIN D 25 HYDROXY (VIT D DEFICIENCY, FRACTURES): Vit D, 25-Hydroxy: 107.92 ng/mL — ABNORMAL HIGH (ref 30–100)

## 2023-08-14 LAB — PROTIME-INR
INR: 0.9 (ref 0.8–1.2)
Prothrombin Time: 12.5 seconds (ref 11.4–15.2)

## 2023-08-14 SURGERY — OPEN REDUCTION INTERNAL FIXATION (ORIF) DISTAL HUMERUS FRACTURE
Anesthesia: General | Site: Elbow | Laterality: Left

## 2023-08-14 MED ORDER — LORAZEPAM 0.5 MG PO TABS
0.5000 mg | ORAL_TABLET | Freq: Every day | ORAL | Status: DC | PRN
  Filled 2023-08-14: qty 1

## 2023-08-14 MED ORDER — PHENYLEPHRINE 80 MCG/ML (10ML) SYRINGE FOR IV PUSH (FOR BLOOD PRESSURE SUPPORT)
PREFILLED_SYRINGE | INTRAVENOUS | Status: AC
  Filled 2023-08-14: qty 20

## 2023-08-14 MED ORDER — INSULIN ASPART 100 UNIT/ML IJ SOLN
0.0000 [IU] | INTRAMUSCULAR | Status: AC | PRN
  Administered 2023-08-14: 5 [IU] via SUBCUTANEOUS
  Administered 2023-08-14: 4 [IU] via SUBCUTANEOUS
  Filled 2023-08-14: qty 1

## 2023-08-14 MED ORDER — SUGAMMADEX SODIUM 200 MG/2ML IV SOLN
INTRAVENOUS | Status: DC | PRN
  Administered 2023-08-14: 300 mg via INTRAVENOUS

## 2023-08-14 MED ORDER — ROCURONIUM BROMIDE 10 MG/ML (PF) SYRINGE
PREFILLED_SYRINGE | INTRAVENOUS | Status: DC | PRN
  Administered 2023-08-14: 70 mg via INTRAVENOUS
  Administered 2023-08-14: 20 mg via INTRAVENOUS

## 2023-08-14 MED ORDER — FENTANYL CITRATE (PF) 100 MCG/2ML IJ SOLN: 100.0000 ug | Freq: Once | INTRAMUSCULAR | Status: AC

## 2023-08-14 MED ORDER — INSULIN DEGLUDEC 100 UNIT/ML ~~LOC~~ SOPN: 19.0000 [IU] | PEN_INJECTOR | Freq: Every day | SUBCUTANEOUS | Status: DC

## 2023-08-14 MED ORDER — INSULIN ASPART 100 UNIT/ML IJ SOLN: 4.0000 [IU] | Freq: Three times a day (TID) | INTRAMUSCULAR | Status: DC

## 2023-08-14 MED ORDER — VENLAFAXINE HCL ER 150 MG PO CP24
150.0000 mg | ORAL_CAPSULE | Freq: Every day | ORAL | Status: DC
  Administered 2023-08-15 – 2023-08-16 (×2): 150 mg via ORAL
  Filled 2023-08-14 (×2): qty 1

## 2023-08-14 MED ORDER — LACTATED RINGERS IV SOLN: INTRAVENOUS | Status: DC

## 2023-08-14 MED ORDER — LIDOCAINE 2% (20 MG/ML) 5 ML SYRINGE
INTRAMUSCULAR | Status: AC
  Filled 2023-08-14: qty 5

## 2023-08-14 MED ORDER — LEVOTHYROXINE SODIUM 25 MCG PO TABS
125.0000 ug | ORAL_TABLET | Freq: Every day | ORAL | Status: DC
  Administered 2023-08-15 – 2023-08-16 (×2): 125 ug via ORAL
  Filled 2023-08-14 (×2): qty 1

## 2023-08-14 MED ORDER — ACETAMINOPHEN 325 MG PO TABS
325.0000 mg | ORAL_TABLET | Freq: Four times a day (QID) | ORAL | Status: DC | PRN
  Administered 2023-08-15: 650 mg via ORAL
  Filled 2023-08-14: qty 2

## 2023-08-14 MED ORDER — ORAL CARE MOUTH RINSE: 15.0000 mL | Freq: Once | OROMUCOSAL | Status: AC

## 2023-08-14 MED ORDER — ONDANSETRON HCL 4 MG/2ML IJ SOLN
INTRAMUSCULAR | Status: DC | PRN
  Administered 2023-08-14: 4 mg via INTRAVENOUS

## 2023-08-14 MED ORDER — ONDANSETRON HCL 4 MG/2ML IJ SOLN: 4.0000 mg | Freq: Once | INTRAMUSCULAR | Status: DC | PRN

## 2023-08-14 MED ORDER — CEFAZOLIN SODIUM-DEXTROSE 2-4 GM/100ML-% IV SOLN
2.0000 g | INTRAVENOUS | Status: AC
  Administered 2023-08-14: 2 g via INTRAVENOUS
  Filled 2023-08-14: qty 100

## 2023-08-14 MED ORDER — METOCLOPRAMIDE HCL 5 MG/ML IJ SOLN: 5.0000 mg | Freq: Three times a day (TID) | INTRAMUSCULAR | Status: DC | PRN

## 2023-08-14 MED ORDER — CALCIUM CARBONATE 1250 (500 CA) MG PO TABS
500.0000 mg | ORAL_TABLET | Freq: Every day | ORAL | Status: DC
  Administered 2023-08-15 – 2023-08-16 (×2): 1250 mg via ORAL
  Filled 2023-08-14 (×2): qty 1

## 2023-08-14 MED ORDER — FENTANYL CITRATE (PF) 250 MCG/5ML IJ SOLN
INTRAMUSCULAR | Status: DC | PRN
  Administered 2023-08-14 (×2): 50 ug via INTRAVENOUS

## 2023-08-14 MED ORDER — PROPOFOL 10 MG/ML IV BOLUS
INTRAVENOUS | Status: DC | PRN
Start: 2023-08-14 — End: 2023-08-14
  Administered 2023-08-14: 100 mg via INTRAVENOUS

## 2023-08-14 MED ORDER — DEXAMETHASONE SODIUM PHOSPHATE 10 MG/ML IJ SOLN
INTRAMUSCULAR | Status: AC
  Filled 2023-08-14: qty 1

## 2023-08-14 MED ORDER — 0.9 % SODIUM CHLORIDE (POUR BTL) OPTIME
TOPICAL | Status: DC | PRN
  Administered 2023-08-14: 1000 mL

## 2023-08-14 MED ORDER — FENTANYL CITRATE (PF) 100 MCG/2ML IJ SOLN
25.0000 ug | INTRAMUSCULAR | Status: DC | PRN
  Administered 2023-08-14 (×3): 25 ug via INTRAVENOUS
  Administered 2023-08-14: 50 ug via INTRAVENOUS
  Administered 2023-08-14: 25 ug via INTRAVENOUS

## 2023-08-14 MED ORDER — AMLODIPINE BESYLATE 2.5 MG PO TABS
2.5000 mg | ORAL_TABLET | Freq: Every day | ORAL | Status: DC
  Administered 2023-08-15 – 2023-08-16 (×2): 2.5 mg via ORAL
  Filled 2023-08-14 (×2): qty 1

## 2023-08-14 MED ORDER — ONDANSETRON HCL 4 MG/2ML IJ SOLN: 4.0000 mg | Freq: Four times a day (QID) | INTRAMUSCULAR | Status: DC | PRN

## 2023-08-14 MED ORDER — PHENOL 1.4 % MT LIQD: 1.0000 | OROMUCOSAL | Status: DC | PRN

## 2023-08-14 MED ORDER — PROPRANOLOL HCL ER 60 MG PO CP24
60.0000 mg | ORAL_CAPSULE | Freq: Every day | ORAL | Status: DC
  Administered 2023-08-15 – 2023-08-16 (×2): 60 mg via ORAL
  Filled 2023-08-14 (×2): qty 1

## 2023-08-14 MED ORDER — LIDOCAINE 2% (20 MG/ML) 5 ML SYRINGE
INTRAMUSCULAR | Status: DC | PRN
  Administered 2023-08-14: 60 mg via INTRAVENOUS

## 2023-08-14 MED ORDER — KETAMINE HCL 50 MG/5ML IJ SOSY
PREFILLED_SYRINGE | INTRAMUSCULAR | Status: AC
  Filled 2023-08-14: qty 5

## 2023-08-14 MED ORDER — DOCUSATE SODIUM 100 MG PO CAPS
100.0000 mg | ORAL_CAPSULE | Freq: Two times a day (BID) | ORAL | Status: DC
  Administered 2023-08-15 – 2023-08-16 (×3): 100 mg via ORAL
  Filled 2023-08-14 (×3): qty 1

## 2023-08-14 MED ORDER — ACETAMINOPHEN 500 MG PO TABS
1000.0000 mg | ORAL_TABLET | Freq: Four times a day (QID) | ORAL | Status: DC
  Administered 2023-08-14 – 2023-08-15 (×2): 1000 mg via ORAL
  Filled 2023-08-14 (×2): qty 2

## 2023-08-14 MED ORDER — METOCLOPRAMIDE HCL 5 MG PO TABS: 5.0000 mg | ORAL_TABLET | Freq: Three times a day (TID) | ORAL | Status: DC | PRN

## 2023-08-14 MED ORDER — METHOCARBAMOL 1000 MG/10ML IJ SOLN: 500.0000 mg | Freq: Four times a day (QID) | INTRAVENOUS | Status: DC | PRN

## 2023-08-14 MED ORDER — MIDAZOLAM HCL 2 MG/2ML IJ SOLN
INTRAMUSCULAR | Status: AC
  Filled 2023-08-14: qty 2

## 2023-08-14 MED ORDER — OXYCODONE HCL 5 MG PO TABS: 5.0000 mg | ORAL_TABLET | Freq: Once | ORAL | Status: DC | PRN

## 2023-08-14 MED ORDER — AMISULPRIDE (ANTIEMETIC) 5 MG/2ML IV SOLN: 10.0000 mg | Freq: Once | INTRAVENOUS | Status: DC | PRN

## 2023-08-14 MED ORDER — DEXAMETHASONE SODIUM PHOSPHATE 10 MG/ML IJ SOLN
INTRAMUSCULAR | Status: DC | PRN
  Administered 2023-08-14: 10 mg via INTRAVENOUS

## 2023-08-14 MED ORDER — FENTANYL CITRATE (PF) 100 MCG/2ML IJ SOLN
INTRAMUSCULAR | Status: AC
  Filled 2023-08-14: qty 2

## 2023-08-14 MED ORDER — ASPIRIN 81 MG PO TBEC
81.0000 mg | DELAYED_RELEASE_TABLET | Freq: Every day | ORAL | Status: DC
  Administered 2023-08-15 – 2023-08-16 (×2): 81 mg via ORAL
  Filled 2023-08-14 (×2): qty 1

## 2023-08-14 MED ORDER — DEXTROSE 50 % IV SOLN
INTRAVENOUS | Status: AC
  Filled 2023-08-14: qty 50

## 2023-08-14 MED ORDER — GLUCAGON 0.5 MG/0.1ML ~~LOC~~ SOAJ: 0.5000 mg | Freq: Every day | SUBCUTANEOUS | Status: DC

## 2023-08-14 MED ORDER — INSULIN GLARGINE-YFGN 100 UNIT/ML ~~LOC~~ SOLN
19.0000 [IU] | Freq: Every day | SUBCUTANEOUS | Status: DC
  Administered 2023-08-15 (×2): 19 [IU] via SUBCUTANEOUS
  Filled 2023-08-14 (×3): qty 0.19

## 2023-08-14 MED ORDER — TRANEXAMIC ACID-NACL 1000-0.7 MG/100ML-% IV SOLN
1000.0000 mg | INTRAVENOUS | Status: AC
  Administered 2023-08-14: 1000 mg via INTRAVENOUS
  Filled 2023-08-14: qty 100

## 2023-08-14 MED ORDER — PANTOPRAZOLE SODIUM 40 MG PO TBEC
40.0000 mg | DELAYED_RELEASE_TABLET | Freq: Every day | ORAL | Status: DC
  Administered 2023-08-15 – 2023-08-16 (×2): 40 mg via ORAL
  Filled 2023-08-14 (×2): qty 1

## 2023-08-14 MED ORDER — OXYCODONE HCL 5 MG PO TABS
5.0000 mg | ORAL_TABLET | ORAL | Status: DC | PRN
  Administered 2023-08-15: 10 mg via ORAL
  Filled 2023-08-14: qty 2

## 2023-08-14 MED ORDER — ONDANSETRON HCL 4 MG PO TABS: 4.0000 mg | ORAL_TABLET | Freq: Four times a day (QID) | ORAL | Status: DC | PRN

## 2023-08-14 MED ORDER — CEFAZOLIN SODIUM-DEXTROSE 1-4 GM/50ML-% IV SOLN
1.0000 g | Freq: Four times a day (QID) | INTRAVENOUS | Status: AC
  Administered 2023-08-14 – 2023-08-15 (×3): 1 g via INTRAVENOUS
  Filled 2023-08-14 (×3): qty 50

## 2023-08-14 MED ORDER — METHOCARBAMOL 500 MG PO TABS
500.0000 mg | ORAL_TABLET | Freq: Four times a day (QID) | ORAL | Status: DC | PRN
  Administered 2023-08-15 (×2): 500 mg via ORAL
  Filled 2023-08-14 (×2): qty 1

## 2023-08-14 MED ORDER — PHENYLEPHRINE 80 MCG/ML (10ML) SYRINGE FOR IV PUSH (FOR BLOOD PRESSURE SUPPORT)
PREFILLED_SYRINGE | INTRAVENOUS | Status: DC | PRN
  Administered 2023-08-14 (×9): 80 ug via INTRAVENOUS

## 2023-08-14 MED ORDER — MENTHOL 3 MG MT LOZG: 1.0000 | LOZENGE | OROMUCOSAL | Status: DC | PRN

## 2023-08-14 MED ORDER — CHLORHEXIDINE GLUCONATE 4 % EX SOLN: 60.0000 mL | Freq: Once | CUTANEOUS | Status: DC

## 2023-08-14 MED ORDER — HYDROMORPHONE HCL 1 MG/ML IJ SOLN
0.5000 mg | INTRAMUSCULAR | Status: DC | PRN
  Administered 2023-08-15 – 2023-08-16 (×3): 1 mg via INTRAVENOUS
  Filled 2023-08-14 (×3): qty 1

## 2023-08-14 MED ORDER — PROPOFOL 10 MG/ML IV BOLUS
INTRAVENOUS | Status: AC
  Filled 2023-08-14: qty 20

## 2023-08-14 MED ORDER — MIDAZOLAM HCL 2 MG/2ML IJ SOLN: 2.0000 mg | Freq: Once | INTRAMUSCULAR | Status: AC

## 2023-08-14 MED ORDER — POVIDONE-IODINE 10 % EX SWAB
2.0000 | Freq: Once | CUTANEOUS | Status: AC
  Administered 2023-08-14: 2 via TOPICAL

## 2023-08-14 MED ORDER — CHLORHEXIDINE GLUCONATE 0.12 % MT SOLN
15.0000 mL | Freq: Once | OROMUCOSAL | Status: AC
  Administered 2023-08-14: 15 mL via OROMUCOSAL
  Filled 2023-08-14: qty 15

## 2023-08-14 MED ORDER — FENTANYL CITRATE (PF) 100 MCG/2ML IJ SOLN
INTRAMUSCULAR | Status: AC
  Administered 2023-08-14: 100 ug via INTRAVENOUS
  Filled 2023-08-14: qty 2

## 2023-08-14 MED ORDER — OXYCODONE HCL 5 MG/5ML PO SOLN: 5.0000 mg | Freq: Once | ORAL | Status: DC | PRN

## 2023-08-14 MED ORDER — ROCURONIUM BROMIDE 10 MG/ML (PF) SYRINGE
PREFILLED_SYRINGE | INTRAVENOUS | Status: AC
  Filled 2023-08-14: qty 10

## 2023-08-14 MED ORDER — DEXTROSE 50 % IV SOLN
INTRAVENOUS | Status: DC | PRN
  Administered 2023-08-14: 12 mL via INTRAVENOUS

## 2023-08-14 MED ORDER — FENTANYL CITRATE (PF) 250 MCG/5ML IJ SOLN
INTRAMUSCULAR | Status: AC
  Filled 2023-08-14: qty 5

## 2023-08-14 MED ORDER — INSULIN ASPART 100 UNIT/ML IJ SOLN: 0.0000 [IU] | Freq: Three times a day (TID) | INTRAMUSCULAR | Status: DC

## 2023-08-14 MED ORDER — MIDAZOLAM HCL 2 MG/2ML IJ SOLN
INTRAMUSCULAR | Status: AC
  Administered 2023-08-14: 2 mg via INTRAVENOUS
  Filled 2023-08-14: qty 2

## 2023-08-14 SURGICAL SUPPLY — 109 items
BAG COUNTER SPONGE SURGICOUNT (BAG) ×2 IMPLANT
BAG SPNG CNTER NS LX DISP (BAG) ×1
BIT DRILL 2.3 120MM (DRILL) IMPLANT
BIT DRILL 2.5X110 QC LCP DISP (BIT) IMPLANT
BIT DRILL LCP QC 2X140 (BIT) IMPLANT
BLADE AVERAGE 25X9 (BLADE) IMPLANT
BLADE LONG MED 31X9 (MISCELLANEOUS) IMPLANT
BLADE SURG 10 STRL SS (BLADE) IMPLANT
BNDG CMPR 9X4 STRL LF SNTH (GAUZE/BANDAGES/DRESSINGS)
BNDG CMPR MED 10X6 ELC LF (GAUZE/BANDAGES/DRESSINGS) ×2
BNDG COHESIVE 4X5 TAN STRL (GAUZE/BANDAGES/DRESSINGS) ×2 IMPLANT
BNDG ELASTIC 6X10 VLCR STRL LF (GAUZE/BANDAGES/DRESSINGS) IMPLANT
BNDG ESMARK 4X9 LF (GAUZE/BANDAGES/DRESSINGS) IMPLANT
BNDG GAUZE DERMACEA FLUFF 4 (GAUZE/BANDAGES/DRESSINGS) ×4 IMPLANT
BNDG GZE DERMACEA 4 6PLY (GAUZE/BANDAGES/DRESSINGS)
BRUSH SCRUB EZ PLAIN DRY (MISCELLANEOUS) ×4 IMPLANT
CORD BIPOLAR FORCEPS 12FT (ELECTRODE) IMPLANT
COVER MAYO STAND STRL (DRAPES) IMPLANT
COVER SURGICAL LIGHT HANDLE (MISCELLANEOUS) ×2 IMPLANT
DRAIN PENROSE 0.25X18 (DRAIN) IMPLANT
DRAIN PENROSE 12X.25 LTX STRL (MISCELLANEOUS) IMPLANT
DRAPE C-ARM 42X72 X-RAY (DRAPES) ×2 IMPLANT
DRAPE C-ARMOR (DRAPES) IMPLANT
DRAPE HALF SHEET 40X57 (DRAPES) IMPLANT
DRAPE INCISE IOBAN 66X45 STRL (DRAPES) IMPLANT
DRAPE ORTHO SPLIT 77X108 STRL (DRAPES) ×1
DRAPE SURG ORHT 6 SPLT 77X108 (DRAPES) ×2 IMPLANT
DRAPE U-SHAPE 47X51 STRL (DRAPES) ×4 IMPLANT
DRILL 2.3 120MM AKA DRILL 2.3L (DRILL) ×1
DRSG ADAPTIC 3X8 NADH LF (GAUZE/BANDAGES/DRESSINGS) ×2 IMPLANT
DRSG MEPILEX POST OP 4X8 (GAUZE/BANDAGES/DRESSINGS) IMPLANT
DRSG MEPITEL 4X7.2 (GAUZE/BANDAGES/DRESSINGS) ×2 IMPLANT
DRSG TUBE GAUZE 1X5YD SZ2 (GAUZE/BANDAGES/DRESSINGS) IMPLANT
ELECT REM PT RETURN 9FT ADLT (ELECTROSURGICAL) ×1
ELECTRODE REM PT RTRN 9FT ADLT (ELECTROSURGICAL) ×2 IMPLANT
EVACUATOR 1/8 PVC DRAIN (DRAIN) IMPLANT
GAUZE PAD ABD 8X10 STRL (GAUZE/BANDAGES/DRESSINGS) ×2 IMPLANT
GAUZE SPONGE 4X4 12PLY STRL (GAUZE/BANDAGES/DRESSINGS) ×4 IMPLANT
GLOVE BIO SURGEON STRL SZ7.5 (GLOVE) ×2 IMPLANT
GLOVE BIO SURGEON STRL SZ8 (GLOVE) IMPLANT
GLOVE BIOGEL PI IND STRL 7.0 (GLOVE) IMPLANT
GLOVE BIOGEL PI IND STRL 7.5 (GLOVE) ×2 IMPLANT
GLOVE BIOGEL PI IND STRL 8 (GLOVE) ×2 IMPLANT
GLOVE ORTHO TXT STRL SZ7.5 (GLOVE) IMPLANT
GLOVE SURG ORTHO LTX SZ7.5 (GLOVE) ×4 IMPLANT
GOWN STRL REIN 3XL LVL4 (GOWN DISPOSABLE) IMPLANT
GOWN STRL REUS W/ TWL LRG LVL3 (GOWN DISPOSABLE) ×2 IMPLANT
GOWN STRL REUS W/ TWL XL LVL3 (GOWN DISPOSABLE) ×4 IMPLANT
GOWN STRL REUS W/TWL LRG LVL3 (GOWN DISPOSABLE) ×1
GOWN STRL REUS W/TWL XL LVL3 (GOWN DISPOSABLE) ×1
K-WIRE 1.6 (WIRE) ×1
K-WIRE 1.6X100 (WIRE) ×1
K-WIRE 1.6X150 (WIRE) ×1
K-WIRE DBL TROCAR .045X4 (WIRE) ×1
K-WIRE FX100X1.6XNS KRSH (WIRE) ×1
K-WIRE FX150X1.6XTROC TIP (WIRE) ×1
KIT BASIN OR (CUSTOM PROCEDURE TRAY) ×2 IMPLANT
KIT TURNOVER KIT B (KITS) ×2 IMPLANT
KWIRE 1.6X150 (WIRE) IMPLANT
KWIRE DBL TROCAR .045X4 (WIRE) IMPLANT
KWIRE FX 150X1.6 TROC TIP (WIRE) IMPLANT
KWIRE FX100X1.6XNS KRSH (WIRE) IMPLANT
MANIFOLD NEPTUNE II (INSTRUMENTS) ×2 IMPLANT
NDL HYPO 25X1 1.5 SAFETY (NEEDLE) IMPLANT
NEEDLE HYPO 25X1 1.5 SAFETY (NEEDLE) IMPLANT
NS IRRIG 1000ML POUR BTL (IV SOLUTION) ×2 IMPLANT
PACK ORTHO EXTREMITY (CUSTOM PROCEDURE TRAY) ×2 IMPLANT
PAD ARMBOARD 7.5X6 YLW CONV (MISCELLANEOUS) ×4 IMPLANT
PAD CAST 4YDX4 CTTN HI CHSV (CAST SUPPLIES) IMPLANT
PADDING CAST COTTON 4X4 STRL (CAST SUPPLIES) ×3
PADDING CAST COTTON 6X4 STRL (CAST SUPPLIES) IMPLANT
PLATE HOOK OLECRANON 4H NEUTR (Plate) IMPLANT
PLATE LOCK VA 2.7X82 NS (Plate) IMPLANT
PLATE LOCK VA LONG 2.7X121 5H (Plate) IMPLANT
SCREW CORTICAL 3.2X20MM (Screw) IMPLANT
SCREW CORTICAL 3.2X24MM (Screw) IMPLANT
SCREW LOCK CORT ST 3.5X10 (Screw) IMPLANT
SCREW LOCK CORT ST 3.5X18 (Screw) IMPLANT
SCREW LOCK CORT ST 3.5X20 (Screw) IMPLANT
SCREW LOCK CORT ST 3.5X22 (Screw) IMPLANT
SCREW LOCK CORT ST 3.5X24 (Screw) IMPLANT
SCREW LOCK SELF-TAP 2.7X52MM (Screw) IMPLANT
SCREW LOCK VA ST 2.7X14 (Screw) IMPLANT
SCREW LOCK VA ST 2.7X26 (Screw) IMPLANT
SCREW LOCKING 2.7X58 T8 (Screw) IMPLANT
SCREW METAPHYSCAL 46MM (Screw) IMPLANT
SCREW METAPHYSEAL 2.7X48 (Screw) IMPLANT
SCREW METAPHYSEAL 2.7X56 (Screw) IMPLANT
SPLINT PLASTER CAST XFAST 5X30 (CAST SUPPLIES) IMPLANT
SPONGE T-LAP 18X18 ~~LOC~~+RFID (SPONGE) IMPLANT
STAPLER VISISTAT 35W (STAPLE) ×2 IMPLANT
STOCKINETTE IMPERVIOUS 9X36 MD (GAUZE/BANDAGES/DRESSINGS) IMPLANT
SUCTION TUBE FRAZIER 10FR DISP (SUCTIONS) ×2 IMPLANT
SUCTION TUBE FRAZIER 12FR DISP (SUCTIONS) IMPLANT
SUT ETHILON 2 0 FS 18 (SUTURE) ×4 IMPLANT
SUT ETHILON 2 0 PSLX (SUTURE) IMPLANT
SUT PDS AB 2-0 CT2 27 (SUTURE) IMPLANT
SUT VIC AB 0 CT1 27 (SUTURE) ×4
SUT VIC AB 0 CT1 27XBRD ANBCTR (SUTURE) ×4 IMPLANT
SUT VIC AB 2-0 CT1 27 (SUTURE) ×5
SUT VIC AB 2-0 CT1 TAPERPNT 27 (SUTURE) ×4 IMPLANT
SYR 5ML LL (SYRINGE) IMPLANT
SYR CONTROL 10ML LL (SYRINGE) IMPLANT
TOWEL GREEN STERILE (TOWEL DISPOSABLE) ×6 IMPLANT
TOWEL GREEN STERILE FF (TOWEL DISPOSABLE) ×2 IMPLANT
TRAY FOLEY MTR SLVR 16FR STAT (SET/KITS/TRAYS/PACK) IMPLANT
TUBE CONNECTING 20X1/4 (TUBING) IMPLANT
WATER STERILE IRR 1000ML POUR (IV SOLUTION) ×2 IMPLANT
YANKAUER SUCT BULB TIP NO VENT (SUCTIONS) IMPLANT

## 2023-08-14 NOTE — Anesthesia Procedure Notes (Signed)
Procedure Name: Intubation Date/Time: 08/14/2023 2:50 PM  Performed by: Thomasene Ripple, CRNAPre-anesthesia Checklist: Patient identified, Emergency Drugs available, Suction available and Patient being monitored Patient Re-evaluated:Patient Re-evaluated prior to induction Oxygen Delivery Method: Circle System Utilized Preoxygenation: Pre-oxygenation with 100% oxygen Induction Type: IV induction Ventilation: Mask ventilation without difficulty Laryngoscope Size: Miller and 2 Grade View: Grade I Tube type: Oral Tube size: 7.0 mm Number of attempts: 1 Airway Equipment and Method: Stylet and Oral airway Placement Confirmation: ETT inserted through vocal cords under direct vision, positive ETCO2 and breath sounds checked- equal and bilateral Tube secured with: Tape Dental Injury: Teeth and Oropharynx as per pre-operative assessment

## 2023-08-14 NOTE — H&P (Signed)
Orthopaedic Trauma Service (OTS) Consult   Patient ID: [name] MRN: [id number] DOB/AGE: 1951/03/11 72 y.o.    HPI: [name] is an 72 y.o. female who sustained a left distal humerus fracture while visiting her daughter out of state about 2 weeks ago.  Patient was seen at a local emergency department and found to have a left distal humerus fracture.  She eventually returned home and followed up with [place] on 08/06/2023.  We discussed operative versus nonoperative treatment including total elbow arthroplasty and ORIF.  After extensive discussion we decided to proceed with ORIF.  Patient is in agreement the plan.  She presents today for repair of her left distal humerus fracture.  Would anticipate overnight stay and discharge in the morning  Past Medical History:  Diagnosis Date   Abdominal bloating 08/12/2022   Acquired hypothyroidism 12/23/2021   Acute respiratory failure with hypoxemia 04/20/2014   Asthma 08/14/2015   Ataxia 12/23/2021   Capsulitis of metatarsophalangeal (MTP) joint of left foot 01/22/2018   Cough due to ACE inhibitor 09/30/2021   Daytime somnolence 12/23/2021   Essential hypertension 04/15/2014   External hemorrhoid 11/04/2022   GERD (gastroesophageal reflux disease)    Hearing loss of left ear due to cerumen impaction 10/25/2021   Imbalance 07/11/2022   Lacunar infarction 01/10/2021   right dorsal pons   Left tibial fracture 08/03/2019   Major depressive disorder 01/21/2022   Mixed hyperlipidemia 02/17/2022   Nocturia 09/30/2021   Obstructive sleep apnea    No CPAP   Primary localized osteoarthrosis, lower leg 04/18/2014   Snoring 09/30/2021   Tremor 06/29/2022   Type 1 diabetes mellitus with hyperglycemia 09/30/2021   Urine WBC increased 07/11/2022    Past Surgical History:  Procedure Laterality Date   APPENDECTOMY     APPLICATION OF WOUND VAC Left 08/03/2019   Procedure: Application Of Wound Vac;  Surgeon: Myrene Galas, MD;   [facility] OR;  Service: Orthopedics;  Laterality: Left;   CESAREAN SECTION     ESOPHAGOGASTRODUODENOSCOPY (EGD) WITH PROPOFOL N/A 02/02/2016   Procedure: ESOPHAGOGASTRODUODENOSCOPY (EGD) WITH PROPOFOL;  Surgeon: Elnita Maxwell, MD;  Location: [facility] ENDOSCOPY;  Service: Endoscopy;  Laterality: N/A;   HAND SURGERY     JOINT REPLACEMENT Bilateral    Knee    ORIF ANKLE FRACTURE Left 08/03/2019   with wound vac applied    ORIF ANKLE FRACTURE Left 08/03/2019   Procedure: OPEN REDUCTION INTERNAL FIXATION (ORIF) ANKLE FRACTURE;  Surgeon: Myrene Galas, MD;  Location: [facility] OR;  Service: Orthopedics;  Laterality: Left;   TIBIALIS TENDON TRANSFER / REPAIR     TOTAL SHOULDER REPLACEMENT      Family History  Problem Relation Age of Onset   Cancer Mother        Colon cancer    Dementia Father        mid 86s, unspecified type   Neuropathy Father    Schizophrenia Brother     Social History:  reports that she has never smoked. She has never used smokeless tobacco. She reports that she does not currently use alcohol. She reports that she does not use drugs.  Allergies:  Allergies  Allergen Reactions   Lisinopril Cough   Zetia [Ezetimibe]     CONFUSION    Medications: I have reviewed the patient's current medications. Current Outpatient Medications  Medication Instructions   alendronate (FOSAMAX) 70 mg, Oral, Weekly   amLODipine (NORVASC) 2.5 mg, Oral, Daily   aspirin EC 81  mg, Oral, Daily   calcium carbonate (OSCAL) 1500 (600 Ca) MG TABS tablet 600 mg of elemental calcium, Oral, Daily with breakfast   ferrous sulfate 325 mg, Oral, Daily with breakfast   Gvoke HypoPen 2-Pack 0.5 mg, Subcutaneous, Daily, As needed for low sugars less than 60 if does not respond oral sugar.   HYDROcodone-acetaminophen (NORCO/VICODIN) 5-325 MG tablet 1-2 tablets, Oral, Every 6 hours PRN   ibuprofen (ADVIL) 600 mg, Oral, Every 6 hours PRN   insulin aspart (NOVOLOG) 2-3 Units, Subcutaneous, 3 times daily  before meals, Sliding scale<BR>Start with 3 units, for 15 carb's add and additional unit   levothyroxine (SYNTHROID) 125 MCG tablet TAKE 1 TABLET BY MOUTH EVERY DAY BEFORE BREAKFAST   LORazepam (ATIVAN) 0.5 MG tablet TAKE 1 TABLET BY MOUTH EVERY DAY AS NEEDED FOR ANXIETY   omeprazole (PRILOSEC) 40 MG capsule Oral, Daily   OVER THE COUNTER MEDICATION 1 tablet, Oral, Daily, Care 1 stool softener   oxyCODONE (OXY IR/ROXICODONE) 5 mg, Oral, Every 6 hours PRN   polyethylene glycol powder (GLYCOLAX/MIRALAX) 17 GM/SCOOP powder 0.5 Containers, Oral, Daily, 1 Scoop   propranolol ER (INDERAL LA) 60 MG 24 hr capsule Oral, Daily   psyllium (METAMUCIL) 58.6 % powder 1 packet, Oral, Daily, 1 Scoop   rosuvastatin (CRESTOR) 40 mg, Oral, Daily   Tresiba FlexTouch 19 Units, Subcutaneous, Daily at bedtime   valsartan (DIOVAN) 320 mg, Oral, Daily   venlafaxine XR (EFFEXOR-XR) 150 MG 24 hr capsule Oral, Daily   vitamin B-12 (CYANOCOBALAMIN) 100 mcg, Oral, Daily   Vitamin D, Ergocalciferol, (DRISDOL) 1.25 MG (50000 UNIT) CAPS capsule TAKE 1 CAPSULE BY MOUTH EACH WEEK     No results found for this or any previous visit (from the past 48 hour(s)).  No results found.  Intake/Output    None      Review of Systems  Constitutional:  Negative for chills and fever.  Respiratory:  Negative for shortness of breath and wheezing.   Cardiovascular:  Negative for chest pain and palpitations.  Gastrointestinal:  Negative for nausea and vomiting.  Musculoskeletal:        Left elbow pain  Neurological:  Negative for tingling and sensory change.   There were no vitals taken for this visit. Physical Exam Constitutional:      General: She is not in acute distress.    Appearance: Normal appearance.  HENT:     Head: Normocephalic and atraumatic.  Eyes:     Extraocular Movements: Extraocular movements intact.  Cardiovascular:     Rate and Rhythm: Normal rate.     Pulses: Normal pulses.  Pulmonary:     Effort:  Pulmonary effort is normal.  Musculoskeletal:     Comments: Left upper extremity LAS in place Swelling improved Extensive ecchymosis Radial, ulnar, median nerve motor and sensory function intact.  AIN and PIN motor function intact Good perfusion distally + Radial pulse  Skin:    General: Skin is warm.  Neurological:     General: No focal deficit present.     Mental Status: She is alert and oriented to person, place, and time.  Psychiatric:        Mood and Affect: Mood normal.        Thought Content: Thought content normal.      Assessment/Plan:  72 year old female with comminuted left intra-articular distal humerus fracture  -Level fall  -Closed left intra-articular distal humerus fracture  OR for ORIF left distal humerus with olecranon osteotomy  She will be nonweightbearing  through her left upper extremity for 6 weeks.  No active extension for 6 to 8 weeks as well.  She will be splinted for 2 weeks and then begin range of motion exercises   Risks and benefits of surgery reviewed with the patient she wishes to proceed  Anticipate admission overnight for observation and pain control and then discharge in the morning  -Medical issues  Resume home medications as indicated  - Pain management:  Multimodal  - Metabolic Bone Disease:  Check basic labs  DEXA scan from 2023 shows a T-score of -2.6 and a right femur (right femoral neck) this is osteoporotic designation.  Prolia was recommended at that time it appears that she is on Fosamax.  We will have her stop this medication postoperatively to optimize her healing potential  -Impediments to fracture healing  Diabetes  Osteoporosis  - Activity:  As above  - Dispo:  OR for procedure noted above    Mearl Latin, PA-C [phone] (C) 08/14/2023, 8:42 AM  [facility] [address] [place] [address] [phone] [phone] (F)    After 5pm and on the weekends please log on to  Amion, go to orthopaedics and the look under the [facility] Call for the provider(s) on call. You can also call our office at [phone] and then follow the prompts to be connected to the call team.

## 2023-08-14 NOTE — Op Note (Signed)
08/14/2023  7:17 PM  PATIENT:  [name]  05/16/1951 female   MEDICAL RECORD NUMBER: [id number]  PREOPERATIVE DIAGNOSES: COMMINUTED LEFT TRANSCONDYLAR DISTAL HUMERUS FRACTURE.  POSTOPERATIVE DIAGNOSES: COMMINUTED LEFT TRANSCONDYLAR DISTAL HUMERUS FRACTURE.  PROCEDURE: 1.  OPEN REDUCTION INTERNAL FIXATION OF LEFT TRANSCONDYLAR HUMERUS FRACTURE. 2.  ULNAR NERVE NEUROPLASTY. 3.  ULNA OSTEOTOMY.  SURGEON:  Myrene Galas, MD  ASSISTANT:  Montez Morita, PA-C  ANESTHESIA:  General, supplemented with regional block.  COMPLICATIONS:  None.  TOURNIQUET:  None.  DISPOSITION:  To PACU.  CONDITION:  Stable.  BRIEF SUMMARY OF INDICATION FOR PROCEDURE:  The patient is right-hand dominant 72 y.o. who who fell sustaining severe elbow pain, deformity, and loss of function. I discussed with her the options for treatment including non-operative management, total elbow replacement, and surgical repair. Risks include malunion, nonunion, loss of reduction, loss of motion, DVT, PE, heart attack, stroke, heterotopic bone, and the need for multiple procedures including subsequent capsulectomy and removal of symptomatic hardware among others. After acknowledgement of these risks, consent was provided to proceed.  BRIEF SUMMARY OF PROCEDURE:  The patient was given a regional block preoperatively, taken to the operating room where general anesthesia was induced. The patient was then placed laterally with the operative extremity up and over a padded bolster, with all prominences padded appropriately.  Chlorhexidine wash, then Betadine scrub and paint were performed followed by standard prep and drape.  Timeout was held.  We began with a posterior incision and approach to the triceps. I dissected the plane on the lateral side and then the medial. The triceps was mobilized medially after identification of the ulnar nerve. Medial dissection was quite tedious and muscle injury around the ulnar nerve was noted,  prompting me to consider a neuroplasty to reduce subsequent risk of fibrosis and scarring that could lead in this situation to sensory and motor symptoms and require an additional surgery in the future. Consequently, the decision to proceed with formal neuroplasty was made. I placed a Penrose drain gently around the ulnar nerve, and with the help of my assistant, mobilized it in various directions while carefully releasing the adjacent tissue until it was completely free, past the cubital tunnel into the forearm, and up to the intermuscular septum proximally. Bipolar cautery was employed when in proximity of the nerve.  Once this was complete, I then went along the medial aspect and mobilized the triceps fascia, heading distally down to the ulna.  Normally, I would attempt distal humerus repair without bone cut of the ulna.  In this particular case  I chose to perform an ulnar osteotomy as an addition using the Chevron technique.  Using a microsagittal saw, I made the Chevron cut and completed it with the osteotome. The triceps was reflected exposing the severely comminuted fracture into the intercondylar notch and trochlear spool.  I was able to clean the fracture edges with a curette and lavage and tease them together with a dental pick.  This was followed with a small K-wire fixation using a 0.045 K-wires.  After this was complete, I then took additional K wires and secured the articular surface to the lateral column and an additional one in the medial column.  C-arm was brought  in.  It did show substantial comminution as previously noted, but sufficient to allow for plate osteosynthesis.  The correct position was gauged for both plates, a lateral plate securing fixation into the capitellum and up the lateral column and a medial plate checking plate and  screw position on orthogonal views with fluoro.  Standard fixation was used first then locked. Lastly, I then reduced the osteotomy, securing provisional  fixation with a Weber tenaculum then placing a Tri-Med olecranon hook plate engaging the tines proximally and securing eccentric fixation distally to compress, followed by additional 3.2 screws distally.  There were no complications during the procedure.  The patient underwent a thorough  irrigation and a layered closure using #1 Vicryl for the triceps and then 0 Vicryl, 2-0 Vicryl, and nylon.  Prior to beginning the closure, I replaced the ulnar nerve within the cubital tunnel.  An assistant was required for this case given its considerable technical difficulty, and this was helpful during provisional and definitive fixation, retraction, protection of the ulnar nerve, and  closure.  A gently compressive dressing was applied from hand to upper arm and then a splint.  The patient was taken to the PACU in stable condition after application of a sling.  PROGNOSIS:  The patient has a rather severely comminuted distal humerus fracture with underlying osteoporosis.  Consequently, we will protect the repair for the next few weeks in a splint and proceed at a slower pace with regard to rehabilitation.  This would be anticipated to result in some loss of motion, but if we can obtain healing, we will ultimately not have any restrictions with regard to resistance. She was encouraged to ice and elevate with hand above the elbow and elbow above the heart, move fingers frequently as able, and return to the office next week for removal of the splint and in two weeks for removal of sutures.

## 2023-08-14 NOTE — Transfer of Care (Signed)
Immediate Anesthesia Transfer of Care Note  Patient: [name]  Procedure(s) Performed: OPEN REDUCTION INTERNAL FIXATION (ORIF) DISTAL HUMERUS FRACTURE WITH OLECRANON OSTEOTOMY (Left: Elbow)  Patient Location: PACU  Anesthesia Type:GA combined with regional for post-op pain  Level of Consciousness: awake, alert , and oriented  Airway & Oxygen Therapy: Patient Spontanous Breathing and Patient connected to nasal cannula oxygen  Post-op Assessment: Report given to RN, Post -op Vital signs reviewed and stable, and Patient moving all extremities  Post vital signs: Reviewed and stable  Last Vitals:  Vitals Value Taken Time  BP 124/111 08/14/23 1952  Temp    Pulse 75 08/14/23 1957  Resp 16 08/14/23 1957  SpO2 93 % 08/14/23 1957  Vitals shown include unfiled device data.  Last Pain:  Vitals:   08/14/23 1222  TempSrc:   PainSc: 7          Complications: No notable events documented.

## 2023-08-15 ENCOUNTER — Other Ambulatory Visit (HOSPITAL_COMMUNITY): Payer: Self-pay

## 2023-08-15 DIAGNOSIS — S42472A Displaced transcondylar fracture of left humerus, initial encounter for closed fracture: Secondary | ICD-10-CM | POA: Diagnosis not present

## 2023-08-15 DIAGNOSIS — S42402A Unspecified fracture of lower end of left humerus, initial encounter for closed fracture: Secondary | ICD-10-CM | POA: Diagnosis not present

## 2023-08-15 LAB — GLUCOSE, CAPILLARY
Glucose-Capillary: 340 mg/dL — ABNORMAL HIGH (ref 70–99)
Glucose-Capillary: 258 mg/dL — ABNORMAL HIGH (ref 70–99)
Glucose-Capillary: 152 mg/dL — ABNORMAL HIGH (ref 70–99)
Glucose-Capillary: 265 mg/dL — ABNORMAL HIGH (ref 70–99)

## 2023-08-15 LAB — BASIC METABOLIC PANEL
Anion gap: 10 (ref 5–15)
BUN: 16 mg/dL (ref 8–23)
CO2: 24 mmol/L (ref 22–32)
Calcium: 8.6 mg/dL — ABNORMAL LOW (ref 8.9–10.3)
Chloride: 97 mmol/L — ABNORMAL LOW (ref 98–111)
Creatinine, Ser: 0.9 mg/dL (ref 0.44–1.00)
GFR, Estimated: >60 mL/min (ref >60)
Glucose, Bld: 352 mg/dL — ABNORMAL HIGH (ref 70–99)
Potassium: 3.6 mmol/L (ref 3.5–5.1)
Sodium: 131 mmol/L — ABNORMAL LOW (ref 135–145)

## 2023-08-15 LAB — CBC
HCT: 30.2 % — ABNORMAL LOW (ref 36.0–46.0)
Hemoglobin: 9.8 g/dL — ABNORMAL LOW (ref 12.0–15.0)
MCH: 30.8 pg (ref 26.0–34.0)
MCHC: 32.5 g/dL (ref 30.0–36.0)
MCV: 95 fL (ref 80.0–100.0)
Platelets: 358 10*3/uL (ref 150–400)
RBC: 3.18 MIL/uL — ABNORMAL LOW (ref 3.87–5.11)
RDW: 11.8 % (ref 11.5–15.5)
WBC: 11.3 10*3/uL — ABNORMAL HIGH (ref 4.0–10.5)
nRBC: 0 % (ref 0.0–0.2)

## 2023-08-15 MED ORDER — ALENDRONATE SODIUM 70 MG PO TABS: 70.0000 mg | ORAL_TABLET | ORAL | Status: AC

## 2023-08-15 MED ORDER — INSULIN ASPART 100 UNIT/ML IJ SOLN
8.0000 [IU] | Freq: Once | INTRAMUSCULAR | Status: AC
  Administered 2023-08-15: 8 [IU] via SUBCUTANEOUS

## 2023-08-15 MED ORDER — INSULIN ASPART 100 UNIT/ML IJ SOLN: 0.0000 [IU] | Freq: Three times a day (TID) | INTRAMUSCULAR | Status: DC

## 2023-08-15 MED ORDER — INSULIN ASPART 100 UNIT/ML IJ SOLN
0.0000 [IU] | Freq: Three times a day (TID) | INTRAMUSCULAR | Status: DC
  Administered 2023-08-15: 1 [IU] via SUBCUTANEOUS
  Administered 2023-08-15: 3 [IU] via SUBCUTANEOUS
  Administered 2023-08-16: 2 [IU] via SUBCUTANEOUS

## 2023-08-15 MED ORDER — METHOCARBAMOL 500 MG PO TABS
500.0000 mg | ORAL_TABLET | Freq: Four times a day (QID) | ORAL | 0 refills | Status: DC | PRN
  Filled 2023-08-15: qty 50, 13d supply, fill #0

## 2023-08-15 MED ORDER — HYDROCODONE-ACETAMINOPHEN 5-325 MG PO TABS
1.0000 | ORAL_TABLET | Freq: Four times a day (QID) | ORAL | Status: DC | PRN
  Administered 2023-08-15 – 2023-08-16 (×4): 2 via ORAL
  Filled 2023-08-15 (×4): qty 2

## 2023-08-15 MED ORDER — INSULIN ASPART 100 UNIT/ML IJ SOLN
2.0000 [IU] | Freq: Three times a day (TID) | INTRAMUSCULAR | Status: DC
  Administered 2023-08-15 – 2023-08-16 (×3): 2 [IU] via SUBCUTANEOUS

## 2023-08-15 MED ORDER — DOCUSATE SODIUM 100 MG PO CAPS
100.0000 mg | ORAL_CAPSULE | Freq: Two times a day (BID) | ORAL | 0 refills | Status: DC
  Filled 2023-08-15: qty 100, 50d supply, fill #0

## 2023-08-15 MED ORDER — HYDROCODONE-ACETAMINOPHEN 5-325 MG PO TABS
1.0000 | ORAL_TABLET | Freq: Four times a day (QID) | ORAL | 0 refills | Status: DC | PRN
Start: 2023-08-15 — End: 2023-09-30
  Filled 2023-08-15: qty 50, 7d supply, fill #0

## 2023-08-15 NOTE — Progress Notes (Signed)
    Durable Medical Equipment  (From admission, onward)           Start     Ordered   08/15/23 1254  For home use only DME Cane  Once        08/15/23 1253   08/15/23 1253  For home use only DME Bedside commode  Once       Question:  Patient needs a bedside commode to treat with the following condition  Answer:  Fx   08/15/23 1253   08/15/23 1253  For home use only DME Other see comment  Once       Comments: Tub/shower seat  Question:  Length of Need  Answer:  6 Months   08/15/23 1253

## 2023-08-15 NOTE — Inpatient Diabetes Management (Signed)
Inpatient Diabetes Program Recommendations  AACE/ADA: New Consensus Statement on Inpatient Glycemic Control (2015)  Target Ranges:  Prepandial:   less than 140 mg/dL      Peak postprandial:   less than 180 mg/dL (1-2 hours)      Critically ill patients:  140 - 180 mg/dL   Lab Results  Component Value Date   GLUCAP 340 (H) 08/15/2023   HGBA1C 8.0 (H) 05/30/2023    Review of Glycemic Control  Latest Reference Range & Units 08/14/23 23:13 08/15/23 07:51  Glucose-Capillary 70 - 99 mg/dL 098 (H) 119 (H)   Diabetes history: DM 1 (since age 72) Outpatient Diabetes medications:  Wears Dexcom sensor Tresiba 18 units q HS Novolog 3 units with breakfast, 5 units with lunch and 2-3 units with supper- Correction factor 1 unit/50 mg/dL>150 mg/dL Current orders for Inpatient glycemic control:  Novolog 0-15 units tid with meals  Novolog 4 units tid with meals Semglee 19 units q HS  Inpatient Diabetes Program Recommendations:   Of note, patient received Decadron 10 mg x1 in surgery. Please consider reducing Novolog correction to very sensitive (0-6 units) tid with meals as per endocrinology note, 1 units drops blood sugar about 50 mg/dL.    Thanks,  Beryl Meager, RN, BC-ADM Inpatient Diabetes Coordinator Pager [phone]  (8a-5p)

## 2023-08-15 NOTE — Evaluation (Signed)
Physical Therapy Evaluation Patient Details Name: [name] MRN: [id number] DOB: 06-15-1951 Today's Date: 08/15/2023  History of Present Illness  The pt is a 72 yo female presenting 9/26 s/p ORIF of L distal humerus fx sustained in a fall while [place] ~2 weeks prior to admission. PMH includes: hypothyroidism, asthma, HTN, GERD, R dorsal pons infarct, L tibial fx, depression, HLD, OSA, type 1 diabetes, tremor, bilateral knee replacements, total shoulder replacement, and L ankle fx s/p ORIF.   Clinical Impression  Pt in bed upon arrival of PT, agreeable to evaluation at this time. Prior to admission the pt reports she was independent without use of DME, and was regularly attending gym for activity. She lives with her son in a ground-floor apt and reports he only works part time and can provide some assistance. The pt required CGA for sit-stand transfers and is notably less steady when standing without UE support. She attempted walking without UE support but was reaching for furniture and had multiple staggering steps and LOB needing minA to recover. The pt demos improved stability with a cane, but was unable to tolerate any balance challenge (head turns, stepping over objects) and needed minA to steady with this challenge. Will benefit from additional DME and HHPT to maximize safety in home and reduce risk of further falls.      If plan is discharge home, recommend the following: A little help with walking and/or transfers;A little help with bathing/dressing/bathroom;Assistance with cooking/housework;Direct supervision/assist for medications management;Direct supervision/assist for financial management;Assist for transportation;Help with stairs or ramp for entrance   Can travel by private vehicle        Equipment Recommendations Cane;BSC/3in1  Recommendations for Other Services       Functional Status Assessment Patient has had a recent decline in their functional status and  demonstrates the ability to make significant improvements in function in a reasonable and predictable amount of time.     Precautions / Restrictions Precautions Precautions: Fall Precaution Comments: x2 falls in 6 months Required Braces or Orthoses: Sling Splint/Cast - Date Prophylactic Dressing Applied (if applicable): 08/14/23 Restrictions Weight Bearing Restrictions: Yes LUE Weight Bearing: Non weight bearing Other Position/Activity Restrictions: No formal orders, maintained NWB in sling      Mobility  Bed Mobility Overal bed mobility: Needs Assistance Bed Mobility: Supine to Sit, Sit to Supine     Supine to sit: Supervision, HOB elevated Sit to supine: Supervision, HOB elevated   General bed mobility comments: supervision with HOB elevated, pt does not sleep in a bed    Transfers Overall transfer level: Needs assistance Equipment used: None, Straight cane Transfers: Sit to/from Stand Sit to Stand: Contact guard assist           General transfer comment: CGA with or without use of cane. no knee buckling but pt unsteady without UE support    Ambulation/Gait Ambulation/Gait assistance: Min assist, Contact guard assist Gait Distance (Feet): 100 Feet Assistive device: None, Straight cane Gait Pattern/deviations: Step-through pattern, Decreased stride length, Staggering left, Staggering right, Drifts right/left, Wide base of support Gait velocity: decreased Gait velocity interpretation: <1.31 ft/sec, indicative of household ambulator   General Gait Details: pt with intermittent staggering steps, increased sway and reaching for furniture without use of UE support. when given cane, pt with slowed but steady gait. unable to tolerate any balance challenge    Balance Overall balance assessment: Needs assistance, History of Falls Sitting-balance support: No upper extremity supported, Feet supported Sitting balance-Leahy Scale: Good  Standing balance support: Single  extremity supported, During functional activity Standing balance-Leahy Scale: Poor Standing balance comment: poor tolerance for balance challenge. LOB needing minA with attempt to move head while walking.                 Standardized Balance Assessment Standardized Balance Assessment : Dynamic Gait Index   Dynamic Gait Index Level Surface: Moderate Impairment Gait with Horizontal Head Turns: Severe Impairment Gait with Vertical Head Turns: Severe Impairment Step Over Obstacle: Severe Impairment Step Around Obstacles: Moderate Impairment       Pertinent Vitals/Pain Pain Assessment Pain Assessment: 0-10 Pain Score: 9  Pain Location: L elbow Pain Descriptors / Indicators: Aching, Discomfort, Grimacing Pain Intervention(s): Limited activity within patient's tolerance, Monitored during session, Repositioned, RN gave pain meds during session    Home Living Family/patient expects to be discharged to:: Private residence Living Arrangements: Children Available Help at Discharge: Family;Available PRN/intermittently (24/7 initially) Type of Home: Apartment Home Access: Level entry       Home Layout: One level Home Equipment: Grab bars - tub/shower (suction cup grab bars for shower, and walking stick (hiking stick)) Additional Comments: sleeps on a couch in her living room    Prior Function Prior Level of Function : Independent/Modified Independent;History of Falls (last six months)             Mobility Comments: pt reports independent, x2 falls, once tripping over innertube at pool and once falling down 1 step while walking (did not see it) ADLs Comments: pt reports no issues but also later reports difficulty with ADLs since fx     Extremity/Trunk Assessment   Upper Extremity Assessment Upper Extremity Assessment: Defer to OT evaluation    Lower Extremity Assessment Lower Extremity Assessment: Overall WFL for tasks assessed    Cervical / Trunk  Assessment Cervical / Trunk Assessment: Normal  Communication   Communication Communication: Hearing impairment Cueing Techniques: Verbal cues;Visual cues;Gestural cues  Cognition Arousal: Alert Behavior During Therapy: Impulsive Overall Cognitive Status: Impaired/Different from baseline Area of Impairment: Attention, Memory, Safety/judgement, Awareness, Problem solving                   Current Attention Level: Sustained Memory: Decreased recall of precautions   Safety/Judgement: Decreased awareness of deficits, Decreased awareness of safety Awareness: Intellectual Problem Solving: Decreased initiation, Difficulty sequencing, Requires verbal cues General Comments: pt with poor awareness, ran L elbow into bed and poor ability to watch for IV line. limited by [facility], but pt also with impaired safety awareness        General Comments General comments (skin integrity, edema, etc.): VSS on RA, pt unable to tolerate LUE elevation, encouraged to move at fingers due to significant bruising and swelling    Exercises     Assessment/Plan    PT Assessment Patient needs continued PT services  PT Problem List Decreased strength;Decreased range of motion;Decreased activity tolerance;Decreased balance;Decreased mobility;Decreased safety awareness       PT Treatment Interventions DME instruction;Gait training;Stair training;Therapeutic activities;Therapeutic exercise;Functional mobility training;Balance training;Patient/family education    PT Goals (Current goals can be found in the Care Plan section)  Acute Rehab PT Goals Patient Stated Goal: return home PT Goal Formulation: With patient Time For Goal Achievement: 08/29/23 Potential to Achieve Goals: Good    Frequency Min 1X/week        AM-PAC PT "6 Clicks" Mobility  Outcome Measure Help needed turning from your back to your side while in a flat bed without using bedrails?:  A Little Help needed moving from lying on your back  to sitting on the side of a flat bed without using bedrails?: A Little Help needed moving to and from a bed to a chair (including a wheelchair)?: A Little Help needed standing up from a chair using your arms (e.g., wheelchair or bedside chair)?: A Little Help needed to walk in hospital room?: A Little Help needed climbing 3-5 steps with a railing? : A Lot 6 Click Score: 17    End of Session Equipment Utilized During Treatment: Gait belt;Other (comment) (L shoulder sling) Activity Tolerance: Patient limited by pain;Patient tolerated treatment well Patient left: in bed;with call bell/phone within reach Nurse Communication: Mobility status PT Visit Diagnosis: Unsteadiness on feet (R26.81);Repeated falls (R29.6);Difficulty in walking, not elsewhere classified (R26.2)    Time: 1610-9604 PT Time Calculation (min) (ACUTE ONLY): 33 min   Charges:   PT Evaluation $PT Eval Moderate Complexity: 1 Mod PT Treatments $Gait Training: 8-22 mins PT General Charges $$ ACUTE PT VISIT: 1 Visit         Vickki Muff, PT, DPT   Acute Rehabilitation Department Office [phone] Secure Chat Communication Preferred  Ronnie Derby 08/15/2023, 9:02 AM

## 2023-08-15 NOTE — Anesthesia Postprocedure Evaluation (Signed)
Anesthesia Post Note  Patient: [name]  Procedure(s) Performed: OPEN REDUCTION INTERNAL FIXATION (ORIF) DISTAL HUMERUS FRACTURE WITH OLECRANON OSTEOTOMY (Left: Elbow)     Patient location during evaluation: PACU Anesthesia Type: General and Regional Level of consciousness: awake and alert Pain management: pain level controlled Vital Signs Assessment: post-procedure vital signs reviewed and stable Respiratory status: spontaneous breathing, nonlabored ventilation, respiratory function stable and patient connected to nasal cannula oxygen Cardiovascular status: blood pressure returned to baseline and stable Postop Assessment: no apparent nausea or vomiting Anesthetic complications: no   No notable events documented.  Last Vitals:  Vitals:   08/15/23 0000 08/15/23 0508  BP: (!) 162/88 (!) 162/65  Pulse:  93  Resp:  18  Temp:  36.8 C  SpO2:  93%    Last Pain:  Vitals:   08/15/23 0653  TempSrc:   PainSc: 7                  [place] [facility]

## 2023-08-15 NOTE — Progress Notes (Signed)
Orthopaedic Trauma Service Progress Note  Patient ID: Parish Augustine MRN: [id number] DOB/AGE: May 11, 1951 72 y.o.  Subjective:  Block wore off early morning Oxy not working  Wants to try norco again  No other complaints  Did well with therapy this am   Home this afternoon vs tomorrow   ROS As above Objective:   VITALS:   Vitals:   08/14/23 2314 08/15/23 0000 08/15/23 0508 08/15/23 0757  BP: (!) 175/90 (!) 162/88 (!) 162/65 (!) 157/77  Pulse: 83  93 92  Resp: 18  18 18   Temp: 97.7 F (36.5 C)  98.3 F (36.8 C) 98 F (36.7 C)  TempSrc: Oral  Oral   SpO2: 97%  93% 91%  Weight:      Height:        Estimated body mass index is 28.34 kg/m as calculated from the following:   Height as of this encounter: 5\' 3"  (1.6 m).   Weight as of this encounter: 72.6 kg.   Intake/Output      09/26 0701 09/27 0700 09/27 0701 09/28 0700   I.V. (mL/kg) 2300 (31.7)    Total Intake(mL/kg) 2300 (31.7)    Urine (mL/kg/hr) 300    Blood 150    Total Output 450    Net +1850         Urine Occurrence 1 x      LABS  Results for orders placed or performed during the hospital encounter of 08/14/23 (from the past 24 hour(s))  Glucose, capillary     Status: Abnormal   Collection Time: 08/14/23  1:48 PM  Result Value Ref Range   Glucose-Capillary 151 (H) 70 - 99 mg/dL  Glucose, capillary     Status: Abnormal   Collection Time: 08/14/23  8:01 PM  Result Value Ref Range   Glucose-Capillary 159 (H) 70 - 99 mg/dL  Glucose, capillary     Status: Abnormal   Collection Time: 08/14/23 10:48 PM  Result Value Ref Range   Glucose-Capillary 291 (H) 70 - 99 mg/dL  Glucose, capillary     Status: Abnormal   Collection Time: 08/14/23 11:13 PM  Result Value Ref Range   Glucose-Capillary 282 (H) 70 - 99 mg/dL  Glucose, capillary     Status: Abnormal   Collection Time: 08/15/23  7:51 AM  Result Value Ref Range    Glucose-Capillary 340 (H) 70 - 99 mg/dL     PHYSICAL EXAM:   Gen: Awake and alert, sitting up in bed, NAD, looks good  Lungs: unlabored Ext:       Left Upper Extremity   LAS fitting well  Ext warm  Moderate swelling  Ecchymosis stable  Good perfusion distally   Radial, ulnar, median nv motor and sensory functions intact  Radial, ulnar, median, AIN, PIN motor intact  No pain with passive stretching of digits    Assessment/Plan: 1 Day Post-Op   Principal Problem:   Closed fracture of left distal humerus   Anti-infectives (From admission, onward)    Start     Dose/Rate Route Frequency Ordered Stop   08/15/23 0000  ceFAZolin (ANCEF) IVPB 1 g/50 mL premix        1 g 100 mL/hr over 30 Minutes Intravenous Every 6 hours 08/14/23 2308 08/15/23 1759   08/14/23 1115  ceFAZolin (ANCEF) IVPB 2g/100 mL  premix        2 g 200 mL/hr over 30 Minutes Intravenous On call to O.R. 08/14/23 1113 08/14/23 1500     .  POD/HD#: 1  72 y/o female s/p ground level fall with closed L distal humerus fracture   -closed left intra-articular distal humerus fracture s/p ORIF  Weightbearing NWB Left upper extremity  Sling at all times   ROM/Activity    Unrestricted ROM of digits and wrist     Activity as tolerated while maintaining weightbearing restrictions      Wound care   Splint to remain on until office follow up    PT/OT   Likely home this afternoon vs tomorrow   - Pain management:  Change oxy to norco   - ABL anemia/Hemodynamics  Stable  - Medical issues   DM   Home meds  - DVT/PE prophylaxis:  Lovenox while inpatient   Home asa   - ID:   Periop abx   - Metabolic Bone Disease:  Hypervitaminosis D   Stop vitamin d 2   - Activity:  As above  - FEN/GI prophylaxis/Foley/Lines:  Carb mod diet  -Ex-fix/Splint care:  Splint/dressing to remain on until follow up   - Impediments to fracture healing:  Diabetes   Low energy fracture  - Dispo:  Possible home  today vs tomorrow     Mearl Latin, PA-C [phone] (C) 08/15/2023, 11:45 AM  [facility] [address] [place] [address] [phone] [phone] (F)    After 5pm and on the weekends please log on to Amion, go to orthopaedics and the look under the [facility] Call for the provider(s) on call. You can also call our office at [phone] and then follow the prompts to be connected to the call team.  Patient ID: Ellamae Sia, female   DOB: 06/30/1951, 72 y.o.   MRN: [id number]

## 2023-08-15 NOTE — Discharge Instructions (Addendum)
[facility] Discharge Instructions   General Discharge Instructions   WEIGHT BEARING STATUS: Nonweightbearing Left upper extremity, no lifting with left arm   RANGE OF MOTION/ACTIVITY: no elbow motion as you are splinted.  Sling on at all times. Ok to move fingers and wrist   Bone health: stop taking vitamin d2 as your vitamin d levels are higher than normal range   Review the following resource for additional information regarding bone health  [email or web address]  Wound Care: no wound care, keep splint clean and dry. Do not remove splint   DVT/PE prophylaxis:home dose aspirin   Diet: as you were eating previously.  Can use over the counter stool softeners and bowel preparations, such as Miralax, to help with bowel movements.  Narcotics can be constipating.  Be sure to drink plenty of fluids  PAIN MEDICATION USE AND EXPECTATIONS  You have likely been given narcotic medications to help control your pain.  After a traumatic event that results in an fracture (broken bone) with or without surgery, it is ok to use narcotic pain medications to help control one's pain.  We understand that everyone responds to pain differently and each individual patient will be evaluated on a regular basis for the continued need for narcotic medications. Ideally, narcotic medication use should last no more than 6-8 weeks (coinciding with fracture healing).   As a patient it is your responsibility as well to monitor narcotic medication use and report the amount and frequency you use these medications when you come to your office visit.   We would also advise that if you are using narcotic medications, you should take a dose prior to therapy to maximize you participation.  IF YOU ARE ON NARCOTIC MEDICATIONS IT IS NOT PERMISSIBLE TO OPERATE A MOTOR VEHICLE (MOTORCYCLE/CAR/TRUCK/MOPED) OR HEAVY MACHINERY DO NOT MIX NARCOTICS WITH OTHER CNS (CENTRAL NERVOUS SYSTEM)  DEPRESSANTS SUCH AS ALCOHOL   POST-OPERATIVE OPIOID TAPER INSTRUCTIONS: It is important to wean off of your opioid medication as soon as possible. If you do not need pain medication after your surgery it is ok to stop day one. Opioids include: Codeine, Hydrocodone(Norco, Vicodin), Oxycodone(Percocet, oxycontin) and hydromorphone amongst others.  Long term and even short term use of opiods can cause: Increased pain response Dependence Constipation Depression Respiratory depression And more.  Withdrawal symptoms can include Flu like symptoms Nausea, vomiting And more Techniques to manage these symptoms Hydrate well Eat regular healthy meals Stay active Use relaxation techniques(deep breathing, meditating, yoga) Do Not substitute Alcohol to help with tapering If you have been on opioids for less than two weeks and do not have pain than it is ok to stop all together.  Plan to wean off of opioids This plan should start within one week post op of your fracture surgery  Maintain the same interval or time between taking each dose and first decrease the dose.  Cut the total daily intake of opioids by one tablet each day Next start to increase the time between doses. The last dose that should be eliminated is the evening dose.    STOP SMOKING OR USING NICOTINE PRODUCTS!!!!  As discussed nicotine severely impairs your body's ability to heal surgical and traumatic wounds but also impairs bone healing.  Wounds and bone heal by forming microscopic blood vessels (angiogenesis) and nicotine is a vasoconstrictor (essentially, shrinks blood vessels).  Therefore, if vasoconstriction occurs to these microscopic blood vessels they essentially disappear and are unable to deliver necessary nutrients to the healing tissue.  This is one modifiable factor that you can do to dramatically increase your chances of healing your injury.    (This means no smoking, no nicotine gum, patches, etc)  DO NOT USE  NONSTEROIDAL ANTI-INFLAMMATORY DRUGS (NSAID'S)  Using products such as Advil (ibuprofen), Aleve (naproxen), Motrin (ibuprofen) for additional pain control during fracture healing can delay and/or prevent the healing response.  If you would like to take over the counter (OTC) medication, Tylenol (acetaminophen) is ok.  However, some narcotic medications that are given for pain control contain acetaminophen as well. Therefore, you should not exceed more than 4000 mg of tylenol in a day if you do not have liver disease.  Also note that there are may OTC medicines, such as cold medicines and allergy medicines that my contain tylenol as well.  If you have any questions about medications and/or interactions please ask your doctor/PA or your pharmacist.      ICE AND ELEVATE INJURED/OPERATIVE EXTREMITY  Using ice and elevating the injured extremity above your heart can help with swelling and pain control.  Icing in a pulsatile fashion, such as 20 minutes on and 20 minutes off, can be followed.    Do not place ice directly on skin. Make sure there is a barrier between to skin and the ice pack.    Using frozen items such as frozen peas works well as the conform nicely to the are that needs to be iced.  USE AN ACE WRAP OR TED HOSE FOR SWELLING CONTROL  In addition to icing and elevation, Ace wraps or TED hose are used to help limit and resolve swelling.  It is recommended to use Ace wraps or TED hose until you are informed to stop.    When using Ace Wraps start the wrapping distally (farthest away from the body) and wrap proximally (closer to the body)   Example: If you had surgery on your leg or thing and you do not have a splint on, start the ace wrap at the toes and work your way up to the thigh        If you had surgery on your upper extremity and do not have a splint on, start the ace wrap at your fingers and work your way up to the upper arm  IF YOU ARE IN A SPLINT OR CAST DO NOT REMOVE IT FOR ANY  REASON   If your splint gets wet for any reason please contact the office immediately. You may shower in your splint or cast as long as you keep it dry.  This can be done by wrapping in a cast cover or garbage back (or similar)  Do Not stick any thing down your splint or cast such as pencils, money, or hangers to try and scratch yourself with.  If you feel itchy take benadryl as prescribed on the bottle for itching  IF YOU ARE IN A CAM BOOT (BLACK BOOT)  You may remove boot periodically. Perform daily dressing changes as noted below.  Wash the liner of the boot regularly and wear a sock when wearing the boot. It is recommended that you sleep in the boot until told otherwise    Call office for the following: Temperature greater than 101F Persistent nausea and vomiting Severe uncontrolled pain Redness, tenderness, or signs of infection (pain, swelling, redness, odor or green/yellow discharge around the site) Difficulty breathing, headache or visual disturbances Hives Persistent dizziness or light-headedness Extreme fatigue Any other questions or concerns you may have after  discharge  In an emergency, call 911 or go to an Emergency Department at a nearby hospital  HELPFUL INFORMATION  If you had a block, it will wear off between 8-24 hrs postop typically.  This is period when your pain may go from nearly zero to the pain you would have had postop without the block.  This is an abrupt transition but nothing dangerous is happening.  You may take an extra dose of narcotic when this happens.  You should wean off your narcotic medicines as soon as you are able.  Most patients will be off or using minimal narcotics before their first postop appointment.   We suggest you use the pain medication the first night prior to going to bed, in order to ease any pain when the anesthesia wears off. You should avoid taking pain medications on an empty stomach as it will make you nauseous.  Do not drink  alcoholic beverages or take illicit drugs when taking pain medications.  In most states it is against the law to drive while you are in a splint or sling.  And certainly against the law to drive while taking narcotics.  You may return to work/school in the next couple of days when you feel up to it.   Pain medication may make you constipated.  Below are a few solutions to try in this order: Decrease the amount of pain medication if you aren't having pain. Drink lots of decaffeinated fluids. Drink prune juice and/or each dried prunes  If the first 3 don't work start with additional solutions Take Colace - an over-the-counter stool softener Take Senokot - an over-the-counter laxative Take Miralax - a stronger over-the-counter laxative     CALL THE OFFICE WITH ANY QUESTIONS OR CONCERNS: [phone]   VISIT OUR WEBSITE FOR ADDITIONAL INFORMATION: [email or web address]

## 2023-08-15 NOTE — Plan of Care (Signed)
  Problem: Education: Goal: Ability to describe self-care measures that may prevent or decrease complications (Diabetes Survival Skills Education) will improve Outcome: Progressing Goal: Individualized Educational Video(s) Outcome: Progressing   Problem: Coping: Goal: Ability to adjust to condition or change in health will improve Outcome: Progressing   Problem: Fluid Volume: Goal: Ability to maintain a balanced intake and output will improve Outcome: Progressing   Problem: Health Behavior/Discharge Planning: Goal: Ability to identify and utilize available resources and services will improve Outcome: Progressing Goal: Ability to manage health-related needs will improve Outcome: Progressing   Problem: Metabolic: Goal: Ability to maintain appropriate glucose levels will improve Outcome: Progressing   Problem: Nutritional: Goal: Maintenance of adequate nutrition will improve Outcome: Progressing Goal: Progress toward achieving an optimal weight will improve Outcome: Progressing   Problem: Skin Integrity: Goal: Risk for impaired skin integrity will decrease Outcome: Progressing   Problem: Tissue Perfusion: Goal: Adequacy of tissue perfusion will improve Outcome: Progressing   Problem: Education: Goal: Knowledge of the prescribed therapeutic regimen will improve Outcome: Progressing Goal: Understanding of activity limitations/precautions following surgery will improve Outcome: Progressing Goal: Individualized Educational Video(s) Outcome: Progressing   Problem: Activity: Goal: Ability to tolerate increased activity will improve Outcome: Progressing   Problem: Pain Management: Goal: Pain level will decrease with appropriate interventions Outcome: Progressing   Problem: Education: Goal: Knowledge of General Education information will improve Description: Including pain rating scale, medication(s)/side effects and non-pharmacologic comfort measures Outcome:  Progressing   Problem: Health Behavior/Discharge Planning: Goal: Ability to manage health-related needs will improve Outcome: Progressing   Problem: Clinical Measurements: Goal: Ability to maintain clinical measurements within normal limits will improve Outcome: Progressing Goal: Will remain free from infection Outcome: Progressing Goal: Diagnostic test results will improve Outcome: Progressing Goal: Respiratory complications will improve Outcome: Progressing Goal: Cardiovascular complication will be avoided Outcome: Progressing   Problem: Activity: Goal: Risk for activity intolerance will decrease Outcome: Progressing   Problem: Nutrition: Goal: Adequate nutrition will be maintained Outcome: Progressing   Problem: Pain Managment: Goal: General experience of comfort will improve Outcome: Progressing   Problem: Safety: Goal: Ability to remain free from injury will improve Outcome: Progressing   Problem: Skin Integrity: Goal: Risk for impaired skin integrity will decrease Outcome: Progressing

## 2023-08-15 NOTE — TOC Transition Note (Addendum)
Transition of Care [facility]) - CM/SW Discharge Note   Patient Details  Name: [name] MRN: [id number] Date of Birth: 12/02/1950  [facility] CM/SW Contact:  Epifanio Lesches, RN Phone Number: 08/15/2023, 2:18 PM   Clinical Narrative:    Patient will DC to: home Anticipated DC date:08/15/2023 Family notified: yes Transport by: car       - closed left intra-articular distal humerus fracture s/p ORIF  Per MD patient ready for DC today . RN, patient, and patient's family aware of DC plan.Pt states son to assist with care once d/c. Order noted for home health and DME services. Pt agreeable. Pt without provider preference. Referral made with [facility] and accepted. Referral made with [employer] [phone] / Ian Malkin) for DME Bedside commode, Cane (shower Laury Deep seat). Equipment will be delivered to bedside prior to discharge once MD/provide  cosign order and narrative, NCM made provider aware via secure chat.  Post hospital f/u noted on AVS. Pt without RX med concerns. Son to provide transportation home once d/c ready.   RNCM will sign off for now as intervention is no longer needed. Please consult [place] again if new needs arise.    Final next level of care: Home w Home Health Services Barriers to Discharge: Other (must enter comment) (therapy to clear)   Patient Goals and CMS Choice   Choice offered to / list presented to : Patient  Discharge Placement                         Discharge Plan and Services Additional resources added to the After Visit Summary for     Discharge Planning Services: CM Consult            DME Arranged: Bedside commode, Cane (shower Laury Deep seat) DME Agency: [facility] Date DME Agency Contacted: 08/15/23 Time DME Agency Contacted: [phone]   HH Arranged: PT, OT HH Agency: [facility] Date HH Agency Contacted: 08/15/23 Time HH Agency Contacted: [phone] Representative spoke with at [facility] Agency: Amy  Social Determinants of Health  (SDOH) Interventions SDOH Screenings   Food Insecurity: No Food Insecurity (08/15/2023)  Housing: Low Risk  (08/15/2023)  Transportation Needs: No Transportation Needs (08/15/2023)  Utilities: Not At Risk (08/15/2023)  Alcohol Screen: Low Risk  (11/04/2022)  Depression (PHQ2-9): Low Risk  (05/27/2023)  Tobacco Use: Low Risk  (08/14/2023)     Readmission Risk Interventions     No data to display

## 2023-08-15 NOTE — Discharge Summary (Signed)
[facility] ([facility]S) Discharge Summary   Patient ID: [name] MRN: [id number] DOB/AGE: [birth year] 72 y.o.  Admit date: 08/14/2023 Discharge date: 08/15/2023  Admission Diagnoses:***  Discharge Diagnoses:  Principal Problem:   Closed fracture of left distal humerus   Past Medical History:  Diagnosis Date   Abdominal bloating 08/12/2022   Acquired hypothyroidism 12/23/2021   Acute respiratory failure with hypoxemia 04/20/2014   Asthma 08/14/2015   Ataxia 12/23/2021   Capsulitis of metatarsophalangeal (MTP) joint of left foot 01/22/2018   Cough due to ACE inhibitor 09/30/2021   Daytime somnolence 12/23/2021   Essential hypertension 04/15/2014   External hemorrhoid 11/04/2022   GERD (gastroesophageal reflux disease)    Hearing loss of left ear due to cerumen impaction 10/25/2021   Imbalance 07/11/2022   Lacunar infarction 01/10/2021   right dorsal pons   Left tibial fracture 08/03/2019   Major depressive disorder 01/21/2022   Mixed hyperlipidemia 02/17/2022   Nocturia 09/30/2021   Obstructive sleep apnea    No CPAP   Primary localized osteoarthrosis, lower leg 04/18/2014   Snoring 09/30/2021   Tremor 06/29/2022   Type 1 diabetes mellitus with hyperglycemia 09/30/2021   Urine WBC increased 07/11/2022     Procedures Performed:  Discharged Condition: {condition:18240}  Hospital Course: ***  Consults: {consultation:18241}  Significant Diagnostic Studies: {diagnostics:18242}  Treatments: {Tx:18249}  Discharge Exam:  ***  Disposition: Discharge disposition: 01-Home or Self Care       Discharge Instructions     Call MD / Call 911   Complete by: As directed    If you experience chest pain or shortness of breath, CALL 911 and be transported to the hospital emergency room.  If you develope a fever above 101 F, pus (white drainage) or increased drainage or redness at the wound, or calf pain, call your surgeon's office.    Constipation Prevention   Complete by: As directed    Drink plenty of fluids.  Prune juice may be helpful.  You may use a stool softener, such as Colace (over the counter) 100 mg twice a day.  Use MiraLax (over the counter) for constipation as needed.   Diet Carb Modified   Complete by: As directed    Discharge instructions   Complete by: As directed    [facility] Discharge Instructions   General Discharge Instructions   WEIGHT BEARING STATUS: Nonweightbearing Left upper extremity, no lifting with left arm   RANGE OF MOTION/ACTIVITY: no elbow motion as you are splinted.  Sling on at all times. Ok to move fingers and wrist   Bone health: stop taking vitamin d2 as your vitamin d levels are higher than normal range   Review the following resource for additional information regarding bone health  [email or web address]  Wound Care: no wound care, keep splint clean and dry. Do not remove splint   DVT/PE prophylaxis:home dose aspirin   Diet: as you were eating previously.  Can use over the counter stool softeners and bowel preparations, such as Miralax, to help with bowel movements.  Narcotics can be constipating.  Be sure to drink plenty of fluids  PAIN MEDICATION USE AND EXPECTATIONS  You have likely been given narcotic medications to help control your pain.  After a traumatic event that results in an fracture (broken bone) with or without surgery, it is ok to use narcotic pain medications to help control one's pain.  We understand that everyone responds to pain differently and each individual patient will be evaluated on a  regular basis for the continued need for narcotic medications. Ideally, narcotic medication use should last no more than 6-8 weeks (coinciding with fracture healing).   As a patient it is your responsibility as well to monitor narcotic medication use and report the amount and frequency you use these medications when you come to your  office visit.   We would also advise that if you are using narcotic medications, you should take a dose prior to therapy to maximize you participation.  IF YOU ARE ON NARCOTIC MEDICATIONS IT IS NOT PERMISSIBLE TO OPERATE A MOTOR VEHICLE (MOTORCYCLE/CAR/TRUCK/MOPED) OR HEAVY MACHINERY DO NOT MIX NARCOTICS WITH OTHER CNS (CENTRAL NERVOUS SYSTEM) DEPRESSANTS SUCH AS ALCOHOL   POST-OPERATIVE OPIOID TAPER INSTRUCTIONS:  It is important to wean off of your opioid medication as soon as possible. If you do not need pain medication after your surgery it is ok to stop day one.  Opioids include:  o Codeine, Hydrocodone(Norco, Vicodin), Oxycodone(Percocet, oxycontin) and hydromorphone amongst others.   Long term and even short term use of opiods can cause:  o Increased pain response  o Dependence  o Constipation  o Depression  o Respiratory depression  o And more.   Withdrawal symptoms can include  o Flu like symptoms  o Nausea, vomiting  o And more  Techniques to manage these symptoms  o Hydrate well  o Eat regular healthy meals  o Stay active  o Use relaxation techniques(deep breathing, meditating, yoga)  Do Not substitute Alcohol to help with tapering  If you have been on opioids for less than two weeks and do not have pain than it is ok to stop all together.   Plan to wean off of opioids  o This plan should start within one week post op of your fracture surgery   o Maintain the same interval or time between taking each dose and first decrease the dose.   o Cut the total daily intake of opioids by one tablet each day  o Next start to increase the time between doses.  o The last dose that should be eliminated is the evening dose.    STOP SMOKING OR USING NICOTINE PRODUCTS!!!!  As discussed nicotine severely impairs your body's ability to heal surgical and traumatic wounds but also impairs bone healing.  Wounds and bone heal by forming microscopic blood vessels (angiogenesis) and  nicotine is a vasoconstrictor (essentially, shrinks blood vessels).  Therefore, if vasoconstriction occurs to these microscopic blood vessels they essentially disappear and are unable to deliver necessary nutrients to the healing tissue.  This is one modifiable factor that you can do to dramatically increase your chances of healing your injury.    (This means no smoking, no nicotine gum, patches, etc)  DO NOT USE NONSTEROIDAL ANTI-INFLAMMATORY DRUGS (NSAID'S)  Using products such as Advil (ibuprofen), Aleve (naproxen), Motrin (ibuprofen) for additional pain control during fracture healing can delay and/or prevent the healing response.  If you would like to take over the counter (OTC) medication, Tylenol (acetaminophen) is ok.  However, some narcotic medications that are given for pain control contain acetaminophen as well. Therefore, you should not exceed more than 4000 mg of tylenol in a day if you do not have liver disease.  Also note that there are may OTC medicines, such as cold medicines and allergy medicines that my contain tylenol as well.  If you have any questions about medications and/or interactions please ask your doctor/PA or your pharmacist.      ICE AND  ELEVATE INJURED/OPERATIVE EXTREMITY  Using ice and elevating the injured extremity above your heart can help with swelling and pain control.  Icing in a pulsatile fashion, such as 20 minutes on and 20 minutes off, can be followed.    Do not place ice directly on skin. Make sure there is a barrier between to skin and the ice pack.    Using frozen items such as frozen peas works well as the conform nicely to the are that needs to be iced.  USE AN ACE WRAP OR TED HOSE FOR SWELLING CONTROL  In addition to icing and elevation, Ace wraps or TED hose are used to help limit and resolve swelling.  It is recommended to use Ace wraps or TED hose until you are informed to stop.    When using Ace Wraps start the wrapping distally (farthest away from  the body) and wrap proximally (closer to the body)   Example: If you had surgery on your leg or thing and you do not have a splint on, start the ace wrap at the toes and work your way up to the thigh        If you had surgery on your upper extremity and do not have a splint on, start the ace wrap at your fingers and work your way up to the upper arm  IF YOU ARE IN A SPLINT OR CAST DO NOT REMOVE IT FOR ANY REASON   If your splint gets wet for any reason please contact the office immediately. You may shower in your splint or cast as long as you keep it dry.  This can be done by wrapping in a cast cover or garbage back (or similar)  Do Not stick any thing down your splint or cast such as pencils, money, or hangers to try and scratch yourself with.  If you feel itchy take benadryl as prescribed on the bottle for itching  IF YOU ARE IN A CAM BOOT (BLACK BOOT)  You may remove boot periodically. Perform daily dressing changes as noted below.  Wash the liner of the boot regularly and wear a sock when wearing the boot. It is recommended that you sleep in the boot until told otherwise    Call office for the following: ? Temperature greater than 101F ? Persistent nausea and vomiting ? Severe uncontrolled pain ? Redness, tenderness, or signs of infection (pain, swelling, redness, odor or green/yellow discharge around the site) ? Difficulty breathing, headache or visual disturbances ? Hives ? Persistent dizziness or light-headedness ? Extreme fatigue ? Any other questions or concerns you may have after discharge  In an emergency, call 911 or go to an Emergency Department at a nearby hospital  HELPFUL INFORMATION  ? If you had a block, it will wear off between 8-24 hrs postop typically.  This is period when your pain may go from nearly zero to the pain you would have had postop without the block.  This is an abrupt transition but nothing dangerous is happening.  You may take an extra dose of narcotic  when this happens.  ? You should wean off your narcotic medicines as soon as you are able.  Most patients will be off or using minimal narcotics before their first postop appointment.   ? We suggest you use the pain medication the first night prior to going to bed, in order to ease any pain when the anesthesia wears off. You should avoid taking pain medications on an empty stomach as  it will make you nauseous.  ? Do not drink alcoholic beverages or take illicit drugs when taking pain medications.  ? In most states it is against the law to drive while you are in a splint or sling.  And certainly against the law to drive while taking narcotics.  ? You may return to work/school in the next couple of days when you feel up to it.   ? Pain medication may make you constipated.  Below are a few solutions to try in this order:   ? Decrease the amount of pain medication if you aren't having pain.   ? Drink lots of decaffeinated fluids.   ? Drink prune juice and/or each dried prunes   o If the first 3 don't work start with additional solutions   ? Take Colace - an over-the-counter stool softener   ? Take Senokot - an over-the-counter laxative   ? Take Miralax - a stronger over-the-counter laxative     CALL THE OFFICE WITH ANY QUESTIONS OR CONCERNS: [phone]   VISIT OUR WEBSITE FOR ADDITIONAL INFORMATION: [email or web address]   Driving restrictions   Complete by: As directed    No driving   Increase activity slowly as tolerated   Complete by: As directed    Lifting restrictions   Complete by: As directed    No lifting   Non weight bearing   Complete by: As directed    Laterality: left   Extremity: Upper   Post-operative opioid taper instructions:   Complete by: As directed    POST-OPERATIVE OPIOID TAPER INSTRUCTIONS: It is important to wean off of your opioid medication as soon as possible. If you do not need pain medication after your surgery it is ok to stop day one. Opioids  include: Codeine, Hydrocodone(Norco, Vicodin), Oxycodone(Percocet, oxycontin) and hydromorphone amongst others.  Long term and even short term use of opiods can cause: Increased pain response Dependence Constipation Depression Respiratory depression And more.  Withdrawal symptoms can include Flu like symptoms Nausea, vomiting And more Techniques to manage these symptoms Hydrate well Eat regular healthy meals Stay active Use relaxation techniques(deep breathing, meditating, yoga) Do Not substitute Alcohol to help with tapering If you have been on opioids for less than two weeks and do not have pain than it is ok to stop all together.  Plan to wean off of opioids This plan should start within one week post op of your joint replacement. Maintain the same interval or time between taking each dose and first decrease the dose.  Cut the total daily intake of opioids by one tablet each day Next start to increase the time between doses. The last dose that should be eliminated is the evening dose.         Allergies as of 08/15/2023       Reactions   Lisinopril Cough   Zetia [ezetimibe]    CONFUSION        Medication List     STOP taking these medications    oxyCODONE 5 MG immediate release tablet Commonly known as: Oxy IR/ROXICODONE   Vitamin D (Ergocalciferol) 1.25 MG (50000 UNIT) Caps capsule Commonly known as: DRISDOL       TAKE these medications    alendronate 70 MG tablet Commonly known as: FOSAMAX Take 1 tablet (70 mg total) by mouth once a week. Start taking on: October 15, 2023 What changed: These instructions start on October 15, 2023. If you are unsure what to do until then, ask your doctor or  other care provider.   amLODipine 2.5 MG tablet Commonly known as: NORVASC TAKE 1 TABLET BY MOUTH EVERY DAY   aspirin EC 81 MG tablet Take 81 mg by mouth daily.   calcium carbonate 1500 (600 Ca) MG Tabs tablet Commonly known as: OSCAL Take 600 mg of  elemental calcium by mouth daily with breakfast.   docusate sodium 100 MG capsule Commonly known as: COLACE Take 1 capsule (100 mg total) by mouth 2 (two) times daily.   ferrous sulfate 325 (65 FE) MG tablet Take 325 mg by mouth daily with breakfast.   Gvoke HypoPen 2-Pack 0.5 MG/0.1ML Soaj Generic drug: Glucagon Inject 0.5 mg into the skin daily. As needed for low sugars less than 60 if does not respond oral sugar.   HYDROcodone-acetaminophen 5-325 MG tablet Commonly known as: NORCO/VICODIN Take 1-2 tablets by mouth every 6 (six) hours as needed for moderate pain or severe pain.   ibuprofen 600 MG tablet Commonly known as: ADVIL Take 600 mg by mouth every 6 (six) hours as needed.   insulin aspart 100 UNIT/ML injection Commonly known as: novoLOG Inject 2-3 Units into the skin 3 (three) times daily before meals. Sliding scale Start with 3 units, for 15 carb's add and additional unit   levothyroxine 125 MCG tablet Commonly known as: SYNTHROID TAKE 1 TABLET BY MOUTH EVERY DAY BEFORE BREAKFAST   LORazepam 0.5 MG tablet Commonly known as: ATIVAN TAKE 1 TABLET BY MOUTH EVERY DAY AS NEEDED FOR ANXIETY   methocarbamol 500 MG tablet Commonly known as: ROBAXIN Take 1 tablet (500 mg total) by mouth every 6 (six) hours as needed for muscle spasms.   omeprazole 40 MG capsule Commonly known as: PRILOSEC TAKE 1 CAPSULE BY MOUTH EVERY DAY   OVER THE COUNTER MEDICATION Take 1 tablet by mouth daily. Care 1 stool softener   polyethylene glycol powder 17 GM/SCOOP powder Commonly known as: GLYCOLAX/MIRALAX Take 0.5 Containers by mouth daily. 1 Scoop   propranolol ER 60 MG 24 hr capsule Commonly known as: INDERAL LA TAKE 1 CAPSULE BY MOUTH EVERY DAY   psyllium 58.6 % powder Commonly known as: METAMUCIL Take 1 packet by mouth daily. 1 Scoop   rosuvastatin 40 MG tablet Commonly known as: CRESTOR TAKE 1 TABLET BY MOUTH EVERY DAY   Tresiba FlexTouch 100 UNIT/ML FlexTouch  Pen Generic drug: insulin degludec Inject 19 Units into the skin at bedtime.   valsartan 320 MG tablet Commonly known as: DIOVAN TAKE 1 TABLET BY MOUTH EVERY DAY   venlafaxine XR 150 MG 24 hr capsule Commonly known as: EFFEXOR-XR TAKE 1 CAPSULE BY MOUTH EVERY DAY   vitamin B-12 100 MCG tablet Commonly known as: CYANOCOBALAMIN Take 100 mcg by mouth daily.               Discharge Care Instructions  (From admission, onward)           Start     Ordered   08/15/23 0000  Non weight bearing       Question Answer Comment  Laterality left   Extremity Upper      08/15/23 1203            Follow-up Information     Myrene Galas, MD. Schedule an appointment as soon as possible for a visit in 2 week(s).   Specialty: Orthopedic Surgery Contact information: [address] [place] [address] [phone]                 Discharge Instructions  and Plan: ***  Weightbearing: {weightbearing/status:21254} {affected extremity :21255} Insicional and dressing care: {Insicional and dressing care:21256} Orthopedic device(s): {CHL orthopedic devices:21267::"None"} Showering: *** VTE prophylaxis: {Type:21257} {Duration:21258} Pain control: *** Bone Health/Optimization: *** Follow - up plan: {Follow-up plan:21259} Contact information:  *** MD, *** PA   {[id number]}  Signed:  Mearl Latin, PA-C [phone] (C) 08/15/2023, 12:03 PM  [facility] [address] [place] [address] [phone] [phone] (F)

## 2023-08-15 NOTE — Evaluation (Addendum)
Occupational Therapy Evaluation Patient Details Name: [name] MRN: [id number] DOB: 1951/10/29 Today's Date: 08/15/2023   History of Present Illness The pt is a 72 yo female presenting 9/26 s/p ORIF of L distal humerus fx sustained in a [place] while [place] ~2 weeks prior to admission. PMH includes: hypothyroidism, asthma, HTN, GERD, R dorsal pons infarct, L tibial fx, depression, HLD, OSA, type 1 diabetes, tremor, bilateral knee replacements, total shoulder replacement, and L ankle fx s/p ORIF.   Clinical Impression   Pt currently at min assist level for toileting and toilet transfers without use of an assistive device.  She needs mod assist overall for dressing secondary to not being able to use the LUE functionally.  Provided education on ADLs with limitations but needs further practice.  Pt lives with her son who per pt can assist as needed.  Feel she will benefit from acute care OT at this time in order to increase safety and independence with selfcare tasks.  Recommend HHOT at discharge to continue progression.         If plan is discharge home, recommend the following: A little help with walking and/or transfers;A little help with bathing/dressing/bathroom;Assistance with cooking/housework;Assist for transportation;Help with stairs or ramp for entrance    Functional Status Assessment  Patient has had a recent decline in their functional status and demonstrates the ability to make significant improvements in function in a reasonable and predictable amount of time.  Equipment Recommendations  Tub/shower seat;BSC/3in1       Precautions / Restrictions Precautions Precautions: Fall Precaution Comments: x2 falls in 6 months Required Braces or Orthoses: Sling (at all times except ADLs and exercises) Splint/Cast - Date Prophylactic Dressing Applied (if applicable): 08/14/23 Restrictions Weight Bearing Restrictions: Yes LUE Weight Bearing: Non weight bearing Other  Position/Activity Restrictions: No formal orders, maintained NWB in sling      Mobility Bed Mobility Overal bed mobility: Needs Assistance Bed Mobility: Supine to Sit, Sit to Supine     Supine to sit: Supervision, HOB elevated Sit to supine: Supervision, HOB elevated        Transfers Overall transfer level: Needs assistance Equipment used: 1 person hand held assist Transfers: Sit to/from Stand, Bed to chair/wheelchair/BSC Sit to Stand: Contact guard assist     Step pivot transfers: Min assist     General transfer comment: Pt needed min hand held assist on the left side during mobility.      Balance Overall balance assessment: Needs assistance, History of Falls Sitting-balance support: No upper extremity supported, Feet supported Sitting balance-Leahy Scale: Good     Standing balance support: Single extremity supported, During functional activity Standing balance-Leahy Scale: Poor Standing balance comment: Unilateral UE support needed for mobility                           ADL either performed or assessed with clinical judgement   ADL Overall ADL's : Needs assistance/impaired Eating/Feeding: Set up;Sitting   Grooming: Wash/dry hands;Wash/dry face;Standing;Minimal assistance   Upper Body Bathing: Minimal assistance;Sitting Upper Body Bathing Details (indicate cue type and reason): simulated Lower Body Bathing: Sit to/from stand;Minimal assistance   Upper Body Dressing : Maximal assistance;Sitting Upper Body Dressing Details (indicate cue type and reason): including sling Lower Body Dressing: Moderate assistance;Sit to/from stand Lower Body Dressing Details (indicate cue type and reason): simulated Toilet Transfer: Minimal assistance;Regular Toilet;Grab bars;Ambulation Toilet Transfer Details (indicate cue type and reason): no assistive device Toileting- Clothing Manipulation  and Hygiene: Minimal assistance;Sit to/from stand       Functional  mobility during ADLs: Minimal assistance (ambulation without assistive device) General ADL Comments: Provided education on bathing and dressing techniques following precautions including wearing button up shirts and pull up pants.  Doffed and donned sling with education on importance of not allowing any shoulder motion.  Provided education on completion of wrist and digit flexion/extension to help with edema.  Pt will have her son at home to assist.  Discussed with him during brief phone conversation that pt will need use of a cane per PT for mobility initially and assist for all transfers until [facility] and HHPT come out to see her. He was appreciative and voiced understanding.     Vision Baseline Vision/History: 0 No visual deficits Ability to See in Adequate Light: 0 Adequate Patient Visual Report: No change from baseline Vision Assessment?: No apparent visual deficits     Perception Perception: Not tested       Praxis Praxis: Not tested       Pertinent Vitals/Pain Pain Assessment Pain Assessment: Faces Faces Pain Scale: Hurts even more Pain Location: L elbow Pain Descriptors / Indicators: Aching, Discomfort, Grimacing Pain Intervention(s): Limited activity within patient's tolerance, Patient requesting pain meds-RN notified     Extremity/Trunk Assessment Upper Extremity Assessment Upper Extremity Assessment: LUE deficits/detail LUE Deficits / Details: LUE in long arm splint with no shoulder ROM allowed and elbow ROM limited by confines of splint.  Digit flexion and extension present but limited with slight edema noted.   Lower Extremity Assessment Lower Extremity Assessment: Defer to PT evaluation   Cervical / Trunk Assessment Cervical / Trunk Assessment: Normal   Communication Communication Communication: Hearing impairment Cueing Techniques: Verbal cues;Gestural cues   Cognition Arousal: Alert Behavior During Therapy: Anxious Overall Cognitive Status: No family/caregiver  present to determine baseline cognitive functioning                     Current Attention Level: Sustained Memory: Decreased recall of precautions     Awareness: Intellectual Problem Solving: Requires verbal cues, Slow processing, Difficulty sequencing General Comments: Pt needs mod instructional cuieng for precautions and for the importance of wearing her sling to support the LUE.  Pt with some internal distraction from pain.     General Comments  VSS on RA, pt unable to tolerate LUE elevation, encouraged to move at fingers due to significant bruising and swelling            Home Living Family/patient expects to be discharged to:: Private residence Living Arrangements: Children Available Help at Discharge: Family;Available PRN/intermittently (24/7 initially) Type of Home: Apartment Home Access: Level entry     Home Layout: One level     Bathroom Shower/Tub: Chief Strategy Officer: Standard     Home Equipment: Grab bars - tub/shower   Additional Comments: sleeps on a couch in her living room      Prior Functioning/Environment Prior Level of Function : Independent/Modified Independent;History of Falls (last six months)             Mobility Comments: pt reports independent, x2 falls, once tripping over innertube at pool and once falling down 1 step while walking (did not see it) ADLs Comments: pt reports no issues but also later reports difficulty with ADLs since fx        OT Problem List: Decreased strength;Decreased range of motion;Decreased activity tolerance;Decreased knowledge of use of DME or AE;Decreased knowledge of  precautions;Impaired UE functional use;Decreased safety awareness;Impaired balance (sitting and/or standing)      OT Treatment/Interventions: Self-care/ADL training;Therapeutic exercise;Therapeutic activities;Balance training;Patient/family education    OT Goals(Current goals can be found in the care plan section) Acute  Rehab OT Goals Patient Stated Goal: Pt wants her pain to get better. OT Goal Formulation: With patient Time For Goal Achievement: 08/29/23 Potential to Achieve Goals: Good ADL Goals Pt Will Perform Upper Body Dressing: with mod assist (including sling on the LUE) Pt Will Transfer to Toilet: with supervision;bedside commode;ambulating Pt Will Perform Toileting - Clothing Manipulation and hygiene: with supervision;sit to/from stand Additional ADL Goal #1: Pt's family will verbalize understanding of precautions and techniques to assist with selfcare teasks following the WBing and ROM precautions.  OT Frequency: Min 1X/week       AM-PAC OT "6 Clicks" Daily Activity     Outcome Measure Help from another person eating meals?: A Little Help from another person taking care of personal grooming?: A Little Help from another person toileting, which includes using toliet, bedpan, or urinal?: A Little Help from another person bathing (including washing, rinsing, drying)?: A Little Help from another person to put on and taking off regular upper body clothing?: A Lot Help from another person to put on and taking off regular lower body clothing?: A Lot 6 Click Score: 16   End of Session Equipment Utilized During Treatment: Other (comment) (sling on the LUE) Nurse Communication: Mobility status  Activity Tolerance: Patient limited by pain Patient left: in bed;with call bell/phone within reach  OT Visit Diagnosis: Unsteadiness on feet (R26.81);Other abnormalities of gait and mobility (R26.89);Repeated falls (R29.6);Pain Pain - Right/Left: Left Pain - part of body: Arm                Time: 4098-1191 OT Time Calculation (min): 32 min Charges:  OT General Charges $OT Visit: 1 Visit OT Evaluation $OT Eval Moderate Complexity: 1 Mod OT Treatments $Self Care/Home Management : 8-22 mins Perrin Maltese, OTR/L Acute Rehabilitation Services  Office [phone] 08/15/2023

## 2023-08-16 DIAGNOSIS — S42472A Displaced transcondylar fracture of left humerus, initial encounter for closed fracture: Secondary | ICD-10-CM | POA: Diagnosis not present

## 2023-08-16 LAB — CBC
HCT: 30.3 % — ABNORMAL LOW (ref 36.0–46.0)
Hemoglobin: 9.9 g/dL — ABNORMAL LOW (ref 12.0–15.0)
MCH: 31.8 pg (ref 26.0–34.0)
MCHC: 32.7 g/dL (ref 30.0–36.0)
MCV: 97.4 fL (ref 80.0–100.0)
Platelets: 331 10*3/uL (ref 150–400)
RBC: 3.11 MIL/uL — ABNORMAL LOW (ref 3.87–5.11)
RDW: 12.2 % (ref 11.5–15.5)
WBC: 8.6 10*3/uL (ref 4.0–10.5)
nRBC: 0 % (ref 0.0–0.2)

## 2023-08-16 LAB — GLUCOSE, CAPILLARY: Glucose-Capillary: 206 mg/dL — ABNORMAL HIGH (ref 70–99)

## 2023-08-16 NOTE — Progress Notes (Signed)
Pt requesting for IV dilaudid to be given first, pt was upset when I brought in the hydrocodone. Educated on PO before IV. Pt appears to be confused on pain meds rotations, requests pain meds before time is due. Needs reinforcement.

## 2023-08-16 NOTE — Progress Notes (Signed)
Pt has order for d/c today, pt states that she feels much better this morning and thinks she can go home today.

## 2023-08-16 NOTE — Progress Notes (Signed)
[name] to be D/C'd Home per MD order.  Discussed with the patient and all questions fully answered.  VSS, Skin clean, dry and intact without evidence of skin break down, no evidence of skin tears noted. IV catheter discontinued intact. Site without signs and symptoms of complications. Dressing and pressure applied.  An After Visit Summary was printed and given to the patient. Patient received prescriptions from [facility] pharmacy. Patient received quad cane and refused to take shower/tub seat with her.   D/c education completed with patient/family including follow up instructions, medication list, d/c activities limitations if indicated, with other d/c instructions as indicated by MD - patient able to verbalize understanding, all questions fully answered.   Patient instructed to return to ED, call 911, or call MD for any changes in condition.   Patient escorted via WC, and D/C home via private auto.  [name] 08/16/2023 9:51 AM

## 2023-08-16 NOTE — Plan of Care (Signed)

## 2023-08-16 NOTE — Progress Notes (Signed)
   ORTHOPAEDIC PROGRESS NOTE  s/p Procedure(s): OPEN REDUCTION INTERNAL FIXATION (ORIF) DISTAL HUMERUS FRACTURE WITH OLECRANON OSTEOTOMY  SUBJECTIVE: Pain improved with norco vs oxy. Feels ready to go home today  OBJECTIVE: PE:   Gen: Awake and alert, sitting up in bed, NAD, looks good  Lungs: unlabored Ext:       Left Upper Extremity              LAS fitting well             Ext warm             Moderate swelling             Ecchymosis stable             Good perfusion distally              Radial, ulnar, median nv motor and sensory functions intact             Radial, ulnar, median, AIN, PIN motor intact             No pain with passive stretching of digits  Vitals:   08/15/23 2127 08/16/23 0753  BP: (!) 146/82 (!) 169/84  Pulse: 79 85  Resp: 16 18  Temp: 98.8 F (37.1 C) 98 F (36.7 C)  SpO2: 94% 95%     ASSESSMENT: [name] is a 72 y.o. female doing well postoperatively left intra-articular distal humerus fracture s/p ORIF   PLAN: Weightbearing: NWB LUE, sling at all times Insicional and dressing care: Dressings left intact until follow-up Orthopedic device(s): Splint VTE prophylaxis: Aspirin 81mg  qd  Pain control: Norco Follow - up plan: F/u per ortho trauma  Contact information:  MD on call for orthopedic surgery

## 2023-08-16 NOTE — TOC Transition Note (Signed)
Transition of Care [facility]) - CM/SW Discharge Note   Patient Details  Name: Koula Venier MRN: [id number] Date of Birth: [birth year]  [facility] CM/SW Contact:  Ronny Bacon, RN Phone Number: 08/16/2023, 7:34 AM   Clinical Narrative:  Patient is being discharged today. Amy- [facility] notified by text message.     Final next level of care: Home w Home Health Services Barriers to Discharge: No Barriers Identified   Patient Goals and CMS Choice   Choice offered to / list presented to : Patient  Discharge Placement                         Discharge Plan and Services Additional resources added to the After Visit Summary for     Discharge Planning Services: CM Consult            DME Arranged: Bedside commode, Cane (shower Laury Deep seat) DME Agency: [employer] Date DME Agency Contacted: 08/15/23 Time DME Agency Contacted: [phone]   HH Arranged: PT, OT HH Agency: [facility] Date HH Agency Contacted: 08/15/23 Time HH Agency Contacted: 1412 Representative spoke with at [facility] Agency: Amy  Social Determinants of Health (SDOH) Interventions SDOH Screenings   Food Insecurity: No Food Insecurity (08/15/2023)  Housing: Low Risk  (08/15/2023)  Transportation Needs: No Transportation Needs (08/15/2023)  Utilities: Not At Risk (08/15/2023)  Alcohol Screen: Low Risk  (11/04/2022)  Depression (PHQ2-9): Low Risk  (05/27/2023)  Tobacco Use: Low Risk  (08/14/2023)     Readmission Risk Interventions     No data to display

## 2023-08-17 ENCOUNTER — Encounter (HOSPITAL_COMMUNITY): Payer: Self-pay | Admitting: Orthopedic Surgery

## 2023-08-19 DIAGNOSIS — Z7985 Long-term (current) use of injectable non-insulin antidiabetic drugs: Secondary | ICD-10-CM | POA: Diagnosis not present

## 2023-08-19 DIAGNOSIS — F339 Major depressive disorder, recurrent, unspecified: Secondary | ICD-10-CM | POA: Diagnosis not present

## 2023-08-19 DIAGNOSIS — Z794 Long term (current) use of insulin: Secondary | ICD-10-CM | POA: Diagnosis not present

## 2023-08-19 DIAGNOSIS — G4733 Obstructive sleep apnea (adult) (pediatric): Secondary | ICD-10-CM | POA: Diagnosis not present

## 2023-08-19 DIAGNOSIS — I10 Essential (primary) hypertension: Secondary | ICD-10-CM | POA: Diagnosis not present

## 2023-08-19 DIAGNOSIS — S42472D Displaced transcondylar fracture of left humerus, subsequent encounter for fracture with routine healing: Secondary | ICD-10-CM | POA: Diagnosis not present

## 2023-08-19 DIAGNOSIS — E1065 Type 1 diabetes mellitus with hyperglycemia: Secondary | ICD-10-CM | POA: Diagnosis not present

## 2023-08-19 DIAGNOSIS — E039 Hypothyroidism, unspecified: Secondary | ICD-10-CM | POA: Diagnosis not present

## 2023-08-19 DIAGNOSIS — J45909 Unspecified asthma, uncomplicated: Secondary | ICD-10-CM | POA: Diagnosis not present

## 2023-08-19 DIAGNOSIS — I69393 Ataxia following cerebral infarction: Secondary | ICD-10-CM | POA: Diagnosis not present

## 2023-08-19 HISTORY — PX: OTHER SURGICAL HISTORY: SHX169

## 2023-08-26 ENCOUNTER — Other Ambulatory Visit: Payer: Self-pay | Admitting: Family Medicine

## 2023-08-26 DIAGNOSIS — R251 Tremor, unspecified: Secondary | ICD-10-CM

## 2023-08-27 DIAGNOSIS — S42472D Displaced transcondylar fracture of left humerus, subsequent encounter for fracture with routine healing: Secondary | ICD-10-CM | POA: Diagnosis not present

## 2023-08-29 ENCOUNTER — Other Ambulatory Visit: Payer: PPO

## 2023-08-29 DIAGNOSIS — M810 Age-related osteoporosis without current pathological fracture: Secondary | ICD-10-CM | POA: Diagnosis not present

## 2023-08-29 DIAGNOSIS — E1065 Type 1 diabetes mellitus with hyperglycemia: Secondary | ICD-10-CM | POA: Diagnosis not present

## 2023-08-29 NOTE — Progress Notes (Signed)
Patient could not urinate while here at the office, sent home with cup to bring back to the office for Micro to be done, and sent to [facility] per orders from [facility] endocrinology.

## 2023-08-30 LAB — COMPREHENSIVE METABOLIC PANEL
ALT: 14 [IU]/L (ref 0–32)
AST: 15 [IU]/L (ref 0–40)
Albumin: 4.2 g/dL (ref 3.8–4.8)
Alkaline Phosphatase: 188 [IU]/L — ABNORMAL HIGH (ref 44–121)
BUN/Creatinine Ratio: 19 (ref 12–28)
BUN: 16 mg/dL (ref 8–27)
Bilirubin Total: 0.3 mg/dL (ref 0.0–1.2)
CO2: 25 mmol/L (ref 20–29)
Calcium: 9.9 mg/dL (ref 8.7–10.3)
Chloride: 98 mmol/L (ref 96–106)
Creatinine, Ser: 0.83 mg/dL (ref 0.57–1.00)
Globulin, Total: 2.5 g/dL (ref 1.5–4.5)
Glucose: 83 mg/dL (ref 70–99)
Potassium: 4.4 mmol/L (ref 3.5–5.2)
Sodium: 139 mmol/L (ref 134–144)
Total Protein: 6.7 g/dL (ref 6.0–8.5)
eGFR: 74 mL/min/{1.73_m2} (ref >59)

## 2023-09-01 LAB — MICROALBUMIN / CREATININE URINE RATIO

## 2023-09-01 LAB — HEMOGLOBIN A1C
Est. average glucose Bld gHb Est-mCnc: 197 mg/dL
Hgb A1c MFr Bld: 8.5 % — ABNORMAL HIGH (ref 4.8–5.6)

## 2023-09-01 LAB — VITAMIN D 25 HYDROXY (VIT D DEFICIENCY, FRACTURES): Vit D, 25-Hydroxy: 85.8 ng/mL (ref 30.0–100.0)

## 2023-09-02 ENCOUNTER — Other Ambulatory Visit: Payer: Self-pay

## 2023-09-02 ENCOUNTER — Telehealth: Payer: Self-pay

## 2023-09-02 DIAGNOSIS — E1065 Type 1 diabetes mellitus with hyperglycemia: Secondary | ICD-10-CM | POA: Diagnosis not present

## 2023-09-02 DIAGNOSIS — M62542 Muscle wasting and atrophy, not elsewhere classified, left hand: Secondary | ICD-10-CM | POA: Diagnosis not present

## 2023-09-02 DIAGNOSIS — M25522 Pain in left elbow: Secondary | ICD-10-CM | POA: Diagnosis not present

## 2023-09-02 DIAGNOSIS — M25422 Effusion, left elbow: Secondary | ICD-10-CM | POA: Diagnosis not present

## 2023-09-02 DIAGNOSIS — M25622 Stiffness of left elbow, not elsewhere classified: Secondary | ICD-10-CM | POA: Diagnosis not present

## 2023-09-02 NOTE — Assessment & Plan Note (Signed)
Well controlled.  No changes to medicines. Continue crestor 40 mg before bed.  Continue to work on eating a healthy diet and exercise.

## 2023-09-02 NOTE — Telephone Encounter (Signed)
Patient dropped off urine specimen for microalbumin. Was unable to provide urine specimen at time of lab draw on 08/29/23. Order placed today. Will fax results to [facility] once available.

## 2023-09-02 NOTE — Assessment & Plan Note (Signed)
Managed by Endocrinology. Continue Novolog sliding scaled before meals, and at bedtime. Continue Tresiba 19 units daily. Daily foot checks.

## 2023-09-02 NOTE — Assessment & Plan Note (Signed)
Continue synthroid 125 mcg daily.   Obtain recent labs from outside provider.

## 2023-09-02 NOTE — Progress Notes (Signed)
Subjective:  Patient ID: [name], female    DOB: Dec 14, 1950  Age: 72 y.o. MRN: [id number]  Chief Complaint  Patient presents with   Medical Management of Chronic Issues    HPI HTN: Norvasc 2.5 mg daily, Propranolol 60 mg daily and diovan 320 mg daily.   DMI: Novolog 2-3 units before meals, 6 units q a.m., 6 units at noon and 3 units at bedtime, tresiba 19 units daily. A1C 8.5. Checks feet daily also see's  Podiatry. Patient saw her endocrinologist virtually yesterday.   Hypothyroid: Synthroid 125 mcg daily, last TSH 1.4   GERD: Prilosec 40 mg daily   Cholesterol: Crestor 40 mg daily   Depression/Anxiety: Effexor 150 mg daily, Ativan 0.5 mg as needed   Osteoporosis:  Alendronate 70 mg weekly. Vitamin D is good, but orthopedic changed prescription vitamin D to otc vitamin D. .   One month ago patient was visiting her daughter in [place] and fell coming out of a building. She fractured her left elbow. [place] [facility] [place] [facility]. Hurts a lot still. Going to physical therapy.   Right hip hurts. Left foot turns out. This is new. Sees Dr. Marylene Land.      09/04/2023    3:21 PM 05/27/2023   11:02 AM 12/16/2022   10:11 AM 12/16/2022   10:10 AM 08/12/2022   10:38 AM  Depression screen PHQ 2/9  Decreased Interest 0 0 0 0 0  Down, Depressed, Hopeless 0 0 0 0 0  PHQ - 2 Score 0 0 0 0 0  Altered sleeping 0 0 0  2  Tired, decreased energy 0 0 0  0  Change in appetite 0 0 0  3  Feeling bad or failure about yourself  0 0 0  0  Trouble concentrating 0 0 0  0  Moving slowly or fidgety/restless 0 0 0  0  Suicidal thoughts 0 0 0  0  PHQ-9 Score 0 0 0  5  Difficult doing work/chores Not difficult at all Not difficult at all Not difficult at all  Not difficult at all        05/27/2023   11:02 AM  Fall Risk   Falls in the past year? 0  Number falls in past yr: 0  Injury with Fall? 0  Risk for fall due to : No Fall Risks  Follow up Falls evaluation  completed;Falls prevention discussed    Patient Care Team: Blane Ohara, MD as PCP - General (Family Medicine) Baldo Daub, MD as PCP - Cardiology (Cardiology) Van Clines, MD as Consulting Physician (Neurology) Tedd Sias Marlana Salvage, MD as Physician Assistant (Endocrinology) Asencion Islam, DPM as Referring Physician (Podiatry)   Review of Systems  Constitutional:  Negative for chills, fatigue and fever.  HENT:  Negative for congestion, ear pain, rhinorrhea and sore throat.   Respiratory:  Negative for cough and shortness of breath.   Cardiovascular:  Negative for chest pain.  Gastrointestinal:  Negative for abdominal pain, constipation, diarrhea, nausea and vomiting.  Genitourinary:  Negative for dysuria and urgency.  Musculoskeletal:  Negative for back pain and myalgias.  Neurological:  Negative for dizziness, weakness, light-headedness and headaches.  Psychiatric/Behavioral:  Negative for dysphoric mood. The patient is not nervous/anxious.     Current Outpatient Medications on File Prior to Visit  Medication Sig Dispense Refill   [START ON 10/15/2023] alendronate (FOSAMAX) 70 MG tablet Take 1 tablet (70 mg total) by mouth once a week.  amLODipine (NORVASC) 2.5 MG tablet TAKE 1 TABLET BY MOUTH EVERY DAY 90 tablet 1   aspirin EC 81 MG tablet Take 81 mg by mouth daily.     calcium carbonate (OSCAL) 1500 (600 Ca) MG TABS tablet Take 600 mg of elemental calcium by mouth daily with breakfast.     docusate sodium (COLACE) 100 MG capsule Take 1 capsule (100 mg total) by mouth 2 (two) times daily for 5 days then as directed by MD 100 capsule 0   ferrous sulfate 325 (65 FE) MG tablet Take 325 mg by mouth daily with breakfast.     Glucagon (GVOKE HYPOPEN 2-PACK) 0.5 MG/0.1ML SOAJ Inject 0.5 mg into the skin daily. As needed for low sugars less than 60 if does not respond oral sugar. 0.2 mL 2   HYDROcodone-acetaminophen (NORCO/VICODIN) 5-325 MG tablet Take 1-2 tablets by mouth every 6  (six) hours as needed for moderate pain or severe pain. 50 tablet 0   ibuprofen (ADVIL) 600 MG tablet Take 600 mg by mouth every 6 (six) hours as needed.     insulin aspart (NOVOLOG) 100 UNIT/ML injection Inject 2-3 Units into the skin 3 (three) times daily before meals. Sliding scale Start with 3 units, for 15 carb's add and additional unit     insulin degludec (TRESIBA FLEXTOUCH) 100 UNIT/ML FlexTouch Pen Inject 19 Units into the skin at bedtime.     levothyroxine (SYNTHROID) 125 MCG tablet TAKE 1 TABLET BY MOUTH EVERY DAY BEFORE BREAKFAST 90 tablet 1   methocarbamol (ROBAXIN) 500 MG tablet Take 1 tablet (500 mg total) by mouth every 6 (six) hours as needed for muscle spasms. 50 tablet 0   omeprazole (PRILOSEC) 40 MG capsule TAKE 1 CAPSULE BY MOUTH EVERY DAY 90 capsule 3   OVER THE COUNTER MEDICATION Take 1 tablet by mouth daily. Care 1 stool softener     polyethylene glycol powder (GLYCOLAX/MIRALAX) 17 GM/SCOOP powder Take 0.5 Containers by mouth daily. 1 Scoop     propranolol ER (INDERAL LA) 60 MG 24 hr capsule TAKE 1 CAPSULE BY MOUTH EVERY DAY 90 capsule 1   psyllium (METAMUCIL) 58.6 % powder Take 1 packet by mouth daily. 1 Scoop     rosuvastatin (CRESTOR) 40 MG tablet TAKE 1 TABLET BY MOUTH EVERY DAY 90 tablet 1   valsartan (DIOVAN) 320 MG tablet TAKE 1 TABLET BY MOUTH EVERY DAY 90 tablet 0   venlafaxine XR (EFFEXOR-XR) 150 MG 24 hr capsule TAKE 1 CAPSULE BY MOUTH EVERY DAY 90 capsule 1   vitamin B-12 (CYANOCOBALAMIN) 100 MCG tablet Take 100 mcg by mouth daily.     No current facility-administered medications on file prior to visit.   Past Medical History:  Diagnosis Date   Abdominal bloating 08/12/2022   Acquired hypothyroidism 12/23/2021   Acute respiratory failure with hypoxemia 04/20/2014   Asthma 08/14/2015   Ataxia 12/23/2021   Capsulitis of metatarsophalangeal (MTP) joint of left foot 01/22/2018   Cough due to ACE inhibitor 09/30/2021   Daytime somnolence 12/23/2021    Essential hypertension 04/15/2014   External hemorrhoid 11/04/2022   GERD (gastroesophageal reflux disease)    Hearing loss of left ear due to cerumen impaction 10/25/2021   Imbalance 07/11/2022   Lacunar infarction 01/10/2021   right dorsal pons   Left tibial fracture 08/03/2019   Major depressive disorder 01/21/2022   Mixed hyperlipidemia 02/17/2022   Nocturia 09/30/2021   Obstructive sleep apnea    No CPAP   Primary localized osteoarthrosis, lower leg 04/18/2014  Snoring 09/30/2021   Tremor 06/29/2022   Type 1 diabetes mellitus with hyperglycemia 09/30/2021   Urine WBC increased 07/11/2022   Past Surgical History:  Procedure Laterality Date   APPENDECTOMY     APPLICATION OF WOUND VAC Left 08/03/2019   Procedure: Application Of Wound Vac;  Surgeon: Myrene Galas, MD;  Location: [facility] OR;  Service: Orthopedics;  Laterality: Left;   CESAREAN SECTION     ESOPHAGOGASTRODUODENOSCOPY (EGD) WITH PROPOFOL N/A 02/02/2016   Procedure: ESOPHAGOGASTRODUODENOSCOPY (EGD) WITH PROPOFOL;  Surgeon: Elnita Maxwell, MD;  Location: [facility] ENDOSCOPY;  Service: Endoscopy;  Laterality: N/A;   HAND SURGERY     JOINT REPLACEMENT Bilateral    Knee    ORIF ANKLE FRACTURE Left 08/03/2019   with wound vac applied    ORIF ANKLE FRACTURE Left 08/03/2019   Procedure: OPEN REDUCTION INTERNAL FIXATION (ORIF) ANKLE FRACTURE;  Surgeon: Myrene Galas, MD;  Location: [facility] OR;  Service: Orthopedics;  Laterality: Left;   ORIF HUMERUS FRACTURE Left 08/14/2023   Procedure: OPEN REDUCTION INTERNAL FIXATION (ORIF) DISTAL HUMERUS FRACTURE WITH OLECRANON OSTEOTOMY;  Surgeon: Myrene Galas, MD;  Location: [facility] OR;  Service: Orthopedics;  Laterality: Left;   TIBIALIS TENDON TRANSFER / REPAIR     TOTAL SHOULDER REPLACEMENT      Family History  Problem Relation Age of Onset   Cancer Mother        Colon cancer    Dementia Father        mid 84s, unspecified type   Neuropathy Father    Schizophrenia Brother    Social  History   Socioeconomic History   Marital status: Single    Spouse name: Not on file   Number of children: 2   Years of education: 16   Highest education level: Bachelor's degree (e.g., BA, AB, BS)  Occupational History   Occupation: Retired    Comment: Runner, broadcasting/film/video  Tobacco Use   Smoking status: Never   Smokeless tobacco: Never  Vaping Use   Vaping status: Never Used  Substance and Sexual Activity   Alcohol use: Not Currently   Drug use: Never   Sexual activity: Not Currently  Other Topics Concern   Not on file  Social History Narrative   Right hand   Lives alone   Drinks caffeine   An apartment building   Social Determinants of Health   Financial Resource Strain: Low Risk  (09/04/2023)   Overall Financial Resource Strain (CARDIA)    Difficulty of Paying Living Expenses: Not hard at all  Food Insecurity: No Food Insecurity (08/15/2023)   Hunger Vital Sign    Worried About Running Out of Food in the Last Year: Never true    Ran Out of Food in the Last Year: Never true  Transportation Needs: No Transportation Needs (08/15/2023)   PRAPARE - Administrator, Civil Service (Medical): No    Lack of Transportation (Non-Medical): No  Physical Activity: Sufficiently Active (09/04/2023)   Exercise Vital Sign    Days of Exercise per Week: 7 days    Minutes of Exercise per Session: 30 min  Stress: No Stress Concern Present (09/04/2023)   [employer] of Occupational Health - Occupational Stress Questionnaire    Feeling of Stress : Not at all  Social Connections: Socially Isolated (09/04/2023)   Social Connection and Isolation Panel [NHANES]    Frequency of Communication with Friends and Family: Three times a week    Frequency of Social Gatherings with Friends and Family: Three times a  week    Attends Religious Services: Never    Active Member of Clubs or Organizations: No    Attends Engineer, structural: Never    Marital Status: Divorced    Objective:   BP 134/78   Pulse 83   Temp (!) 97.3 F (36.3 C)   Ht 5' 3.5" (1.613 m)   Wt 172 lb (78 kg)   SpO2 99%   BMI 29.99 kg/m      09/04/2023    3:14 PM 08/16/2023    7:53 AM 08/15/2023    9:27 PM  BP/Weight  Systolic BP 134 169 146  Diastolic BP 78 84 82  Wt. (Lbs) 172    BMI 29.99 kg/m2      Physical Exam Vitals reviewed.  Constitutional:      Appearance: Normal appearance.  Neck:     Vascular: No carotid bruit.  Cardiovascular:     Rate and Rhythm: Normal rate and regular rhythm.     Heart sounds: Normal heart sounds.  Pulmonary:     Effort: Pulmonary effort is normal. No respiratory distress.     Breath sounds: Normal breath sounds.  Abdominal:     General: Abdomen is flat. Bowel sounds are normal.     Palpations: Abdomen is soft.     Tenderness: There is no abdominal tenderness.  Musculoskeletal:     Comments: Left foot turns out. Good strength BL with plantar flexion and dorsiflexion. FROM of left knee and left hip. Knee nontender.   Neurological:     Mental Status: She is alert and oriented to person, place, and time.  Psychiatric:        Mood and Affect: Mood normal.        Behavior: Behavior normal.     Diabetic Foot Exam - Simple   Simple Foot Form Diabetic Foot exam was performed with the following findings: Yes 09/04/2023  3:56 PM  Visual Inspection See comments: Yes Sensation Testing Intact to touch and monofilament testing bilaterally: Yes Pulse Check Posterior Tibialis and Dorsalis pulse intact bilaterally: Yes Comments Left medial metatarsal head of first digit cracking dry skin.           Lab Results  Component Value Date   WBC 8.6 08/16/2023   HGB 9.9 (L) 08/16/2023   HCT 30.3 (L) 08/16/2023   PLT 331 08/16/2023   GLUCOSE 83 08/29/2023   CHOL 208 (H) 05/30/2023   TRIG 108 05/30/2023   HDL 93 05/30/2023   LDLCALC 96 05/30/2023   ALT 14 08/29/2023   AST 15 08/29/2023   NA 139 08/29/2023   K 4.4 08/29/2023   CL 98 08/29/2023    CREATININE 0.83 08/29/2023   BUN 16 08/29/2023   CO2 25 08/29/2023   TSH 3.440 05/30/2023   INR 0.9 08/14/2023   HGBA1C 8.5 (H) 08/29/2023      Assessment & Plan:    Acquired hypothyroidism Assessment & Plan: Continue synthroid 125 mcg daily.   Obtain recent labs from outside provider.     Type 1 diabetes mellitus with hyperglycemia [facility]) Assessment & Plan: Managed by Endocrinology. Continue Novolog sliding scaled before meals, and at bedtime. Continue Tresiba 19 units daily. Daily foot checks.    Orders: -     Microalbumin / creatinine urine ratio  Gastroesophageal reflux disease without esophagitis Assessment & Plan: Continue prilosec 40 mg daily.   Continue healthy diet and exercise.    Mixed hyperlipidemia Assessment & Plan: Well controlled.  No changes to medicines.  Continue crestor 40 mg before bed.  Continue to work on eating a healthy diet and exercise.     Essential hypertension, benign Assessment & Plan: Continue norvasc 2.5 mg daily, propranolol 60 mg daily and diovan 320 mg daily. Healthy diet and exercise.     Encounter for screening mammogram for malignant neoplasm of breast  Mild recurrent major depression (HCC) Assessment & Plan: The current medical regimen is effective;  continue present plan and medications. Continue  Effexor 150 mg daily, Ativan 0.5 mg as needed.    Gait abnormality Assessment & Plan: Due to left foot turning out.  Does not seem to be related to hip or knee.  Follow up with Dr. Marylene Land.    Other orders -     LORazepam; Take 1 tablet (0.5 mg total) by mouth at bedtime.  Dispense: 30 tablet; Refill: 1     Meds ordered this encounter  Medications   LORazepam (ATIVAN) 0.5 MG tablet    Sig: Take 1 tablet (0.5 mg total) by mouth at bedtime.    Dispense:  30 tablet    Refill:  1    Not to exceed 4 additional fills before 10/06/2023    Orders Placed This Encounter  Procedures   Microalbumin / creatinine urine  ratio     Follow-up: Return in about 3 months (around 12/05/2023) for chronic follow up.   I,Katherina A Bramblett,acting as a scribe for Blane Ohara, MD.,have documented all relevant documentation on the behalf of Blane Ohara, MD,as directed by  Blane Ohara, MD while in the presence of Blane Ohara, MD.   An After Visit Summary was printed and given to the patient.  Blane Ohara, MD [facility] [phone]

## 2023-09-02 NOTE — Assessment & Plan Note (Signed)
Continue norvasc 2.5 mg daily, propranolol 60 mg daily and diovan 320 mg daily. Healthy diet and exercise.

## 2023-09-02 NOTE — Assessment & Plan Note (Signed)
Continue prilosec 40 mg daily.   Continue healthy diet and exercise.

## 2023-09-03 DIAGNOSIS — Z794 Long term (current) use of insulin: Secondary | ICD-10-CM | POA: Diagnosis not present

## 2023-09-03 DIAGNOSIS — E782 Mixed hyperlipidemia: Secondary | ICD-10-CM | POA: Diagnosis not present

## 2023-09-03 DIAGNOSIS — E10319 Type 1 diabetes mellitus with unspecified diabetic retinopathy without macular edema: Secondary | ICD-10-CM | POA: Diagnosis not present

## 2023-09-03 DIAGNOSIS — M810 Age-related osteoporosis without current pathological fracture: Secondary | ICD-10-CM | POA: Diagnosis not present

## 2023-09-03 DIAGNOSIS — E039 Hypothyroidism, unspecified: Secondary | ICD-10-CM | POA: Diagnosis not present

## 2023-09-03 DIAGNOSIS — Z8781 Personal history of (healed) traumatic fracture: Secondary | ICD-10-CM | POA: Diagnosis not present

## 2023-09-03 LAB — MICROALBUMIN / CREATININE URINE RATIO
Creatinine, Urine: 37.6 mg/dL
Microalb/Creat Ratio: 17 mg/g{creat} (ref 0–29)
Microalbumin, Urine: 6.4 ug/mL

## 2023-09-04 ENCOUNTER — Ambulatory Visit: Payer: PPO | Admitting: Family Medicine

## 2023-09-04 ENCOUNTER — Encounter: Payer: Self-pay | Admitting: Family Medicine

## 2023-09-04 VITALS — BP 134/78 | HR 83 | Temp 97.3°F | Ht 63.5 in | Wt 172.0 lb

## 2023-09-04 DIAGNOSIS — E1159 Type 2 diabetes mellitus with other circulatory complications: Secondary | ICD-10-CM

## 2023-09-04 DIAGNOSIS — Z1231 Encounter for screening mammogram for malignant neoplasm of breast: Secondary | ICD-10-CM | POA: Diagnosis not present

## 2023-09-04 DIAGNOSIS — E782 Mixed hyperlipidemia: Secondary | ICD-10-CM | POA: Diagnosis not present

## 2023-09-04 DIAGNOSIS — E039 Hypothyroidism, unspecified: Secondary | ICD-10-CM | POA: Diagnosis not present

## 2023-09-04 DIAGNOSIS — F330 Major depressive disorder, recurrent, mild: Secondary | ICD-10-CM | POA: Diagnosis not present

## 2023-09-04 DIAGNOSIS — I10 Essential (primary) hypertension: Secondary | ICD-10-CM

## 2023-09-04 DIAGNOSIS — F02A18 Dementia in other diseases classified elsewhere, mild, with other behavioral disturbance: Secondary | ICD-10-CM

## 2023-09-04 DIAGNOSIS — R269 Unspecified abnormalities of gait and mobility: Secondary | ICD-10-CM | POA: Diagnosis not present

## 2023-09-04 DIAGNOSIS — E1065 Type 1 diabetes mellitus with hyperglycemia: Secondary | ICD-10-CM

## 2023-09-04 DIAGNOSIS — K219 Gastro-esophageal reflux disease without esophagitis: Secondary | ICD-10-CM | POA: Diagnosis not present

## 2023-09-04 MED ORDER — LORAZEPAM 0.5 MG PO TABS: 0.5000 mg | ORAL_TABLET | Freq: Every day | ORAL | 1 refills | Status: DC

## 2023-09-05 DIAGNOSIS — M62542 Muscle wasting and atrophy, not elsewhere classified, left hand: Secondary | ICD-10-CM | POA: Diagnosis not present

## 2023-09-05 DIAGNOSIS — M25622 Stiffness of left elbow, not elsewhere classified: Secondary | ICD-10-CM | POA: Diagnosis not present

## 2023-09-05 DIAGNOSIS — M25522 Pain in left elbow: Secondary | ICD-10-CM | POA: Diagnosis not present

## 2023-09-05 DIAGNOSIS — M25422 Effusion, left elbow: Secondary | ICD-10-CM | POA: Diagnosis not present

## 2023-09-06 DIAGNOSIS — F0280 Dementia in other diseases classified elsewhere without behavioral disturbance: Secondary | ICD-10-CM | POA: Insufficient documentation

## 2023-09-07 DIAGNOSIS — R269 Unspecified abnormalities of gait and mobility: Secondary | ICD-10-CM | POA: Insufficient documentation

## 2023-09-07 DIAGNOSIS — Z1231 Encounter for screening mammogram for malignant neoplasm of breast: Secondary | ICD-10-CM | POA: Insufficient documentation

## 2023-09-07 DIAGNOSIS — F330 Major depressive disorder, recurrent, mild: Secondary | ICD-10-CM | POA: Insufficient documentation

## 2023-09-07 NOTE — Assessment & Plan Note (Signed)
The current medical regimen is effective;  continue present plan and medications. Continue  Effexor 150 mg daily, Ativan 0.5 mg as needed.

## 2023-09-07 NOTE — Assessment & Plan Note (Signed)
Due to left foot turning out.  Does not seem to be related to hip or knee.  Follow up with Dr. Marylene Land.

## 2023-09-08 DIAGNOSIS — E119 Type 2 diabetes mellitus without complications: Secondary | ICD-10-CM | POA: Diagnosis not present

## 2023-09-08 DIAGNOSIS — M25571 Pain in right ankle and joints of right foot: Secondary | ICD-10-CM | POA: Diagnosis not present

## 2023-09-08 DIAGNOSIS — R2681 Unsteadiness on feet: Secondary | ICD-10-CM | POA: Diagnosis not present

## 2023-09-08 DIAGNOSIS — M21969 Unspecified acquired deformity of unspecified lower leg: Secondary | ICD-10-CM | POA: Diagnosis not present

## 2023-09-10 DIAGNOSIS — S42472D Displaced transcondylar fracture of left humerus, subsequent encounter for fracture with routine healing: Secondary | ICD-10-CM | POA: Diagnosis not present

## 2023-09-10 DIAGNOSIS — M25622 Stiffness of left elbow, not elsewhere classified: Secondary | ICD-10-CM | POA: Diagnosis not present

## 2023-09-10 DIAGNOSIS — M25522 Pain in left elbow: Secondary | ICD-10-CM | POA: Diagnosis not present

## 2023-09-10 DIAGNOSIS — M62542 Muscle wasting and atrophy, not elsewhere classified, left hand: Secondary | ICD-10-CM | POA: Diagnosis not present

## 2023-09-10 DIAGNOSIS — M25422 Effusion, left elbow: Secondary | ICD-10-CM | POA: Diagnosis not present

## 2023-09-11 DIAGNOSIS — E1065 Type 1 diabetes mellitus with hyperglycemia: Secondary | ICD-10-CM | POA: Diagnosis not present

## 2023-09-11 DIAGNOSIS — E10319 Type 1 diabetes mellitus with unspecified diabetic retinopathy without macular edema: Secondary | ICD-10-CM | POA: Diagnosis not present

## 2023-09-12 DIAGNOSIS — M25422 Effusion, left elbow: Secondary | ICD-10-CM | POA: Diagnosis not present

## 2023-09-12 DIAGNOSIS — M25522 Pain in left elbow: Secondary | ICD-10-CM | POA: Diagnosis not present

## 2023-09-12 DIAGNOSIS — M25622 Stiffness of left elbow, not elsewhere classified: Secondary | ICD-10-CM | POA: Diagnosis not present

## 2023-09-12 DIAGNOSIS — M62542 Muscle wasting and atrophy, not elsewhere classified, left hand: Secondary | ICD-10-CM | POA: Diagnosis not present

## 2023-09-15 DIAGNOSIS — M25422 Effusion, left elbow: Secondary | ICD-10-CM | POA: Diagnosis not present

## 2023-09-15 DIAGNOSIS — M25622 Stiffness of left elbow, not elsewhere classified: Secondary | ICD-10-CM | POA: Diagnosis not present

## 2023-09-15 DIAGNOSIS — M62542 Muscle wasting and atrophy, not elsewhere classified, left hand: Secondary | ICD-10-CM | POA: Diagnosis not present

## 2023-09-15 DIAGNOSIS — M25522 Pain in left elbow: Secondary | ICD-10-CM | POA: Diagnosis not present

## 2023-09-17 DIAGNOSIS — M25422 Effusion, left elbow: Secondary | ICD-10-CM | POA: Diagnosis not present

## 2023-09-17 DIAGNOSIS — M25522 Pain in left elbow: Secondary | ICD-10-CM | POA: Diagnosis not present

## 2023-09-17 DIAGNOSIS — M62542 Muscle wasting and atrophy, not elsewhere classified, left hand: Secondary | ICD-10-CM | POA: Diagnosis not present

## 2023-09-17 DIAGNOSIS — M25622 Stiffness of left elbow, not elsewhere classified: Secondary | ICD-10-CM | POA: Diagnosis not present

## 2023-09-18 DIAGNOSIS — E10319 Type 1 diabetes mellitus with unspecified diabetic retinopathy without macular edema: Secondary | ICD-10-CM | POA: Diagnosis not present

## 2023-09-22 DIAGNOSIS — M25422 Effusion, left elbow: Secondary | ICD-10-CM | POA: Diagnosis not present

## 2023-09-22 DIAGNOSIS — M19012 Primary osteoarthritis, left shoulder: Secondary | ICD-10-CM | POA: Diagnosis not present

## 2023-09-22 DIAGNOSIS — M62542 Muscle wasting and atrophy, not elsewhere classified, left hand: Secondary | ICD-10-CM | POA: Diagnosis not present

## 2023-09-22 DIAGNOSIS — M25522 Pain in left elbow: Secondary | ICD-10-CM | POA: Diagnosis not present

## 2023-09-22 DIAGNOSIS — M25622 Stiffness of left elbow, not elsewhere classified: Secondary | ICD-10-CM | POA: Diagnosis not present

## 2023-09-22 DIAGNOSIS — M25512 Pain in left shoulder: Secondary | ICD-10-CM | POA: Diagnosis not present

## 2023-09-24 DIAGNOSIS — M25622 Stiffness of left elbow, not elsewhere classified: Secondary | ICD-10-CM | POA: Diagnosis not present

## 2023-09-24 DIAGNOSIS — M25422 Effusion, left elbow: Secondary | ICD-10-CM | POA: Diagnosis not present

## 2023-09-24 DIAGNOSIS — M62542 Muscle wasting and atrophy, not elsewhere classified, left hand: Secondary | ICD-10-CM | POA: Diagnosis not present

## 2023-09-24 DIAGNOSIS — S42472D Displaced transcondylar fracture of left humerus, subsequent encounter for fracture with routine healing: Secondary | ICD-10-CM | POA: Diagnosis not present

## 2023-09-24 DIAGNOSIS — M25522 Pain in left elbow: Secondary | ICD-10-CM | POA: Diagnosis not present

## 2023-09-25 ENCOUNTER — Other Ambulatory Visit: Payer: Self-pay | Admitting: Family Medicine

## 2023-09-29 DIAGNOSIS — M25622 Stiffness of left elbow, not elsewhere classified: Secondary | ICD-10-CM | POA: Diagnosis not present

## 2023-09-29 DIAGNOSIS — M25522 Pain in left elbow: Secondary | ICD-10-CM | POA: Diagnosis not present

## 2023-09-29 DIAGNOSIS — M25422 Effusion, left elbow: Secondary | ICD-10-CM | POA: Diagnosis not present

## 2023-09-29 DIAGNOSIS — M62542 Muscle wasting and atrophy, not elsewhere classified, left hand: Secondary | ICD-10-CM | POA: Diagnosis not present

## 2023-09-30 ENCOUNTER — Ambulatory Visit: Payer: PPO

## 2023-09-30 VITALS — BP 148/70 | HR 85 | Temp 98.7°F | Resp 16 | Ht 63.5 in | Wt 172.2 lb

## 2023-09-30 DIAGNOSIS — I10 Essential (primary) hypertension: Secondary | ICD-10-CM

## 2023-09-30 DIAGNOSIS — E1059 Type 1 diabetes mellitus with other circulatory complications: Secondary | ICD-10-CM | POA: Insufficient documentation

## 2023-09-30 DIAGNOSIS — E1065 Type 1 diabetes mellitus with hyperglycemia: Secondary | ICD-10-CM

## 2023-09-30 NOTE — Assessment & Plan Note (Signed)
BP elevated on 2 different readings. Advised to check home Bps  If home Bps consistently stay at or above 140/90, then recommend to increase AMLODIPINE to 5 mg daily. Recommend healthy diet and lifestyle

## 2023-09-30 NOTE — Progress Notes (Signed)
Acute Office Visit  Subjective:    Patient ID: [name], female    DOB: 26-Aug-1951, 72 y.o.   MRN: [id number]  Chief Complaint  Patient presents with   Extremity Weakness    HPI: Patient is in today for a problem visit and a follow up  She has type 1 diabetes. Is in need of diabetic shoes and needs form filled.   States she had both knees replaced, broke her legs 10 years apart.  Has HTN, takes amlodipine 2.5 mg, propranolol SR 60 mg daily and Valsartan 320 mg daily. BP high today.       Past Medical History:  Diagnosis Date   Abdominal bloating 08/12/2022   Acquired hypothyroidism 12/23/2021   Acute respiratory failure with hypoxemia 04/20/2014   Asthma 08/14/2015   Ataxia 12/23/2021   Capsulitis of metatarsophalangeal (MTP) joint of left foot 01/22/2018   Cough due to ACE inhibitor 09/30/2021   Daytime somnolence 12/23/2021   Essential hypertension 04/15/2014   External hemorrhoid 11/04/2022   GERD (gastroesophageal reflux disease)    Hearing loss of left ear due to cerumen impaction 10/25/2021   Imbalance 07/11/2022   Lacunar infarction 01/10/2021   right dorsal pons   Left tibial fracture 08/03/2019   Major depressive disorder 01/21/2022   Mixed hyperlipidemia 02/17/2022   Nocturia 09/30/2021   Obstructive sleep apnea    No CPAP   Primary localized osteoarthrosis, lower leg 04/18/2014   Snoring 09/30/2021   Tremor 06/29/2022   Type 1 diabetes mellitus with hyperglycemia 09/30/2021   Urine WBC increased 07/11/2022    Past Surgical History:  Procedure Laterality Date   APPENDECTOMY     APPLICATION OF WOUND VAC Left 08/03/2019   Procedure: Application Of Wound Vac;  Surgeon: Myrene Galas, MD;  Location: [facility] OR;  Service: Orthopedics;  Laterality: Left;   broken elbow Left 08/2023   CESAREAN SECTION     ESOPHAGOGASTRODUODENOSCOPY (EGD) WITH PROPOFOL N/A 02/02/2016   Procedure: ESOPHAGOGASTRODUODENOSCOPY (EGD) WITH PROPOFOL;  Surgeon: Elnita Maxwell, MD;  Location: [facility] ENDOSCOPY;  Service: Endoscopy;  Laterality: N/A;   HAND SURGERY     JOINT REPLACEMENT Bilateral    Knee    ORIF ANKLE FRACTURE Left 08/03/2019   with wound vac applied    ORIF ANKLE FRACTURE Left 08/03/2019   Procedure: OPEN REDUCTION INTERNAL FIXATION (ORIF) ANKLE FRACTURE;  Surgeon: Myrene Galas, MD;  Location: [facility] OR;  Service: Orthopedics;  Laterality: Left;   ORIF HUMERUS FRACTURE Left 08/14/2023   Procedure: OPEN REDUCTION INTERNAL FIXATION (ORIF) DISTAL HUMERUS FRACTURE WITH OLECRANON OSTEOTOMY;  Surgeon: Myrene Galas, MD;  Location: [facility] OR;  Service: Orthopedics;  Laterality: Left;   TIBIALIS TENDON TRANSFER / REPAIR     TOTAL SHOULDER REPLACEMENT      Family History  Problem Relation Age of Onset   Cancer Mother        Colon cancer    Dementia Father        mid 71s, unspecified type   Neuropathy Father    Schizophrenia Brother     Social History   Socioeconomic History   Marital status: Single    Spouse name: Not on file   Number of children: 2   Years of education: 16   Highest education level: Bachelor's degree (e.g., BA, AB, BS)  Occupational History   Occupation: Retired    Comment: Runner, broadcasting/film/video  Tobacco Use   Smoking status: Never   Smokeless tobacco: Never  Vaping Use   Vaping status: Never  Used  Substance and Sexual Activity   Alcohol use: Not Currently   Drug use: Never   Sexual activity: Not Currently  Other Topics Concern   Not on file  Social History Narrative   Right hand   Lives alone   Drinks caffeine   An apartment building   Social Determinants of Health   Financial Resource Strain: Low Risk  (09/04/2023)   Overall Financial Resource Strain (CARDIA)    Difficulty of Paying Living Expenses: Not hard at all  Food Insecurity: No Food Insecurity (08/15/2023)   Hunger Vital Sign    Worried About Running Out of Food in the Last Year: Never true    Ran Out of Food in the Last Year: Never true  Transportation  Needs: No Transportation Needs (08/15/2023)   PRAPARE - Administrator, Civil Service (Medical): No    Lack of Transportation (Non-Medical): No  Physical Activity: Sufficiently Active (09/04/2023)   Exercise Vital Sign    Days of Exercise per Week: 7 days    Minutes of Exercise per Session: 30 min  Stress: No Stress Concern Present (09/04/2023)   [employer] of Occupational Health - Occupational Stress Questionnaire    Feeling of Stress : Not at all  Social Connections: Socially Isolated (09/04/2023)   Social Connection and Isolation Panel [NHANES]    Frequency of Communication with Friends and Family: Three times a week    Frequency of Social Gatherings with Friends and Family: Three times a week    Attends Religious Services: Never    Active Member of Clubs or Organizations: No    Attends Banker Meetings: Never    Marital Status: Divorced  Catering manager Violence: Not At Risk (08/15/2023)   Humiliation, Afraid, Rape, and Kick questionnaire    Fear of Current or Ex-Partner: No    Emotionally Abused: No    Physically Abused: No    Sexually Abused: No    Outpatient Medications Prior to Visit  Medication Sig Dispense Refill   [START ON 10/15/2023] alendronate (FOSAMAX) 70 MG tablet Take 1 tablet (70 mg total) by mouth once a week.     amLODipine (NORVASC) 2.5 MG tablet TAKE 1 TABLET BY MOUTH EVERY DAY 90 tablet 1   aspirin EC 81 MG tablet Take 81 mg by mouth daily.     calcium carbonate (OSCAL) 1500 (600 Ca) MG TABS tablet Take 600 mg of elemental calcium by mouth daily with breakfast.     docusate sodium (COLACE) 100 MG capsule Take 1 capsule (100 mg total) by mouth 2 (two) times daily for 5 days then as directed by MD 100 capsule 0   ferrous sulfate 325 (65 FE) MG tablet Take 325 mg by mouth daily with breakfast.     Glucagon (GVOKE HYPOPEN 2-PACK) 0.5 MG/0.1ML SOAJ Inject 0.5 mg into the skin daily. As needed for low sugars less than 60 if does not  respond oral sugar. 0.2 mL 2   ibuprofen (ADVIL) 600 MG tablet Take 600 mg by mouth every 6 (six) hours as needed.     insulin aspart (NOVOLOG) 100 UNIT/ML injection Inject 2-3 Units into the skin 3 (three) times daily before meals. Sliding scale Start with 3 units, for 15 carb's add and additional unit     insulin degludec (TRESIBA FLEXTOUCH) 100 UNIT/ML FlexTouch Pen Inject 19 Units into the skin at bedtime.     levothyroxine (SYNTHROID) 125 MCG tablet TAKE 1 TABLET BY MOUTH EVERY DAY BEFORE BREAKFAST  90 tablet 1   LORazepam (ATIVAN) 0.5 MG tablet Take 1 tablet (0.5 mg total) by mouth at bedtime. 30 tablet 1   omeprazole (PRILOSEC) 40 MG capsule TAKE 1 CAPSULE BY MOUTH EVERY DAY 90 capsule 3   OVER THE COUNTER MEDICATION Take 1 tablet by mouth daily. Care 1 stool softener     polyethylene glycol powder (GLYCOLAX/MIRALAX) 17 GM/SCOOP powder Take 0.5 Containers by mouth daily. 1 Scoop     propranolol ER (INDERAL LA) 60 MG 24 hr capsule TAKE 1 CAPSULE BY MOUTH EVERY DAY 90 capsule 1   psyllium (METAMUCIL) 58.6 % powder Take 1 packet by mouth daily. 1 Scoop     rosuvastatin (CRESTOR) 40 MG tablet TAKE 1 TABLET BY MOUTH EVERY DAY 90 tablet 1   valsartan (DIOVAN) 320 MG tablet TAKE 1 TABLET BY MOUTH EVERY DAY 90 tablet 0   venlafaxine XR (EFFEXOR-XR) 150 MG 24 hr capsule TAKE 1 CAPSULE BY MOUTH EVERY DAY 90 capsule 1   vitamin B-12 (CYANOCOBALAMIN) 100 MCG tablet Take 100 mcg by mouth daily.     HYDROcodone-acetaminophen (NORCO/VICODIN) 5-325 MG tablet Take 1-2 tablets by mouth every 6 (six) hours as needed for moderate pain or severe pain. 50 tablet 0   methocarbamol (ROBAXIN) 500 MG tablet Take 1 tablet (500 mg total) by mouth every 6 (six) hours as needed for muscle spasms. (Patient not taking: Reported on 09/30/2023) 50 tablet 0   No facility-administered medications prior to visit.    Allergies  Allergen Reactions   Lisinopril Cough   Zetia [Ezetimibe]     CONFUSION    Review of Systems   Constitutional: Negative.   HENT: Negative.    Eyes: Negative.   Respiratory: Negative.    Cardiovascular: Negative.   Endocrine: Negative.   Genitourinary: Negative.   Musculoskeletal:  Positive for arthralgias (left elbow pain).  Neurological: Negative.   All other systems reviewed and are negative.      Objective:        09/30/2023   10:48 AM 09/30/2023   10:16 AM 09/04/2023    3:14 PM  Vitals with BMI  Height  5' 3.5" 5' 3.5"  Weight  172 lbs 3 oz 172 lbs  BMI  30.02 29.99  Systolic 148 152 161  Diastolic 70 70 78  Pulse  85 83    Orthostatic VS for the past 72 hrs (Last 3 readings):  Patient Position BP Location Cuff Size  09/30/23 1048 Sitting Right Arm Large  09/30/23 1016 Sitting Right Arm Normal     Physical Exam Vitals and nursing note reviewed.  Constitutional:      Appearance: Normal appearance.  HENT:     Head: Normocephalic and atraumatic.  Cardiovascular:     Rate and Rhythm: Normal rate and regular rhythm.  Pulmonary:     Effort: Pulmonary effort is normal.     Breath sounds: Normal breath sounds.  Neurological:     Mental Status: She is alert.    Diabetic foot exam was performed with the following findings:   Right foot- thick callus noted on the sole of right foot Slightly decreased monofilament test on ball of right toe, otherwise normal testing both feet Decreased dorsalis pedis pulses bilaterally     Health Maintenance Due  Topic Date Due   Hepatitis C Screening  Never done   MAMMOGRAM  03/24/2023   OPHTHALMOLOGY EXAM  05/18/2023   COVID-19 Vaccine (7 - 2023-24 season) 08/25/2023    There are no preventive  care reminders to display for this patient.   Lab Results  Component Value Date   TSH 3.440 05/30/2023   Lab Results  Component Value Date   WBC 8.6 08/16/2023   HGB 9.9 (L) 08/16/2023   HCT 30.3 (L) 08/16/2023   MCV 97.4 08/16/2023   PLT 331 08/16/2023   Lab Results  Component Value Date   NA 139 08/29/2023    K 4.4 08/29/2023   CO2 25 08/29/2023   GLUCOSE 83 08/29/2023   BUN 16 08/29/2023   CREATININE 0.83 08/29/2023   BILITOT 0.3 08/29/2023   ALKPHOS 188 (H) 08/29/2023   AST 15 08/29/2023   ALT 14 08/29/2023   PROT 6.7 08/29/2023   ALBUMIN 4.2 08/29/2023   CALCIUM 9.9 08/29/2023   ANIONGAP 10 08/15/2023   EGFR 74 08/29/2023   Lab Results  Component Value Date   CHOL 208 (H) 05/30/2023   Lab Results  Component Value Date   HDL 93 05/30/2023   Lab Results  Component Value Date   LDLCALC 96 05/30/2023   Lab Results  Component Value Date   TRIG 108 05/30/2023   Lab Results  Component Value Date   CHOLHDL 2.2 05/30/2023   Lab Results  Component Value Date   HGBA1C 8.5 (H) 08/29/2023       Assessment & Plan:  Uncontrolled type 1 diabetes mellitus with hyperglycemia (HCC) Assessment & Plan: Diabetes type 1 uncontrolled with hyperglycemia. Diabetic foot exam done today and noted decreased dorsalis pedis pulses bilaterally, pre-ulcerative callus on right foot and small area of decreased sensation on monofilament testing on right.   Plan: filled paperwork for diabetic shoes. -recommend healthy low calorie diet and exercise as tolerated Follow up with PCP    Essential hypertension, benign Assessment & Plan: BP elevated on 2 different readings. Advised to check home Bps  If home Bps consistently stay at or above 140/90, then recommend to increase AMLODIPINE to 5 mg daily. Recommend healthy diet and lifestyle      No orders of the defined types were placed in this encounter.   No orders of the defined types were placed in this encounter.    Follow-up: Return in 4 weeks (on 10/28/2023) for wellness exam.  Total time spent on today's visit was greater than 30 minutes, including both face-to-face time and nonface-to-face time personally spent on review of chart (labs and imaging), discussing labs and goals, discussing further work-up, treatment options, referrals  to specialist if needed, reviewing outside records of pertinent, answering patient's questions, and coordinating care.   An After Visit Summary was printed and given to the patient.  Windell Moment, MD [facility] [phone]

## 2023-09-30 NOTE — Assessment & Plan Note (Signed)
Diabetes type 1 uncontrolled with hyperglycemia. Diabetic foot exam done today and noted decreased dorsalis pedis pulses bilaterally, pre-ulcerative callus on right foot and small area of decreased sensation on monofilament testing on right.   Plan: filled paperwork for diabetic shoes. -recommend healthy low calorie diet and exercise as tolerated Follow up with PCP

## 2023-09-30 NOTE — Patient Instructions (Signed)
Did you foot exam and filled your paper work for diabetic shoes Please monitor your Bps at home  If your home Bps come back elevated at or above 140/90 in the next couple days, PLEASE INCREASE AMLODIPINE TO 5 MG and continue to monitor Bps. Follow up for medicare wellness in 1 month

## 2023-10-02 DIAGNOSIS — M25622 Stiffness of left elbow, not elsewhere classified: Secondary | ICD-10-CM | POA: Diagnosis not present

## 2023-10-02 DIAGNOSIS — M62542 Muscle wasting and atrophy, not elsewhere classified, left hand: Secondary | ICD-10-CM | POA: Diagnosis not present

## 2023-10-02 DIAGNOSIS — M25522 Pain in left elbow: Secondary | ICD-10-CM | POA: Diagnosis not present

## 2023-10-02 DIAGNOSIS — M25422 Effusion, left elbow: Secondary | ICD-10-CM | POA: Diagnosis not present

## 2023-10-06 DIAGNOSIS — M25422 Effusion, left elbow: Secondary | ICD-10-CM | POA: Diagnosis not present

## 2023-10-06 DIAGNOSIS — M25522 Pain in left elbow: Secondary | ICD-10-CM | POA: Diagnosis not present

## 2023-10-06 DIAGNOSIS — M62542 Muscle wasting and atrophy, not elsewhere classified, left hand: Secondary | ICD-10-CM | POA: Diagnosis not present

## 2023-10-06 DIAGNOSIS — M25622 Stiffness of left elbow, not elsewhere classified: Secondary | ICD-10-CM | POA: Diagnosis not present

## 2023-10-10 DIAGNOSIS — M25622 Stiffness of left elbow, not elsewhere classified: Secondary | ICD-10-CM | POA: Diagnosis not present

## 2023-10-10 DIAGNOSIS — M62542 Muscle wasting and atrophy, not elsewhere classified, left hand: Secondary | ICD-10-CM | POA: Diagnosis not present

## 2023-10-10 DIAGNOSIS — M25522 Pain in left elbow: Secondary | ICD-10-CM | POA: Diagnosis not present

## 2023-10-10 DIAGNOSIS — M25422 Effusion, left elbow: Secondary | ICD-10-CM | POA: Diagnosis not present

## 2023-10-12 DIAGNOSIS — E1065 Type 1 diabetes mellitus with hyperglycemia: Secondary | ICD-10-CM | POA: Diagnosis not present

## 2023-10-12 DIAGNOSIS — E10319 Type 1 diabetes mellitus with unspecified diabetic retinopathy without macular edema: Secondary | ICD-10-CM | POA: Diagnosis not present

## 2023-10-13 DIAGNOSIS — M25422 Effusion, left elbow: Secondary | ICD-10-CM | POA: Diagnosis not present

## 2023-10-13 DIAGNOSIS — M25522 Pain in left elbow: Secondary | ICD-10-CM | POA: Diagnosis not present

## 2023-10-13 DIAGNOSIS — M62542 Muscle wasting and atrophy, not elsewhere classified, left hand: Secondary | ICD-10-CM | POA: Diagnosis not present

## 2023-10-13 DIAGNOSIS — M25622 Stiffness of left elbow, not elsewhere classified: Secondary | ICD-10-CM | POA: Diagnosis not present

## 2023-10-14 ENCOUNTER — Telehealth: Payer: Self-pay | Admitting: Family Medicine

## 2023-10-14 NOTE — Telephone Encounter (Signed)
PT LEFT FORMS TO BE FILLED OUT BY PCP FOR THE SNAP DUAL PROGRAM - LEFT IN PCP BOX - PT REQUEST TO  BE CALLED WHEN DONE.

## 2023-10-20 ENCOUNTER — Telehealth: Payer: Self-pay

## 2023-10-20 DIAGNOSIS — M25622 Stiffness of left elbow, not elsewhere classified: Secondary | ICD-10-CM | POA: Diagnosis not present

## 2023-10-20 DIAGNOSIS — M62542 Muscle wasting and atrophy, not elsewhere classified, left hand: Secondary | ICD-10-CM | POA: Diagnosis not present

## 2023-10-20 DIAGNOSIS — M25422 Effusion, left elbow: Secondary | ICD-10-CM | POA: Diagnosis not present

## 2023-10-20 DIAGNOSIS — M25522 Pain in left elbow: Secondary | ICD-10-CM | POA: Diagnosis not present

## 2023-10-20 NOTE — Telephone Encounter (Signed)
Patient called stating that she doubled up on the Amlodipine as previously instructed and her BP is still running >150 systolic.  Please advise.

## 2023-10-20 NOTE — Telephone Encounter (Signed)
Called patient left message to call office back

## 2023-10-22 NOTE — Telephone Encounter (Signed)
Patient called back today, she has increase her amlodipine to 10 mg and still having elevated blood pressures, made appointment for tomorrow with Dr. Faylene Kurtz

## 2023-10-23 ENCOUNTER — Ambulatory Visit (INDEPENDENT_AMBULATORY_CARE_PROVIDER_SITE_OTHER): Payer: PPO

## 2023-10-23 VITALS — BP 134/82 | HR 85 | Temp 98.0°F | Resp 16 | Ht 63.5 in | Wt 172.2 lb

## 2023-10-23 DIAGNOSIS — M25622 Stiffness of left elbow, not elsewhere classified: Secondary | ICD-10-CM | POA: Diagnosis not present

## 2023-10-23 DIAGNOSIS — E6609 Other obesity due to excess calories: Secondary | ICD-10-CM | POA: Diagnosis not present

## 2023-10-23 DIAGNOSIS — M25422 Effusion, left elbow: Secondary | ICD-10-CM | POA: Diagnosis not present

## 2023-10-23 DIAGNOSIS — E66811 Obesity, class 1: Secondary | ICD-10-CM | POA: Diagnosis not present

## 2023-10-23 DIAGNOSIS — E1065 Type 1 diabetes mellitus with hyperglycemia: Secondary | ICD-10-CM

## 2023-10-23 DIAGNOSIS — M25522 Pain in left elbow: Secondary | ICD-10-CM | POA: Diagnosis not present

## 2023-10-23 DIAGNOSIS — Z6830 Body mass index (BMI) 30.0-30.9, adult: Secondary | ICD-10-CM

## 2023-10-23 DIAGNOSIS — I1A0 Resistant hypertension: Secondary | ICD-10-CM | POA: Insufficient documentation

## 2023-10-23 DIAGNOSIS — M62542 Muscle wasting and atrophy, not elsewhere classified, left hand: Secondary | ICD-10-CM | POA: Diagnosis not present

## 2023-10-23 MED ORDER — AMLODIPINE BESYLATE 5 MG PO TABS: 5.0000 mg | ORAL_TABLET | Freq: Every day | ORAL | 1 refills | Status: DC

## 2023-10-23 NOTE — Assessment & Plan Note (Signed)
Type 1 diabetes, UNCONTROLLED WITH HYPERGLYCEMIA  with recent A1c of 8.3%. Recent prednisone use for arthritis caused blood sugar levels to rise above 300 mg/dL. Lifelong vegetarian, exercises daily. Weight gain is a concern. Discussed limitations of weight loss medications for type 1 diabetes and emphasized diet and exercise. - Refer to dietician for consultation- provided names of 2 dietitians- Heather Frost-Malek and [place]in[place] - Advise on better diabetes management - Monitor blood sugar levels closely

## 2023-10-23 NOTE — Patient Instructions (Signed)
VISIT SUMMARY:  During your follow-up visit, we discussed your concerns about high blood pressure, weight gain, and diabetes management. We reviewed your current medications and recent health changes, including the impact of a prednisone injection on your blood sugar levels. We also talked about your healthy lifestyle habits and the challenges you're facing with weight control.  YOUR PLAN:  -HYPERTENSION: Hypertension, or high blood pressure, is when the force of the blood against your artery walls is too high. Your blood pressure today was 134/82 mmHg, which is acceptable for your age, but your home readings have been higher. We adjusted your amlodipine dosage to 5 mg daily and sent a prescription to [facility]. Please follow the proper blood pressure measurement techniques we discussed and provide home readings for one week. Follow up with these readings in one week with either me or Dr. Sedalia Muta.  -TYPE 1 DIABETES MELLITUS: Type 1 diabetes is a condition where your body does not produce insulin, leading to high blood sugar levels. Your recent A1c was 8.3%, and a recent prednisone injection caused your blood sugar to rise above 300 mg/dL. We discussed the limitations of weight loss medications for type 1 diabetes and emphasized the importance of diet and exercise. You will be referred to a dietician for better diabetes management and should monitor your blood sugar levels closely.  -OBESITY: Obesity is a condition where excess body fat negatively affects your health. Despite your healthy diet and regular exercise, you have experienced significant weight gain. We discussed portion control and the potential benefits of consulting a dietician. You will be referred to a dietician for further guidance and are encouraged to continue your exercise and healthy eating habits.  -GENERAL HEALTH MAINTENANCE: You are active and exercise daily, with no recent heart attacks or kidney stones, and had a mild stroke a  couple of years ago. We encourage you to continue using the patient portal (MyChart) for communication and monitoring. Please send your weekly blood pressure readings via MyChart.  INSTRUCTIONS:  Please follow up with your home blood pressure readings in one week with either me or Dr. Sedalia Muta. Additionally, you will be referred to a dietician for consultation to help with your diabetes management and weight control.

## 2023-10-23 NOTE — Assessment & Plan Note (Signed)
Significant weight gain and difficulty fitting into clothes despite a healthy diet and regular exercise. Limited options for weight loss medications due to type 1 diabetes. Discussed portion control and potential benefits of dietician consultation. - Refer to dietician for consultation - Encourage continued exercise and healthy eating habits  General Health Maintenance Active, exercises daily. No recent heart attacks or kidney stones. Mild stroke a couple of years ago. Encouraged use of patient portal for communication and monitoring. - Encourage continued use of patient portal (MyChart) for communication and monitoring - Send weekly blood pressure readings via MyChart.

## 2023-10-23 NOTE — Progress Notes (Signed)
Subjective:  Patient ID: [name], female    DOB: 1951-04-04  Age: 72 y.o. MRN: [id number]  Chief Complaint  Patient presents with   Hypertension    HPI   The patient, with a history of hypertension, type 1 diabetes, and recent mild stroke, presents for a follow-up visit primarily concerned about her blood pressure. She reports that her blood pressure readings at home have been consistently above 150 over the past week, despite taking amlodipine 10mg , propranolol 16mg , and valsartan 320mg  daily. The patient also mentions a recent increase in her amlodipine dosage from 2.5mg  to 10mg  due to persistently high blood pressure readings.  In addition to her hypertension, the patient expresses concern about her weight, noting a significant increase that has necessitated a change in clothing size. Despite being a lifelong vegetarian and maintaining a regular exercise routine, which includes biking and chair exercises, the patient has been unable to control her weight gain. She also reports having undergone three surgeries in the past year, which she believes may have contributed to her weight issues.  Regarding her diabetes management, the patient mentions a recent increase in her blood sugar levels, which she attributes to a prednisone injection received for arthritis treatment. She reports that her blood sugar levels rose to over 300 following the injection and took about a week to stabilize. Despite these challenges, the patient maintains a small portion size in her meals and does not struggle with hunger or portion control.     09/30/2023   10:30 AM 09/04/2023    3:21 PM 05/27/2023   11:02 AM 12/16/2022   10:11 AM 12/16/2022   10:10 AM  Depression screen PHQ 2/9  Decreased Interest 0 0 0 0 0  Down, Depressed, Hopeless 0 0 0 0 0  PHQ - 2 Score 0 0 0 0 0  Altered sleeping 0 0 0 0   Tired, decreased energy 0 0 0 0   Change in appetite 0 0 0 0   Feeling bad or failure about yourself  0 0 0  0   Trouble concentrating 0 0 0 0   Moving slowly or fidgety/restless 0 0 0 0   Suicidal thoughts 0 0 0 0   PHQ-9 Score 0 0 0 0   Difficult doing work/chores Not difficult at all Not difficult at all Not difficult at all Not difficult at all         09/30/2023   10:29 AM  Fall Risk   Falls in the past year? 1  Number falls in past yr: 1  Injury with Fall? 1  Follow up Falls evaluation completed    Patient Care Team: Blane Ohara, MD as PCP - General (Family Medicine) Baldo Daub, MD as PCP - Cardiology (Cardiology) Van Clines, MD as Consulting Physician (Neurology) Tedd Sias Marlana Salvage, MD as Physician Assistant (Endocrinology) Asencion Islam, DPM as Referring Physician (Podiatry)   Review of Systems  Constitutional:  Positive for unexpected weight change.  HENT: Negative.    Respiratory: Negative.    Cardiovascular: Negative.   Gastrointestinal: Negative.   Endocrine: Negative.   Genitourinary: Negative.   Neurological: Negative.   Psychiatric/Behavioral: Negative.    All other systems reviewed and are negative.   Current Outpatient Medications on File Prior to Visit  Medication Sig Dispense Refill   alendronate (FOSAMAX) 70 MG tablet Take 1 tablet (70 mg total) by mouth once a week.     aspirin EC 81 MG tablet Take 81 mg by  mouth daily.     calcium carbonate (OSCAL) 1500 (600 Ca) MG TABS tablet Take 600 mg of elemental calcium by mouth daily with breakfast.     docusate sodium (COLACE) 100 MG capsule Take 1 capsule (100 mg total) by mouth 2 (two) times daily for 5 days then as directed by MD 100 capsule 0   ferrous sulfate 325 (65 FE) MG tablet Take 325 mg by mouth daily with breakfast.     Glucagon (GVOKE HYPOPEN 2-PACK) 0.5 MG/0.1ML SOAJ Inject 0.5 mg into the skin daily. As needed for low sugars less than 60 if does not respond oral sugar. 0.2 mL 2   ibuprofen (ADVIL) 600 MG tablet Take 600 mg by mouth every 6 (six) hours as needed.     insulin aspart (NOVOLOG)  100 UNIT/ML injection Inject 2-3 Units into the skin 3 (three) times daily before meals. Sliding scale Start with 3 units, for 15 carb's add and additional unit     insulin degludec (TRESIBA FLEXTOUCH) 100 UNIT/ML FlexTouch Pen Inject 19 Units into the skin at bedtime.     levothyroxine (SYNTHROID) 125 MCG tablet TAKE 1 TABLET BY MOUTH EVERY DAY BEFORE BREAKFAST 90 tablet 1   LORazepam (ATIVAN) 0.5 MG tablet Take 1 tablet (0.5 mg total) by mouth at bedtime. 30 tablet 1   methocarbamol (ROBAXIN) 500 MG tablet Take 1 tablet (500 mg total) by mouth every 6 (six) hours as needed for muscle spasms. 50 tablet 0   omeprazole (PRILOSEC) 40 MG capsule TAKE 1 CAPSULE BY MOUTH EVERY DAY 90 capsule 3   OVER THE COUNTER MEDICATION Take 1 tablet by mouth daily. Care 1 stool softener     polyethylene glycol powder (GLYCOLAX/MIRALAX) 17 GM/SCOOP powder Take 0.5 Containers by mouth daily. 1 Scoop     propranolol ER (INDERAL LA) 60 MG 24 hr capsule TAKE 1 CAPSULE BY MOUTH EVERY DAY 90 capsule 1   psyllium (METAMUCIL) 58.6 % powder Take 1 packet by mouth daily. 1 Scoop     rosuvastatin (CRESTOR) 40 MG tablet TAKE 1 TABLET BY MOUTH EVERY DAY 90 tablet 1   valsartan (DIOVAN) 320 MG tablet TAKE 1 TABLET BY MOUTH EVERY DAY 90 tablet 0   venlafaxine XR (EFFEXOR-XR) 150 MG 24 hr capsule TAKE 1 CAPSULE BY MOUTH EVERY DAY 90 capsule 1   vitamin B-12 (CYANOCOBALAMIN) 100 MCG tablet Take 100 mcg by mouth daily.     No current facility-administered medications on file prior to visit.   Past Medical History:  Diagnosis Date   Abdominal bloating 08/12/2022   Acquired hypothyroidism 12/23/2021   Acute respiratory failure with hypoxemia 04/20/2014   Asthma 08/14/2015   Ataxia 12/23/2021   Capsulitis of metatarsophalangeal (MTP) joint of left foot 01/22/2018   Cough due to ACE inhibitor 09/30/2021   Daytime somnolence 12/23/2021   Essential hypertension 04/15/2014   External hemorrhoid 11/04/2022   GERD (gastroesophageal  reflux disease)    Hearing loss of left ear due to cerumen impaction 10/25/2021   Imbalance 07/11/2022   Lacunar infarction 01/10/2021   right dorsal pons   Left tibial fracture 08/03/2019   Major depressive disorder 01/21/2022   Mixed hyperlipidemia 02/17/2022   Nocturia 09/30/2021   Obstructive sleep apnea    No CPAP   Primary localized osteoarthrosis, lower leg 04/18/2014   Snoring 09/30/2021   Tremor 06/29/2022   Type 1 diabetes mellitus with hyperglycemia 09/30/2021   Urine WBC increased 07/11/2022   Past Surgical History:  Procedure Laterality Date  APPENDECTOMY     APPLICATION OF WOUND VAC Left 08/03/2019   Procedure: Application Of Wound Vac;  Surgeon: Myrene Galas, MD;  Location: [facility] OR;  Service: Orthopedics;  Laterality: Left;   broken elbow Left 08/2023   CESAREAN SECTION     ESOPHAGOGASTRODUODENOSCOPY (EGD) WITH PROPOFOL N/A 02/02/2016   Procedure: ESOPHAGOGASTRODUODENOSCOPY (EGD) WITH PROPOFOL;  Surgeon: Elnita Maxwell, MD;  Location: [facility] ENDOSCOPY;  Service: Endoscopy;  Laterality: N/A;   HAND SURGERY     JOINT REPLACEMENT Bilateral    Knee    ORIF ANKLE FRACTURE Left 08/03/2019   with wound vac applied    ORIF ANKLE FRACTURE Left 08/03/2019   Procedure: OPEN REDUCTION INTERNAL FIXATION (ORIF) ANKLE FRACTURE;  Surgeon: Myrene Galas, MD;  Location: [facility] OR;  Service: Orthopedics;  Laterality: Left;   ORIF HUMERUS FRACTURE Left 08/14/2023   Procedure: OPEN REDUCTION INTERNAL FIXATION (ORIF) DISTAL HUMERUS FRACTURE WITH OLECRANON OSTEOTOMY;  Surgeon: Myrene Galas, MD;  Location: [facility] OR;  Service: Orthopedics;  Laterality: Left;   TIBIALIS TENDON TRANSFER / REPAIR     TOTAL SHOULDER REPLACEMENT      Family History  Problem Relation Age of Onset   Cancer Mother        Colon cancer    Dementia Father        mid 5s, unspecified type   Neuropathy Father    Schizophrenia Brother    Social History   Socioeconomic History   Marital status: Single     Spouse name: Not on file   Number of children: 2   Years of education: 16   Highest education level: Bachelor's degree (e.g., BA, AB, BS)  Occupational History   Occupation: Retired    Comment: Runner, broadcasting/film/video  Tobacco Use   Smoking status: Never   Smokeless tobacco: Never  Vaping Use   Vaping status: Never Used  Substance and Sexual Activity   Alcohol use: Not Currently   Drug use: Never   Sexual activity: Not Currently  Other Topics Concern   Not on file  Social History Narrative   Right hand   Lives alone   Drinks caffeine   An apartment building   Social Determinants of Health   Financial Resource Strain: Low Risk  (09/04/2023)   Overall Financial Resource Strain (CARDIA)    Difficulty of Paying Living Expenses: Not hard at all  Food Insecurity: No Food Insecurity (08/15/2023)   Hunger Vital Sign    Worried About Running Out of Food in the Last Year: Never true    Ran Out of Food in the Last Year: Never true  Transportation Needs: No Transportation Needs (08/15/2023)   PRAPARE - Administrator, Civil Service (Medical): No    Lack of Transportation (Non-Medical): No  Physical Activity: Sufficiently Active (09/04/2023)   Exercise Vital Sign    Days of Exercise per Week: 7 days    Minutes of Exercise per Session: 30 min  Stress: No Stress Concern Present (09/04/2023)   [employer] of Occupational Health - Occupational Stress Questionnaire    Feeling of Stress : Not at all  Social Connections: Socially Isolated (09/04/2023)   Social Connection and Isolation Panel [NHANES]    Frequency of Communication with Friends and Family: Three times a week    Frequency of Social Gatherings with Friends and Family: Three times a week    Attends Religious Services: Never    Active Member of Clubs or Organizations: No    Attends Banker Meetings:  Never    Marital Status: Divorced    Objective:  BP 134/82 (BP Location: Left Arm, Patient Position: Sitting,  Cuff Size: Large)   Pulse 85   Temp 98 F (36.7 C) (Temporal)   Resp 16   Ht 5' 3.5" (1.613 m)   Wt 172 lb 3.2 oz (78.1 kg)   SpO2 98%   BMI 30.03 kg/m      10/23/2023   11:06 AM 09/30/2023   10:48 AM 09/30/2023   10:16 AM  BP/Weight  Systolic BP 134 148 152  Diastolic BP 82 70 70  Wt. (Lbs) 172.2  172.2  BMI 30.03 kg/m2  30.03 kg/m2    Physical Exam Vitals and nursing note reviewed.  Constitutional:      Appearance: Normal appearance.  Cardiovascular:     Rate and Rhythm: Normal rate and regular rhythm.  Pulmonary:     Effort: Pulmonary effort is normal.     Breath sounds: Normal breath sounds.  Musculoskeletal:        General: Normal range of motion.  Neurological:     General: No focal deficit present.     Mental Status: She is alert.  Psychiatric:        Mood and Affect: Mood normal.     Diabetic Foot Exam - Simple   No data filed      Lab Results  Component Value Date   WBC 8.6 08/16/2023   HGB 9.9 (L) 08/16/2023   HCT 30.3 (L) 08/16/2023   PLT 331 08/16/2023   GLUCOSE 83 08/29/2023   CHOL 208 (H) 05/30/2023   TRIG 108 05/30/2023   HDL 93 05/30/2023   LDLCALC 96 05/30/2023   ALT 14 08/29/2023   AST 15 08/29/2023   NA 139 08/29/2023   K 4.4 08/29/2023   CL 98 08/29/2023   CREATININE 0.83 08/29/2023   BUN 16 08/29/2023   CO2 25 08/29/2023   TSH 3.440 05/30/2023   INR 0.9 08/14/2023   HGBA1C 8.5 (H) 08/29/2023      Assessment & Plan:    Resistant hypertension Assessment & Plan: Hypertension follow-up. Blood pressure today is 134/82 mmHg, acceptable for age. Reports home readings of 150 mmHg and above. Current medications: amlodipine 5 mg daily, propranolol 60 mg, valsartan 320 mg daily. Instructed on proper blood pressure measurement techniques to ensure accurate readings and avoid unnecessary medication adjustments. - Adjust amlodipine to 5 mg daily in one pill rather than taking two of 2.5 mg - Send prescription for 90 tablets with one  refill to [facility] - Instruct on proper blood pressure measurement techniques (rest for 10 minutes, empty bladder, use upper arm cuff) - Request home blood pressure readings for one week - Follow up with readings in one week with either me or Dr. Sedalia Muta  Orders: -     amLODIPine Besylate; Take 1 tablet (5 mg total) by mouth daily.  Dispense: 90 tablet; Refill: 1  Class 1 obesity due to excess calories with serious comorbidity and body mass index (BMI) of 30.0 to 30.9 in adult Assessment & Plan: Significant weight gain and difficulty fitting into clothes despite a healthy diet and regular exercise. Limited options for weight loss medications due to type 1 diabetes. Discussed portion control and potential benefits of dietician consultation. - Refer to dietician for consultation - Encourage continued exercise and healthy eating habits  General Health Maintenance Active, exercises daily. No recent heart attacks or kidney stones. Mild stroke a couple of years ago. Encouraged use  of patient portal for communication and monitoring. - Encourage continued use of patient portal (MyChart) for communication and monitoring - Send weekly blood pressure readings via MyChart.   Type 1 diabetes mellitus with hyperglycemia Assessment & Plan: Type 1 diabetes, UNCONTROLLED WITH HYPERGLYCEMIA  with recent A1c of 8.3%. Recent prednisone use for arthritis caused blood sugar levels to rise above 300 mg/dL. Lifelong vegetarian, exercises daily. Weight gain is a concern. Discussed limitations of weight loss medications for type 1 diabetes and emphasized diet and exercise. - Refer to dietician for consultation- provided names of 2 dietitians- Heather Frost-Malek and Ginelle - Advise on better diabetes management - Monitor blood sugar levels closely       Meds ordered this encounter  Medications   amLODipine (NORVASC) 5 MG tablet    Sig: Take 1 tablet (5 mg total) by mouth daily.    Dispense:  90 tablet     Refill:  1    No orders of the defined types were placed in this encounter.    Follow-up: Return in about 3 months (around 01/21/2024).  Total time spent on today's visit was 34 minutes, including both face-to-face time and nonface-to-face time personally spent on review of chart (labs and imaging), discussing labs and goals, discussing further work-up, treatment options, referrals to specialist if needed, reviewing outside records of pertinent, answering patient's questions, and coordinating care.   I,Angela Taylor,acting as a Neurosurgeon for [employer], MD.,have documented all relevant documentation on the behalf of Windell Moment, MD,as directed by  Windell Moment, MD while in the presence of Windell Moment, MD.   An After Visit Summary was printed and given to the patient.  I attest that I have reviewed this visit and agree with the plan scribed by my staff.   Windell Moment, MD [facility] [phone]     Windell Moment, MD [facility] [phone]

## 2023-10-23 NOTE — Assessment & Plan Note (Signed)
Hypertension follow-up. Blood pressure today is 134/82 mmHg, acceptable for age. Reports home readings of 150 mmHg and above. Current medications: amlodipine 5 mg daily, propranolol 60 mg, valsartan 320 mg daily. Instructed on proper blood pressure measurement techniques to ensure accurate readings and avoid unnecessary medication adjustments. - Adjust amlodipine to 5 mg daily in one pill rather than taking two of 2.5 mg - Send prescription for 90 tablets with one refill [facility] - Instruct on proper blood pressure measurement techniques (rest for 10 minutes, empty bladder, use upper arm cuff) - Request home blood pressure readings for one week - Follow up with readings in one week with either me or Dr. Sedalia Muta

## 2023-10-24 ENCOUNTER — Other Ambulatory Visit: Payer: Self-pay | Admitting: Family Medicine

## 2023-10-24 DIAGNOSIS — E1159 Type 2 diabetes mellitus with other circulatory complications: Secondary | ICD-10-CM

## 2023-11-04 DIAGNOSIS — M25422 Effusion, left elbow: Secondary | ICD-10-CM | POA: Diagnosis not present

## 2023-11-04 DIAGNOSIS — M25622 Stiffness of left elbow, not elsewhere classified: Secondary | ICD-10-CM | POA: Diagnosis not present

## 2023-11-04 DIAGNOSIS — M25522 Pain in left elbow: Secondary | ICD-10-CM | POA: Diagnosis not present

## 2023-11-04 DIAGNOSIS — M62542 Muscle wasting and atrophy, not elsewhere classified, left hand: Secondary | ICD-10-CM | POA: Diagnosis not present

## 2023-11-04 NOTE — Progress Notes (Unsigned)
Subjective:  Patient ID: [name], female    DOB: 12-Mar-1951  Age: 72 y.o. MRN: [id number]  Chief Complaint  Patient presents with   Annual Exam    HPI  Well Adult Physical: Patient here for a comprehensive physical exam.The patient reports {problems:16946} Do you take any herbs or supplements that were not prescribed by a doctor? {yes/no/not asked:9010} Are you taking calcium supplements? {yes/no:63} Are you taking aspirin daily? {yes/no:63}  Encounter for general adult medical examination without abnormal findings  Physical ("At Risk" items are starred): Patient's last physical exam was 1 year ago .  Patient wears a seat belt, has smoke detectors, has carbon monoxide detectors, practices appropriate gun safety, and wears sunscreen with extended sun exposure. Dental Care: biannual cleanings, brushes and flosses daily. Ophthalmology/Optometry: Annual visit.  Hearing loss: none Vision impairments: none Last PSA:     09/30/2023   10:30 AM 09/04/2023    3:21 PM 05/27/2023   11:02 AM 12/16/2022   10:11 AM 12/16/2022   10:10 AM  Depression screen PHQ 2/9  Decreased Interest 0 0 0 0 0  Down, Depressed, Hopeless 0 0 0 0 0  PHQ - 2 Score 0 0 0 0 0  Altered sleeping 0 0 0 0   Tired, decreased energy 0 0 0 0   Change in appetite 0 0 0 0   Feeling bad or failure about yourself  0 0 0 0   Trouble concentrating 0 0 0 0   Moving slowly or fidgety/restless 0 0 0 0   Suicidal thoughts 0 0 0 0   PHQ-9 Score 0 0 0 0   Difficult doing work/chores Not difficult at all Not difficult at all Not difficult at all Not difficult at all          06/20/2022    1:09 PM 12/16/2022   10:10 AM 05/09/2023   12:45 PM 05/27/2023   11:02 AM 09/30/2023   10:29 AM  Fall Risk  Falls in the past year? 1 1 1  0 1  Was there an injury with Fall? 0 1 1 0 1  Fall Risk Category Calculator 2 2 2  0 3  Fall Risk Category (Retired) Moderate      (RETIRED) Patient Fall Risk Level Moderate fall risk      Patient  at Risk for Falls Due to  Impaired balance/gait  No Fall Risks   Fall risk Follow up  Falls evaluation completed  Falls evaluation completed;Falls prevention discussed Falls evaluation completed              Past Medical History:  Diagnosis Date   Abdominal bloating 08/12/2022   Acquired hypothyroidism 12/23/2021   Acute respiratory failure with hypoxemia 04/20/2014   Asthma 08/14/2015   Ataxia 12/23/2021   Capsulitis of metatarsophalangeal (MTP) joint of left foot 01/22/2018   Cough due to ACE inhibitor 09/30/2021   Daytime somnolence 12/23/2021   Essential hypertension 04/15/2014   External hemorrhoid 11/04/2022   GERD (gastroesophageal reflux disease)    Hearing loss of left ear due to cerumen impaction 10/25/2021   Imbalance 07/11/2022   Lacunar infarction 01/10/2021   right dorsal pons   Left tibial fracture 08/03/2019   Major depressive disorder 01/21/2022   Mixed hyperlipidemia 02/17/2022   Nocturia 09/30/2021   Obstructive sleep apnea    No CPAP   Primary localized osteoarthrosis, lower leg 04/18/2014   Snoring 09/30/2021   Tremor 06/29/2022   Type 1 diabetes mellitus with hyperglycemia 09/30/2021  Urine WBC increased 07/11/2022   Past Surgical History:  Procedure Laterality Date   APPENDECTOMY     APPLICATION OF WOUND VAC Left 08/03/2019   Procedure: Application Of Wound Vac;  Surgeon: Myrene Galas, MD;  Location: [facility] OR;  Service: Orthopedics;  Laterality: Left;   broken elbow Left 08/2023   CESAREAN SECTION     ESOPHAGOGASTRODUODENOSCOPY (EGD) WITH PROPOFOL N/A 02/02/2016   Procedure: ESOPHAGOGASTRODUODENOSCOPY (EGD) WITH PROPOFOL;  Surgeon: Elnita Maxwell, MD;  Location: [facility] ENDOSCOPY;  Service: Endoscopy;  Laterality: N/A;   HAND SURGERY     JOINT REPLACEMENT Bilateral    Knee    ORIF ANKLE FRACTURE Left 08/03/2019   with wound vac applied    ORIF ANKLE FRACTURE Left 08/03/2019   Procedure: OPEN REDUCTION INTERNAL FIXATION (ORIF) ANKLE  FRACTURE;  Surgeon: Myrene Galas, MD;  Location: [facility] OR;  Service: Orthopedics;  Laterality: Left;   ORIF HUMERUS FRACTURE Left 08/14/2023   Procedure: OPEN REDUCTION INTERNAL FIXATION (ORIF) DISTAL HUMERUS FRACTURE WITH OLECRANON OSTEOTOMY;  Surgeon: Myrene Galas, MD;  Location: [facility] OR;  Service: Orthopedics;  Laterality: Left;   TIBIALIS TENDON TRANSFER / REPAIR     TOTAL SHOULDER REPLACEMENT      Family History  Problem Relation Age of Onset   Cancer Mother        Colon cancer    Dementia Father        mid 76s, unspecified type   Neuropathy Father    Schizophrenia Brother    Social History   Socioeconomic History   Marital status: Single    Spouse name: Not on file   Number of children: 2   Years of education: 16   Highest education level: Bachelor's degree (e.g., BA, AB, BS)  Occupational History   Occupation: Retired    Comment: Runner, broadcasting/film/video  Tobacco Use   Smoking status: Never   Smokeless tobacco: Never  Vaping Use   Vaping status: Never Used  Substance and Sexual Activity   Alcohol use: Not Currently   Drug use: Never   Sexual activity: Not Currently  Other Topics Concern   Not on file  Social History Narrative   Right hand   Lives alone   Drinks caffeine   An apartment building   Social Drivers of Health   Financial Resource Strain: Low Risk  (09/04/2023)   Overall Financial Resource Strain (CARDIA)    Difficulty of Paying Living Expenses: Not hard at all  Food Insecurity: No Food Insecurity (08/15/2023)   Hunger Vital Sign    Worried About Running Out of Food in the Last Year: Never true    Ran Out of Food in the Last Year: Never true  Transportation Needs: No Transportation Needs (08/15/2023)   PRAPARE - Administrator, Civil Service (Medical): No    Lack of Transportation (Non-Medical): No  Physical Activity: Sufficiently Active (09/04/2023)   Exercise Vital Sign    Days of Exercise per Week: 7 days    Minutes of Exercise per Session: 30 min   Stress: No Stress Concern Present (09/04/2023)   [employer] of Occupational Health - Occupational Stress Questionnaire    Feeling of Stress : Not at all  Social Connections: Socially Isolated (09/04/2023)   Social Connection and Isolation Panel [NHANES]    Frequency of Communication with Friends and Family: Three times a week    Frequency of Social Gatherings with Friends and Family: Three times a week    Attends Religious Services: Never  Active Member of Clubs or Organizations: No    Attends Banker Meetings: Never    Marital Status: Divorced   Review of Systems   Objective:  There were no vitals taken for this visit.     10/23/2023   11:06 AM 09/30/2023   10:48 AM 09/30/2023   10:16 AM  BP/Weight  Systolic BP 134 148 152  Diastolic BP 82 70 70  Wt. (Lbs) 172.2  172.2  BMI 30.03 kg/m2  30.03 kg/m2    Physical Exam  Lab Results  Component Value Date   WBC 8.6 08/16/2023   HGB 9.9 (L) 08/16/2023   HCT 30.3 (L) 08/16/2023   PLT 331 08/16/2023   GLUCOSE 83 08/29/2023   CHOL 208 (H) 05/30/2023   TRIG 108 05/30/2023   HDL 93 05/30/2023   LDLCALC 96 05/30/2023   ALT 14 08/29/2023   AST 15 08/29/2023   NA 139 08/29/2023   K 4.4 08/29/2023   CL 98 08/29/2023   CREATININE 0.83 08/29/2023   BUN 16 08/29/2023   CO2 25 08/29/2023   TSH 3.440 05/30/2023   INR 0.9 08/14/2023   HGBA1C 8.5 (H) 08/29/2023      Assessment & Plan:  Encounter for Medicare annual wellness exam     There is no height or weight on file to calculate BMI.   These are the goals we discussed:  Goals   None      This is a list of the screening recommended for you and due dates:  Health Maintenance  Topic Date Due   Hepatitis C Screening  Never done   Mammogram  03/24/2023   Eye exam for diabetics  05/18/2023   COVID-19 Vaccine (7 - 2024-25 season) 08/25/2023   Hemoglobin A1C  02/27/2024   Yearly kidney function blood test for diabetes  08/28/2024   Yearly  kidney health urinalysis for diabetes  09/01/2024   Complete foot exam   09/29/2024   Medicare Annual Wellness Visit  11/03/2024   Colon Cancer Screening  12/15/2025   DTaP/Tdap/Td vaccine (5 - Td or Tdap) 05/08/2033   Pneumonia Vaccine  Completed   Flu Shot  Completed   DEXA scan (bone density measurement)  Completed   Zoster (Shingles) Vaccine  Completed   HPV Vaccine  Aged Out     No orders of the defined types were placed in this encounter.    Follow-up: Return in 1 year (on 11/03/2024).  An After Visit Summary was printed and given to the patient.  Windell Moment, MD [facility] [phone]

## 2023-11-10 DIAGNOSIS — S42472D Displaced transcondylar fracture of left humerus, subsequent encounter for fracture with routine healing: Secondary | ICD-10-CM | POA: Diagnosis not present

## 2023-11-11 DIAGNOSIS — M25422 Effusion, left elbow: Secondary | ICD-10-CM | POA: Diagnosis not present

## 2023-11-11 DIAGNOSIS — M62542 Muscle wasting and atrophy, not elsewhere classified, left hand: Secondary | ICD-10-CM | POA: Diagnosis not present

## 2023-11-11 DIAGNOSIS — M25522 Pain in left elbow: Secondary | ICD-10-CM | POA: Diagnosis not present

## 2023-11-11 DIAGNOSIS — M25622 Stiffness of left elbow, not elsewhere classified: Secondary | ICD-10-CM | POA: Diagnosis not present

## 2023-11-11 DIAGNOSIS — E1065 Type 1 diabetes mellitus with hyperglycemia: Secondary | ICD-10-CM | POA: Diagnosis not present

## 2023-11-11 DIAGNOSIS — E10319 Type 1 diabetes mellitus with unspecified diabetic retinopathy without macular edema: Secondary | ICD-10-CM | POA: Diagnosis not present

## 2023-11-17 ENCOUNTER — Telehealth: Payer: Self-pay

## 2023-11-17 NOTE — Telephone Encounter (Signed)
[name] has printed the document and has been put into Dr. Renea Ee box for further review.  Copied from CRM [id number]. Topic: General - Other >> Nov 17, 2023  8:22 AM Gildardo Pounds wrote: Reason for CRM: Meagan, Teacher, early years/pre, faxed over request for Dr to sign notes on 11-13-2023. Request refaxed today. Callback number is [phone].

## 2023-11-18 DIAGNOSIS — M25622 Stiffness of left elbow, not elsewhere classified: Secondary | ICD-10-CM | POA: Diagnosis not present

## 2023-11-18 DIAGNOSIS — M25422 Effusion, left elbow: Secondary | ICD-10-CM | POA: Diagnosis not present

## 2023-11-18 DIAGNOSIS — M25522 Pain in left elbow: Secondary | ICD-10-CM | POA: Diagnosis not present

## 2023-11-18 DIAGNOSIS — M62542 Muscle wasting and atrophy, not elsewhere classified, left hand: Secondary | ICD-10-CM | POA: Diagnosis not present

## 2023-12-01 ENCOUNTER — Other Ambulatory Visit: Payer: Self-pay | Admitting: Family Medicine

## 2023-12-01 NOTE — Telephone Encounter (Signed)
 I see a note where is it printed. Has this paper been completed?  Copied from CRM [id number]. Topic: General - Other >> Dec 01, 2023  2:59 PM [name] wrote: Reason for CRM: [name] is calling to see if her doctor has received a document to complete from [place] in [place], [place]. Please call her back [phone]

## 2023-12-02 ENCOUNTER — Telehealth: Payer: HMO | Admitting: Neurology

## 2023-12-02 ENCOUNTER — Encounter: Payer: Self-pay | Admitting: Neurology

## 2023-12-02 VITALS — Ht 64.0 in | Wt 165.0 lb

## 2023-12-02 DIAGNOSIS — R4189 Other symptoms and signs involving cognitive functions and awareness: Secondary | ICD-10-CM | POA: Diagnosis not present

## 2023-12-02 DIAGNOSIS — Z8673 Personal history of transient ischemic attack (TIA), and cerebral infarction without residual deficits: Secondary | ICD-10-CM

## 2023-12-02 NOTE — Telephone Encounter (Signed)
 I spoke with Meagan, Teacher, early years/pre and asked her to email the documents to me. Waiting for the email now.  I tried to contact the patient back at the number listed but the phone stated that the number that I dialed is not in service.

## 2023-12-02 NOTE — Telephone Encounter (Signed)
 A copy of the documents has been placed in Dr. Marcha Solders box in [address].

## 2023-12-02 NOTE — Telephone Encounter (Signed)
 Singed and sent. Dr. Sedalia Muta

## 2023-12-02 NOTE — Progress Notes (Signed)
 Virtual Visit via Video Note The purpose of this virtual visit is to provide medical care while limiting exposure to the novel coronavirus.    Consent was obtained for video visit:  Yes.   Answered questions that patient had about telehealth interaction:  Yes.     Pt location: Home Physician Location: office Name of referring provider:  Cox, Abigail, MD I connected with [name] at patients initiation/request on 12/02/2023 at  2:30 PM EST by video enabled telemedicine application and verified that I am speaking with the correct person using two identifiers. Pt MRN:  [id number] Pt DOB:  26-Dec-1950 Video Participants:  [name]   History of Present Illness:  The patient had a virtual video visit on 12/02/2023. She was last evaluated in the neurology clinic 7 months ago for memory changes. Neuropsychological evaluation in May 2024 was essentially normal. It was felt that day-to day dysfunction could be related to chronic microvascular ischemic disease over the years, made mildly worse by her small 2022 infarct, superimposed on the normal aging process. Any ongoing psychiatric distress (she reported mild anxiety across mood-related questionnaires) or acute stressors in her day-to-day life could also exacerbate difficulties. Untreated sleep apnea can worsen day-to-day cognitive functioning. We discussed weaning off the Donepezil . Her son contacted our office in June that since stopping Donepezil , she has not been herself, with the worst temper he had ever seen, getting furious and yelling, a completely different person. He was instructed to do a gradual wean, he is not present for today's visit but Donepezil  is no longer on her medication list.  She reports feeling really good. She feels her memory is fine. She manages her own medications, finances, and drives locally without any issues. Her son Leonce lives with her. She reports mood is good. She is sleeping wonderful with her new  mattress. She denies any headaches, dizziness, vision changes, focal numbness/tingling/weakness. She fell while visiting her daughter in [place] in September and fractured her left arm, she had surgery, pain is not bad. She is able to exercise regularly and feels this helps her. She is not using her CPAP, denies any daytime drowsiness. She is on aspirin  for secondary stroke prevention.    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 She started noticing changes last school year, she was told by co-workers that she was asking the same questions. Her son has anxiety and has been concerned because she comes down to the kitchen and does not see him to her side. He thinks there is something wrong. She has occasional word-finding difficulties. She denies getting lost driving, no missed bills or medications. She denies any difficulties with ADLs. She drinks 1-2 glasses of wine every night for the past 8-10 years. She denies any head injuries. She reports they were told her father has dementia, but they are unsure if true. Her sister had strokes with some cognitive changes. She has a history of anxiety and depression, reporting rare anxiety. She denies any worsening stress, but that she had to learn a lot of new things for the new school year. She denies any headaches, dizziness, diplopia, dysarthria, dysphagia, neck/back pain, focal numbness/tingling/weakness, bowel/bladder dysfunction. No anosmia, tremors, no falls.    TSH and B12 done at PCP office were normal. She had an MRI brain with and without contrast done 04/05/16 which was reported as unremarkable, mild chronic small vessel disease  and atrophy, no acute changes. Images unavailable for review.   Update 01/22/2021: She presents for an earlier visit today after recent hospitalization on 01/10/21 for stroke. She presented to  [facility] for generalized malaise. She had a colonoscopy 3 weeks prior and while doing the prep, she began having abdominal cramping, headaches, generalized aches, which did not resolve after the procedure. She feels lightheaded when standing. She was found to have orthostatic hypotension and Lisinopril was stopped. CT head and CTA angiogram no acute changes, there was some narrowing of the V4 segment of the right vertebral artery. MRI brain showed a subacute small vessel infarct in the right dorsal pons. Echocardiogram no significant abnormalities. She was started on aspirin  and Plavix , then after 2 weeks stop Plavix . LDL was 113, rosuvastatin  dose increased to 20mg  daily. HbA1c was 9. She was evaluated by PT and OT with no deficits seen on exam. She was also noted to report daily alcohol use. She had a migraine on hospital admission treated with migraine cocktail.     Current Outpatient Medications on File Prior to Visit  Medication Sig Dispense Refill   alendronate  (FOSAMAX ) 70 MG tablet Take 1 tablet (70 mg total) by mouth once a week.     amLODipine  (NORVASC ) 5 MG tablet Take 1 tablet (5 mg total) by mouth daily. 90 tablet 1   aspirin  EC 81 MG tablet Take 81 mg by mouth daily.     calcium  carbonate (OSCAL) 1500 (600 Ca) MG TABS tablet Take 600 mg of elemental calcium  by mouth daily with breakfast.     cholecalciferol  (VITAMIN D3) 25 MCG (1000 UNIT) tablet Take 1,000 Units by mouth daily.     docusate sodium  (COLACE) 100 MG capsule Take 1 capsule (100 mg total) by mouth 2 (two) times daily for 5 days then as directed by MD 100 capsule 0   ferrous sulfate 325 (65 FE) MG tablet Take 325 mg by mouth daily with breakfast.     Glucagon  (GVOKE HYPOPEN  2-PACK) 0.5 MG/0.1ML SOAJ Inject 0.5 mg into the skin daily. As needed for low sugars less than 60 if does not respond oral sugar. 0.2 mL 2   ibuprofen (ADVIL) 600 MG tablet Take 600 mg by mouth every 6 (six) hours as needed.     insulin  aspart  (NOVOLOG ) 100 UNIT/ML injection Inject 2-3 Units into the skin 3 (three) times daily before meals. Sliding scale Start with 3 units, for 15 carb's add and additional unit     insulin  degludec (TRESIBA  FLEXTOUCH) 100 UNIT/ML FlexTouch Pen Inject 19 Units into the skin at bedtime.     levothyroxine  (SYNTHROID ) 125 MCG tablet TAKE 1 TABLET BY MOUTH EVERY DAY BEFORE BREAKFAST 90 tablet 1   LORazepam  (ATIVAN ) 0.5 MG tablet TAKE 1 TABLET BY MOUTH AT BEDTIME. 30 tablet 1   omeprazole  (PRILOSEC) 40 MG capsule TAKE 1 CAPSULE BY MOUTH EVERY DAY 90 capsule 1   OVER THE COUNTER MEDICATION Take 1 tablet by mouth daily. Care 1 stool softener     polyethylene glycol powder (GLYCOLAX /MIRALAX ) 17 GM/SCOOP powder Take 0.5 Containers by mouth daily. 1 Scoop     propranolol  ER (INDERAL  LA) 60 MG 24 hr capsule TAKE 1 CAPSULE BY MOUTH EVERY DAY 90 capsule 1   rosuvastatin  (CRESTOR ) 40 MG tablet TAKE 1 TABLET BY MOUTH EVERY DAY 90 tablet 1   valsartan  (DIOVAN ) 320 MG tablet TAKE 1 TABLET BY MOUTH EVERY DAY 90 tablet 0   venlafaxine  XR (EFFEXOR -XR) 150 MG 24  hr capsule TAKE 1 CAPSULE BY MOUTH EVERY DAY 90 capsule 1   vitamin B-12 (CYANOCOBALAMIN) 100 MCG tablet Take 100 mcg by mouth daily.     psyllium (METAMUCIL) 58.6 % powder Take 1 packet by mouth daily. 1 Scoop (Patient not taking: Reported on 12/02/2023)     No current facility-administered medications on file prior to visit.     Observations/Objective:   Vitals:   12/02/23 1403  Weight: 165 lb (74.8 kg)  Height: 5' 4 (1.626 m)   GEN:  The patient appears stated age and is in NAD.  Neurological examination: Patient is awake, alert. No aphasia or dysarthria. Intact fluency and comprehension.  Cranial nerves: Extraocular movements intact . No facial asymmetry. Motor: moves all extremities symmetrically, at least anti-gravity x 4.    Assessment and Plan:   This is a 73 yo RH woman with a history of hypertension, DM, depression, hyperthyroidism, right dorsal  pons stroke with no residual deficits, with memory changes. Neurocognitive testing in 2024 indicated no evidence of a neurodegenerative disorder at this time. Day-to-day dysfunction could be related to chronic microvascular ischemic disease over the years, made mildly worse by her small 2022 infarct, superimposed on the normal aging process. Untreated sleep apnea can also be contributing. She is off Donepezil  and reports doing well, although there is no family to corroborate history. She denies any difficulties with ADLs and reports mood is good. Encouraged to continue regular exercise. Continue daily aspirin , control of vascular risk factors for secondary stroke prevention. Follow-up as needed, call for any changes.    Follow Up Instructions:   -I discussed the assessment and treatment plan with the patient. The patient was provided an opportunity to ask questions and all were answered. The patient agreed with the plan and demonstrated an understanding of the instructions.   The patient was advised to call back or seek an in-person evaluation if the symptoms worsen or if the condition fails to improve as anticipated.     Darice CHRISTELLA Shivers, MD

## 2023-12-02 NOTE — Patient Instructions (Signed)
Good to see you doing well. Continue all your medications. Follow-up as needed, call for any changes.  

## 2023-12-04 LAB — LAB REPORT - SCANNED
A1c: 9.3
EGFR: 88

## 2023-12-04 LAB — HEMOGLOBIN A1C: Hemoglobin A1C: 9.3

## 2023-12-09 ENCOUNTER — Other Ambulatory Visit (HOSPITAL_COMMUNITY): Payer: Self-pay

## 2023-12-16 NOTE — Progress Notes (Signed)
 Subjective:  Patient ID: [name], female    DOB: 10/08/1951  Age: 73 y.o. MRN: [id number]  Chief Complaint  Patient presents with   Medical Management of Chronic Issues    HPI History of Present Illness The patient, with a history of diabetes, hypertension, hyperlipidemia, hypothyroidism, and depression, presents for routine follow-up. She recently received an Omnipod insulin pump but has not yet been trained on its use. She has previously used a MiniMed pump but discontinued it after ten years due to lack of perceived benefit. She is currently managing her diabetes with insulin injections, which she is comfortable with. She also uses a Dexcom 7 for glucose monitoring.  The patient's blood pressure is well-controlled on valsartan, amlodipine (doubled dose), and propranolol. She has been monitoring her blood pressure at home and reports that it has been consistently good.  Her hypothyroidism is managed with thyroid medication, and she is also on omeprazole for acid reflux and Crestor for hyperlipidemia. For depression and anxiety, she is on venlafaxine 150mg  once daily and lorazepam as needed, which she reports using infrequently and primarily for sleep. She denies any current feelings of depression or anxiety.  HTN: Norvasc 10 mg daily, Propranolol 60 mg daily and diovan 320 mg daily.   DMI: Novolog 2-3 units before meals, 6 units q a.m., 6 units at noon and 3 units at bedtime, tresiba 19 units daily. A1C 8.5. Checks feet daily also see's  Podiatry. Patient saw her endocrinologist. Average 163. Has omnipods but has not started to use them is waiting for training.   Hypothyroid: Synthroid 125 mcg daily, last TSH 1.4   GERD: Prilosec 40 mg daily   Cholesterol: Crestor 40 mg daily   Depression/Anxiety: Effexor 150 mg daily, Ativan 0.5 mg as needed   Osteoporosis:  Alendronate 70 mg weekly. Vitamin D is good, but orthopedic changed prescription vitamin D to otc vitamin D. .        09/30/2023   10:30 AM 09/04/2023    3:21 PM 05/27/2023   11:02 AM 12/16/2022   10:11 AM 12/16/2022   10:10 AM  Depression screen PHQ 2/9  Decreased Interest 0 0 0 0 0  Down, Depressed, Hopeless 0 0 0 0 0  PHQ - 2 Score 0 0 0 0 0  Altered sleeping 0 0 0 0   Tired, decreased energy 0 0 0 0   Change in appetite 0 0 0 0   Feeling bad or failure about yourself  0 0 0 0   Trouble concentrating 0 0 0 0   Moving slowly or fidgety/restless 0 0 0 0   Suicidal thoughts 0 0 0 0   PHQ-9 Score 0 0 0 0   Difficult doing work/chores Not difficult at all Not difficult at all Not difficult at all Not difficult at all         12/02/2023    2:03 PM  Fall Risk   Falls in the past year? 0  Number falls in past yr: 0  Injury with Fall? 0  Follow up Falls evaluation completed    Patient Care Team: Blane Ohara, MD as PCP - General (Family Medicine) Baldo Daub, MD as PCP - Cardiology (Cardiology) Van Clines, MD as Consulting Physician (Neurology) Solum, Marlana Salvage, MD as Physician Assistant (Endocrinology) Asencion Islam, DPM as Referring Physician (Podiatry) Jenene Slicker, Deberah Castle, MD as Consulting Physician (Ophthalmology)   Review of Systems  Constitutional:  Negative for chills, fatigue and fever.  HENT:  Negative for congestion, ear pain, rhinorrhea and sore throat.   Respiratory:  Negative for cough and shortness of breath.   Cardiovascular:  Negative for chest pain.  Gastrointestinal:  Negative for abdominal pain, constipation, diarrhea, nausea and vomiting.  Genitourinary:  Negative for dysuria and urgency.  Musculoskeletal:  Negative for back pain and myalgias.  Neurological:  Negative for dizziness, weakness, light-headedness and headaches.  Psychiatric/Behavioral:  Negative for dysphoric mood. The patient is not nervous/anxious.     Current Outpatient Medications on File Prior to Visit  Medication Sig Dispense Refill   alendronate (FOSAMAX) 70 MG tablet Take 1 tablet (70 mg  total) by mouth once a week.     aspirin EC 81 MG tablet Take 81 mg by mouth daily.     calcium carbonate (OSCAL) 1500 (600 Ca) MG TABS tablet Take 600 mg of elemental calcium by mouth daily with breakfast.     cholecalciferol (VITAMIN D3) 25 MCG (1000 UNIT) tablet Take 1,000 Units by mouth daily.     docusate sodium (COLACE) 100 MG capsule Take 1 capsule (100 mg total) by mouth 2 (two) times daily for 5 days then as directed by MD 100 capsule 0   ferrous sulfate 325 (65 FE) MG tablet Take 325 mg by mouth daily with breakfast.     Glucagon (GVOKE HYPOPEN 2-PACK) 0.5 MG/0.1ML SOAJ Inject 0.5 mg into the skin daily. As needed for low sugars less than 60 if does not respond oral sugar. 0.2 mL 2   ibuprofen (ADVIL) 600 MG tablet Take 600 mg by mouth every 6 (six) hours as needed.     insulin aspart (NOVOLOG) 100 UNIT/ML injection Inject 2-3 Units into the skin 3 (three) times daily before meals. Sliding scale Start with 3 units, for 15 carb's add and additional unit     insulin degludec (TRESIBA FLEXTOUCH) 100 UNIT/ML FlexTouch Pen Inject 19 Units into the skin at bedtime.     levothyroxine (SYNTHROID) 125 MCG tablet TAKE 1 TABLET BY MOUTH EVERY DAY BEFORE BREAKFAST 90 tablet 1   LORazepam (ATIVAN) 0.5 MG tablet TAKE 1 TABLET BY MOUTH AT BEDTIME. 30 tablet 1   omeprazole (PRILOSEC) 40 MG capsule TAKE 1 CAPSULE BY MOUTH EVERY DAY 90 capsule 1   OVER THE COUNTER MEDICATION Take 1 tablet by mouth daily. Care 1 stool softener     polyethylene glycol powder (GLYCOLAX/MIRALAX) 17 GM/SCOOP powder Take 0.5 Containers by mouth daily. 1 Scoop     propranolol ER (INDERAL LA) 60 MG 24 hr capsule TAKE 1 CAPSULE BY MOUTH EVERY DAY 90 capsule 1   psyllium (METAMUCIL) 58.6 % powder Take 1 packet by mouth daily. 1 Scoop (Patient not taking: Reported on 12/02/2023)     rosuvastatin (CRESTOR) 40 MG tablet TAKE 1 TABLET BY MOUTH EVERY DAY 90 tablet 1   valsartan (DIOVAN) 320 MG tablet TAKE 1 TABLET BY MOUTH EVERY DAY 90  tablet 0   venlafaxine XR (EFFEXOR-XR) 150 MG 24 hr capsule TAKE 1 CAPSULE BY MOUTH EVERY DAY 90 capsule 1   vitamin B-12 (CYANOCOBALAMIN) 100 MCG tablet Take 100 mcg by mouth daily.     No current facility-administered medications on file prior to visit.   Past Medical History:  Diagnosis Date   Abdominal bloating 08/12/2022   Acquired hypothyroidism 12/23/2021   Acute respiratory failure with hypoxemia 04/20/2014   Asthma 08/14/2015   Ataxia 12/23/2021   Capsulitis of metatarsophalangeal (MTP) joint of left foot 01/22/2018   Cough due to ACE inhibitor  09/30/2021   Daytime somnolence 12/23/2021   Essential hypertension 04/15/2014   External hemorrhoid 11/04/2022   GERD (gastroesophageal reflux disease)    Hearing loss of left ear due to cerumen impaction 10/25/2021   Imbalance 07/11/2022   Lacunar infarction 01/10/2021   right dorsal pons   Left tibial fracture 08/03/2019   Major depressive disorder 01/21/2022   Mixed hyperlipidemia 02/17/2022   Nocturia 09/30/2021   Obstructive sleep apnea    No CPAP   Primary localized osteoarthrosis, lower leg 04/18/2014   Snoring 09/30/2021   Tremor 06/29/2022   Type 1 diabetes mellitus with hyperglycemia 09/30/2021   Urine WBC increased 07/11/2022   Past Surgical History:  Procedure Laterality Date   APPENDECTOMY     APPLICATION OF WOUND VAC Left 08/03/2019   Procedure: Application Of Wound Vac;  Surgeon: Myrene Galas, MD;  Location: [facility] OR;  Service: Orthopedics;  Laterality: Left;   broken elbow Left 08/2023   CESAREAN SECTION     ESOPHAGOGASTRODUODENOSCOPY (EGD) WITH PROPOFOL N/A 02/02/2016   Procedure: ESOPHAGOGASTRODUODENOSCOPY (EGD) WITH PROPOFOL;  Surgeon: Elnita Maxwell, MD;  Location: [facility] ENDOSCOPY;  Service: Endoscopy;  Laterality: N/A;   HAND SURGERY     JOINT REPLACEMENT Bilateral    Knee    ORIF ANKLE FRACTURE Left 08/03/2019   with wound vac applied    ORIF ANKLE FRACTURE Left 08/03/2019   Procedure: OPEN  REDUCTION INTERNAL FIXATION (ORIF) ANKLE FRACTURE;  Surgeon: Myrene Galas, MD;  Location: [facility] OR;  Service: Orthopedics;  Laterality: Left;   ORIF HUMERUS FRACTURE Left 08/14/2023   Procedure: OPEN REDUCTION INTERNAL FIXATION (ORIF) DISTAL HUMERUS FRACTURE WITH OLECRANON OSTEOTOMY;  Surgeon: Myrene Galas, MD;  Location: [facility] OR;  Service: Orthopedics;  Laterality: Left;   TIBIALIS TENDON TRANSFER / REPAIR     TOTAL SHOULDER REPLACEMENT      Family History  Problem Relation Age of Onset   Cancer Mother        Colon cancer    Dementia Father        mid 56s, unspecified type   Neuropathy Father    Schizophrenia Brother    Social History   Socioeconomic History   Marital status: Single    Spouse name: Not on file   Number of children: 2   Years of education: 16   Highest education level: Bachelor's degree (e.g., BA, AB, BS)  Occupational History   Occupation: Retired    Comment: Runner, broadcasting/film/video  Tobacco Use   Smoking status: Never   Smokeless tobacco: Never  Vaping Use   Vaping status: Never Used  Substance and Sexual Activity   Alcohol use: Not Currently   Drug use: Never   Sexual activity: Not Currently  Other Topics Concern   Not on file  Social History Narrative   Right hand   Lives alone   Drinks caffeine   An apartment building   Social Drivers of Health   Financial Resource Strain: Low Risk  (09/04/2023)   Overall Financial Resource Strain (CARDIA)    Difficulty of Paying Living Expenses: Not hard at all  Food Insecurity: No Food Insecurity (08/15/2023)   Hunger Vital Sign    Worried About Running Out of Food in the Last Year: Never true    Ran Out of Food in the Last Year: Never true  Transportation Needs: No Transportation Needs (08/15/2023)   PRAPARE - Administrator, Civil Service (Medical): No    Lack of Transportation (Non-Medical): No  Physical Activity: Sufficiently Active (  09/04/2023)   Exercise Vital Sign    Days of Exercise per Week: 7 days     Minutes of Exercise per Session: 30 min  Stress: No Stress Concern Present (09/04/2023)   [employer] of Occupational Health - Occupational Stress Questionnaire    Feeling of Stress : Not at all  Social Connections: Socially Isolated (09/04/2023)   Social Connection and Isolation Panel [NHANES]    Frequency of Communication with Friends and Family: Three times a week    Frequency of Social Gatherings with Friends and Family: Three times a week    Attends Religious Services: Never    Active Member of Clubs or Organizations: No    Attends Banker Meetings: Never    Marital Status: Divorced    Objective:  BP 124/62   Pulse 92   Temp 97.8 F (36.6 C)   Ht 5\' 4"  (1.626 m)   Wt 160 lb (72.6 kg)   SpO2 94%   BMI 27.46 kg/m      12/17/2023   10:56 AM 12/02/2023    2:03 PM 10/23/2023   11:06 AM  BP/Weight  Systolic BP 124  161  Diastolic BP 62  82  Wt. (Lbs) 160 165 172.2  BMI 27.46 kg/m2 28.32 kg/m2 30.03 kg/m2    Physical Exam Vitals reviewed.  Constitutional:      Appearance: Normal appearance. She is normal weight.  Neck:     Vascular: No carotid bruit.  Cardiovascular:     Rate and Rhythm: Normal rate and regular rhythm.     Heart sounds: Normal heart sounds.  Pulmonary:     Effort: Pulmonary effort is normal. No respiratory distress.     Breath sounds: Normal breath sounds.  Abdominal:     General: Abdomen is flat. Bowel sounds are normal.     Palpations: Abdomen is soft.     Tenderness: There is no abdominal tenderness.  Neurological:     Mental Status: She is alert and oriented to person, place, and time.  Psychiatric:        Mood and Affect: Mood normal.        Behavior: Behavior normal.     Diabetic Foot Exam - Simple   Simple Foot Form  12/17/2023  8:34 PM  Visual Inspection No deformities, no ulcerations, no other skin breakdown bilaterally: Yes Sensation Testing Intact to touch and monofilament testing bilaterally: Yes Pulse  Check Posterior Tibialis and Dorsalis pulse intact bilaterally: Yes Comments      Lab Results  Component Value Date   WBC 6.1 12/17/2023   HGB 13.7 12/17/2023   HCT 44.0 12/17/2023   PLT 326 12/17/2023   GLUCOSE 83 08/29/2023   CHOL 180 12/17/2023   TRIG 95 12/17/2023   HDL 87 12/17/2023   LDLCALC 76 12/17/2023   ALT 14 08/29/2023   AST 15 08/29/2023   NA 139 08/29/2023   K 4.4 08/29/2023   CL 98 08/29/2023   CREATININE 0.83 08/29/2023   BUN 16 08/29/2023   CO2 25 08/29/2023   TSH 3.440 05/30/2023   INR 0.9 08/14/2023   HGBA1C 9.3 12/04/2023      Assessment & Plan:    Uncontrolled type 1 diabetes mellitus with hyperglycemia (HCC) Assessment & Plan: Patient is transitioning to an Omnipod insulin pump but has not yet started using it due to pending training. Patient is also using a Dexcom 7 for glucose monitoring. Recent A1c was reported as 9.3, which is inconsistent with patient's reported glucose  readings and previous A1c levels. -Continue current insulin regimen until Omnipod training is completed. -Verify recent A1c result due to inconsistency with patient's reported glucose readings.    Essential hypertension, benign Assessment & Plan: Well controlled with Valsartan, Amlodipine (10mg  daily), and Propranolol. Recent blood pressure reading was 124/62. -Continue current antihypertensive regimen.  Orders: -     CBC with Differential/Platelet  Acquired hypothyroidism Assessment & Plan: Continue synthroid 125 mcg daily.   Obtain recent labs from outside provider.     Gastroesophageal reflux disease without esophagitis Assessment & Plan: Well-controlled.   Continue prilosec 40 mg daily.   Continue healthy diet and exercise.    Mixed hyperlipidemia Assessment & Plan: Patient is on Crestor. Recent cholesterol levels were not checked. -Order lipid panel.  Orders: -     Lipid panel  Encounter for screening mammogram for malignant neoplasm of breast -      Digital Screening Mammogram, Left and Right; Future  Mild recurrent major depression (HCC) Assessment & Plan: Managed with Venlafaxine 150mg  daily and Lorazepam as needed. Patient reports no current symptoms of depression or anxiety. -Continue current psychiatric medications.      No orders of the defined types were placed in this encounter.   Orders Placed This Encounter  Procedures   MM DIGITAL SCREENING BILATERAL   CBC with Differential/Platelet   Lipid panel     Follow-up: Return in about 3 months (around 03/16/2024) for chronic follow up.   I,Marla I Leal-Borjas,acting as a scribe for Blane Ohara, MD.,have documented all relevant documentation on the behalf of Blane Ohara, MD,as directed by  Blane Ohara, MD while in the presence of Blane Ohara, MD.   An After Visit Summary was printed and given to the patient.  I attest that I have reviewed this visit and agree with the plan scribed by my staff.   Blane Ohara, MD [facility] [phone]

## 2023-12-17 ENCOUNTER — Ambulatory Visit (INDEPENDENT_AMBULATORY_CARE_PROVIDER_SITE_OTHER): Payer: PPO | Admitting: Family Medicine

## 2023-12-17 ENCOUNTER — Encounter: Payer: Self-pay | Admitting: Family Medicine

## 2023-12-17 VITALS — BP 124/62 | HR 92 | Temp 97.8°F | Ht 64.0 in | Wt 160.0 lb

## 2023-12-17 DIAGNOSIS — K219 Gastro-esophageal reflux disease without esophagitis: Secondary | ICD-10-CM

## 2023-12-17 DIAGNOSIS — I10 Essential (primary) hypertension: Secondary | ICD-10-CM | POA: Diagnosis not present

## 2023-12-17 DIAGNOSIS — E039 Hypothyroidism, unspecified: Secondary | ICD-10-CM

## 2023-12-17 DIAGNOSIS — E1065 Type 1 diabetes mellitus with hyperglycemia: Secondary | ICD-10-CM | POA: Diagnosis not present

## 2023-12-17 DIAGNOSIS — Z1231 Encounter for screening mammogram for malignant neoplasm of breast: Secondary | ICD-10-CM

## 2023-12-17 DIAGNOSIS — E782 Mixed hyperlipidemia: Secondary | ICD-10-CM

## 2023-12-17 DIAGNOSIS — F330 Major depressive disorder, recurrent, mild: Secondary | ICD-10-CM

## 2023-12-18 ENCOUNTER — Encounter: Payer: Self-pay | Admitting: Family Medicine

## 2023-12-18 LAB — LIPID PANEL
Chol/HDL Ratio: 2.1 {ratio} (ref 0.0–4.4)
Cholesterol, Total: 180 mg/dL (ref 100–199)
HDL: 87 mg/dL (ref >39)
LDL Chol Calc (NIH): 76 mg/dL (ref 0–99)
Triglycerides: 95 mg/dL (ref 0–149)
VLDL Cholesterol Cal: 17 mg/dL (ref 5–40)

## 2023-12-18 LAB — CBC WITH DIFFERENTIAL/PLATELET
Basophils Absolute: 0.1 10*3/uL (ref 0.0–0.2)
Basos: 2 %
EOS (ABSOLUTE): 0.4 10*3/uL (ref 0.0–0.4)
Eos: 7 %
Hematocrit: 44 % (ref 34.0–46.6)
Hemoglobin: 13.7 g/dL (ref 11.1–15.9)
Immature Grans (Abs): 0 10*3/uL (ref 0.0–0.1)
Immature Granulocytes: 0 %
Lymphocytes Absolute: 1.5 10*3/uL (ref 0.7–3.1)
Lymphs: 26 %
MCH: 29.5 pg (ref 26.6–33.0)
MCHC: 31.1 g/dL — ABNORMAL LOW (ref 31.5–35.7)
MCV: 95 fL (ref 79–97)
Monocytes Absolute: 0.6 10*3/uL (ref 0.1–0.9)
Monocytes: 10 %
Neutrophils Absolute: 3.4 10*3/uL (ref 1.4–7.0)
Neutrophils: 55 %
Platelets: 326 10*3/uL (ref 150–450)
RBC: 4.64 x10E6/uL (ref 3.77–5.28)
RDW: 11.8 % (ref 11.7–15.4)
WBC: 6.1 10*3/uL (ref 3.4–10.8)

## 2023-12-21 ENCOUNTER — Other Ambulatory Visit: Payer: Self-pay | Admitting: Family Medicine

## 2023-12-21 DIAGNOSIS — I1A0 Resistant hypertension: Secondary | ICD-10-CM

## 2023-12-21 MED ORDER — AMLODIPINE BESYLATE 10 MG PO TABS: 10.0000 mg | ORAL_TABLET | Freq: Every day | ORAL | 0 refills | Status: DC

## 2023-12-21 NOTE — Assessment & Plan Note (Signed)
Patient is on Crestor. Recent cholesterol levels were not checked. -Order lipid panel.

## 2023-12-21 NOTE — Assessment & Plan Note (Signed)
Patient is transitioning to an Omnipod insulin pump but has not yet started using it due to pending training. Patient is also using a Dexcom 7 for glucose monitoring. Recent A1c was reported as 9.3, which is inconsistent with patient's reported glucose readings and previous A1c levels. -Continue current insulin regimen until Omnipod training is completed. -Verify recent A1c result due to inconsistency with patient's reported glucose readings.

## 2023-12-21 NOTE — Assessment & Plan Note (Signed)
Well-controlled.   Continue prilosec 40 mg daily.   Continue healthy diet and exercise.

## 2023-12-21 NOTE — Assessment & Plan Note (Signed)
Well controlled with Valsartan, Amlodipine (10mg  daily), and Propranolol. Recent blood pressure reading was 124/62. -Continue current antihypertensive regimen.

## 2023-12-21 NOTE — Assessment & Plan Note (Signed)
Continue synthroid 125 mcg daily.   Obtain recent labs from outside provider.

## 2023-12-21 NOTE — Assessment & Plan Note (Signed)
Managed with Venlafaxine 150mg  daily and Lorazepam as needed. Patient reports no current symptoms of depression or anxiety. -Continue current psychiatric medications.

## 2024-01-18 ENCOUNTER — Other Ambulatory Visit: Payer: Self-pay | Admitting: Family Medicine

## 2024-01-22 ENCOUNTER — Other Ambulatory Visit: Payer: Self-pay | Admitting: Physician Assistant

## 2024-01-22 ENCOUNTER — Other Ambulatory Visit: Payer: Self-pay | Admitting: Family Medicine

## 2024-01-22 DIAGNOSIS — R251 Tremor, unspecified: Secondary | ICD-10-CM

## 2024-01-26 ENCOUNTER — Other Ambulatory Visit: Payer: Self-pay

## 2024-01-26 DIAGNOSIS — I152 Hypertension secondary to endocrine disorders: Secondary | ICD-10-CM

## 2024-02-15 ENCOUNTER — Other Ambulatory Visit: Payer: Self-pay | Admitting: Family Medicine

## 2024-03-17 ENCOUNTER — Other Ambulatory Visit: Payer: Self-pay | Admitting: Family Medicine

## 2024-03-17 DIAGNOSIS — I1A0 Resistant hypertension: Secondary | ICD-10-CM

## 2024-03-22 ENCOUNTER — Encounter: Payer: Self-pay | Admitting: Family Medicine

## 2024-03-22 LAB — HM MAMMOGRAPHY

## 2024-03-25 ENCOUNTER — Other Ambulatory Visit: Payer: Self-pay

## 2024-03-29 ENCOUNTER — Encounter (HOSPITAL_COMMUNITY): Payer: Self-pay

## 2024-04-04 NOTE — Progress Notes (Unsigned)
 Subjective:  Patient ID: [name], female    DOB: December 19, 1950  Age: 73 y.o. MRN: [id number]  Chief Complaint  Patient presents with   Medical Management of Chronic Issues    HPI: HTN: Norvasc  10 mg daily, Propranolol  60 mg daily and diovan  320 mg daily.   DMI: Started on omnipods and dexcom G6.  Checks feet daily also see's Podiatry. Patient saw her endocrinologist. Dr. Lorelei Rogers.  Has eye appt next week Dr. Peeson 03/16/2024 -- Hb A1c 8.2%, glc 117, AST 18, ALT 18, eGFR 83, TSH 3.32, 25-OH D 52   Hypothyroid: Synthroid  125 mcg daily, last TSH 3.32  GERD: Prilosec 40 mg daily   Cholesterol: Crestor  40 mg daily   Depression/Anxiety: Effexor  150 mg daily, Ativan  0.5 mg as needed   Osteoporosis:  Alendronate  70 mg weekly.  FELL last week while getting up during the night, hit her left hip.     04/05/2024   10:34 AM 09/30/2023   10:30 AM 09/04/2023    3:21 PM 05/27/2023   11:02 AM 12/16/2022   10:11 AM  Depression screen PHQ 2/9  Decreased Interest 0 0 0 0 0  Down, Depressed, Hopeless 0 0 0 0 0  PHQ - 2 Score 0 0 0 0 0  Altered sleeping 0 0 0 0 0  Tired, decreased energy 0 0 0 0 0  Change in appetite 0 0 0 0 0  Feeling bad or failure about yourself  0 0 0 0 0  Trouble concentrating 0 0 0 0 0  Moving slowly or fidgety/restless 0 0 0 0 0  Suicidal thoughts 0 0 0 0 0  PHQ-9 Score 0 0 0 0 0  Difficult doing work/chores  Not difficult at all Not difficult at all Not difficult at all Not difficult at all        04/05/2024   10:35 AM  Fall Risk   Falls in the past year? 1  Number falls in past yr: 0  Injury with Fall? 0  Risk for fall due to : No Fall Risks  Follow up Falls evaluation completed    Patient Care Team: Mercy Stall, MD as PCP - General (Family Medicine) Hassan Links, MD as PCP - Cardiology (Cardiology) Jhonny Moss, MD as Consulting Physician (Neurology) Solum, Nicolas Barren, MD as Physician Assistant (Endocrinology) Lizzie Riis, DPM as Referring  Physician (Podiatry) Chancey Combe, Linette Rias, MD as Consulting Physician (Ophthalmology)   Review of Systems  Constitutional:  Negative for chills, fatigue and fever.  HENT:  Negative for congestion, ear pain, rhinorrhea and sore throat.   Respiratory:  Negative for cough and shortness of breath.   Cardiovascular:  Negative for chest pain.  Gastrointestinal:  Negative for abdominal pain, constipation, diarrhea, nausea and vomiting.  Genitourinary:  Negative for dysuria and urgency.  Musculoskeletal:  Negative for back pain and myalgias.  Neurological:  Negative for dizziness, weakness, light-headedness and headaches.  Psychiatric/Behavioral:  Negative for dysphoric mood. The patient is not nervous/anxious.     Current Outpatient Medications on File Prior to Visit  Medication Sig Dispense Refill   alendronate  (FOSAMAX ) 70 MG tablet Take 1 tablet (70 mg total) by mouth once a week.     amLODipine  (NORVASC ) 10 MG tablet TAKE 1 TABLET BY MOUTH EVERY DAY 90 tablet 0   aspirin  EC 81 MG tablet Take 81 mg by mouth daily.     calcium  carbonate (OSCAL) 1500 (600 Ca) MG TABS tablet Take 600 mg of elemental  calcium  by mouth daily with breakfast.     cholecalciferol  (VITAMIN D3) 25 MCG (1000 UNIT) tablet Take 1,000 Units by mouth daily.     docusate sodium  (COLACE) 100 MG capsule Take 1 capsule (100 mg total) by mouth 2 (two) times daily for 5 days then as directed by MD 100 capsule 0   ferrous sulfate 325 (65 FE) MG tablet Take 325 mg by mouth daily with breakfast.     Glucagon  (GVOKE HYPOPEN  2-PACK) 0.5 MG/0.1ML SOAJ Inject 0.5 mg into the skin daily. As needed for low sugars less than 60 if does not respond oral sugar. 0.2 mL 2   ibuprofen (ADVIL) 600 MG tablet Take 600 mg by mouth every 6 (six) hours as needed.     insulin  aspart (NOVOLOG ) 100 UNIT/ML injection Inject 2-3 Units into the skin 3 (three) times daily before meals. Sliding scale Start with 3 units, for 15 carb's add and additional unit      insulin  degludec (TRESIBA  FLEXTOUCH) 100 UNIT/ML FlexTouch Pen Inject 19 Units into the skin at bedtime.     levothyroxine  (SYNTHROID ) 125 MCG tablet TAKE 1 TABLET BY MOUTH EVERY DAY BEFORE BREAKFAST 90 tablet 1   LORazepam  (ATIVAN ) 0.5 MG tablet TAKE 1 TABLET BY MOUTH EVERYDAY AT BEDTIME 30 tablet 2   omeprazole  (PRILOSEC) 40 MG capsule TAKE 1 CAPSULE BY MOUTH EVERY DAY 90 capsule 1   OVER THE COUNTER MEDICATION Take 1 tablet by mouth daily. Care 1 stool softener     polyethylene glycol powder (GLYCOLAX /MIRALAX ) 17 GM/SCOOP powder Take 0.5 Containers by mouth daily. 1 Scoop     propranolol  ER (INDERAL  LA) 60 MG 24 hr capsule TAKE 1 CAPSULE BY MOUTH EVERY DAY 90 capsule 1   psyllium (METAMUCIL) 58.6 % powder Take 1 packet by mouth daily. 1 Scoop (Patient not taking: Reported on 12/02/2023)     rosuvastatin  (CRESTOR ) 40 MG tablet TAKE 1 TABLET BY MOUTH EVERY DAY 90 tablet 1   valsartan  (DIOVAN ) 320 MG tablet TAKE 1 TABLET BY MOUTH EVERY DAY 90 tablet 0   venlafaxine  XR (EFFEXOR -XR) 150 MG 24 hr capsule TAKE 1 CAPSULE BY MOUTH EVERY DAY 90 capsule 1   vitamin B-12 (CYANOCOBALAMIN) 100 MCG tablet Take 100 mcg by mouth daily.     No current facility-administered medications on file prior to visit.   Past Medical History:  Diagnosis Date   Abdominal bloating 08/12/2022   Acquired hypothyroidism 12/23/2021   Acute respiratory failure with hypoxemia 04/20/2014   Asthma 08/14/2015   Ataxia 12/23/2021   Capsulitis of metatarsophalangeal (MTP) joint of left foot 01/22/2018   Cough due to ACE inhibitor 09/30/2021   Daytime somnolence 12/23/2021   Essential hypertension 04/15/2014   External hemorrhoid 11/04/2022   GERD (gastroesophageal reflux disease)    Hearing loss of left ear due to cerumen impaction 10/25/2021   Imbalance 07/11/2022   Lacunar infarction 01/10/2021   right dorsal pons   Left tibial fracture 08/03/2019   Major depressive disorder 01/21/2022   Mixed hyperlipidemia 02/17/2022    Nocturia 09/30/2021   Obstructive sleep apnea    No CPAP   Primary localized osteoarthrosis, lower leg 04/18/2014   Snoring 09/30/2021   Tremor 06/29/2022   Type 1 diabetes mellitus with hyperglycemia 09/30/2021   Urine WBC increased 07/11/2022   Past Surgical History:  Procedure Laterality Date   APPENDECTOMY     APPLICATION OF WOUND VAC Left 08/03/2019   Procedure: Application Of Wound Vac;  Surgeon: Hardy Lia, MD;  Location: [facility] OR;  Service: Orthopedics;  Laterality: Left;   broken elbow Left 08/2023   CESAREAN SECTION     ESOPHAGOGASTRODUODENOSCOPY (EGD) WITH PROPOFOL  N/A 02/02/2016   Procedure: ESOPHAGOGASTRODUODENOSCOPY (EGD) WITH PROPOFOL ;  Surgeon: Luella Sager, MD;  [facility] ENDOSCOPY;  Service: Endoscopy;  Laterality: N/A;   HAND SURGERY     JOINT REPLACEMENT Bilateral    Knee    ORIF ANKLE FRACTURE Left 08/03/2019   with wound vac applied    ORIF ANKLE FRACTURE Left 08/03/2019   Procedure: OPEN REDUCTION INTERNAL FIXATION (ORIF) ANKLE FRACTURE;  Surgeon: Hardy Lia, MD;  Location: [facility] OR;  Service: Orthopedics;  Laterality: Left;   ORIF HUMERUS FRACTURE Left 08/14/2023   Procedure: OPEN REDUCTION INTERNAL FIXATION (ORIF) DISTAL HUMERUS FRACTURE WITH OLECRANON OSTEOTOMY;  Surgeon: Hardy Lia, MD;  Location: [facility] OR;  Service: Orthopedics;  Laterality: Left;   TIBIALIS TENDON TRANSFER / REPAIR     TOTAL SHOULDER REPLACEMENT      Family History  Problem Relation Age of Onset   Cancer Mother        Colon cancer    Dementia Father        mid 39s, unspecified type   Neuropathy Father    Schizophrenia Brother    Social History   Socioeconomic History   Marital status: Single    Spouse name: Not on file   Number of children: 2   Years of education: 16   Highest education level: Bachelor's degree (e.g., BA, AB, BS)  Occupational History   Occupation: Retired    Comment: Runner, broadcasting/film/video  Tobacco Use   Smoking status: Never   Smokeless tobacco:  Never  Vaping Use   Vaping status: Never Used  Substance and Sexual Activity   Alcohol use: Not Currently   Drug use: Never   Sexual activity: Not Currently  Other Topics Concern   Not on file  Social History Narrative   Right hand   Lives alone   Drinks caffeine   An apartment building   Social Drivers of Health   Financial Resource Strain: Low Risk  (09/04/2023)   Overall Financial Resource Strain (CARDIA)    Difficulty of Paying Living Expenses: Not hard at all  Food Insecurity: No Food Insecurity (08/15/2023)   Hunger Vital Sign    Worried About Running Out of Food in the Last Year: Never true    Ran Out of Food in the Last Year: Never true  Transportation Needs: No Transportation Needs (08/15/2023)   PRAPARE - Administrator, Civil Service (Medical): No    Lack of Transportation (Non-Medical): No  Physical Activity: Sufficiently Active (09/04/2023)   Exercise Vital Sign    Days of Exercise per Week: 7 days    Minutes of Exercise per Session: 30 min  Stress: No Stress Concern Present (09/04/2023)   [employer] of Occupational Health - Occupational Stress Questionnaire    Feeling of Stress : Not at all  Social Connections: Socially Isolated (09/04/2023)   Social Connection and Isolation Panel [NHANES]    Frequency of Communication with Friends and Family: Three times a week    Frequency of Social Gatherings with Friends and Family: Three times a week    Attends Religious Services: Never    Active Member of Clubs or Organizations: No    Attends Banker Meetings: Never    Marital Status: Divorced    Objective:  BP 136/72   Pulse 88   Temp 98.1 F (36.7  C)   Ht 5\' 4"  (1.626 m)   Wt 170 lb (77.1 kg)   SpO2 98%   BMI 29.18 kg/m      04/05/2024   10:31 AM 12/17/2023   10:56 AM 12/02/2023    2:03 PM  BP/Weight  Systolic BP 136 124   Diastolic BP 72 62   Wt. (Lbs) 170 160 165  BMI 29.18 kg/m2 27.46 kg/m2 28.32 kg/m2    Physical  Exam  Diabetic Foot Exam - Simple   No data filed      Lab Results  Component Value Date   WBC 6.1 12/17/2023   HGB 13.7 12/17/2023   HCT 44.0 12/17/2023   PLT 326 12/17/2023   GLUCOSE 83 08/29/2023   CHOL 188 04/05/2024   TRIG 85 04/05/2024   HDL 84 04/05/2024   LDLCALC 89 04/05/2024   ALT 14 08/29/2023   AST 15 08/29/2023   NA 139 08/29/2023   K 4.4 08/29/2023   CL 98 08/29/2023   CREATININE 0.83 08/29/2023   BUN 16 08/29/2023   CO2 25 08/29/2023   TSH 3.440 05/30/2023   INR 0.9 08/14/2023   HGBA1C 9.3 12/04/2023      Assessment & Plan:  Essential hypertension, benign Assessment & Plan: Well controlled with Valsartan , Amlodipine , and Propranolol .  -Continue current antihypertensive regimen.   Uncontrolled type 1 diabetes mellitus with hyperglycemia (HCC) Assessment & Plan: Management per specialist. Patient is using a Dexcom 7 for glucose monitoring.  -Continue current insulin  regimen.    Orders: -     Microalbumin / creatinine urine ratio  Acquired hypothyroidism Assessment & Plan: Continue synthroid  125 mcg daily.   Obtain recent labs from outside provider.     Mixed hyperlipidemia Assessment & Plan: Patient is on Crestor . Recent cholesterol levels were not checked. -Order lipid panel.  Orders: -     Lipid panel  Age-related osteoporosis without current pathological fracture Assessment & Plan: Ordering Bone density scan.  Orders: -     DG Bone Density; Future  Need for hepatitis C screening test -     HCV Ab w Reflex to Quant PCR  Other orders -     Interpretation:     No orders of the defined types were placed in this encounter.   Orders Placed This Encounter  Procedures   DG Bone Density   Lipid panel   Microalbumin / creatinine urine ratio   HCV Ab w Reflex to Quant PCR   Interpretation:     Follow-up: Return in about 3 months (around 07/06/2024) for awv overdue, chronic follow up separate from AWV. Gladys Lamp I  Leal-Borjas,acting as a scribe for Mercy Stall, MD.,have documented all relevant documentation on the behalf of Mercy Stall, MD,as directed by  Mercy Stall, MD while in the presence of Mercy Stall, MD.   An After Visit Summary was printed and given to the patient.  Mercy Stall, MD [facility] [phone]

## 2024-04-05 ENCOUNTER — Ambulatory Visit (INDEPENDENT_AMBULATORY_CARE_PROVIDER_SITE_OTHER): Payer: PPO | Admitting: Family Medicine

## 2024-04-05 ENCOUNTER — Encounter: Payer: Self-pay | Admitting: Family Medicine

## 2024-04-05 VITALS — BP 136/72 | HR 88 | Temp 98.1°F | Ht 64.0 in | Wt 170.0 lb

## 2024-04-05 DIAGNOSIS — E039 Hypothyroidism, unspecified: Secondary | ICD-10-CM | POA: Diagnosis not present

## 2024-04-05 DIAGNOSIS — E1065 Type 1 diabetes mellitus with hyperglycemia: Secondary | ICD-10-CM | POA: Diagnosis not present

## 2024-04-05 DIAGNOSIS — E782 Mixed hyperlipidemia: Secondary | ICD-10-CM

## 2024-04-05 DIAGNOSIS — I10 Essential (primary) hypertension: Secondary | ICD-10-CM | POA: Diagnosis not present

## 2024-04-05 DIAGNOSIS — M810 Age-related osteoporosis without current pathological fracture: Secondary | ICD-10-CM

## 2024-04-05 DIAGNOSIS — Z1159 Encounter for screening for other viral diseases: Secondary | ICD-10-CM

## 2024-04-06 ENCOUNTER — Ambulatory Visit: Payer: Self-pay | Admitting: Family Medicine

## 2024-04-06 DIAGNOSIS — M810 Age-related osteoporosis without current pathological fracture: Secondary | ICD-10-CM | POA: Insufficient documentation

## 2024-04-06 LAB — MICROALBUMIN / CREATININE URINE RATIO
Creatinine, Urine: 72 mg/dL
Microalb/Creat Ratio: 16 mg/g{creat} (ref 0–29)
Microalbumin, Urine: 11.6 ug/mL

## 2024-04-06 LAB — LIPID PANEL
Chol/HDL Ratio: 2.2 ratio (ref 0.0–4.4)
Cholesterol, Total: 188 mg/dL (ref 100–199)
HDL: 84 mg/dL (ref >39)
LDL Chol Calc (NIH): 89 mg/dL (ref 0–99)
Triglycerides: 85 mg/dL (ref 0–149)
VLDL Cholesterol Cal: 15 mg/dL (ref 5–40)

## 2024-04-06 LAB — HCV AB W REFLEX TO QUANT PCR: HCV Ab: NONREACTIVE

## 2024-04-06 LAB — HCV INTERPRETATION

## 2024-04-06 NOTE — Assessment & Plan Note (Signed)
Continue synthroid 125 mcg daily.   Obtain recent labs from outside provider.

## 2024-04-06 NOTE — Assessment & Plan Note (Signed)
 Well controlled with Valsartan , Amlodipine , and Propranolol .  -Continue current antihypertensive regimen.

## 2024-04-06 NOTE — Assessment & Plan Note (Addendum)
 Ordering Bone density scan.

## 2024-04-06 NOTE — Assessment & Plan Note (Signed)
 Management per specialist. Patient is using a Dexcom 7 for glucose monitoring.  -Continue current insulin  regimen.

## 2024-04-06 NOTE — Assessment & Plan Note (Signed)
 Patient is on Crestor. Recent cholesterol levels were not checked. -Order lipid panel.

## 2024-04-20 ENCOUNTER — Ambulatory Visit (HOSPITAL_BASED_OUTPATIENT_CLINIC_OR_DEPARTMENT_OTHER)
Admission: RE | Admit: 2024-04-20 | Discharge: 2024-04-20 | Disposition: A | Discharge status: Home / Self Care | Source: Ambulatory Visit | Attending: Family Medicine | Admitting: Family Medicine

## 2024-04-20 ENCOUNTER — Other Ambulatory Visit: Payer: Self-pay

## 2024-04-20 DIAGNOSIS — E1159 Type 2 diabetes mellitus with other circulatory complications: Secondary | ICD-10-CM

## 2024-04-20 DIAGNOSIS — Z13820 Encounter for screening for osteoporosis: Secondary | ICD-10-CM | POA: Diagnosis not present

## 2024-04-20 DIAGNOSIS — Z780 Asymptomatic menopausal state: Secondary | ICD-10-CM

## 2024-04-20 DIAGNOSIS — M810 Age-related osteoporosis without current pathological fracture: Secondary | ICD-10-CM

## 2024-04-22 LAB — HM DIABETES EYE EXAM

## 2024-04-26 ENCOUNTER — Other Ambulatory Visit: Payer: Self-pay

## 2024-04-26 MED ORDER — DEXCOM G6 RECEIVER DEVI: 0 refills | Status: AC

## 2024-04-26 NOTE — Telephone Encounter (Signed)
 Copied from CRM [id number]. Topic: Clinical - Order For Equipment >> Apr 26, 2024  3:35 PM Kevelyn M wrote: Reason for CRM: Patient needs another Dexcom6 glucose monitor. Please order and let patient know when it has [phone]

## 2024-04-28 ENCOUNTER — Telehealth: Payer: Self-pay

## 2024-04-28 ENCOUNTER — Other Ambulatory Visit: Payer: Self-pay

## 2024-04-28 MED ORDER — DEXCOM G6 SENSOR MISC: 2 refills | Status: AC

## 2024-04-28 NOTE — Telephone Encounter (Signed)
 done  Copied from CRM [id number]. Topic: Clinical - Prescription Issue >> Apr 28, 2024  4:15 PM Adonis Hoot wrote: Reason for CRM: Patient called in stating that the dexcom 6 sensor that was sent into the pharmacy for her wasn't the right thing.She said that the controller was sent to pharmacy for her and she doesn't need that,she uses her phone.She needs the  dexcom 6 sensor that she puts on her stomach.  [id number] [place], [place] - [address]  Phone: [phone] Fax: [phone]

## 2024-04-30 ENCOUNTER — Telehealth: Payer: Self-pay

## 2024-04-30 NOTE — Telephone Encounter (Signed)
 Mychart message sent, 2 sensors available for pick up.  Copied from CRM [id number]. Topic: Clinical - Medication Question >> Apr 30, 2024 10:05 AM [name] wrote: Reason for CRM: The patient would like to know if there are any Continuous Glucose Sensor (DEXCOM G6 SENSOR) MISC [[id number]] available for them to pick up from the practice. Please contact further if/when possible

## 2024-05-19 ENCOUNTER — Other Ambulatory Visit: Payer: Self-pay

## 2024-06-04 ENCOUNTER — Encounter: Payer: Self-pay | Admitting: Advanced Practice Midwife

## 2024-06-07 ENCOUNTER — Ambulatory Visit: Payer: Self-pay

## 2024-06-07 NOTE — Telephone Encounter (Signed)
 FYI Only or Action Required?: FYI only for provider.  Patient was last seen in primary care on 04/05/2024 by Sherre Clapper, MD.  Called Nurse Triage reporting Foot Swelling.  Symptoms began several weeks ago.  Interventions attempted: Nothing.  Symptoms are: unchanged.  Triage Disposition: See Physician Within 24 Hours  Patient/caregiver understands and will follow disposition?: Yes, will follow disposition  Copied from CRM [id number]. Topic: Clinical - Red Word Triage >> Jun 07, 2024  8:49 AM Turkey B wrote: [facility] that prompted transfer to Nurse Triage: pt has wheezing, ankles swollen and mild pain Reason for Disposition  [1] MODERATE leg swelling (e.g., swelling extends up to knees) AND [2] new-onset or getting worse  Answer Assessment - Initial Assessment Questions 1. ONSET: When did the swelling start? (e.g., minutes, hours, days)     Couple weeks 2. LOCATION: What part of the leg is swollen?  Are both legs swollen or just one leg?     bilateral 3. SEVERITY: How bad is the swelling? (e.g., localized; mild, moderate, severe)     moderate 4. REDNESS: Is there redness or signs of infection?     denies 5. PAIN: Is the swelling painful to touch? If Yes, ask: How painful is it?   (Scale 1-10; mild, moderate or severe)     mild 6. FEVER: Do you have a fever? If Yes, ask: What is it, how was it measured, and when did it start?      denies 7. CAUSE: What do you think is causing the leg swelling?     Unsure,  8. MEDICAL HISTORY: Do you have a history of blood clots (e.g., DVT), cancer, heart failure, kidney disease, or liver failure?     Denies,  9. RECURRENT SYMPTOM: Have you had leg swelling before? If Yes, ask: When was the last time? What happened that time?     denies 10. OTHER SYMPTOMS: Do you have any other symptoms? (e.g., chest pain, difficulty breathing)       wheezing  Protocols used: Leg Swelling and Edema-A-AH

## 2024-06-08 ENCOUNTER — Encounter: Payer: Self-pay | Admitting: Family Medicine

## 2024-06-08 ENCOUNTER — Ambulatory Visit: Admitting: Family Medicine

## 2024-06-08 VITALS — BP 122/72 | HR 76 | Temp 98.0°F | Ht 64.0 in | Wt 174.0 lb

## 2024-06-08 DIAGNOSIS — M1A9XX1 Chronic gout, unspecified, with tophus (tophi): Secondary | ICD-10-CM

## 2024-06-08 DIAGNOSIS — R140 Abdominal distension (gaseous): Secondary | ICD-10-CM

## 2024-06-08 DIAGNOSIS — G629 Polyneuropathy, unspecified: Secondary | ICD-10-CM

## 2024-06-08 DIAGNOSIS — R062 Wheezing: Secondary | ICD-10-CM

## 2024-06-08 DIAGNOSIS — R600 Localized edema: Secondary | ICD-10-CM | POA: Diagnosis not present

## 2024-06-08 DIAGNOSIS — M810 Age-related osteoporosis without current pathological fracture: Secondary | ICD-10-CM

## 2024-06-08 MED ORDER — FUROSEMIDE 20 MG PO TABS: 20.0000 mg | ORAL_TABLET | Freq: Every day | ORAL | 3 refills | Status: DC

## 2024-06-08 NOTE — Patient Instructions (Signed)
 VISIT SUMMARY:  Today, we discussed your concerns about ankle swelling, a bulge on your left forearm post-surgery, wheezing during exercise, abdominal fullness, intermittent neuropathy, and osteoporosis.  YOUR PLAN:  LEFT FOREARM SWELLING: You have a soft swelling under the surgical scar from your elbow reconstruction surgery. -We will order blood work to check for gout. -Please send photographs of the swelling to Dr. Celena for evaluation. -We may refer you to Dr. Celena for further evaluation and possibly an x-ray.  ANKLE SWELLING: You have bilateral ankle swelling that worsens by the end of the day. -We will prescribe Lasix  20 mg to take once daily. -We will order blood work to assess the underlying causes.  WHEEZING ON EXERTION: You experience intermittent wheezing during exercise. -We will continue to monitor this condition.  ABDOMINAL BLOATING: You have significant bloating and weight gain over the past two years. -We will continue to monitor this condition.  NEUROPATHY: You have intermittent neuropathy in your feet and legs, described as quick electrical shocks. -We will continue to monitor this condition.  OSTEOPOROSIS: Your condition has progressed from osteopenia to osteoporosis. -Follow up with the nurse regarding switching to injectable medication.

## 2024-06-08 NOTE — Progress Notes (Signed)
 Acute Office Visit  Subjective:    Patient ID: [name], female    DOB: [birth year], 73 y.o.   MRN: [id number]  Chief Complaint  Patient presents with   Edema    Discussed the use of AI scribe software for clinical note transcription with the patient, who gave verbal consent to proceed.  History of Present Illness   [name] is a 73 year old female who presents with ankle swelling and concerns about her left elbow post-surgery.  Peripheral edema - Bilateral ankle swelling x 10 days, new onset without recent leg surgeries - Previously, ankle swelling only occurred after leg surgeries - Swelling worsens by the end of the day - Wears compression stockings - Has tried elevating her legs and compression stocking.  - While in the office noticed patient had stocking folded down, explained they should be pulled up to her knees without wrinkles.  Postoperative left forearm mass - Bulging on left forearm under surgical scar, appeared after elbow reconstruction surgery approximately six months ago - Mass is soft and non-tender - No evaluation by surgeon since appearance of the bulge - Developed after his surgery  Exertional wheezing - Wheezing during exercise and with increased respiratory effort - No associated chest pain - Engages in regular physical activity including chair exercises and [employer] visits - Reduced walking due to leg pain and foot difficulty  Abdominal fullness and weight gain - Sensation of abdominal swelling - Weight gain of 3-5 pounds over the past two years, perceived as excessive - Physical activity level remains consistent  Peripheral neuropathy symptoms - Episodes of neuropathy in feet and legs, described as quick electrical shocks - Symptoms are intermittent, not constant  Osteoporosis - Recent bone scan indicates progression from pre-osteoporosis to osteoporosis      Past Medical History:  Diagnosis Date   Abdominal bloating 08/12/2022    Acquired hypothyroidism 12/23/2021   Acute respiratory failure with hypoxemia 04/20/2014   Asthma 08/14/2015   Ataxia 12/23/2021   Capsulitis of metatarsophalangeal (MTP) joint of left foot 01/22/2018   Cough due to ACE inhibitor 09/30/2021   Daytime somnolence 12/23/2021   Essential hypertension 04/15/2014   External hemorrhoid 11/04/2022   GERD (gastroesophageal reflux disease)    Hearing loss of left ear due to cerumen impaction 10/25/2021   Imbalance 07/11/2022   Lacunar infarction 01/10/2021   right dorsal pons   Left tibial fracture 08/03/2019   Major depressive disorder 01/21/2022   Mixed hyperlipidemia 02/17/2022   Nocturia 09/30/2021   Obstructive sleep apnea    No CPAP   Primary localized osteoarthrosis, lower leg 04/18/2014   Snoring 09/30/2021   Tremor 06/29/2022   Type 1 diabetes mellitus with hyperglycemia 09/30/2021   Urine WBC increased 07/11/2022    Past Surgical History:  Procedure Laterality Date   APPENDECTOMY     APPLICATION OF WOUND VAC Left 08/03/2019   Procedure: Application Of Wound Vac;  Surgeon: Celena Sharper, MD;  Location: [facility] OR;  Service: Orthopedics;  Laterality: Left;   broken elbow Left 08/2023   CESAREAN SECTION     ESOPHAGOGASTRODUODENOSCOPY (EGD) WITH PROPOFOL  N/A 02/02/2016   Procedure: ESOPHAGOGASTRODUODENOSCOPY (EGD) WITH PROPOFOL ;  Surgeon: Donnice Vaughn Manes, MD;  Location[facility] ENDOSCOPY;  Service: Endoscopy;  Laterality: N/A;   HAND SURGERY     JOINT REPLACEMENT Bilateral    Knee    ORIF ANKLE FRACTURE Left 08/03/2019   with wound vac applied    ORIF ANKLE FRACTURE Left 08/03/2019   Procedure: OPEN REDUCTION  INTERNAL FIXATION (ORIF) ANKLE FRACTURE;  Surgeon: Celena Sharper, MD;  Location: [facility] OR;  Service: Orthopedics;  Laterality: Left;   ORIF HUMERUS FRACTURE Left 08/14/2023   Procedure: OPEN REDUCTION INTERNAL FIXATION (ORIF) DISTAL HUMERUS FRACTURE WITH OLECRANON OSTEOTOMY;  Surgeon: Celena Sharper, MD;  Location: [facility] OR;   Service: Orthopedics;  Laterality: Left;   TIBIALIS TENDON TRANSFER / REPAIR     TOTAL SHOULDER REPLACEMENT      Family History  Problem Relation Age of Onset   Cancer Mother        Colon cancer    Dementia Father        mid 15s, unspecified type   Neuropathy Father    Schizophrenia Brother     Social History   Socioeconomic History   Marital status: Single    Spouse name: Not on file   Number of children: 2   Years of education: 16   Highest education level: Bachelor's degree (e.g., BA, AB, BS)  Occupational History   Occupation: Retired    Comment: Runner, broadcasting/film/video  Tobacco Use   Smoking status: Never   Smokeless tobacco: Never  Vaping Use   Vaping status: Never Used  Substance and Sexual Activity   Alcohol use: Not Currently   Drug use: Never   Sexual activity: Not Currently  Other Topics Concern   Not on file  Social History Narrative   Right hand   Lives alone   Drinks caffeine   An apartment building   Social Drivers of Health   Financial Resource Strain: Low Risk  (09/04/2023)   Overall Financial Resource Strain (CARDIA)    Difficulty of Paying Living Expenses: Not hard at all  Food Insecurity: No Food Insecurity (06/08/2024)   Hunger Vital Sign    Worried About Running Out of Food in the Last Year: Never true    Ran Out of Food in the Last Year: Never true  Transportation Needs: No Transportation Needs (06/08/2024)   PRAPARE - Administrator, Civil Service (Medical): No    Lack of Transportation (Non-Medical): No  Physical Activity: Sufficiently Active (09/04/2023)   Exercise Vital Sign    Days of Exercise per Week: 7 days    Minutes of Exercise per Session: 30 min  Stress: No Stress Concern Present (09/04/2023)   [employer] of Occupational Health - Occupational Stress Questionnaire    Feeling of Stress : Not at all  Social Connections: Socially Isolated (09/04/2023)   Social Connection and Isolation Panel    Frequency of Communication  with Friends and Family: Three times a week    Frequency of Social Gatherings with Friends and Family: Three times a week    Attends Religious Services: Never    Active Member of Clubs or Organizations: No    Attends Banker Meetings: Never    Marital Status: Divorced  Catering manager Violence: Not At Risk (06/08/2024)   Humiliation, Afraid, Rape, and Kick questionnaire    Fear of Current or Ex-Partner: No    Emotionally Abused: No    Physically Abused: No    Sexually Abused: No    Outpatient Medications Prior to Visit  Medication Sig Dispense Refill   alendronate  (FOSAMAX ) 70 MG tablet Take 1 tablet (70 mg total) by mouth once a week.     amLODipine  (NORVASC ) 10 MG tablet TAKE 1 TABLET BY MOUTH EVERY DAY 90 tablet 0   aspirin  EC 81 MG tablet Take 81 mg by mouth daily.  calcium  carbonate (OSCAL) 1500 (600 Ca) MG TABS tablet Take 600 mg of elemental calcium  by mouth daily with breakfast.     cholecalciferol  (VITAMIN D3) 25 MCG (1000 UNIT) tablet Take 1,000 Units by mouth daily.     Continuous Glucose Receiver (DEXCOM G6 RECEIVER) DEVI E11.21 Use as directed 1 each 0   Continuous Glucose Sensor (DEXCOM G6 SENSOR) MISC E11.21 Change sensor every 10 days 3 each 2   docusate sodium  (COLACE) 100 MG capsule Take 1 capsule (100 mg total) by mouth 2 (two) times daily for 5 days then as directed by MD 100 capsule 0   ferrous sulfate 325 (65 FE) MG tablet Take 325 mg by mouth daily with breakfast.     Glucagon  (GVOKE HYPOPEN  2-PACK) 0.5 MG/0.1ML SOAJ Inject 0.5 mg into the skin daily. As needed for low sugars less than 60 if does not respond oral sugar. 0.2 mL 2   ibuprofen (ADVIL) 600 MG tablet Take 600 mg by mouth every 6 (six) hours as needed.     insulin  aspart (NOVOLOG ) 100 UNIT/ML injection Inject 2-3 Units into the skin 3 (three) times daily before meals. Sliding scale Start with 3 units, for 15 carb's add and additional unit     insulin  degludec (TRESIBA  FLEXTOUCH) 100  UNIT/ML FlexTouch Pen Inject 19 Units into the skin at bedtime.     levothyroxine  (SYNTHROID ) 125 MCG tablet TAKE 1 TABLET BY MOUTH EVERY DAY BEFORE BREAKFAST 90 tablet 1   LORazepam  (ATIVAN ) 0.5 MG tablet TAKE 1 TABLET BY MOUTH EVERYDAY AT BEDTIME 30 tablet 2   omeprazole  (PRILOSEC) 40 MG capsule TAKE 1 CAPSULE BY MOUTH EVERY DAY 90 capsule 1   OVER THE COUNTER MEDICATION Take 1 tablet by mouth daily. Care 1 stool softener     polyethylene glycol powder (GLYCOLAX /MIRALAX ) 17 GM/SCOOP powder Take 0.5 Containers by mouth daily. 1 Scoop     propranolol  ER (INDERAL  LA) 60 MG 24 hr capsule TAKE 1 CAPSULE BY MOUTH EVERY DAY 90 capsule 1   rosuvastatin  (CRESTOR ) 40 MG tablet TAKE 1 TABLET BY MOUTH EVERY DAY 90 tablet 1   valsartan  (DIOVAN ) 320 MG tablet TAKE 1 TABLET BY MOUTH EVERY DAY 90 tablet 0   venlafaxine  XR (EFFEXOR -XR) 150 MG 24 hr capsule TAKE 1 CAPSULE BY MOUTH EVERY DAY 90 capsule 1   vitamin B-12 (CYANOCOBALAMIN) 100 MCG tablet Take 100 mcg by mouth daily.     psyllium (METAMUCIL) 58.6 % powder Take 1 packet by mouth daily. 1 Scoop (Patient not taking: Reported on 12/02/2023)     No facility-administered medications prior to visit.    Allergies  Allergen Reactions   Lisinopril Cough   Zetia  [Ezetimibe ]     CONFUSION    Review of Systems  Constitutional:  Negative for chills, fatigue and fever.  HENT:  Negative for congestion, ear pain, rhinorrhea and sore throat.   Respiratory:  Negative for cough and shortness of breath.   Cardiovascular:  Negative for chest pain.  Gastrointestinal:  Negative for abdominal pain, constipation, diarrhea, nausea and vomiting.       Feels bloated.  Genitourinary:  Negative for dysuria and urgency.  Musculoskeletal:  Negative for back pain and myalgias.  Neurological:  Negative for dizziness, weakness, light-headedness and headaches.  Psychiatric/Behavioral:  Negative for dysphoric mood. The patient is not nervous/anxious.        Objective:         06/08/2024   10:59 AM 04/05/2024   10:31 AM 12/17/2023  10:56 AM  Vitals with BMI  Height 5' 4 5' 4 5' 4  Weight 174 lbs 170 lbs 160 lbs  BMI 29.85 29.17 27.45  Systolic 122 136 875  Diastolic 72 72 62  Pulse 76 88 92    No data found.   Physical Exam Vitals reviewed.  Constitutional:      Appearance: Normal appearance. She is normal weight.  Neck:     Vascular: No carotid bruit.  Cardiovascular:     Rate and Rhythm: Normal rate and regular rhythm.     Heart sounds: Normal heart sounds.  Pulmonary:     Effort: Pulmonary effort is normal. No respiratory distress.     Breath sounds: Normal breath sounds.  Abdominal:     General: Abdomen is flat. Bowel sounds are normal.     Palpations: Abdomen is soft.     Tenderness: There is no abdominal tenderness.  Musculoskeletal:     Right lower leg: Edema present.     Left lower leg: Edema present.  Neurological:     Mental Status: She is alert and oriented to person, place, and time.  Psychiatric:        Mood and Affect: Mood normal.        Behavior: Behavior normal.        Health Maintenance Due  Topic Date Due   Medicare Annual Wellness (AWV)  Never done   Hepatitis B Vaccines (3 of 3 - 19+ 3-dose series) 01/23/2016   COVID-19 Vaccine (8 - 2024-25 season) 09/12/2023   OPHTHALMOLOGY EXAM  01/30/2024   HEMOGLOBIN A1C  06/02/2024       Topic Date Due   Hepatitis B Vaccines (3 of 3 - 19+ 3-dose series) 01/23/2016     Lab Results  Component Value Date   TSH 4.500 06/08/2024   Lab Results  Component Value Date   WBC 6.1 06/08/2024   HGB 12.7 06/08/2024   HCT 39.0 06/08/2024   MCV 97 06/08/2024   PLT 284 06/08/2024   Lab Results  Component Value Date   NA 137 06/08/2024   K 4.1 06/08/2024   CO2 23 06/08/2024   GLUCOSE 132 (H) 06/08/2024   BUN 19 06/08/2024   CREATININE 0.76 06/08/2024   BILITOT 0.2 06/08/2024   ALKPHOS 150 (H) 06/08/2024   AST 19 06/08/2024   ALT 18 06/08/2024   PROT 6.4  06/08/2024   ALBUMIN 4.3 06/08/2024   CALCIUM  9.1 06/08/2024   ANIONGAP 10 08/15/2023   EGFR 82 06/08/2024   Lab Results  Component Value Date   CHOL 188 04/05/2024   Lab Results  Component Value Date   HDL 84 04/05/2024   Lab Results  Component Value Date   LDLCALC 89 04/05/2024   Lab Results  Component Value Date   TRIG 85 04/05/2024   Lab Results  Component Value Date   CHOLHDL 2.2 04/05/2024   Lab Results  Component Value Date   HGBA1C 9.3 12/04/2023       Assessment & Plan:  Pedal edema Assessment & Plan: Bilateral ankle swelling worsens by end of day. Compression stockings helpful. Not attributed to amlodipine . Diuretic proposed for fluid retention. - Prescribe Lasix  20 mg once daily. - Order blood work to assess underlying causes.  Orders: -     CBC with Differential/Platelet -     Comprehensive metabolic panel with GFR -     TSH -     Furosemide ; Take 1 tablet (20 mg total) by mouth daily.  Dispense: 30 tablet; Refill: 3  Tophi Assessment & Plan: Soft swelling under surgical scar post-elbow reconstruction. Differential includes post-surgical changes or gout, though gout is less likely due to absence of pain. Metal hardware complicates assessment. - Order blood work for gout. - Send photographs of swelling to Dr. Celena for evaluation. - Consider referral to Dr. Celena for further evaluation and possible x-ray.  Orders: -     Uric acid  Wheezing Assessment & Plan: Intermittent wheezing during exercise. No asthma history. Previous cigarette smoke exposure caused wheezing. Current cause unclear.   Abdominal bloating Assessment & Plan: Significant bloating and weight gain over two years. Decreased activity due to leg pain. Previous gastroenterologist evaluation inconclusive.   Neuropathy Assessment & Plan: Intermittent neuropathy in feet and legs, described as quick electrical shocks. Hesitant to start medication due to potential swelling side  effects.   Age-related osteoporosis without current pathological fracture Assessment & Plan: Progression from osteopenia to osteoporosis on recent bone scan. Advised to switch from oral to injectable medication. - Follow up with nurse regarding switch to injectable medication.      Meds ordered this encounter  Medications   furosemide  (LASIX ) 20 MG tablet    Sig: Take 1 tablet (20 mg total) by mouth daily.    Dispense:  30 tablet    Refill:  3    Orders Placed This Encounter  Procedures   CBC with Differential/Platelet   Comprehensive metabolic panel with GFR   TSH   Uric acid     Follow-up: Return if symptoms worsen or fail to improve.  An After Visit Summary was printed and given to the patient.  LILLETTE Kato I Leal-Borjas,acting as a scribe for Abigail Free, MD.,have documented all relevant documentation on the behalf of Abigail Free, MD,as directed by  Abigail Free, MD while in the presence of Abigail Free, MD.   I attest that I have reviewed this visit and agree with the plan scribed by my staff.  Abigail Free, MD [facility] [phone]

## 2024-06-09 ENCOUNTER — Ambulatory Visit: Payer: Self-pay | Admitting: Family Medicine

## 2024-06-09 ENCOUNTER — Telehealth: Payer: Self-pay

## 2024-06-09 LAB — COMPREHENSIVE METABOLIC PANEL WITH GFR
ALT: 18 IU/L (ref 0–32)
AST: 19 IU/L (ref 0–40)
Albumin: 4.3 g/dL (ref 3.8–4.8)
Alkaline Phosphatase: 150 IU/L — ABNORMAL HIGH (ref 44–121)
BUN/Creatinine Ratio: 25 (ref 12–28)
BUN: 19 mg/dL (ref 8–27)
Bilirubin Total: 0.2 mg/dL (ref 0.0–1.2)
CO2: 23 mmol/L (ref 20–29)
Calcium: 9.1 mg/dL (ref 8.7–10.3)
Chloride: 101 mmol/L (ref 96–106)
Creatinine, Ser: 0.76 mg/dL (ref 0.57–1.00)
Globulin, Total: 2.1 g/dL (ref 1.5–4.5)
Glucose: 132 mg/dL — ABNORMAL HIGH (ref 70–99)
Potassium: 4.1 mmol/L (ref 3.5–5.2)
Sodium: 137 mmol/L (ref 134–144)
Total Protein: 6.4 g/dL (ref 6.0–8.5)
eGFR: 82 mL/min/1.73 (ref >59)

## 2024-06-09 LAB — CBC WITH DIFFERENTIAL/PLATELET
Basophils Absolute: 0.1 x10E3/uL (ref 0.0–0.2)
Basos: 1 %
EOS (ABSOLUTE): 0.5 x10E3/uL — ABNORMAL HIGH (ref 0.0–0.4)
Eos: 7 %
Hematocrit: 39 % (ref 34.0–46.6)
Hemoglobin: 12.7 g/dL (ref 11.1–15.9)
Immature Grans (Abs): 0 x10E3/uL (ref 0.0–0.1)
Immature Granulocytes: 0 %
Lymphocytes Absolute: 1.2 x10E3/uL (ref 0.7–3.1)
Lymphs: 21 %
MCH: 31.6 pg (ref 26.6–33.0)
MCHC: 32.6 g/dL (ref 31.5–35.7)
MCV: 97 fL (ref 79–97)
Monocytes Absolute: 0.5 x10E3/uL (ref 0.1–0.9)
Monocytes: 9 %
Neutrophils Absolute: 3.7 x10E3/uL (ref 1.4–7.0)
Neutrophils: 62 %
Platelets: 284 x10E3/uL (ref 150–450)
RBC: 4.02 x10E6/uL (ref 3.77–5.28)
RDW: 11.7 % (ref 11.7–15.4)
WBC: 6.1 x10E3/uL (ref 3.4–10.8)

## 2024-06-09 LAB — URIC ACID: Uric Acid: 4.4 mg/dL (ref 3.1–7.9)

## 2024-06-09 LAB — TSH: TSH: 4.5 u[IU]/mL (ref 0.450–4.500)

## 2024-06-09 NOTE — Telephone Encounter (Signed)
 Spoke with patient about benefit verification from [employer] for Evenity and Prolia - patient will be responsible for 20%.  Patient declines both due to cost.    Would she be a candidate for Forteo?  If so, she can apply for patient assistance thru [employer].

## 2024-06-12 DIAGNOSIS — M1A9XX1 Chronic gout, unspecified, with tophus (tophi): Secondary | ICD-10-CM | POA: Insufficient documentation

## 2024-06-12 DIAGNOSIS — G629 Polyneuropathy, unspecified: Secondary | ICD-10-CM | POA: Insufficient documentation

## 2024-06-12 DIAGNOSIS — R600 Localized edema: Secondary | ICD-10-CM | POA: Insufficient documentation

## 2024-06-12 DIAGNOSIS — R062 Wheezing: Secondary | ICD-10-CM | POA: Insufficient documentation

## 2024-06-12 NOTE — Assessment & Plan Note (Signed)
 Intermittent wheezing during exercise. No asthma history. Previous cigarette smoke exposure caused wheezing. Current cause unclear.

## 2024-06-12 NOTE — Assessment & Plan Note (Signed)
 Soft swelling under surgical scar post-elbow reconstruction. Differential includes post-surgical changes or gout, though gout is less likely due to absence of pain. Metal hardware complicates assessment. - Order blood work for gout. - Send photographs of swelling to Dr. Celena for evaluation. - Consider referral to Dr. Celena for further evaluation and possible x-ray.

## 2024-06-12 NOTE — Assessment & Plan Note (Signed)
 Bilateral ankle swelling worsens by end of day. Compression stockings helpful. Not attributed to amlodipine . Diuretic proposed for fluid retention. - Prescribe Lasix  20 mg once daily. - Order blood work to assess underlying causes.

## 2024-06-12 NOTE — Assessment & Plan Note (Signed)
 Intermittent neuropathy in feet and legs, described as quick electrical shocks. Hesitant to start medication due to potential swelling side effects.

## 2024-06-12 NOTE — Assessment & Plan Note (Signed)
 Significant bloating and weight gain over two years. Decreased activity due to leg pain. Previous gastroenterologist evaluation inconclusive.

## 2024-06-12 NOTE — Assessment & Plan Note (Signed)
 Progression from osteopenia to osteoporosis on recent bone scan. Advised to switch from oral to injectable medication. - Follow up with nurse regarding switch to injectable medication.

## 2024-06-20 ENCOUNTER — Other Ambulatory Visit: Payer: Self-pay | Admitting: Family Medicine

## 2024-06-20 DIAGNOSIS — I1A0 Resistant hypertension: Secondary | ICD-10-CM

## 2024-06-21 NOTE — Progress Notes (Unsigned)
 Acute Office Visit  Subjective:    Patient ID: [name], female    DOB: 09/16/1951, 73 y.o.   MRN: [id number]  No chief complaint on file.   HPI: Patient is in today for ***  Past Medical History:  Diagnosis Date   Abdominal bloating 08/12/2022   Acquired hypothyroidism 12/23/2021   Acute respiratory failure with hypoxemia 04/20/2014   Asthma 08/14/2015   Ataxia 12/23/2021   Capsulitis of metatarsophalangeal (MTP) joint of left foot 01/22/2018   Cough due to ACE inhibitor 09/30/2021   Daytime somnolence 12/23/2021   Essential hypertension 04/15/2014   External hemorrhoid 11/04/2022   GERD (gastroesophageal reflux disease)    Hearing loss of left ear due to cerumen impaction 10/25/2021   Imbalance 07/11/2022   Lacunar infarction 01/10/2021   right dorsal pons   Left tibial fracture 08/03/2019   Major depressive disorder 01/21/2022   Mixed hyperlipidemia 02/17/2022   Nocturia 09/30/2021   Obstructive sleep apnea    No CPAP   Primary localized osteoarthrosis, lower leg 04/18/2014   Snoring 09/30/2021   Tremor 06/29/2022   Type 1 diabetes mellitus with hyperglycemia 09/30/2021   Urine WBC increased 07/11/2022    Past Surgical History:  Procedure Laterality Date   APPENDECTOMY     APPLICATION OF WOUND VAC Left 08/03/2019   Procedure: Application Of Wound Vac;  Surgeon: Celena Sharper, MD;  Location: [facility] OR;  Service: Orthopedics;  Laterality: Left;   broken elbow Left 08/2023   CESAREAN SECTION     ESOPHAGOGASTRODUODENOSCOPY (EGD) WITH PROPOFOL  N/A 02/02/2016   Procedure: ESOPHAGOGASTRODUODENOSCOPY (EGD) WITH PROPOFOL ;  Surgeon: Donnice Vaughn Manes, MD;  Location: [facility] ENDOSCOPY;  Service: Endoscopy;  Laterality: N/A;   HAND SURGERY     JOINT REPLACEMENT Bilateral    Knee    ORIF ANKLE FRACTURE Left 08/03/2019   with wound vac applied    ORIF ANKLE FRACTURE Left 08/03/2019   Procedure: OPEN REDUCTION INTERNAL FIXATION (ORIF) ANKLE FRACTURE;  Surgeon: Celena Sharper, MD;  Location: [facility] OR;  Service: Orthopedics;  Laterality: Left;   ORIF HUMERUS FRACTURE Left 08/14/2023   Procedure: OPEN REDUCTION INTERNAL FIXATION (ORIF) DISTAL HUMERUS FRACTURE WITH OLECRANON OSTEOTOMY;  Surgeon: Celena Sharper, MD;  Location: [facility] OR;  Service: Orthopedics;  Laterality: Left;   TIBIALIS TENDON TRANSFER / REPAIR     TOTAL SHOULDER REPLACEMENT      Family History  Problem Relation Age of Onset   Cancer Mother        Colon cancer    Dementia Father        mid 77s, unspecified type   Neuropathy Father    Schizophrenia Brother     Social History   Socioeconomic History   Marital status: Single    Spouse name: Not on file   Number of children: 2   Years of education: 16   Highest education level: Bachelor's degree (e.g., BA, AB, BS)  Occupational History   Occupation: Retired    Comment: Runner, broadcasting/film/video  Tobacco Use   Smoking status: Never   Smokeless tobacco: Never  Vaping Use   Vaping status: Never Used  Substance and Sexual Activity   Alcohol use: Not Currently   Drug use: Never   Sexual activity: Not Currently  Other Topics Concern   Not on file  Social History Narrative   Right hand   Lives alone   Drinks caffeine   An apartment building   Social Drivers of Health   Financial Resource Strain: Low Risk  (  09/04/2023)   Overall Financial Resource Strain (CARDIA)    Difficulty of Paying Living Expenses: Not hard at all  Food Insecurity: No Food Insecurity (06/08/2024)   Hunger Vital Sign    Worried About Running Out of Food in the Last Year: Never true    Ran Out of Food in the Last Year: Never true  Transportation Needs: No Transportation Needs (06/08/2024)   PRAPARE - Administrator, Civil Service (Medical): No    Lack of Transportation (Non-Medical): No  Physical Activity: Sufficiently Active (09/04/2023)   Exercise Vital Sign    Days of Exercise per Week: 7 days    Minutes of Exercise per Session: 30 min  Stress: No Stress  Concern Present (09/04/2023)   [employer] of Occupational Health - Occupational Stress Questionnaire    Feeling of Stress : Not at all  Social Connections: Socially Isolated (09/04/2023)   Social Connection and Isolation Panel    Frequency of Communication with Friends and Family: Three times a week    Frequency of Social Gatherings with Friends and Family: Three times a week    Attends Religious Services: Never    Active Member of Clubs or Organizations: No    Attends Banker Meetings: Never    Marital Status: Divorced  Catering manager Violence: Not At Risk (06/08/2024)   Humiliation, Afraid, Rape, and Kick questionnaire    Fear of Current or Ex-Partner: No    Emotionally Abused: No    Physically Abused: No    Sexually Abused: No    Outpatient Medications Prior to Visit  Medication Sig Dispense Refill   alendronate  (FOSAMAX ) 70 MG tablet Take 1 tablet (70 mg total) by mouth once a week.     amLODipine  (NORVASC ) 10 MG tablet TAKE 1 TABLET BY MOUTH EVERY DAY 90 tablet 0   aspirin  EC 81 MG tablet Take 81 mg by mouth daily.     calcium  carbonate (OSCAL) 1500 (600 Ca) MG TABS tablet Take 600 mg of elemental calcium  by mouth daily with breakfast.     cholecalciferol  (VITAMIN D3) 25 MCG (1000 UNIT) tablet Take 1,000 Units by mouth daily.     Continuous Glucose Receiver (DEXCOM G6 RECEIVER) DEVI E11.21 Use as directed 1 each 0   Continuous Glucose Sensor (DEXCOM G6 SENSOR) MISC E11.21 Change sensor every 10 days 3 each 2   docusate sodium  (COLACE) 100 MG capsule Take 1 capsule (100 mg total) by mouth 2 (two) times daily for 5 days then as directed by MD 100 capsule 0   ferrous sulfate 325 (65 FE) MG tablet Take 325 mg by mouth daily with breakfast.     furosemide  (LASIX ) 20 MG tablet Take 1 tablet (20 mg total) by mouth daily. 30 tablet 3   Glucagon  (GVOKE HYPOPEN  2-PACK) 0.5 MG/0.1ML SOAJ Inject 0.5 mg into the skin daily. As needed for low sugars less than 60 if does not  respond oral sugar. 0.2 mL 2   ibuprofen (ADVIL) 600 MG tablet Take 600 mg by mouth every 6 (six) hours as needed.     insulin  aspart (NOVOLOG ) 100 UNIT/ML injection Inject 2-3 Units into the skin 3 (three) times daily before meals. Sliding scale Start with 3 units, for 15 carb's add and additional unit     insulin  degludec (TRESIBA  FLEXTOUCH) 100 UNIT/ML FlexTouch Pen Inject 19 Units into the skin at bedtime.     levothyroxine  (SYNTHROID ) 125 MCG tablet TAKE 1 TABLET BY MOUTH EVERY DAY BEFORE BREAKFAST  90 tablet 1   LORazepam  (ATIVAN ) 0.5 MG tablet TAKE 1 TABLET BY MOUTH EVERYDAY AT BEDTIME 30 tablet 2   omeprazole  (PRILOSEC) 40 MG capsule TAKE 1 CAPSULE BY MOUTH EVERY DAY 90 capsule 1   OVER THE COUNTER MEDICATION Take 1 tablet by mouth daily. Care 1 stool softener     polyethylene glycol powder (GLYCOLAX /MIRALAX ) 17 GM/SCOOP powder Take 0.5 Containers by mouth daily. 1 Scoop     propranolol  ER (INDERAL  LA) 60 MG 24 hr capsule TAKE 1 CAPSULE BY MOUTH EVERY DAY 90 capsule 1   rosuvastatin  (CRESTOR ) 40 MG tablet TAKE 1 TABLET BY MOUTH EVERY DAY 90 tablet 1   valsartan  (DIOVAN ) 320 MG tablet TAKE 1 TABLET BY MOUTH EVERY DAY 90 tablet 0   venlafaxine  XR (EFFEXOR -XR) 150 MG 24 hr capsule TAKE 1 CAPSULE BY MOUTH EVERY DAY 90 capsule 1   vitamin B-12 (CYANOCOBALAMIN) 100 MCG tablet Take 100 mcg by mouth daily.     No facility-administered medications prior to visit.    Allergies  Allergen Reactions   Lisinopril Cough   Zetia  [Ezetimibe ]     CONFUSION    Review of Systems  Constitutional:  Negative for appetite change, fatigue and fever.  HENT:  Negative for congestion, ear pain, sinus pressure and sore throat.   Respiratory:  Negative for cough, chest tightness, shortness of breath and wheezing.   Cardiovascular:  Positive for leg swelling. Negative for chest pain and palpitations.  Gastrointestinal:  Negative for abdominal pain, constipation, diarrhea, nausea and vomiting.  Genitourinary:   Negative for dysuria and hematuria.  Musculoskeletal:  Negative for arthralgias, back pain, joint swelling and myalgias.  Skin:  Negative for rash.  Neurological:  Negative for dizziness, weakness and headaches.  Psychiatric/Behavioral:  Negative for dysphoric mood. The patient is not nervous/anxious.        Objective:        06/08/2024   10:59 AM 04/05/2024   10:31 AM 12/17/2023   10:56 AM  Vitals with BMI  Height 5' 4 5' 4 5' 4  Weight 174 lbs 170 lbs 160 lbs  BMI 29.85 29.17 27.45  Systolic 122 136 875  Diastolic 72 72 62  Pulse 76 88 92    No data found.   Physical Exam  Health Maintenance Due  Topic Date Due   Medicare Annual Wellness (AWV)  Never done   Hepatitis B Vaccines (3 of 3 - 19+ 3-dose series) 01/23/2016   COVID-19 Vaccine (8 - 2024-25 season) 09/12/2023   OPHTHALMOLOGY EXAM  01/30/2024   HEMOGLOBIN A1C  06/02/2024   INFLUENZA VACCINE  06/18/2024       Topic Date Due   Hepatitis B Vaccines (3 of 3 - 19+ 3-dose series) 01/23/2016     Lab Results  Component Value Date   TSH 4.500 06/08/2024   Lab Results  Component Value Date   WBC 6.1 06/08/2024   HGB 12.7 06/08/2024   HCT 39.0 06/08/2024   MCV 97 06/08/2024   PLT 284 06/08/2024   Lab Results  Component Value Date   NA 137 06/08/2024   K 4.1 06/08/2024   CO2 23 06/08/2024   GLUCOSE 132 (H) 06/08/2024   BUN 19 06/08/2024   CREATININE 0.76 06/08/2024   BILITOT 0.2 06/08/2024   ALKPHOS 150 (H) 06/08/2024   AST 19 06/08/2024   ALT 18 06/08/2024   PROT 6.4 06/08/2024   ALBUMIN 4.3 06/08/2024   CALCIUM  9.1 06/08/2024   ANIONGAP 10 08/15/2023  EGFR 82 06/08/2024   Lab Results  Component Value Date   CHOL 188 04/05/2024   Lab Results  Component Value Date   HDL 84 04/05/2024   Lab Results  Component Value Date   LDLCALC 89 04/05/2024   Lab Results  Component Value Date   TRIG 85 04/05/2024   Lab Results  Component Value Date   CHOLHDL 2.2 04/05/2024   Lab Results   Component Value Date   HGBA1C 9.3 12/04/2023       Assessment & Plan:  Pedal edema     No orders of the defined types were placed in this encounter.   No orders of the defined types were placed in this encounter.    Follow-up: No follow-ups on file.  An After Visit Summary was printed and given to the patient.    I,Lauren M Auman,acting as a Neurosurgeon for [employer], PA.,have documented all relevant documentation on the behalf of Nola Angles, PA,as directed by  Nola Angles, PA while in the presence of Nola Angles, [place].    Nola Angles, [place] [facility] [phone]

## 2024-06-22 ENCOUNTER — Encounter: Payer: Self-pay | Admitting: Physician Assistant

## 2024-06-22 ENCOUNTER — Ambulatory Visit (INDEPENDENT_AMBULATORY_CARE_PROVIDER_SITE_OTHER): Admitting: Physician Assistant

## 2024-06-22 VITALS — BP 138/78 | HR 73 | Temp 97.8°F | Ht 64.0 in | Wt 176.0 lb

## 2024-06-22 DIAGNOSIS — R140 Abdominal distension (gaseous): Secondary | ICD-10-CM | POA: Diagnosis not present

## 2024-06-22 DIAGNOSIS — G629 Polyneuropathy, unspecified: Secondary | ICD-10-CM

## 2024-06-22 DIAGNOSIS — R600 Localized edema: Secondary | ICD-10-CM | POA: Diagnosis not present

## 2024-06-22 LAB — HEMOGLOBIN A1C: Hemoglobin A1C: 8

## 2024-06-22 NOTE — Assessment & Plan Note (Signed)
 Persistent abdominal bloating with no clear cause identified. Differential includes constipation, gas, or other gastrointestinal issues. - Order abdominal x-ray. - Consider gastroenterologist referral if x-ray inconclusive. - Schedule x-ray at local medical center.

## 2024-06-22 NOTE — Assessment & Plan Note (Signed)
 Peripheral neuropathy, mild, intermittent Mild, intermittent peripheral neuropathy with occasional tingling.

## 2024-06-22 NOTE — Patient Instructions (Signed)
 VISIT SUMMARY:  During today's visit, we discussed your ongoing issues with leg swelling and abdominal bloating. We also reviewed your management of constipation, irritable bowel syndrome, type 1 diabetes, and mild peripheral neuropathy.  YOUR PLAN:  -CHRONIC LEG SWELLING: Chronic leg swelling can be due to various reasons such as venous insufficiency or heart issues. We will restart your Lasix  medication and check your electrolyte levels in six weeks. Please monitor for any changes in swelling or symptoms.  -ABDOMINAL BLOATING: Abdominal bloating can be caused by constipation, gas, or other gastrointestinal issues. We will order an abdominal x-ray to investigate further. If the x-ray does not provide answers, we may refer you to a gastroenterologist. Please schedule the x-ray at your local medical center.  -CONSTIPATION: Constipation is when you have difficulty passing stools. Continue with your current regimen of Miralax , stool softeners, and Unbloat to manage this issue.  -IRRITABLE BOWEL SYNDROME: Irritable bowel syndrome is a condition that affects the large intestine and can cause symptoms like cramping, abdominal pain, bloating, gas, and diarrhea or constipation. Continue with your current management plan.  -TYPE 1 DIABETES MELLITUS: Type 1 diabetes is a condition where your body does not produce insulin . Your blood sugar levels are well-controlled with an A1c of 5.5. Continue with your current diabetes management and follow up with your endocrinologist to check your A1c.  -PERIPHERAL NEUROPATHY: Peripheral neuropathy is a condition that results in tingling, numbness, and pain in your extremities. Your symptoms are mild and intermittent. No changes to your current management are needed at this time.  INSTRUCTIONS:  Please schedule an abdominal x-ray at your local medical center. Follow up with your endocrinologist to check your A1c levels. We will check your electrolyte levels in six weeks  to monitor the effects of restarting Lasix .

## 2024-06-22 NOTE — Assessment & Plan Note (Signed)
 Chronic leg swelling with some improvement. Possible venous insufficiency or cardiac issues. Electrolytes normal. - Restart Lasix . - Check electrolyte levels in six weeks. - Monitor for changes in swelling or symptoms.

## 2024-06-23 ENCOUNTER — Other Ambulatory Visit: Payer: Self-pay | Admitting: Family Medicine

## 2024-06-23 DIAGNOSIS — I152 Hypertension secondary to endocrine disorders: Secondary | ICD-10-CM

## 2024-06-24 ENCOUNTER — Ambulatory Visit (INDEPENDENT_AMBULATORY_CARE_PROVIDER_SITE_OTHER)
Admission: RE | Admit: 2024-06-24 | Discharge: 2024-06-24 | Disposition: A | Discharge status: Home / Self Care | Source: Ambulatory Visit | Attending: Physician Assistant | Admitting: Physician Assistant

## 2024-06-24 DIAGNOSIS — R140 Abdominal distension (gaseous): Secondary | ICD-10-CM

## 2024-07-01 ENCOUNTER — Ambulatory Visit: Payer: Self-pay

## 2024-07-01 NOTE — Telephone Encounter (Signed)
 FYI Only or Action Required?: Action required by provider: lab or test result follow-up needed.  Patient was last seen in primary care on 06/22/2024 by Milon Cleaves, PA.  Called Nurse Triage reporting Imaging Results.  Triage Disposition: Call PCP Within 24 Hours  Patient/caregiver understands and will follow disposition?: Yes  Copied from CRM [id number]. Topic: Clinical - Lab/Test Results >> Jul 01, 2024  9:36 AM Gustabo D wrote: Go over lab results Reason for Disposition  [1] Follow-up call from patient regarding patient's clinical status AND [2] information NON-URGENT  Answer Assessment - Initial Assessment Questions 1. REASON FOR CALL or QUESTION: What is your reason for calling today? or How can I best     Patient calling about her x-ray results from 06/24/2024. Patient is asking if she can get her results. Per chart-x-ray hasn't been read yet. Patient would like a call back.  2. CALLER: Document the source of call. (e.g., laboratory staff, caregiver or patient).     patient  Protocols used: PCP Call - No Triage-A-AH

## 2024-07-05 NOTE — Telephone Encounter (Signed)
 Will call patient with results once available.

## 2024-07-07 ENCOUNTER — Telehealth: Payer: Self-pay

## 2024-07-07 ENCOUNTER — Ambulatory Visit (INDEPENDENT_AMBULATORY_CARE_PROVIDER_SITE_OTHER): Admitting: Physician Assistant

## 2024-07-07 ENCOUNTER — Encounter: Payer: Self-pay | Admitting: Physician Assistant

## 2024-07-07 VITALS — BP 158/82 | HR 67 | Temp 97.8°F | Ht 64.0 in | Wt 173.0 lb

## 2024-07-07 DIAGNOSIS — K5909 Other constipation: Secondary | ICD-10-CM | POA: Diagnosis not present

## 2024-07-07 NOTE — Patient Instructions (Signed)
 VISIT SUMMARY:  Today, you visited us  due to sudden dizziness, nausea, and a severe headache. We discussed your symptoms, medical history, and current medications. Your son, Leonce, accompanied you during the visit.  YOUR PLAN:  -CONSTIPATION WITH ABDOMINAL BLOATING AND STOOL BURDEN: Constipation means having difficulty emptying your bowels, often associated with hard stools. You have a moderate stool burden, which may be contributing to your nausea and dizziness. We administered Miralax  today and recommend continuing it for a few days. We also prescribed stool softeners and suggested using a Fleet enema if needed. Please stay hydrated with electrolyte-rich fluids like sugar-free Gatorade or Liquid IV.  -HEADACHE WITH DIZZINESS AND NAUSEA, LIKELY SECONDARY TO CONSTIPATION: Your headache, dizziness, and nausea are likely due to constipation. Since there are no neurological issues, we recommend monitoring your symptoms and encouraging bowel movements. If your symptoms worsen or do not improve, please seek emergency care.  -HYPERTENSION: Hypertension means high blood pressure. Your blood pressure was slightly elevated today, which may be influenced by your current symptoms and stress. We have scheduled a follow-up appointment with Doctor Cox next Tuesday at 10:40 AM to discuss your blood pressure management.  -HEARING LOSS, LEFT GREATER THAN RIGHT, WITH CERUMEN IMPACTION LEFT EAR: Cerumen impaction means there is a buildup of earwax in your left ear, which is contributing to your hearing loss. We will perform earwax removal from your left ear.  INSTRUCTIONS:  Please follow up with Doctor Cox next Tuesday at 10:40 AM to discuss your blood pressure management. If your dizziness, nausea, or headache worsen or do not improve, seek emergency care.

## 2024-07-07 NOTE — Progress Notes (Signed)
 Acute Office Visit  Subjective:    Patient ID: [name], female    DOB: Dec 11, 1950, 73 y.o.   MRN: [id number]  Chief Complaint  Patient presents with   Dizziness/headache/nauseated    HPI: Patient is in today for headaches, abdominal bloating, and elevated blood pressure.   Discussed the use of AI scribe software for clinical note transcription with the patient, who gave verbal consent to proceed.  History of Present Illness   [name] is a 73 year old female who presents with dizziness, nausea, and headache. She is accompanied by her son, Leonce.  She experienced a sudden onset of dizziness, nausea, and a severe headache, described as similar to 'extreme seasickness.' These symptoms began a couple of hours prior to the visit while she was at home. She felt dizzy and nauseated when trying to walk and almost vomited but managed to stop herself. No vision changes, abnormal sounds, tastes, or weakness in her hands and feet. No chest pain.  She has a history of a small stroke diagnosed in August 2022 following a colonoscopy. She recalls feeling dehydrated and unwell after the procedure, which led to the discovery of the stroke via MRI. She has had a CT scan in 2023 due to a period of extreme drowsiness and confusion, but she does not recall this event clearly.  She has a history of gastrointestinal issues, including bloating and a sensation of fullness. She reports having normal bowel movements but feels that her stomach is not normal. She uses Miralax  and stool softeners to manage her symptoms and has experienced relief after a significant cleanout with Miralax , although the bloating returns quickly. She mentions a recent gastric emptying study in October 2023, which showed normal results.  Her current medications for blood pressure include propranolol , Diovan  (valsartan ), amlodipine , and Lasix . She confirms taking these medications regularly. She also takes supplements like  calcium  but spaces them out throughout the day. She has not taken anything for her current headache.  She has a history of osteoporosis and had a bone density scan earlier this year. She has had multiple surgeries, including shoulder replacement and elbow reconstruction following a fall.  She wears hearing aids in both ears but reports issues with the left one, which she plans to address at [employer]. She has had earwax removed in the past and currently has a significant amount in her left ear.       Past Medical History:  Diagnosis Date   Abdominal bloating 08/12/2022   Acquired hypothyroidism 12/23/2021   Acute respiratory failure with hypoxemia 04/20/2014   Asthma 08/14/2015   Ataxia 12/23/2021   Capsulitis of metatarsophalangeal (MTP) joint of left foot 01/22/2018   Cough due to ACE inhibitor 09/30/2021   Daytime somnolence 12/23/2021   Essential hypertension 04/15/2014   External hemorrhoid 11/04/2022   GERD (gastroesophageal reflux disease)    Hearing loss of left ear due to cerumen impaction 10/25/2021   Imbalance 07/11/2022   Lacunar infarction 01/10/2021   right dorsal pons   Left tibial fracture 08/03/2019   Major depressive disorder 01/21/2022   Mixed hyperlipidemia 02/17/2022   Nocturia 09/30/2021   Obstructive sleep apnea    No CPAP   Primary localized osteoarthrosis, lower leg 04/18/2014   Snoring 09/30/2021   Tremor 06/29/2022   Type 1 diabetes mellitus with hyperglycemia 09/30/2021   Urine WBC increased 07/11/2022    Past Surgical History:  Procedure Laterality Date   APPENDECTOMY     APPLICATION OF WOUND VAC Left  08/03/2019   Procedure: Application Of Wound Vac;  Surgeon: Celena Sharper, MD;  Location: [facility] OR;  Service: Orthopedics;  Laterality: Left;   broken elbow Left 08/2023   CESAREAN SECTION     ESOPHAGOGASTRODUODENOSCOPY (EGD) WITH PROPOFOL  N/A 02/02/2016   Procedure: ESOPHAGOGASTRODUODENOSCOPY (EGD) WITH PROPOFOL ;  Surgeon: Donnice Vaughn Manes, MD;   Location: [facility] ENDOSCOPY;  Service: Endoscopy;  Laterality: N/A;   HAND SURGERY     JOINT REPLACEMENT Bilateral    Knee    ORIF ANKLE FRACTURE Left 08/03/2019   with wound vac applied    ORIF ANKLE FRACTURE Left 08/03/2019   Procedure: OPEN REDUCTION INTERNAL FIXATION (ORIF) ANKLE FRACTURE;  Surgeon: Celena Sharper, MD;  Location: [facility] OR;  Service: Orthopedics;  Laterality: Left;   ORIF HUMERUS FRACTURE Left 08/14/2023   Procedure: OPEN REDUCTION INTERNAL FIXATION (ORIF) DISTAL HUMERUS FRACTURE WITH OLECRANON OSTEOTOMY;  Surgeon: Celena Sharper, MD;  Location: [facility] OR;  Service: Orthopedics;  Laterality: Left;   TIBIALIS TENDON TRANSFER / REPAIR     TOTAL SHOULDER REPLACEMENT      Family History  Problem Relation Age of Onset   Cancer Mother        Colon cancer    Dementia Father        mid 53s, unspecified type   Neuropathy Father    Schizophrenia Brother     Social History   Socioeconomic History   Marital status: Single    Spouse name: Not on file   Number of children: 2   Years of education: 16   Highest education level: Bachelor's degree (e.g., BA, AB, BS)  Occupational History   Occupation: Retired    Comment: Runner, broadcasting/film/video  Tobacco Use   Smoking status: Never   Smokeless tobacco: Never  Vaping Use   Vaping status: Never Used  Substance and Sexual Activity   Alcohol use: Not Currently   Drug use: Never   Sexual activity: Not Currently  Other Topics Concern   Not on file  Social History Narrative   Right hand   Lives alone   Drinks caffeine   An apartment building   Social Drivers of Health   Financial Resource Strain: Low Risk  (09/04/2023)   Overall Financial Resource Strain (CARDIA)    Difficulty of Paying Living Expenses: Not hard at all  Food Insecurity: No Food Insecurity (06/08/2024)   Hunger Vital Sign    Worried About Running Out of Food in the Last Year: Never true    Ran Out of Food in the Last Year: Never true  Transportation Needs: No  Transportation Needs (06/08/2024)   PRAPARE - Administrator, Civil Service (Medical): No    Lack of Transportation (Non-Medical): No  Physical Activity: Sufficiently Active (09/04/2023)   Exercise Vital Sign    Days of Exercise per Week: 7 days    Minutes of Exercise per Session: 30 min  Stress: No Stress Concern Present (09/04/2023)   [employer] of Occupational Health - Occupational Stress Questionnaire    Feeling of Stress : Not at all  Social Connections: Socially Isolated (09/04/2023)   Social Connection and Isolation Panel    Frequency of Communication with Friends and Family: Three times a week    Frequency of Social Gatherings with Friends and Family: Three times a week    Attends Religious Services: Never    Active Member of Clubs or Organizations: No    Attends Banker Meetings: Never    Marital Status: Divorced  Intimate  Partner Violence: Not At Risk (06/08/2024)   Humiliation, Afraid, Rape, and Kick questionnaire    Fear of Current or Ex-Partner: No    Emotionally Abused: No    Physically Abused: No    Sexually Abused: No    Outpatient Medications Prior to Visit  Medication Sig Dispense Refill   alendronate  (FOSAMAX ) 70 MG tablet Take 1 tablet (70 mg total) by mouth once a week.     amLODipine  (NORVASC ) 10 MG tablet TAKE 1 TABLET BY MOUTH EVERY DAY 90 tablet 0   aspirin  EC 81 MG tablet Take 81 mg by mouth daily.     calcium  carbonate (OSCAL) 1500 (600 Ca) MG TABS tablet Take 600 mg of elemental calcium  by mouth daily with breakfast.     cholecalciferol  (VITAMIN D3) 25 MCG (1000 UNIT) tablet Take 1,000 Units by mouth daily.     Continuous Glucose Receiver (DEXCOM G6 RECEIVER) DEVI E11.21 Use as directed 1 each 0   Continuous Glucose Sensor (DEXCOM G6 SENSOR) MISC E11.21 Change sensor every 10 days 3 each 2   docusate sodium  (COLACE) 100 MG capsule Take 1 capsule (100 mg total) by mouth 2 (two) times daily for 5 days then as directed by MD  100 capsule 0   ferrous sulfate 325 (65 FE) MG tablet Take 325 mg by mouth daily with breakfast.     furosemide  (LASIX ) 20 MG tablet Take 1 tablet (20 mg total) by mouth daily. 30 tablet 3   Glucagon  (GVOKE HYPOPEN  2-PACK) 0.5 MG/0.1ML SOAJ Inject 0.5 mg into the skin daily. As needed for low sugars less than 60 if does not respond oral sugar. 0.2 mL 2   ibuprofen (ADVIL) 600 MG tablet Take 600 mg by mouth every 6 (six) hours as needed.     insulin  degludec (TRESIBA  FLEXTOUCH) 100 UNIT/ML FlexTouch Pen Inject 19 Units into the skin at bedtime.     levothyroxine  (SYNTHROID ) 125 MCG tablet TAKE 1 TABLET BY MOUTH EVERY DAY BEFORE BREAKFAST 90 tablet 1   LORazepam  (ATIVAN ) 0.5 MG tablet TAKE 1 TABLET BY MOUTH EVERYDAY AT BEDTIME 30 tablet 2   omeprazole  (PRILOSEC) 40 MG capsule TAKE 1 CAPSULE BY MOUTH EVERY DAY 90 capsule 1   OVER THE COUNTER MEDICATION Take 1 tablet by mouth daily. Care 1 stool softener     polyethylene glycol powder (GLYCOLAX /MIRALAX ) 17 GM/SCOOP powder Take 0.5 Containers by mouth daily. 1 Scoop     propranolol  ER (INDERAL  LA) 60 MG 24 hr capsule TAKE 1 CAPSULE BY MOUTH EVERY DAY 90 capsule 1   rosuvastatin  (CRESTOR ) 40 MG tablet TAKE 1 TABLET BY MOUTH EVERY DAY 90 tablet 1   valsartan  (DIOVAN ) 320 MG tablet TAKE 1 TABLET BY MOUTH EVERY DAY 90 tablet 0   venlafaxine  XR (EFFEXOR -XR) 150 MG 24 hr capsule TAKE 1 CAPSULE BY MOUTH EVERY DAY 90 capsule 1   vitamin B-12 (CYANOCOBALAMIN) 100 MCG tablet Take 100 mcg by mouth daily.     insulin  aspart (NOVOLOG ) 100 UNIT/ML injection Inject 2-3 Units into the skin 3 (three) times daily before meals. Sliding scale Start with 3 units, for 15 carb's add and additional unit     No facility-administered medications prior to visit.    Allergies  Allergen Reactions   Lisinopril Cough   Zetia  [Ezetimibe ]     CONFUSION    Review of Systems  Constitutional:  Negative for appetite change, fatigue and fever.  HENT:  Negative for congestion,  ear pain, sinus pressure and sore throat.  Respiratory:  Negative for cough, chest tightness, shortness of breath and wheezing.   Cardiovascular:  Negative for chest pain and palpitations.  Gastrointestinal:  Negative for abdominal pain, constipation, diarrhea, nausea and vomiting.  Genitourinary:  Negative for dysuria and hematuria.  Musculoskeletal:  Negative for arthralgias, back pain, joint swelling and myalgias.  Skin:  Negative for rash.  Neurological:  Positive for dizziness and headaches. Negative for weakness.  Psychiatric/Behavioral:  Negative for dysphoric mood. The patient is not nervous/anxious.        Objective:        07/07/2024   11:40 AM 06/22/2024    1:42 PM 06/08/2024   10:59 AM  Vitals with BMI  Height 5' 4 5' 4 5' 4  Weight 173 lbs 176 lbs 174 lbs  BMI 29.68 30.2 29.85  Systolic 158 138 877  Diastolic 82 78 72  Pulse 67 73 76    Orthostatic VS for the past 72 hrs (Last 3 readings):  Patient Position BP Location  07/07/24 1140 Sitting Left Arm     Physical Exam Vitals reviewed.  Constitutional:      Appearance: Normal appearance.  HENT:     Right Ear: Tympanic membrane and ear canal normal. Decreased hearing noted.     Left Ear: Tympanic membrane normal. Decreased hearing noted. There is impacted cerumen.  Cardiovascular:     Rate and Rhythm: Normal rate and regular rhythm.     Heart sounds: Normal heart sounds.  Pulmonary:     Effort: Pulmonary effort is normal.     Breath sounds: Normal breath sounds.  Abdominal:     General: Bowel sounds are increased.     Palpations: Abdomen is soft.     Tenderness: There is abdominal tenderness in the right lower quadrant. There is no right CVA tenderness, left CVA tenderness, guarding or rebound. Negative signs include Murphy's sign and McBurney's sign.  Neurological:     General: No focal deficit present.     Mental Status: She is alert and oriented to person, place, and time.  Psychiatric:        Mood  and Affect: Mood normal.        Behavior: Behavior normal.     Health Maintenance Due  Topic Date Due   Medicare Annual Wellness (AWV)  Never done   COVID-19 Vaccine (8 - 2024-25 season) 09/12/2023   OPHTHALMOLOGY EXAM  01/30/2024   HEMOGLOBIN A1C  06/02/2024   INFLUENZA VACCINE  06/18/2024    There are no preventive care reminders to display for this patient.   Lab Results  Component Value Date   TSH 4.500 06/08/2024   Lab Results  Component Value Date   WBC 6.1 06/08/2024   HGB 12.7 06/08/2024   HCT 39.0 06/08/2024   MCV 97 06/08/2024   PLT 284 06/08/2024   Lab Results  Component Value Date   NA 137 06/08/2024   K 4.1 06/08/2024   CO2 23 06/08/2024   GLUCOSE 132 (H) 06/08/2024   BUN 19 06/08/2024   CREATININE 0.76 06/08/2024   BILITOT 0.2 06/08/2024   ALKPHOS 150 (H) 06/08/2024   AST 19 06/08/2024   ALT 18 06/08/2024   PROT 6.4 06/08/2024   ALBUMIN 4.3 06/08/2024   CALCIUM  9.1 06/08/2024   ANIONGAP 10 08/15/2023   EGFR 82 06/08/2024   Lab Results  Component Value Date   CHOL 188 04/05/2024   Lab Results  Component Value Date   HDL 84 04/05/2024   Lab Results  Component Value Date   LDLCALC 89 04/05/2024   Lab Results  Component Value Date   TRIG 85 04/05/2024   Lab Results  Component Value Date   CHOLHDL 2.2 04/05/2024   Lab Results  Component Value Date   HGBA1C 9.3 12/04/2023       Assessment & Plan:  There are no diagnoses linked to this encounter.  Assessment and Plan    Constipation with abdominal bloating and stool burden Moderate stool burden on the right abdomen with gas on the left. No obstruction. Symptoms may contribute to nausea and dizziness. - Administer Miralax  today and continue for a few days. - Prescribe stool softeners. - Consider Fleet enema if needed. - Encourage hydration with electrolyte-rich fluids like sugar-free Gatorade or Liquid IV.  Headache with dizziness and nausea, likely secondary to  constipation Headache, dizziness, and nausea likely secondary to constipation. No neurological deficits. Symptoms improved with bowel movement. - Monitor symptoms and encourage bowel movement. - Advise to seek emergency care if symptoms worsen or persist.  Hypertension Blood pressure elevated at 136/86 mmHg. On multiple antihypertensives. Blood pressure may be influenced by current symptoms and stress. - Schedule follow-up with Doctor Cox next Tuesday at 10:40 AM to discuss blood pressure management.  Hearing loss, left greater than right, with cerumen impaction left ear Cerumen impaction in the left ear contributing to hearing loss. - Perform cerumen removal from the left ear.  Recording duration: 31 minutes       No orders of the defined types were placed in this encounter.   No orders of the defined types were placed in this encounter.    Follow-up: No follow-ups on file.  An After Visit Summary was printed and given to the patient.   I,Lauren M Auman,acting as a Neurosurgeon for [employer], PA.,have documented all relevant documentation on the behalf of Nola Angles, PA,as directed by  Nola Angles, PA while in the presence of Nola Angles, [place].    Nola Angles, [place] [facility] [phone]

## 2024-07-07 NOTE — Telephone Encounter (Signed)
 Called patient left message to call back.  Copied from CRM [id number]. Topic: Clinical - Lab/Test Results >> Jul 01, 2024  9:36 AM Gustabo D wrote: Go over lab results >> Jul 07, 2024 10:11 AM Winona R wrote: Pt calling to find out her results please give her a call as she has concerns

## 2024-07-08 ENCOUNTER — Ambulatory Visit: Payer: Self-pay | Admitting: Physician Assistant

## 2024-07-10 DIAGNOSIS — R519 Headache, unspecified: Secondary | ICD-10-CM | POA: Insufficient documentation

## 2024-07-10 DIAGNOSIS — K5909 Other constipation: Secondary | ICD-10-CM | POA: Insufficient documentation

## 2024-07-10 NOTE — Assessment & Plan Note (Signed)
 Moderate stool burden on the right abdomen with gas on the left. No obstruction. Symptoms may contribute to nausea and dizziness. - Administer Miralax  today and continue for a few days. - Prescribe stool softeners. - Consider Fleet enema if needed. - Encourage hydration with electrolyte-rich fluids like sugar-free Gatorade or Liquid IV.

## 2024-07-13 ENCOUNTER — Encounter: Payer: Self-pay | Admitting: Family Medicine

## 2024-07-13 ENCOUNTER — Ambulatory Visit (INDEPENDENT_AMBULATORY_CARE_PROVIDER_SITE_OTHER): Admitting: Family Medicine

## 2024-07-13 VITALS — BP 126/70 | HR 78 | Temp 98.0°F | Ht 64.0 in | Wt 176.0 lb

## 2024-07-13 DIAGNOSIS — R270 Ataxia, unspecified: Secondary | ICD-10-CM

## 2024-07-13 DIAGNOSIS — K5909 Other constipation: Secondary | ICD-10-CM | POA: Diagnosis not present

## 2024-07-13 DIAGNOSIS — R140 Abdominal distension (gaseous): Secondary | ICD-10-CM

## 2024-07-13 DIAGNOSIS — R600 Localized edema: Secondary | ICD-10-CM

## 2024-07-13 NOTE — Progress Notes (Unsigned)
 Subjective:  Patient ID: [name], female    DOB: 1951/02/02  Age: 73 y.o. MRN: [id number]  Chief Complaint  Patient presents with   Medical Management of Chronic Issues    HPI: Discussed the use of AI scribe software for clinical note transcription with the patient, who gave verbal consent to proceed.  History of Present Illness  [name] is a 73 year old female who presents with dizziness, nausea, and bloating.  Dizziness and nausea - Has episodes of dizziness and nausea with some headaches. Similar symptoms to 2022 when she was found to have a pontine stroke.    - Similar episode occurred a couple of months ago, lasting three to four days, with dizziness upon sitting down. - Two additional mild episodes since then. - Once called paramedics fearing a stroke but declined transport to the ER. - history of pontine stroke in 2022.  - No current chest pain, shortness of breath, or palpitations.  Abdominal bloating and weight gain - Bloating and unexplained weight gain. - Abdominal fullness after eating, resulting in early satiety and difficulty eating more. - Abdominal profile appears distended, described as 'pregnant' and 'definitely bigger' than before. - No abdominal cramping, pain, nausea, or vomiting associated with bloating. - History of small hiatal hernia and gastric polyps found during endoscopy in 2022.  Altered bowel habits and constipation - Uses Miralax , adding a few capfuls to her water bottle, with some improvement. - Continues to have small bowel movements and does not feel fully evacuated. - Has not tried enemas but is open to it. - Family history of colon cancer, requiring regular colonoscopies.  Peripheral edema - Ankle swelling, more pronounced in the right ankle by the end of the day. - Swelling improves with compression socks and lying down. - No pain associated with the swelling.  Cognitive concerns - Past consultation with a neurologist  due to her son's concerns about cognitive problems. - Denies any cognitive symptoms.          07/13/2024   10:32 AM 04/05/2024   10:34 AM 09/30/2023   10:30 AM 09/04/2023    3:21 PM 05/27/2023   11:02 AM  Depression screen PHQ 2/9  Decreased Interest 0 0 0 0 0  Down, Depressed, Hopeless 0 0 0 0 0  PHQ - 2 Score 0 0 0 0 0  Altered sleeping 0 0 0 0 0  Tired, decreased energy 0 0 0 0 0  Change in appetite 0 0 0 0 0  Feeling bad or failure about yourself  0 0 0 0 0  Trouble concentrating 0 0 0 0 0  Moving slowly or fidgety/restless 0 0 0 0 0  Suicidal thoughts 0 0 0 0 0  PHQ-9 Score 0 0 0 0 0  Difficult doing work/chores Not difficult at all  Not difficult at all Not difficult at all Not difficult at all        07/13/2024   10:31 AM  Fall Risk   Falls in the past year? 0  Number falls in past yr: 0  Injury with Fall? 0  Risk for fall due to : No Fall Risks  Follow up Falls evaluation completed    Patient Care Team: Sherre Clapper, MD as PCP - General (Family Medicine) Monetta Redell PARAS, MD as PCP - Cardiology (Cardiology) Georjean Darice HERO, MD as Consulting Physician (Neurology) Solum, Therisa HERO, MD as Physician Assistant (Endocrinology) Burt Fus, DPM as Referring Physician (Podiatry) Russell, Paula E, MD  as Consulting Physician (Ophthalmology)   Review of Systems  Constitutional:  Negative for chills, fatigue and fever.  HENT:  Negative for congestion, ear pain and sore throat.   Respiratory:  Negative for cough and shortness of breath.   Cardiovascular:  Negative for chest pain.  Gastrointestinal:  Positive for abdominal distention. Negative for abdominal pain, constipation, diarrhea, nausea and vomiting.  Genitourinary:  Negative for dysuria and urgency.  Musculoskeletal:  Negative for arthralgias and myalgias.  Skin:  Negative for rash.  Neurological:  Negative for dizziness and headaches.  Psychiatric/Behavioral:  Negative for dysphoric mood. The patient is not  nervous/anxious.     Current Outpatient Medications on File Prior to Visit  Medication Sig Dispense Refill   alendronate  (FOSAMAX ) 70 MG tablet Take 1 tablet (70 mg total) by mouth once a week.     amLODipine  (NORVASC ) 10 MG tablet TAKE 1 TABLET BY MOUTH EVERY DAY 90 tablet 0   aspirin  EC 81 MG tablet Take 81 mg by mouth daily.     calcium  carbonate (OSCAL) 1500 (600 Ca) MG TABS tablet Take 600 mg of elemental calcium  by mouth daily with breakfast.     cholecalciferol  (VITAMIN D3) 25 MCG (1000 UNIT) tablet Take 1,000 Units by mouth daily.     Continuous Glucose Receiver (DEXCOM G6 RECEIVER) DEVI E11.21 Use as directed 1 each 0   Continuous Glucose Sensor (DEXCOM G6 SENSOR) MISC E11.21 Change sensor every 10 days 3 each 2   docusate sodium  (COLACE) 100 MG capsule Take 1 capsule (100 mg total) by mouth 2 (two) times daily for 5 days then as directed by MD 100 capsule 0   ferrous sulfate 325 (65 FE) MG tablet Take 325 mg by mouth daily with breakfast.     furosemide  (LASIX ) 20 MG tablet Take 1 tablet (20 mg total) by mouth daily. 30 tablet 3   Glucagon  (GVOKE HYPOPEN  2-PACK) 0.5 MG/0.1ML SOAJ Inject 0.5 mg into the skin daily. As needed for low sugars less than 60 if does not respond oral sugar. 0.2 mL 2   ibuprofen (ADVIL) 600 MG tablet Take 600 mg by mouth every 6 (six) hours as needed.     insulin  aspart (NOVOLOG ) 100 UNIT/ML injection Inject 2-3 Units into the skin 3 (three) times daily before meals. Sliding scale Start with 3 units, for 15 carb's add and additional unit     insulin  degludec (TRESIBA  FLEXTOUCH) 100 UNIT/ML FlexTouch Pen Inject 19 Units into the skin at bedtime.     LORazepam  (ATIVAN ) 0.5 MG tablet TAKE 1 TABLET BY MOUTH EVERYDAY AT BEDTIME 30 tablet 2   omeprazole  (PRILOSEC) 40 MG capsule TAKE 1 CAPSULE BY MOUTH EVERY DAY 90 capsule 1   OVER THE COUNTER MEDICATION Take 1 tablet by mouth daily. Care 1 stool softener     polyethylene glycol powder (GLYCOLAX /MIRALAX ) 17 GM/SCOOP  powder Take 0.5 Containers by mouth daily. 1 Scoop     propranolol  ER (INDERAL  LA) 60 MG 24 hr capsule TAKE 1 CAPSULE BY MOUTH EVERY DAY 90 capsule 1   rosuvastatin  (CRESTOR ) 40 MG tablet TAKE 1 TABLET BY MOUTH EVERY DAY 90 tablet 1   valsartan  (DIOVAN ) 320 MG tablet TAKE 1 TABLET BY MOUTH EVERY DAY 90 tablet 0   venlafaxine  XR (EFFEXOR -XR) 150 MG 24 hr capsule TAKE 1 CAPSULE BY MOUTH EVERY DAY 90 capsule 1   vitamin B-12 (CYANOCOBALAMIN) 100 MCG tablet Take 100 mcg by mouth daily.     No current facility-administered medications on  file prior to visit.   Past Medical History:  Diagnosis Date   Abdominal bloating 08/12/2022   Acquired hypothyroidism 12/23/2021   Acute respiratory failure with hypoxemia 04/20/2014   Asthma 08/14/2015   Ataxia 12/23/2021   Capsulitis of metatarsophalangeal (MTP) joint of left foot 01/22/2018   Cough due to ACE inhibitor 09/30/2021   Daytime somnolence 12/23/2021   Essential hypertension 04/15/2014   External hemorrhoid 11/04/2022   GERD (gastroesophageal reflux disease)    Hearing loss of left ear due to cerumen impaction 10/25/2021   Imbalance 07/11/2022   Lacunar infarction 01/10/2021   right dorsal pons   Left tibial fracture 08/03/2019   Major depressive disorder 01/21/2022   Mixed hyperlipidemia 02/17/2022   Nocturia 09/30/2021   Obstructive sleep apnea    No CPAP   Primary localized osteoarthrosis, lower leg 04/18/2014   Snoring 09/30/2021   Tremor 06/29/2022   Type 1 diabetes mellitus with hyperglycemia 09/30/2021   Urine WBC increased 07/11/2022   Past Surgical History:  Procedure Laterality Date   APPENDECTOMY     APPLICATION OF WOUND VAC Left 08/03/2019   Procedure: Application Of Wound Vac;  Surgeon: Celena Sharper, MD;  Location: [facility] OR;  Service: Orthopedics;  Laterality: Left;   broken elbow Left 08/2023   CESAREAN SECTION     ESOPHAGOGASTRODUODENOSCOPY (EGD) WITH PROPOFOL  N/A 02/02/2016   Procedure: ESOPHAGOGASTRODUODENOSCOPY  (EGD) WITH PROPOFOL ;  Surgeon: Donnice Vaughn Manes, MD;  Location: [facility] ENDOSCOPY;  Service: Endoscopy;  Laterality: N/A;   HAND SURGERY     JOINT REPLACEMENT Bilateral    Knee    ORIF ANKLE FRACTURE Left 08/03/2019   with wound vac applied    ORIF ANKLE FRACTURE Left 08/03/2019   Procedure: OPEN REDUCTION INTERNAL FIXATION (ORIF) ANKLE FRACTURE;  Surgeon: Celena Sharper, MD;  Location: [facility] OR;  Service: Orthopedics;  Laterality: Left;   ORIF HUMERUS FRACTURE Left 08/14/2023   Procedure: OPEN REDUCTION INTERNAL FIXATION (ORIF) DISTAL HUMERUS FRACTURE WITH OLECRANON OSTEOTOMY;  Surgeon: Celena Sharper, MD;  Location: [facility] OR;  Service: Orthopedics;  Laterality: Left;   TIBIALIS TENDON TRANSFER / REPAIR     TOTAL SHOULDER REPLACEMENT      Family History  Problem Relation Age of Onset   Cancer Mother        Colon cancer    Dementia Father        mid 5s, unspecified type   Neuropathy Father    Schizophrenia Brother    Social History   Socioeconomic History   Marital status: Single    Spouse name: Not on file   Number of children: 2   Years of education: 16   Highest education level: Bachelor's degree (e.g., BA, AB, BS)  Occupational History   Occupation: Retired    Comment: Runner, broadcasting/film/video  Tobacco Use   Smoking status: Never   Smokeless tobacco: Never  Vaping Use   Vaping status: Never Used  Substance and Sexual Activity   Alcohol use: Not Currently   Drug use: Never   Sexual activity: Not Currently  Other Topics Concern   Not on file  Social History Narrative   Right hand   Lives alone   Drinks caffeine   An apartment building   Social Drivers of Health   Financial Resource Strain: Low Risk  (09/04/2023)   Overall Financial Resource Strain (CARDIA)    Difficulty of Paying Living Expenses: Not hard at all  Food Insecurity: No Food Insecurity (06/08/2024)   Hunger Vital Sign    Worried About  Running Out of Food in the Last Year: Never true    Ran Out of Food in the Last  Year: Never true  Transportation Needs: No Transportation Needs (06/08/2024)   PRAPARE - Administrator, Civil Service (Medical): No    Lack of Transportation (Non-Medical): No  Physical Activity: Sufficiently Active (09/04/2023)   Exercise Vital Sign    Days of Exercise per Week: 7 days    Minutes of Exercise per Session: 30 min  Stress: No Stress Concern Present (09/04/2023)   [employer] of Occupational Health - Occupational Stress Questionnaire    Feeling of Stress : Not at all  Social Connections: Socially Isolated (07/13/2024)   Social Connection and Isolation Panel    Frequency of Communication with Friends and Family: Three times a week    Frequency of Social Gatherings with Friends and Family: Three times a week    Attends Religious Services: Never    Active Member of Clubs or Organizations: No    Attends Banker Meetings: Never    Marital Status: Divorced    Objective:  BP 126/70   Pulse 78   Temp 98 F (36.7 C)   Ht 5' 4 (1.626 m)   Wt 176 lb (79.8 kg)   SpO2 96%   BMI 30.21 kg/m      07/13/2024   10:30 AM 07/07/2024   11:40 AM 06/22/2024    1:42 PM  BP/Weight  Systolic BP 126 158 138  Diastolic BP 70 82 78  Wt. (Lbs) 176 173 176  BMI 30.21 kg/m2 29.7 kg/m2 30.21 kg/m2    Physical Exam Vitals reviewed.  Constitutional:      Appearance: Normal appearance. She is normal weight.  Neck:     Vascular: No carotid bruit.  Cardiovascular:     Rate and Rhythm: Normal rate and regular rhythm.     Heart sounds: Normal heart sounds.  Pulmonary:     Effort: Pulmonary effort is normal. No respiratory distress.     Breath sounds: Normal breath sounds.  Abdominal:     General: Abdomen is flat. Bowel sounds are normal. There is distension.     Palpations: Abdomen is soft.     Tenderness: There is no abdominal tenderness.  Neurological:     Mental Status: She is alert and oriented to person, place, and time.  Psychiatric:        Mood  and Affect: Mood normal.        Behavior: Behavior normal.     {Perform Simple Foot Exam  Perform Detailed exam:1} {Insert foot Exam (Optional):30965}   Lab Results  Component Value Date   WBC 6.1 06/08/2024   HGB 12.7 06/08/2024   HCT 39.0 06/08/2024   PLT 284 06/08/2024   GLUCOSE 132 (H) 06/08/2024   CHOL 188 04/05/2024   TRIG 85 04/05/2024   HDL 84 04/05/2024   LDLCALC 89 04/05/2024   ALT 18 06/08/2024   AST 19 06/08/2024   NA 137 06/08/2024   K 4.1 06/08/2024   CL 101 06/08/2024   CREATININE 0.76 06/08/2024   BUN 19 06/08/2024   CO2 23 06/08/2024   TSH 4.500 06/08/2024   INR 0.9 08/14/2023   HGBA1C 9.3 12/04/2023      Assessment & Plan:  Other constipation Assessment & Plan: Start on linzess  145 mg once daily.  Continue holding miralax  if linzess  becomes too effective. I.e. it causes diarrhea  Chronic constipation with incomplete evacuation and bloating. Unexplained weight gain  noted. Previous imaging showed large stool burden. Gastroparesis not confirmed. Endoscopy revealed small hiatal hernia and gastric polyps. Consider referral to [facility] for further evaluation. - Order CT scan of abdomen. - Provide Linzess  samples. - Refer to [facility] for further evaluation if needed.  Orders: -     Ambulatory referral to Gastroenterology  Abdominal distension Assessment & Plan: Start on linzess  145 mg once daily.  Continue holding miralax  if linzess  becomes too effective. I.e. it causes diarrhea  Chronic constipation with incomplete evacuation and bloating. Unexplained weight gain noted. Previous imaging showed large stool burden. Gastroparesis not confirmed. Endoscopy revealed small hiatal hernia and gastric polyps. Consider referral to [facility] for further evaluation. - Order CT scan of abdomen. - Provide Linzess  samples. - Refer to [facility] for further evaluation if needed.  Orders: -     Ambulatory referral to Gastroenterology  Ataxia Assessment & Plan: Ordering  MRI   Pedal edema Assessment & Plan: Bilateral lower extremity edema, right worse than left. Swelling resolves with compression and elevation. Managed with diuretics and compression therapy. - Continue diuretic therapy.    No orders of the defined types were placed in this encounter.   Orders Placed This Encounter  Procedures   Ambulatory referral to Gastroenterology     Follow-up: No follow-ups on file.   I,Ramiel Forti,acting as a Neurosurgeon for Abigail Free, MD.,have documented all relevant documentation on the behalf of Abigail Free, MD,as directed by  Abigail Free, MD while in the presence of Abigail Free, MD.   LILLETTE Kato I Leal-Borjas,acting as a scribe for Abigail Free, MD.,have documented all relevant documentation on the behalf of Abigail Free, MD,as directed by  Abigail Free, MD while in the presence of Abigail Free, MD.    An After Visit Summary was printed and given to the patient.  Abigail Free, MD [facility] [phone]

## 2024-07-13 NOTE — Assessment & Plan Note (Addendum)
 Start on linzess  145 mg once daily.  Continue holding miralax  if linzess  becomes too effective. I.e. it causes diarrhea  Chronic constipation with incomplete evacuation and bloating. Unexplained weight gain noted. Previous imaging showed large stool burden. Gastroparesis not confirmed. Endoscopy revealed small hiatal hernia and gastric polyps. Consider referral to [facility] for further evaluation. - Order CT scan of abdomen. - Provide Linzess  samples. - Refer to [facility] for further evaluation if needed.

## 2024-07-13 NOTE — Patient Instructions (Signed)
 Start on linzess 145 mg once daily.  Continue holding miralax  if linzess becomes too effective. I.e. it causes diarrhea Call and let us  know how you are doing. Dr. Sherre

## 2024-07-15 ENCOUNTER — Ambulatory Visit: Admitting: Family Medicine

## 2024-07-15 ENCOUNTER — Other Ambulatory Visit: Payer: Self-pay | Admitting: Family Medicine

## 2024-07-17 ENCOUNTER — Other Ambulatory Visit: Payer: Self-pay | Admitting: Family Medicine

## 2024-07-17 NOTE — Assessment & Plan Note (Signed)
 Bilateral lower extremity edema, right worse than left. Swelling resolves with compression and elevation. Managed with diuretics and compression therapy. - Continue diuretic therapy.

## 2024-07-17 NOTE — Assessment & Plan Note (Deleted)
[ ]

## 2024-07-17 NOTE — Assessment & Plan Note (Signed)
Ordering MRI ?

## 2024-07-21 ENCOUNTER — Other Ambulatory Visit: Payer: Self-pay | Admitting: Family Medicine

## 2024-07-21 ENCOUNTER — Telehealth: Payer: Self-pay

## 2024-07-21 MED ORDER — LINACLOTIDE 145 MCG PO CAPS: 145.0000 ug | ORAL_CAPSULE | Freq: Every day | ORAL | 1 refills | Status: AC

## 2024-07-21 NOTE — Telephone Encounter (Signed)
 SENT LINZESS  145 MG ONCE DAILY. DR. SHERRE

## 2024-07-21 NOTE — Telephone Encounter (Signed)
 Copied from CRM #[id number]. Topic: Clinical - Medication Question >> Jul 21, 2024 11:12 AM Delon DASEN wrote: Reason for CRM: The Linzess  is working well and would like prescription, almost out of samples- [phone]

## 2024-07-30 ENCOUNTER — Other Ambulatory Visit

## 2024-07-30 DIAGNOSIS — R600 Localized edema: Secondary | ICD-10-CM

## 2024-07-30 LAB — COMPREHENSIVE METABOLIC PANEL WITH GFR
ALT: 16 IU/L (ref 0–32)
AST: 18 IU/L (ref 0–40)
Albumin: 4.3 g/dL (ref 3.8–4.8)
Alkaline Phosphatase: 123 IU/L — ABNORMAL HIGH (ref 44–121)
BUN/Creatinine Ratio: 15 (ref 12–28)
BUN: 13 mg/dL (ref 8–27)
Bilirubin Total: 0.3 mg/dL (ref 0.0–1.2)
CO2: 24 mmol/L (ref 20–29)
Calcium: 9.5 mg/dL (ref 8.7–10.3)
Chloride: 100 mmol/L (ref 96–106)
Creatinine, Ser: 0.85 mg/dL (ref 0.57–1.00)
Globulin, Total: 2.2 g/dL (ref 1.5–4.5)
Glucose: 121 mg/dL — ABNORMAL HIGH (ref 70–99)
Potassium: 5.1 mmol/L (ref 3.5–5.2)
Sodium: 140 mmol/L (ref 134–144)
Total Protein: 6.5 g/dL (ref 6.0–8.5)
eGFR: 72 mL/min/1.73 (ref >59)

## 2024-08-03 ENCOUNTER — Other Ambulatory Visit

## 2024-08-12 ENCOUNTER — Inpatient Hospital Stay (HOSPITAL_BASED_OUTPATIENT_CLINIC_OR_DEPARTMENT_OTHER): Admission: RE | Admit: 2024-08-12 | Source: Ambulatory Visit | Admitting: Radiology

## 2024-08-13 ENCOUNTER — Telehealth: Payer: Self-pay | Admitting: Neurology

## 2024-08-13 NOTE — Telephone Encounter (Signed)
 Patient states that she wanted to let Dr Georjean  know that she is having some dizzy spells and mentioned to the PCP. They have ordered a MRI and will be done this afternoon

## 2024-08-13 NOTE — Telephone Encounter (Signed)
 fyi

## 2024-08-18 ENCOUNTER — Telehealth: Payer: Self-pay | Admitting: Family Medicine

## 2024-08-18 NOTE — Telephone Encounter (Signed)
 Copied from CRM #[id number]. Topic: Clinical - Medication Refill >> Aug 18, 2024  5:41 PM DeAngela L wrote: Medication: insulin  aspart (NOVOLOG ) 100 UNIT/ML injection   Has the patient contacted their pharmacy? Yes  (Agent: If no, request that the patient contact the pharmacy for the refill. If patient does not wish to contact the pharmacy document the reason why and proceed with request.) (Agent: If yes, when and what did the pharmacy advise?)  This is the patient's preferred pharmacy:  [facility] [address], [place] - [address] [place] [address] Phone: [phone] Fax: [phone] Is this the correct pharmacy for this prescription? Yes  If no, delete pharmacy and type the correct one.   Has the prescription been filled recently? Yes   Is the patient out of the medication? No   Has the patient been seen for an appointment in the last year OR does the patient have an upcoming appointment? Yes   Can we respond through MyChart? No   Agent: Please be advised that Rx refills may take up to 3 business days. We ask that you follow-up with your pharmacy.

## 2024-08-18 NOTE — Telephone Encounter (Signed)
 Patient requesting medication attached be sent to pharmacy attached. When attempting to pend, receiving error stating medication cannot be reordered. Please advise.  Copied from CRM #[id number]. Topic: Clinical - Medication Refill >> Aug 18, 2024  5:41 PM DeAngela L wrote: Medication: insulin  aspart (NOVOLOG ) 100 UNIT/ML injection     Has the patient contacted their pharmacy? Yes  (Agent: If no, request that the patient contact the pharmacy for the refill. If patient does not wish to contact the pharmacy document the reason why and proceed with request.) (Agent: If yes, when and what did the pharmacy advise?)   This is the patient's preferred pharmacy:  [facility] [address], [place] - [address]  [place] [address] Phone: [phone] Fax: [phone] Is this the correct pharmacy for this prescription? Yes  If no, delete pharmacy and type the correct one.

## 2024-08-19 ENCOUNTER — Inpatient Hospital Stay (HOSPITAL_BASED_OUTPATIENT_CLINIC_OR_DEPARTMENT_OTHER): Admission: RE | Admit: 2024-08-19 | Discharge: 2024-08-19 | Attending: Family Medicine | Admitting: Family Medicine

## 2024-08-19 ENCOUNTER — Other Ambulatory Visit: Payer: Self-pay

## 2024-08-19 DIAGNOSIS — R270 Ataxia, unspecified: Secondary | ICD-10-CM

## 2024-08-22 ENCOUNTER — Other Ambulatory Visit: Payer: Self-pay | Admitting: Family Medicine

## 2024-08-22 DIAGNOSIS — R251 Tremor, unspecified: Secondary | ICD-10-CM

## 2024-08-22 MED ORDER — NOVOLOG FLEXPEN 100 UNIT/ML ~~LOC~~ SOPN: PEN_INJECTOR | SUBCUTANEOUS | 1 refills | Status: DC

## 2024-08-23 ENCOUNTER — Other Ambulatory Visit: Payer: Self-pay | Admitting: Family Medicine

## 2024-08-23 ENCOUNTER — Telehealth: Payer: Self-pay | Admitting: Neurology

## 2024-08-23 NOTE — Telephone Encounter (Signed)
 Pt is scheduled Oct 7 to Dr. Tollie.

## 2024-08-23 NOTE — Telephone Encounter (Signed)
 Pt called today and stated that she had a dizziness   spell last week, so her PCP order her to have a Brain MRI and that has been done. Pt did get the results and she would like to discuss the results. Thanks

## 2024-08-23 NOTE — Telephone Encounter (Signed)
 Sent. Dr. Sherre

## 2024-08-23 NOTE — Telephone Encounter (Signed)
 Pt requesting for the MRI brain results. Please advise

## 2024-08-23 NOTE — Telephone Encounter (Signed)
 Pls let her know that since these are new symptoms, we will need to do a visit to discuss the dizziness, I will show her the brain MRI at the visit. I have openings tomorrow 10/7 at 8:30am or 4pm if she can come in those times. Thanks

## 2024-08-24 ENCOUNTER — Ambulatory Visit: Payer: Self-pay | Admitting: Neurology

## 2024-08-24 ENCOUNTER — Encounter: Payer: Self-pay | Admitting: Neurology

## 2024-08-24 VITALS — Ht 63.0 in | Wt 178.0 lb

## 2024-08-24 DIAGNOSIS — H8190 Unspecified disorder of vestibular function, unspecified ear: Secondary | ICD-10-CM

## 2024-08-24 NOTE — Patient Instructions (Signed)
 Good to see you doing better. Your exam is normal. Continue control of blood sugar, cholesterol, blood pressure. Continue daily aspirin . Follow-up as needed, call for any changes

## 2024-08-24 NOTE — Progress Notes (Signed)
 NEUROLOGY FOLLOW UP OFFICE NOTE  [name] [id number] 07/01/1951  HISTORY OF PRESENT ILLNESS: I had the pleasure of seeing [name] in follow-up in the neurology clinic on 08/24/2024.  The patient was last seen 9 months ago. She is alone in the office today. Records and images were personally reviewed where available.  She presents for new symptoms of dizziness, headache, and nausea reported to PCP on 07/13/24. She reports symptoms started suddenly, she felt like she was carsick and seasick with nausea. She felt better when lying down. She would be sitting reading and feel the dizziness. She does not think it affected her vision, no focal numbness/tingling/weakness, dysarthria/dysphagia. She does not have a history of headaches but had it when she felt seasick. No tinnitus. She has hearing loss and wears hearing aids. Symptoms lasted an hour, if she tried to stand, symptoms would worsen. Since then, she still feels slightly dizzy, a little off balance. No recent head injuries or infection. Her ankles are occasionally swollen, worse on the right. She sleeps well. She reports glucose levels were elevated for 3 days despite insulin  dosing, Endo adjusted her pump, then glucose levels normalized. She reports her left foot turns when walking, she saw Podiatry who recommended a brace, she is not sure it helps. Her Hokas seem to work better. BP today drops from supine to standing (152/84 to 132/88). She had a BP monitor at home but was told it was worthless, she was checking it at the local pharmacy 6 months ago and BP was normal.   I personally reviewed brain MRI without contrast done 08/22/24 which did not show any acute changes, there was mild chronic microvascular disease, chronic microhemorrhage in the right temporal lobe.  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 She started noticing changes last school year, she was told by co-workers that she was asking the same questions. Her son has anxiety and has been concerned because she comes down to the kitchen and does not see him to her side. He thinks there is something wrong. She has occasional word-finding difficulties. She denies getting lost driving, no missed bills or medications. She denies any difficulties with ADLs. She drinks 1-2 glasses of wine every night for the past 8-10 years. She denies any head injuries. She reports they were told her father has dementia, but they are unsure if true. Her sister had strokes with some cognitive changes. She has a history of anxiety and depression, reporting rare anxiety. She denies any worsening stress, but that she had to learn a lot of new things for the new school year. She denies any headaches, dizziness, diplopia, dysarthria, dysphagia, neck/back pain, focal numbness/tingling/weakness, bowel/bladder dysfunction. No anosmia, tremors, no falls.    TSH and B12 done at PCP office were normal. She had an MRI brain with and without contrast done 04/05/16 which was reported as unremarkable, mild chronic small vessel disease and atrophy, no acute changes. Images unavailable for review.   Update 01/22/2021: She presents for an earlier visit today after recent hospitalization on 01/10/21 for stroke. She presented to [facility] for generalized malaise. She had a colonoscopy 3 weeks prior and while doing the prep, she began having abdominal cramping, headaches, generalized aches, which did not resolve after the procedure. She feels lightheaded when standing. She was found to have orthostatic hypotension and Lisinopril was stopped. CT head and CTA  angiogram no acute changes, there was some narrowing of the V4 segment of the right vertebral artery. MRI brain showed a subacute small vessel infarct in the right dorsal pons. Echocardiogram no significant abnormalities. She  was started on aspirin  and Plavix , then after 2 weeks stop Plavix . LDL was 113, rosuvastatin  dose increased to 20mg  daily. HbA1c was 9. She was evaluated by PT and OT with no deficits seen on exam. She was also noted to report daily alcohol use. She had a migraine on hospital admission treated with migraine cocktail.    PAST MEDICAL HISTORY: Past Medical History:  Diagnosis Date   Abdominal bloating 08/12/2022   Acquired hypothyroidism 12/23/2021   Acute respiratory failure with hypoxemia 04/20/2014   Asthma 08/14/2015   Ataxia 12/23/2021   Capsulitis of metatarsophalangeal (MTP) joint of left foot 01/22/2018   Cough due to ACE inhibitor 09/30/2021   Daytime somnolence 12/23/2021   Essential hypertension 04/15/2014   External hemorrhoid 11/04/2022   GERD (gastroesophageal reflux disease)    Hearing loss of left ear due to cerumen impaction 10/25/2021   Imbalance 07/11/2022   Lacunar infarction 01/10/2021   right dorsal pons   Left tibial fracture 08/03/2019   Major depressive disorder 01/21/2022   Mixed hyperlipidemia 02/17/2022   Nocturia 09/30/2021   Obstructive sleep apnea    No CPAP   Primary localized osteoarthrosis, lower leg 04/18/2014   Snoring 09/30/2021   Tremor 06/29/2022   Type 1 diabetes mellitus with hyperglycemia 09/30/2021   Urine WBC increased 07/11/2022    MEDICATIONS: Current Outpatient Medications on File Prior to Visit  Medication Sig Dispense Refill   alendronate  (FOSAMAX ) 70 MG tablet Take 1 tablet (70 mg total) by mouth once a week.     amLODipine  (NORVASC ) 10 MG tablet TAKE 1 TABLET BY MOUTH EVERY DAY 90 tablet 0   aspirin  EC 81 MG tablet Take 81 mg by mouth daily.     calcium  carbonate (OSCAL) 1500 (600 Ca) MG TABS tablet Take 600 mg of elemental calcium  by mouth daily with breakfast.     cholecalciferol  (VITAMIN D3) 25 MCG (1000 UNIT) tablet Take 1,000 Units by mouth daily.     Continuous Glucose Receiver (DEXCOM G6 RECEIVER) DEVI E11.21 Use as  directed 1 each 0   Continuous Glucose Sensor (DEXCOM G6 SENSOR) MISC E11.21 Change sensor every 10 days 3 each 2   docusate sodium  (COLACE) 100 MG capsule Take 1 capsule (100 mg total) by mouth 2 (two) times daily for 5 days then as directed by MD 100 capsule 0   ferrous sulfate 325 (65 FE) MG tablet Take 325 mg by mouth daily with breakfast.     furosemide  (LASIX ) 20 MG tablet Take 1 tablet (20 mg total) by mouth daily. 30 tablet 3   Glucagon  (GVOKE HYPOPEN  2-PACK) 0.5 MG/0.1ML SOAJ Inject 0.5 mg into the skin daily. As needed for low sugars less than 60 if does not respond oral sugar. 0.2 mL 2   ibuprofen (ADVIL) 600 MG tablet Take 600 mg by mouth every 6 (six) hours as needed.     insulin  aspart (NOVOLOG  FLEXPEN) 100 UNIT/ML FlexPen Inject 2-3 units daily tid 15 mL 1   insulin  aspart (NOVOLOG ) 100 UNIT/ML injection Inject 2-3 Units into the skin 3 (three) times daily before meals. Sliding scale Start with 3 units, for 15 carb's add and additional unit     insulin  degludec (TRESIBA  FLEXTOUCH) 100 UNIT/ML FlexTouch Pen Inject 19 Units into the skin at bedtime.  levothyroxine  (SYNTHROID ) 125 MCG tablet TAKE 1 TABLET BY MOUTH EVERY DAY BEFORE BREAKFAST 90 tablet 1   linaclotide  (LINZESS ) 145 MCG CAPS capsule Take 1 capsule (145 mcg total) by mouth daily before breakfast. 90 capsule 1   LORazepam  (ATIVAN ) 0.5 MG tablet TAKE 1 TABLET BY MOUTH EVERYDAY AT BEDTIME 30 tablet 2   omeprazole  (PRILOSEC) 40 MG capsule TAKE 1 CAPSULE BY MOUTH EVERY DAY 90 capsule 1   OVER THE COUNTER MEDICATION Take 1 tablet by mouth daily. Care 1 stool softener     polyethylene glycol powder (GLYCOLAX /MIRALAX ) 17 GM/SCOOP powder Take 0.5 Containers by mouth daily. 1 Scoop     propranolol  ER (INDERAL  LA) 60 MG 24 hr capsule TAKE 1 CAPSULE BY MOUTH EVERY DAY 90 capsule 1   rosuvastatin  (CRESTOR ) 40 MG tablet TAKE 1 TABLET BY MOUTH EVERY DAY 90 tablet 1   valsartan  (DIOVAN ) 320 MG tablet TAKE 1 TABLET BY MOUTH EVERY DAY 90  tablet 0   venlafaxine  XR (EFFEXOR -XR) 150 MG 24 hr capsule TAKE 1 CAPSULE BY MOUTH EVERY DAY 90 capsule 1   vitamin B-12 (CYANOCOBALAMIN) 100 MCG tablet Take 100 mcg by mouth daily.     No current facility-administered medications on file prior to visit.    ALLERGIES: Allergies  Allergen Reactions   Lisinopril Cough   Zetia  [Ezetimibe ]     CONFUSION    FAMILY HISTORY: Family History  Problem Relation Age of Onset   Cancer Mother        Colon cancer    Dementia Father        mid 79s, unspecified type   Neuropathy Father    Schizophrenia Brother     SOCIAL HISTORY: Social History   Socioeconomic History   Marital status: Single    Spouse name: Not on file   Number of children: 2   Years of education: 16   Highest education level: Bachelor's degree (e.g., BA, AB, BS)  Occupational History   Occupation: Retired    Comment: Runner, broadcasting/film/video  Tobacco Use   Smoking status: Never   Smokeless tobacco: Never  Vaping Use   Vaping status: Never Used  Substance and Sexual Activity   Alcohol use: Not Currently   Drug use: Never   Sexual activity: Not Currently  Other Topics Concern   Not on file  Social History Narrative   Right hand   Lives alone   Drinks caffeine   An apartment building   Social Drivers of Health   Financial Resource Strain: Low Risk  (09/04/2023)   Overall Financial Resource Strain (CARDIA)    Difficulty of Paying Living Expenses: Not hard at all  Food Insecurity: No Food Insecurity (06/08/2024)   Hunger Vital Sign    Worried About Running Out of Food in the Last Year: Never true    Ran Out of Food in the Last Year: Never true  Transportation Needs: No Transportation Needs (06/08/2024)   PRAPARE - Administrator, Civil Service (Medical): No    Lack of Transportation (Non-Medical): No  Physical Activity: Sufficiently Active (09/04/2023)   Exercise Vital Sign    Days of Exercise per Week: 7 days    Minutes of Exercise per Session: 30 min   Stress: No Stress Concern Present (09/04/2023)   [employer] of Occupational Health - Occupational Stress Questionnaire    Feeling of Stress : Not at all  Social Connections: Socially Isolated (07/13/2024)   Social Connection and Isolation Panel    Frequency of  Communication with Friends and Family: Three times a week    Frequency of Social Gatherings with Friends and Family: Three times a week    Attends Religious Services: Never    Active Member of Clubs or Organizations: No    Attends Banker Meetings: Never    Marital Status: Divorced  Catering manager Violence: Not At Risk (06/08/2024)   Humiliation, Afraid, Rape, and Kick questionnaire    Fear of Current or Ex-Partner: No    Emotionally Abused: No    Physically Abused: No    Sexually Abused: No     PHYSICAL EXAM: Vitals:   08/24/24 1545  SpO2: 97%  Orthostatic VS for the past 72 hrs (Last 3 readings):  Orthostatic BP Patient Position BP Location Cuff Size Orthostatic Pulse  08/24/24 1557 152/88 Standing Left Arm Normal 72  08/24/24 1554 132/88 Standing Left Arm Normal 84  08/24/24 1545 152/84 Supine Left Arm Normal 82    General: No acute distress Head:  Normocephalic/atraumatic Skin/Extremities: No rash, no edema Neurological Exam: alert and awake. No aphasia or dysarthria. Fund of knowledge is appropriate.   Attention and concentration are normal.   Cranial nerves: Pupils equal, round. Extraocular movements intact with no nystagmus. Visual fields full.  No facial asymmetry.  Motor: Bulk and tone normal, muscle strength 5/5 throughout with no pronator drift.   Finger to nose, heel to shin testing intact. Normal RAMs.  Gait narrow-based and steady, no ataxia.    IMPRESSION: This is a 73 yo RH woman with a history of hypertension, DM, depression, hyperthyroidism, right dorsal pons stroke with no residual deficits, seen today for evaluation of an episode of dizziness, headache, nausea that occurred in  06/2024. MRI brain in 08/2024 normal, no acute changes, no intracranial abnormalities to cause dizziness. She reports the dizziness was positional, which may be either vestibular etiology or orthostatic. There is a drop in BP today from supine to standing. Her neurological exam is normal. Continue to monitor BP when symptomatic. We discussed brain MRI showing mild chronic microvascular disease, discussed that when these changes are seen on MRI, control of vascular risk factors (BP, cholesterol, glucose) is the key. Continue daily aspirin . Follow-up as needed, call for any changes.    Thank you for allowing me to participate in her care.  Please do not hesitate to call for any questions or concerns.    Darice Shivers, M.D.   CC: Dr. Sherre

## 2024-09-03 ENCOUNTER — Other Ambulatory Visit: Payer: Self-pay | Admitting: Family Medicine

## 2024-09-03 DIAGNOSIS — R600 Localized edema: Secondary | ICD-10-CM

## 2024-09-18 ENCOUNTER — Ambulatory Visit: Payer: Self-pay | Admitting: Family Medicine

## 2024-09-19 ENCOUNTER — Other Ambulatory Visit: Payer: Self-pay | Admitting: Family Medicine

## 2024-09-19 DIAGNOSIS — I1A0 Resistant hypertension: Secondary | ICD-10-CM

## 2024-09-23 ENCOUNTER — Encounter: Payer: Self-pay | Admitting: Family Medicine

## 2024-09-23 ENCOUNTER — Other Ambulatory Visit: Payer: Self-pay | Admitting: Family Medicine

## 2024-10-18 ENCOUNTER — Other Ambulatory Visit: Payer: Self-pay | Admitting: Family Medicine

## 2024-10-18 DIAGNOSIS — E1159 Type 2 diabetes mellitus with other circulatory complications: Secondary | ICD-10-CM

## 2024-10-19 ENCOUNTER — Other Ambulatory Visit (HOSPITAL_BASED_OUTPATIENT_CLINIC_OR_DEPARTMENT_OTHER): Payer: Self-pay | Admitting: Surgical

## 2024-10-19 DIAGNOSIS — M25511 Pain in right shoulder: Secondary | ICD-10-CM

## 2024-10-20 ENCOUNTER — Encounter (HOSPITAL_BASED_OUTPATIENT_CLINIC_OR_DEPARTMENT_OTHER): Payer: Self-pay | Admitting: Sports Medicine

## 2024-10-20 DIAGNOSIS — Z96651 Presence of right artificial knee joint: Secondary | ICD-10-CM

## 2024-10-25 ENCOUNTER — Other Ambulatory Visit: Payer: Self-pay | Admitting: Family Medicine

## 2024-10-27 ENCOUNTER — Inpatient Hospital Stay (INDEPENDENT_AMBULATORY_CARE_PROVIDER_SITE_OTHER): Admission: RE | Admit: 2024-10-27 | Discharge: 2024-10-27 | Attending: Surgical | Admitting: Surgical

## 2024-10-27 DIAGNOSIS — M25511 Pain in right shoulder: Secondary | ICD-10-CM

## 2024-11-19 ENCOUNTER — Other Ambulatory Visit: Payer: Self-pay

## 2024-11-29 ENCOUNTER — Telehealth: Payer: Self-pay | Admitting: Family Medicine

## 2024-11-29 ENCOUNTER — Ambulatory Visit

## 2024-11-29 ENCOUNTER — Other Ambulatory Visit: Payer: Self-pay | Admitting: Orthopedic Surgery

## 2024-11-29 VITALS — BP 130/76 | HR 76 | Temp 98.0°F | Ht 63.0 in | Wt 175.7 lb

## 2024-11-29 DIAGNOSIS — G8929 Other chronic pain: Secondary | ICD-10-CM | POA: Insufficient documentation

## 2024-11-29 DIAGNOSIS — E6609 Other obesity due to excess calories: Secondary | ICD-10-CM | POA: Diagnosis not present

## 2024-11-29 DIAGNOSIS — E039 Hypothyroidism, unspecified: Secondary | ICD-10-CM | POA: Diagnosis not present

## 2024-11-29 DIAGNOSIS — M25511 Pain in right shoulder: Secondary | ICD-10-CM | POA: Diagnosis not present

## 2024-11-29 DIAGNOSIS — E782 Mixed hyperlipidemia: Secondary | ICD-10-CM | POA: Diagnosis not present

## 2024-11-29 DIAGNOSIS — F331 Major depressive disorder, recurrent, moderate: Secondary | ICD-10-CM | POA: Insufficient documentation

## 2024-11-29 DIAGNOSIS — E1065 Type 1 diabetes mellitus with hyperglycemia: Secondary | ICD-10-CM | POA: Diagnosis not present

## 2024-11-29 DIAGNOSIS — E66811 Obesity, class 1: Secondary | ICD-10-CM | POA: Diagnosis not present

## 2024-11-29 DIAGNOSIS — E1059 Type 1 diabetes mellitus with other circulatory complications: Secondary | ICD-10-CM | POA: Diagnosis not present

## 2024-11-29 DIAGNOSIS — Z6831 Body mass index (BMI) 31.0-31.9, adult: Secondary | ICD-10-CM

## 2024-11-29 NOTE — Assessment & Plan Note (Signed)
 [name]

## 2024-11-29 NOTE — Assessment & Plan Note (Signed)
 SABRA

## 2024-11-29 NOTE — Patient Instructions (Signed)
" °  VISIT SUMMARY: Today, we discussed your upcoming right reverse total shoulder arthroplasty and reviewed your overall health status, including diabetes management, cardiovascular health, and other conditions.  YOUR PLAN: CHRONIC RIGHT SHOULDER PAIN: Severe chronic right shoulder pain due to insufficient rotator cuff, scheduled for reverse total shoulder arthroplasty. -Proceed with reverse total shoulder arthroplasty in March. -Ensure A1c is less than 7.8 before surgery.  TYPE 1 DIABETES MELLITUS WITH HYPERGLYCEMIA: Type 1 diabetes with A1c around 8, posing a risk for surgical healing. -Obtain updated A1c in the first week of February. -Continue current diabetes management with Omnipod and Dexcom G6. -Coordinate with endocrinologist for diabetes management.  MIXED HYPERLIPIDEMIA: Managed with statin therapy. Recent cholesterol levels were satisfactory. -Continue statin therapy.  TRIGGER FINGER, LEFT MIDDLE FINGER: Trigger finger in the left middle finger, considering surgery. -Will consider surgical release for left middle finger trigger finger.  CUTANEOUS ABSCESS, RIGHT BUTTOCK: Cutaneous abscess on the right buttock, slightly red with a small opening. Painful but not currently infected. -Monitor for increase in size, pain, or signs of infection. -Contact clinic if abscess becomes hard, hot, or painful.  OBSTRUCTIVE SLEEP APNEA: Managed by sleeping on the left side. No longer uses CPAP due to intolerance. -Continue current management by sleeping on the left side.  GENERAL HEALTH MAINTENANCE: Regular physical activity including biking and chair exercises. No smoking history. Blood pressure well-controlled. No recent cardiac stress test needed due to previous normal echocardiogram. -Continue regular physical activity. -Maintain blood pressure control. -No cardiac stress test needed before surgery.                      Contains text generated by Abridge.                                  Contains text generated by Abridge.   "

## 2024-11-29 NOTE — Progress Notes (Unsigned)
 "  Subjective:  Patient ID: [name], female    DOB: 15-Aug-1951  Age: 74 y.o. MRN: [id number]  Chief Complaint  Patient presents with   surgical clearance    HPI: Discussed the use of AI scribe software for clinical note transcription with the patient, who gave verbal consent to proceed.  Discussed the use of AI scribe software for clinical note transcription with the patient, who gave verbal consent to proceed.  History of Present Illness   [name] is a 74 year old female who presents for pre-operative evaluation for right reverse total shoulder arthroplasty.  Right shoulder pain and dysfunction - Severe, persistent pain involving the entire right arm due to minimal rotator cuff presence - Movement exacerbates pain - Markedly limited function of the right shoulder - Scheduled for right reverse total shoulder arthroplasty - History of left shoulder arthroplasty  Diabetes mellitus type 1 - Managed with Omnipod insulin  pump and Dexcom G6 monitor - Glycemic control is challenging; last known HbA1c approximately 8 - Endocrinology follow-up every three months for blood work - Adverse effects of steroids on blood sugar; avoids steroid injections  Lower extremity edema - Leg swelling present, improves in the morning - Previously tried compression socks but discontinued - No history of heart failure or myocardial infarction  Cerebrovascular disease and cardiovascular risk - History of small stroke - Takes statin for cholesterol management - Blood pressure well-controlled on three antihypertensive medications  Dermatologic findings - Recent eruption on right buttock: painful, erythematous, approximately the size of a fifty-cent piece with a small opening - History of squamous cell carcinoma - Recent biopsy performed on a suspicious mole  Sleep apnea - Diagnosed with sleep apnea - Unable to tolerate CPAP therapy - Manages symptoms by sleeping on left  side  Musculoskeletal symptoms - Trigger finger in left middle finger - Previous surgery for trigger finger on another finger - Arthritis in right hip - Issues with left foot affecting ambulation  Functional status and mobility - Maintains active lifestyle, drives and performs daily activities - Previously walked extensively; currently bikes at gym for 30 minutes daily - Uses stroller for balance when walking            11/29/2024    2:46 PM 07/13/2024   10:32 AM 04/05/2024   10:34 AM 09/30/2023   10:30 AM 09/04/2023    3:21 PM  Depression screen PHQ 2/9  Decreased Interest 0 0 0 0 0  Down, Depressed, Hopeless 0 0 0 0 0  PHQ - 2 Score 0 0 0 0 0  Altered sleeping 0 0 0 0 0  Tired, decreased energy 0 0 0 0 0  Change in appetite 0 0 0 0 0  Feeling bad or failure about yourself  0 0 0 0 0  Trouble concentrating 0 0 0 0 0  Moving slowly or fidgety/restless 0 0 0 0 0  Suicidal thoughts 0 0 0 0 0  PHQ-9 Score 0 0  0  0  0   Difficult doing work/chores Not difficult at all Not difficult at all  Not difficult at all Not difficult at all     Data saved with a previous flowsheet row definition        11/29/2024    2:46 PM  Fall Risk   Falls in the past year? 0  Number falls in past yr: 0  Injury with Fall? 0  Risk for fall due to : No Fall Risks  Follow up Falls  evaluation completed    Patient Care Team: Sherre Clapper, MD as PCP - General (Family Medicine) Monetta Redell PARAS, MD as PCP - Cardiology (Cardiology) Georjean Darice HERO, MD as Consulting Physician (Neurology) Solum, Therisa HERO, MD as Physician Assistant (Endocrinology) Burt Fus, DPM as Referring Physician (Podiatry) Russell, Paula E, MD as Consulting Physician (Ophthalmology)   Review of Systems  Constitutional:  Negative for chills, fatigue and fever.  HENT:  Negative for congestion, ear pain, sinus pressure and sore throat.   Respiratory:  Negative for cough and shortness of breath.   Cardiovascular:  Negative for  chest pain.  Gastrointestinal:  Negative for abdominal pain, constipation, diarrhea, nausea and vomiting.  Genitourinary:  Negative for dysuria and frequency.  Musculoskeletal:  Positive for arthralgias. Negative for back pain and myalgias.  Neurological:  Negative for dizziness and headaches.  Psychiatric/Behavioral:  Negative for dysphoric mood. The patient is not nervous/anxious.     Medications Ordered Prior to Encounter[1] Past Medical History:  Diagnosis Date   Abdominal bloating 08/12/2022   Acquired hypothyroidism 12/23/2021   Acute respiratory failure with hypoxemia 04/20/2014   Asthma 08/14/2015   Ataxia 12/23/2021   Capsulitis of metatarsophalangeal (MTP) joint of left foot 01/22/2018   Cough due to ACE inhibitor 09/30/2021   Daytime somnolence 12/23/2021   Essential hypertension 04/15/2014   External hemorrhoid 11/04/2022   GERD (gastroesophageal reflux disease)    Hearing loss of left ear due to cerumen impaction 10/25/2021   Imbalance 07/11/2022   Lacunar infarction 01/10/2021   right dorsal pons   Left tibial fracture 08/03/2019   Major depressive disorder 01/21/2022   Mixed hyperlipidemia 02/17/2022   Nocturia 09/30/2021   Obstructive sleep apnea    No CPAP   Primary localized osteoarthrosis, lower leg 04/18/2014   Snoring 09/30/2021   Tremor 06/29/2022   Type 1 diabetes mellitus with hyperglycemia 09/30/2021   Urine WBC increased 07/11/2022   Past Surgical History:  Procedure Laterality Date   APPENDECTOMY     APPLICATION OF WOUND VAC Left 08/03/2019   Procedure: Application Of Wound Vac;  Surgeon: Celena Sharper, MD;  [facility] OR;  Service: Orthopedics;  Laterality: Left;   broken elbow Left 08/2023   CESAREAN SECTION     ESOPHAGOGASTRODUODENOSCOPY (EGD) WITH PROPOFOL  N/A 02/02/2016   Procedure: ESOPHAGOGASTRODUODENOSCOPY (EGD) WITH PROPOFOL ;  Surgeon: Donnice Vaughn Manes, MD;  Location: [facility] ENDOSCOPY;  Service: Endoscopy;  Laterality: N/A;   HAND  SURGERY     JOINT REPLACEMENT Bilateral    Knee    ORIF ANKLE FRACTURE Left 08/03/2019   with wound vac applied    ORIF ANKLE FRACTURE Left 08/03/2019   Procedure: OPEN REDUCTION INTERNAL FIXATION (ORIF) ANKLE FRACTURE;  Surgeon: Celena Sharper, MD;  Location: [facility] OR;  Service: Orthopedics;  Laterality: Left;   ORIF HUMERUS FRACTURE Left 08/14/2023   Procedure: OPEN REDUCTION INTERNAL FIXATION (ORIF) DISTAL HUMERUS FRACTURE WITH OLECRANON OSTEOTOMY;  Surgeon: Celena Sharper, MD;  Location: [facility] OR;  Service: Orthopedics;  Laterality: Left;   TIBIALIS TENDON TRANSFER / REPAIR     TOTAL SHOULDER REPLACEMENT      Family History  Problem Relation Age of Onset   Cancer Mother        Colon cancer    Dementia Father        mid 81s, unspecified type   Neuropathy Father    Schizophrenia Brother    Social History   Socioeconomic History   Marital status: Single    Spouse name: Not on file  Number of children: 2   Years of education: 16   Highest education level: Bachelor's degree (e.g., BA, AB, BS)  Occupational History   Occupation: Retired    Comment: Runner, Broadcasting/film/video  Tobacco Use   Smoking status: Never   Smokeless tobacco: Never  Vaping Use   Vaping status: Never Used  Substance and Sexual Activity   Alcohol use: Not Currently   Drug use: Never   Sexual activity: Not Currently  Other Topics Concern   Not on file  Social History Narrative   Right hand   Lives alone   Drinks caffeine   An apartment building   Social Drivers of Health   Tobacco Use: Low Risk (11/29/2024)   Patient History    Smoking Tobacco Use: Never    Smokeless Tobacco Use: Never    Passive Exposure: Not on file  Financial Resource Strain: Low Risk (09/04/2023)   Overall Financial Resource Strain (CARDIA)    Difficulty of Paying Living Expenses: Not hard at all  Food Insecurity: No Food Insecurity (06/08/2024)   Epic    Worried About Radiation Protection Practitioner of Food in the Last Year: Never true    Ran Out of Food in the  Last Year: Never true  Transportation Needs: No Transportation Needs (06/08/2024)   Epic    Lack of Transportation (Medical): No    Lack of Transportation (Non-Medical): No  Physical Activity: Sufficiently Active (09/04/2023)   Exercise Vital Sign    Days of Exercise per Week: 7 days    Minutes of Exercise per Session: 30 min  Stress: No Stress Concern Present (09/04/2023)   [employer] of Occupational Health - Occupational Stress Questionnaire    Feeling of Stress : Not at all  Social Connections: Socially Isolated (07/13/2024)   Social Connection and Isolation Panel    Frequency of Communication with Friends and Family: Three times a week    Frequency of Social Gatherings with Friends and Family: Three times a week    Attends Religious Services: Never    Active Member of Clubs or Organizations: No    Attends Banker Meetings: Never    Marital Status: Divorced  Depression (PHQ2-9): Low Risk (11/29/2024)   Depression (PHQ2-9)    PHQ-2 Score: 0  Alcohol Screen: Low Risk (11/04/2022)   Alcohol Screen    Last Alcohol Screening Score (AUDIT): 6  Housing: Low Risk (07/13/2024)   Epic    Unable to Pay for Housing in the Last Year: No    Number of Times Moved in the Last Year: 0    Homeless in the Last Year: No  Utilities: Not At Risk (06/08/2024)   Epic    Threatened with loss of utilities: No  Health Literacy: Adequate Health Literacy (09/04/2023)   B1300 Health Literacy    Frequency of need for help with medical instructions: Never    Objective:  BP 130/76   Pulse 76   Temp 98 F (36.7 C)   Ht 5' 3 (1.6 m)   Wt 175 lb 11.2 oz (79.7 kg)   SpO2 96%   BMI 31.12 kg/m      11/29/2024    2:41 PM 08/24/2024    3:45 PM 07/13/2024   10:30 AM  BP/Weight  Systolic BP 130  873  Diastolic BP 76  70  Wt. (Lbs) 175.7 178 176  BMI 31.12 kg/m2 31.53 kg/m2 30.21 kg/m2    Physical Exam Vitals and nursing note reviewed.  Constitutional:      Appearance:  She is  obese.  HENT:     Head: Normocephalic and atraumatic.  Cardiovascular:     Rate and Rhythm: Normal rate and regular rhythm.  Pulmonary:     Effort: Pulmonary effort is normal.     Breath sounds: Normal breath sounds.  Musculoskeletal:     Comments: ROM of the right shoulder limited.   Neurological:     Mental Status: She is alert.         Lab Results  Component Value Date   WBC 6.1 06/08/2024   HGB 12.7 06/08/2024   HCT 39.0 06/08/2024   PLT 284 06/08/2024   GLUCOSE 121 (H) 07/30/2024   CHOL 188 04/05/2024   TRIG 85 04/05/2024   HDL 84 04/05/2024   LDLCALC 89 04/05/2024   ALT 16 07/30/2024   AST 18 07/30/2024   NA 140 07/30/2024   K 5.1 07/30/2024   CL 100 07/30/2024   CREATININE 0.85 07/30/2024   BUN 13 07/30/2024   CO2 24 07/30/2024   TSH 4.500 06/08/2024   INR 0.9 08/14/2023   HGBA1C 8.0 06/22/2024    Results for orders placed or performed in visit on 09/23/24  Hemoglobin A1c   Collection Time: 06/22/24 12:00 AM  Result Value Ref Range   Hemoglobin A1C 8.0   .  Assessment & Plan:   Assessment & Plan Chronic right shoulder pain Chronic right shoulder pain Severe chronic right shoulder pain due to insufficient rotator cuff for anatomical total shoulder arthroplasty. Scheduled for reverse total shoulder arthroplasty. Pain affects the entire arm. Surgery is outpatient with general anesthesia, slightly increasing risk but manageable due to her activity level and functional capacity above 4-5 METS.  Explained to her that Uncontrolled diabetes may affect healing - Proceed with reverse total shoulder arthroplasty in March with LOW TO MODERATE RISK  - Ensure A1c is less than 7.8 before surgery   her Charlanne perioperative risk is 0.2 % Risk of myocardial infarction or cardiac arrest, intraoperatively or up to 30 days post-op  Her REVISED CARDIAC RISK INDEX SCORE IS 2 points RCRI Score, 5 % Risk of major cardiac event Uncontrolled type 1 diabetes mellitus with  hyperglycemia (HCC) Type 1 diabetes mellitus with hyperglycemia with current long term use of insulin .  Type 1 diabetes with A1c around 8. Uncontrolled diabetes poses a risk for surgical healing. Uses Omnipod and Dexcom G6 for glucose management. Endocrinologist involved in care. - Obtain updated A1c in the first week of February - Continue current diabetes management with Omnipod and Dexcom G6 - Coordinate with endocrinologist for diabetes management    Class 1 obesity due to excess calories with serious comorbidity and body mass index (BMI) of 31.0 to 31.9 in adult Has comorbidities of diabetes type 1, hyperlipidemia and major depression. Eats pretty healthy. Moves around but unable to actively exercise due to arthralgias. Advised to keep working on healthy lifestyle and weight management.     Mixed hyperlipidemia Mixed hyperlipidemia Managed with statin therapy- CRESTOR  40 MG DAILY.  Recent cholesterol levels were satisfactory. - Continue statin therapy    Acquired hypothyroidism Takes levothyroxine  125 mcg daily.     Depression, major, recurrent, moderate (HCC) Stable, well managed with Effexor  XR 150 mg daily,     Type 1 diabetes mellitus with other circulatory complications (HCC) As above.     Trigger finger, left middle finger Trigger finger in the left middle finger. Previous surgery for right hand trigger finger due to steroid-induced hyperglycemia. Considering surgery for left hand. -  Will consider surgical release for left middle finger trigger finger  Cutaneous abscess, right buttock Cutaneous abscess on the right buttock, slightly red with a small opening. Painful but not currently infected.  - Monitor for increase in size, pain, or signs of infection - Contact clinic if abscess becomes hard, hot, or painful  Obstructive sleep apnea Managed by sleeping on the left side. No longer uses CPAP due to intolerance. STRONGLY STRESSED ON THE IMPORTANCE OF USING THE  CPAP MACHINE TO HELP MANAGE SLEEP APNEA AND FOR IMPROVING OVERALL HEALTH.   General health maintenance Regular physical activity including biking and chair exercises. No smoking history. Blood pressure well-controlled. No recent cardiac stress test needed due to previous normal echocardiogram. - Continue regular physical activity - Maintain blood pressure control - No cardiac stress test needed before surgery         Body mass index is 31.12 kg/m.        No orders of the defined types were placed in this encounter.   No orders of the defined types were placed in this encounter.      Follow-up: No follow-ups on file.  An After Visit Summary was printed and given to the patient.  Prashant Glosser, MD [facility] [phone]     [1]  Current Outpatient Medications on File Prior to Visit  Medication Sig Dispense Refill   amLODipine  (NORVASC ) 10 MG tablet TAKE 1 TABLET BY MOUTH EVERY DAY 90 tablet 0   aspirin  EC 81 MG tablet Take 81 mg by mouth daily.     calcium  carbonate (OSCAL) 1500 (600 Ca) MG TABS tablet Take 600 mg of elemental calcium  by mouth daily with breakfast.     cholecalciferol  (VITAMIN D3) 25 MCG (1000 UNIT) tablet Take 1,000 Units by mouth daily.     Continuous Glucose Receiver (DEXCOM G6 RECEIVER) DEVI E11.21 Use as directed 1 each 0   Continuous Glucose Sensor (DEXCOM G6 SENSOR) MISC E11.21 Change sensor every 10 days 3 each 2   Glucagon  (GVOKE HYPOPEN  2-PACK) 0.5 MG/0.1ML SOAJ Inject 0.5 mg into the skin daily. As needed for low sugars less than 60 if does not respond oral sugar. 0.2 mL 2   ibuprofen (ADVIL) 600 MG tablet Take 600 mg by mouth every 6 (six) hours as needed.     insulin  aspart (NOVOLOG ) 100 UNIT/ML injection Inject 2-3 Units into the skin 3 (three) times daily before meals. Sliding scale Start with 3 units, for 15 carb's add and additional unit     Insulin  Disposable Pump (OMNIPOD 5 DEXG7G6 PODS GEN 5) MISC SMARTSIG:1 SUB-Q  Every 3 Days     levothyroxine  (SYNTHROID ) 125 MCG tablet TAKE 1 TABLET BY MOUTH EVERY DAY BEFORE BREAKFAST 90 tablet 1   linaclotide  (LINZESS ) 145 MCG CAPS capsule Take 1 capsule (145 mcg total) by mouth daily before breakfast. 90 capsule 1   LORazepam  (ATIVAN ) 0.5 MG tablet TAKE 1 TABLET BY MOUTH EVERYDAY AT BEDTIME 30 tablet 2   omeprazole  (PRILOSEC) 40 MG capsule TAKE 1 CAPSULE BY MOUTH EVERY DAY 90 capsule 1   OVER THE COUNTER MEDICATION Take 1 tablet by mouth daily. Care 1 stool softener     polyethylene glycol powder (GLYCOLAX /MIRALAX ) 17 GM/SCOOP powder Take 0.5 Containers by mouth daily. 1 Scoop     propranolol  ER (INDERAL  LA) 60 MG 24 hr capsule TAKE 1 CAPSULE BY MOUTH EVERY DAY 90 capsule 1   rosuvastatin  (CRESTOR ) 40 MG tablet TAKE 1 TABLET BY MOUTH EVERY DAY  90 tablet 1   valsartan  (DIOVAN ) 320 MG tablet TAKE 1 TABLET BY MOUTH EVERY DAY 90 tablet 0   venlafaxine  XR (EFFEXOR -XR) 150 MG 24 hr capsule TAKE 1 CAPSULE BY MOUTH EVERY DAY 90 capsule 1   vitamin B-12 (CYANOCOBALAMIN) 100 MCG tablet Take 100 mcg by mouth daily.     ferrous sulfate 325 (65 FE) MG tablet Take 325 mg by mouth daily with breakfast. (Patient not taking: Reported on 11/29/2024)     No current facility-administered medications on file prior to visit.   "

## 2024-11-29 NOTE — Telephone Encounter (Signed)
 Called and left a voicemail for patient to call the office to get an appointment scheduled for a surgery clearance that we received forms for from [facility]. The forms are downstairs in the forms to be picked up drawer until the appointment is scheduled.

## 2024-11-29 NOTE — Assessment & Plan Note (Signed)
 Type 1 diabetes mellitus with hyperglycemia with current long term use of insulin .  Type 1 diabetes with A1c around 8. Uncontrolled diabetes poses a risk for surgical healing. Uses Omnipod and Dexcom G6 for glucose management. Endocrinologist involved in care. - Obtain updated A1c in the first week of February - Continue current diabetes management with Omnipod and Dexcom G6 - Coordinate with endocrinologist for diabetes management

## 2024-11-30 ENCOUNTER — Ambulatory Visit: Payer: Self-pay

## 2024-11-30 NOTE — Telephone Encounter (Signed)
 FYI Only or Action Required?: FYI only for provider: Patient going to UC to get tested for the Flu.  Patient was last seen in primary care on 11/29/2024 by Sirivol, Mamatha, MD.  Called Nurse Triage reporting Vomiting.  Symptoms began today.  Interventions attempted: Nothing.  Symptoms are: stable.  Triage Disposition: Home Care  Patient/caregiver understands and will follow disposition?: No  Reason for Disposition  MILD vomiting with diarrhea  Answer Assessment - Initial Assessment Questions Unsure if fever is present, denies body aches or chills. Patient mentioned Tamiflu and wanted to get that. Educated patient on that medication. Patient was looking to get tested for flu and stated she will go to UC.   1. VOMITING SEVERITY: How many times have you vomited in the past 24 hours?      1  2. ONSET: When did the vomiting begin?      Few minutes ago  3. ABDOMEN PAIN: Are your having any abdomen pain? If Yes : How bad is it and what does it feel like? (e.g., crampy, dull, intermittent, constant)      Denies  4. DIARRHEA: Is there any diarrhea? If Yes, ask: How many times today?      Once today, loose  5. CONTACTS: Is there anyone else in the family with the same symptoms?      Denies  6. CAUSE: What do you think is causing your vomiting?     Unsure  7. HYDRATION STATUS: Any signs of dehydration? (e.g., dry mouth [not only dry lips], too weak to stand) When did you last urinate?     Denies, eating and drinking normally today  8. OTHER SYMPTOMS: Do you have any other symptoms? (e.g., fever, headache, vertigo, vomiting blood or coffee grounds, recent head injury)     Nausea, headaches, dizzy, weak.  Protocols used: Vomiting-A-AH  Copied from CRM [id number]. Topic: Clinical - Red Word Triage >> Nov 30, 2024  3:40 PM Rachelle R wrote: Red Word that prompted transfer to Nurse Triage: Patient has the flu she just vomited, states she is nauseous, headache,  abdomen pain and she feel terrible. would like to be prescribed Tamiflu if possible.

## 2024-12-01 ENCOUNTER — Other Ambulatory Visit: Payer: Self-pay | Admitting: Family Medicine

## 2024-12-01 DIAGNOSIS — R600 Localized edema: Secondary | ICD-10-CM

## 2024-12-16 ENCOUNTER — Other Ambulatory Visit: Payer: Self-pay

## 2024-12-16 MED ORDER — ACYCLOVIR 800 MG PO TABS: ORAL_TABLET | ORAL | 2 refills | Status: AC

## 2024-12-16 NOTE — Telephone Encounter (Signed)
 Copied from CRM #[id number]. Topic: Clinical - Medication Question >> Dec 16, 2024 11:36 AM Antwanette L wrote: Reason for CRM: The patient is calling to speak with the head nurse. She is requesting a refill of acyclovir  (Zovirax ) 800 mg tablets. This medicine is not listed the patient active medication list. She reports that she uses the medication during outbreaks and currently has no symptoms but would like to have the medication on hand.The patient can be reached at [phone].

## 2024-12-20 ENCOUNTER — Other Ambulatory Visit: Payer: Self-pay

## 2025-01-20 ENCOUNTER — Ambulatory Visit (HOSPITAL_COMMUNITY): Admit: 2025-01-20 | Admitting: Orthopedic Surgery

## 2025-01-20 SURGERY — ARTHROPLASTY, SHOULDER, TOTAL, REVERSE
Anesthesia: Choice | Site: Shoulder | Laterality: Right
